# Patient Record
Sex: Female | Born: 1945 | Race: Black or African American | Hispanic: No | Smoking: Current every day smoker
Health system: Southern US, Community
[De-identification: ages and names within clinical notes are randomized; demographics above are authoritative.]

## PROBLEM LIST (undated history)

## (undated) DIAGNOSIS — I10 Essential (primary) hypertension: Secondary | ICD-10-CM

## (undated) DIAGNOSIS — E119 Type 2 diabetes mellitus without complications: Secondary | ICD-10-CM

## (undated) HISTORY — PX: SKIN GRAFT: SHX250

## (undated) NOTE — *Deleted (*Deleted)
[facility]  Name: Gabriella Acosta  MRN: [id number] DOB: 20-Dec-1945  Chief Complaint/ HPI  Barrie Dunker,  28 y.o. , female presents for their Follow-Up CCM visit with the clinical pharmacist via telephone due to COVID-19 Pandemic.  PCP : [name], FNP  Their chronic conditions include: Hypertension, Hyperlipidemia, Diabetes and Anxiety  Office Visits:  10/20/19 OV: Severely elevated A1c at 14.0.  No medication changes made as of this time.  She is not participating in exercise and not following any sort of diet plan.  07/30/19 AWV and OV: HTN/DM follow up. Does not check BG at home. Refused colonoscopy and Cologuard. Interested in pneumonia vaccine. Referred to Ophthalmology. Labs ordered (HgbA1c, CMP14+EGFR, CBC, TB, lipid panel). Encouraged exercise. Continue current meds. Given prescription for PCV13. Atorvastatin increased from 10mg  daily to 80mg  daily.   Consult Visits:  CCM Encounters: 03/27/19 PharmD: Samples of Januvia provided on 3/22. Attestation form mailed back without application so unable to process, incoming pharmacy team to take over. Rosuvastatin switched to atorvastatin due to cost.   02/18/19 CPhT: Confirmed [employer] received patient application for Januvia patient assistance. Waiting on attestation form that was mailed to patient on 02/03/19. Per PharmD, pt has mailed attestation form back to [employer]. F/U in 10-14 days  02/17/19 PharmD: Medicaid assistance denied. Assisted patient with PAP applications for Januvia and Basaglar.   02/11/19 CPhT: Basaglar patient assistance approved through [employer] until 01/09/20  Medications: Outpatient Encounter Medications as of 11/26/2019  Medication Sig Note  . amLODipine (NORVASC) 5 MG tablet TAKE 1 TABLET(5 MG) BY MOUTH DAILY   . atorvastatin (LIPITOR) 80 MG tablet Take 1 tablet (80 mg total) by mouth daily.   . Blood Glucose Monitoring Suppl DEVI Check blood sugars twice daily. E11.9   .  Continuous Blood Gluc Sensor (FREESTYLE LIBRE 2 SENSOR) MISC Use to check blood sugars daily   . diclofenac sodium (VOLTAREN) 1 % GEL Apply 2 g topically 4 (four) times daily.   . fluconazole (DIFLUCAN) 100 MG tablet Take 1 tablet by mouth today then repeat in 5 days.   [name] glucose blood (ACCU-CHEK GUIDE) test strip Use as instructed   . insulin aspart (FIASP FLEXTOUCH) 100 UNIT/ML FlexTouch Pen Inject into the skin. Use as directed per sliding scale three times daily   . Insulin Glargine (BASAGLAR KWIKPEN) 100 UNIT/ML Inject 30 Units into the skin at bedtime. 03/31/2019: [employer] patient assistance  . Insulin Pen Needle (B-D UF III MINI PEN NEEDLES) 31G X 5 MM MISC USE AS DIRECTED DAILY   . Lancets (ACCU-CHEK SOFT TOUCH) lancets Use as instructed   . magnesium 30 MG tablet Take 30 mg by mouth once a week.   . meloxicam (MOBIC) 15 MG tablet TK 1 T PO QD AS NEEDED WITH FOOD   . niacin (NIASPAN) 500 MG CR tablet Take 1 tablet (500 mg total) by mouth at bedtime.   . potassium chloride (KLOR-CON 10) 10 MEQ tablet Take 1 tablet (10 mEq total) by mouth daily.   . sitaGLIPtin (JANUVIA) 100 MG tablet Take 1 tablet (100 mg total) by mouth daily.   [name] telmisartan (MICARDIS) 20 MG tablet TAKE 1 TABLET(20 MG) BY MOUTH DAILY    No facility-administered encounter medications on file as of 11/26/2019.    Current Diagnosis/Assessment:    Goals Addressed   None     Diabetes   A1c goal <7%  Recent Relevant Labs: Lab Results  Component Value Date/Time   HGBA1C 14.0 (  H) 10/20/2019 10:22 AM   HGBA1C 10.4 (H) 07/30/2019 03:33 PM   MICROALBUR 30 12/10/2018 05:09 PM   MICROALBUR 150 10/31/2017 12:47 PM   Kidney Function Lab Results  Component Value Date/Time   CREATININE 0.67 10/20/2019 10:22 AM   CREATININE 0.59 07/30/2019 03:33 PM   GFRNONAA 88 10/20/2019 10:22 AM   GFRAA 102 10/20/2019 10:22 AM   K 3.3 (L) 10/20/2019 10:22 AM   K 3.5 07/30/2019 03:33 PM   Last diabetic Eye exam:  Lab Results   Component Value Date/Time   HMDIABEYEEXA No Retinopathy 09/02/2019 12:00 AM    Last diabetic Foot exam: 12/10/18  Checking BG: Sometimes Daily  Recent FBG Readings: unavailable  Patient has failed these meds in past: Tresiba, Tradjenta, Ozempic  Patient is currently uncontrolled on the following medications: . Basaglar Kwikpen 30 units at bedtime . Januvia 100mg  daily . Fiasp sliding scale - not currently taking  We discussed:  . Diet extensively o She says she does not eat much, and when she eats it is usually chicken or a hamburger o After further discussion she states she loves bread and eats a lot of it . She has not been checking her blood sugar or using her mealtime insulin because she has been almost out of it and she has not been able to do sliding scale since not checking her sugars o She did report that she has her Freestyle sensor now, just has not put it on and plans to do so today o We discussed her elevated A1c and the need to make changes to her diet including limiting her carbohydrates and sweets . Advised her to check her blood sugars with sensors starting today . Will consult with CCM team to get Fiasp samples and begin PAP so that she is not without insulin . Discussed risk of complications with continued uncontrolled blood sugar.  Plan Continue current medications, Fiasp PAP  Hyperlipidemia   LDL goal < 70  Lipid Panel     Component Value Date/Time   CHOL 257 (H) 07/30/2019 1533   TRIG 863 (HH) 07/30/2019 1533   HDL 34 (L) 07/30/2019 1533   LDLCALC Comment (A) 07/30/2019 1533    Hepatic Function Latest Ref Rng & Units 10/20/2019 07/30/2019 02/04/2019  Total Protein 6.0 - 8.5 g/dL 7.7 8.0 7.3  Albumin 3.7 - 4.7 g/dL 4.5 4.5 4.4  AST 0 - 40 IU/L 18 22 14   ALT 0 - 32 IU/L 21 18 11   Alk Phosphatase 44 - 121 IU/L 185(H) 179(H) 171(H)  Total Bilirubin 0.0 - 1.2 mg/dL 0.3 0.2 <1.1    The 91-YNWG ASCVD risk score Denman George DC Jr., et al., 2013) is: 47.7%    Values used to calculate the score:     Age: 36 years     Sex: Female     Is Non-Hispanic African American: No     Diabetic: Yes     Tobacco smoker: Yes     Systolic Blood Pressure: 136 mmHg     Is BP treated: Yes     HDL Cholesterol: 34 mg/dL     Total Cholesterol: 257 mg/dL   Patient has failed these meds in past: Rosuvastatin Patient is currently uncontrolled on the following medications:  . Atorvastatin 80mg  daily  . Niacin CR 500mg  (NEW) - has not picked up from pharmacy yet  We discussed:  Her TG's are severely elevated, she has not picked up the Niacin recommended yet.  Encouraged her to pick this up.  We discussed diet and risk of elevated LDL and TG's.  Plan Continue current medications, recheck lipid panel    Willa Frater, PharmD Clinical Pharmacist [facility] [phone]

---

## 1997-02-10 ENCOUNTER — Other Ambulatory Visit: Admission: RE | Admit: 1997-02-10 | Discharge: 1997-02-10 | Payer: Self-pay | Admitting: Obstetrics

## 1998-03-12 ENCOUNTER — Emergency Department (HOSPITAL_COMMUNITY): Admission: EM | Admit: 1998-03-12 | Discharge: 1998-03-12 | Payer: Self-pay | Admitting: Emergency Medicine

## 1998-03-12 ENCOUNTER — Encounter: Payer: Self-pay | Admitting: Emergency Medicine

## 1998-04-11 ENCOUNTER — Encounter: Payer: Self-pay | Admitting: Emergency Medicine

## 1998-04-11 ENCOUNTER — Emergency Department (HOSPITAL_COMMUNITY): Admission: EM | Admit: 1998-04-11 | Discharge: 1998-04-11 | Payer: Self-pay | Admitting: Emergency Medicine

## 2000-08-20 ENCOUNTER — Encounter: Payer: Self-pay | Admitting: Emergency Medicine

## 2000-08-20 ENCOUNTER — Emergency Department (HOSPITAL_COMMUNITY): Admission: EM | Admit: 2000-08-20 | Discharge: 2000-08-20 | Payer: Self-pay | Admitting: Emergency Medicine

## 2001-11-28 ENCOUNTER — Emergency Department (HOSPITAL_COMMUNITY): Admission: EM | Admit: 2001-11-28 | Discharge: 2001-11-28 | Payer: Self-pay | Admitting: Emergency Medicine

## 2003-05-09 ENCOUNTER — Emergency Department (HOSPITAL_COMMUNITY): Admission: EM | Admit: 2003-05-09 | Discharge: 2003-05-09 | Payer: Self-pay | Admitting: Emergency Medicine

## 2004-06-12 ENCOUNTER — Emergency Department (HOSPITAL_COMMUNITY): Admission: EM | Admit: 2004-06-12 | Discharge: 2004-06-12 | Payer: Self-pay | Admitting: Emergency Medicine

## 2005-04-12 ENCOUNTER — Ambulatory Visit (HOSPITAL_COMMUNITY): Admission: RE | Admit: 2005-04-12 | Discharge: 2005-04-12 | Payer: Self-pay | Admitting: Obstetrics

## 2007-03-14 ENCOUNTER — Emergency Department (HOSPITAL_COMMUNITY): Admission: EM | Admit: 2007-03-14 | Discharge: 2007-03-14 | Payer: Self-pay | Admitting: Emergency Medicine

## 2010-05-13 ENCOUNTER — Emergency Department (HOSPITAL_COMMUNITY)
Admission: EM | Admit: 2010-05-13 | Discharge: 2010-05-13 | Disposition: A | Discharge status: Home / Self Care | Payer: Self-pay | Attending: Emergency Medicine | Admitting: Emergency Medicine

## 2010-05-13 ENCOUNTER — Emergency Department (HOSPITAL_COMMUNITY): Payer: Self-pay

## 2010-05-13 DIAGNOSIS — S335XXA Sprain of ligaments of lumbar spine, initial encounter: Secondary | ICD-10-CM | POA: Insufficient documentation

## 2010-05-13 DIAGNOSIS — M545 Low back pain, unspecified: Secondary | ICD-10-CM | POA: Insufficient documentation

## 2010-05-13 DIAGNOSIS — X58XXXA Exposure to other specified factors, initial encounter: Secondary | ICD-10-CM | POA: Insufficient documentation

## 2010-06-02 ENCOUNTER — Emergency Department (HOSPITAL_COMMUNITY)
Admission: EM | Admit: 2010-06-02 | Discharge: 2010-06-02 | Disposition: A | Discharge status: Home / Self Care | Payer: No Typology Code available for payment source | Attending: Emergency Medicine | Admitting: Emergency Medicine

## 2010-06-02 DIAGNOSIS — S239XXA Sprain of unspecified parts of thorax, initial encounter: Secondary | ICD-10-CM | POA: Insufficient documentation

## 2010-06-02 DIAGNOSIS — M546 Pain in thoracic spine: Secondary | ICD-10-CM | POA: Insufficient documentation

## 2010-06-02 DIAGNOSIS — M545 Low back pain, unspecified: Secondary | ICD-10-CM | POA: Insufficient documentation

## 2010-06-02 DIAGNOSIS — S335XXA Sprain of ligaments of lumbar spine, initial encounter: Secondary | ICD-10-CM | POA: Insufficient documentation

## 2010-06-05 ENCOUNTER — Ambulatory Visit (INDEPENDENT_AMBULATORY_CARE_PROVIDER_SITE_OTHER): Payer: No Typology Code available for payment source

## 2010-06-05 ENCOUNTER — Inpatient Hospital Stay (INDEPENDENT_AMBULATORY_CARE_PROVIDER_SITE_OTHER)
Admission: RE | Admit: 2010-06-05 | Discharge: 2010-06-05 | Disposition: A | Discharge status: Home / Self Care | Payer: No Typology Code available for payment source | Source: Ambulatory Visit | Attending: Emergency Medicine | Admitting: Emergency Medicine

## 2010-06-05 DIAGNOSIS — S335XXA Sprain of ligaments of lumbar spine, initial encounter: Secondary | ICD-10-CM

## 2010-06-05 DIAGNOSIS — M62838 Other muscle spasm: Secondary | ICD-10-CM

## 2010-06-05 DIAGNOSIS — N390 Urinary tract infection, site not specified: Secondary | ICD-10-CM

## 2010-06-05 LAB — POCT I-STAT, CHEM 8
BUN: 6 mg/dL (ref 6–23)
Calcium, Ion: 1.19 mmol/L (ref 1.12–1.32)
Creatinine, Ser: 0.7 mg/dL (ref 0.4–1.2)
TCO2: 28 mmol/L (ref 0–100)

## 2010-06-05 LAB — POCT URINALYSIS DIP (DEVICE)
Ketones, ur: NEGATIVE mg/dL
Protein, ur: NEGATIVE mg/dL
pH: 5.5 (ref 5.0–8.0)

## 2010-10-03 LAB — I-STAT 8, (EC8 V) (CONVERTED LAB)
Acid-base deficit: 1
TCO2: 26
pCO2, Ven: 43.3 — ABNORMAL LOW
pH, Ven: 7.358 — ABNORMAL HIGH

## 2010-10-03 LAB — DIFFERENTIAL
Basophils Relative: 0
Eosinophils Absolute: 0.1
Neutrophils Relative %: 77

## 2010-10-03 LAB — CBC
MCHC: 34.8
MCV: 88.1
Platelets: 214

## 2010-10-03 LAB — POCT I-STAT CREATININE
Creatinine, Ser: 0.6
Operator id: 288331

## 2010-10-03 LAB — POCT CARDIAC MARKERS
Operator id: 288331
Troponin i, poc: <0.05

## 2011-01-31 ENCOUNTER — Emergency Department (HOSPITAL_COMMUNITY)
Admission: EM | Admit: 2011-01-31 | Discharge: 2011-01-31 | Disposition: A | Discharge status: Home / Self Care | Payer: Self-pay | Attending: Emergency Medicine | Admitting: Emergency Medicine

## 2011-01-31 ENCOUNTER — Encounter (HOSPITAL_COMMUNITY): Payer: Self-pay | Admitting: Emergency Medicine

## 2011-01-31 DIAGNOSIS — Z79899 Other long term (current) drug therapy: Secondary | ICD-10-CM | POA: Insufficient documentation

## 2011-01-31 DIAGNOSIS — R040 Epistaxis: Secondary | ICD-10-CM | POA: Insufficient documentation

## 2011-01-31 DIAGNOSIS — I10 Essential (primary) hypertension: Secondary | ICD-10-CM | POA: Insufficient documentation

## 2011-01-31 MED ORDER — HYDROCHLOROTHIAZIDE 25 MG PO TABS: 25.0000 mg | ORAL_TABLET | Freq: Every day | ORAL | Status: DC

## 2011-01-31 MED ORDER — OXYMETAZOLINE HCL 0.05 % NA SOLN: 2.0000 | Freq: Two times a day (BID) | NASAL | Status: AC | PRN

## 2011-01-31 NOTE — ED Notes (Signed)
Nosebleed controlled. No bleeding at this time

## 2011-01-31 NOTE — ED Notes (Signed)
EDP at bedside  

## 2011-01-31 NOTE — ED Notes (Signed)
Pt hypertensive. Alert and oriented. NAD. Will recheck vitals

## 2011-01-31 NOTE — ED Notes (Signed)
Pt here with nosebleed today; pt sts on and off starting today; pt denies anticoagulants; pt denies hx of hypertension but is hypertensive at present; pt sts passing clots today

## 2011-02-01 NOTE — ED Provider Notes (Signed)
History     CSN: [id number]  Arrival date & time 01/31/11  1553   First MD Initiated Contact with Patient 01/31/11 1603      Chief Complaint  Patient presents with  . Epistaxis  . Hypertension     The history is provided by the patient.   patient reports intermittent left nosebleed that occurred today.  She reports the bleeding seems to be controlled at this time.  She denies use of anticoagulants.  She reports no prior nosebleeds.  She does report that she was told to have blood pressure by year ago but it was thought to be at that time secondary to stress and it was to be rechecked.  She's never had her blood pressure rechecked.  Her blood pressure on arrival to the emergency department was 216/114.  She denies chest pain or shortness breath.  Nothing worsens her symptoms.  Her symptoms have been improved by direct pressure.    History reviewed. No pertinent past medical history.  History reviewed. No pertinent past surgical history.  History reviewed. No pertinent family history.  History  Substance Use Topics  . Smoking status: Current Everyday Smoker  . Smokeless tobacco: Not on file  . Alcohol Use: Yes    OB History    Grav Para Term Preterm Abortions TAB SAB Ect Mult Living                  Review of Systems  HENT: Positive for nosebleeds.   All other systems reviewed and are negative.    Allergies  Review of patient's allergies indicates no known allergies.  Home Medications   Current Outpatient Rx  Name Route Sig Dispense Refill  . HYDROCHLOROTHIAZIDE 25 MG PO TABS Oral Take 1 tablet (25 mg total) by mouth daily. 30 tablet 2  . OXYMETAZOLINE HCL 0.05 % NA SOLN Nasal Place 2 sprays into the nose 2 (two) times daily as needed (nose bleed). 30 mL 0    BP 159/89  Pulse 82  Temp(Src) 98.2 F (36.8 C) (Oral)  Resp 18  SpO2 98%  Physical Exam  Nursing note and vitals reviewed. Constitutional: She is oriented to person, place, and time. She appears  well-developed and well-nourished. No distress.  HENT:  Head: Normocephalic and atraumatic.       Dried blood in the left narrow without active bleeding.  No active bleeding from her right narrow.  Posterior pharynx without evidence of bleeding.  Airway is patent.  Tolerating secretions.  Eyes: EOM are normal.  Neck: Normal range of motion.  Pulmonary/Chest: Effort normal.  Abdominal: She exhibits no distension. There is no tenderness.  Neurological: She is alert and oriented to person, place, and time.  Skin: Skin is warm.  Psychiatric: She has a normal mood and affect. Judgment normal.    ED Course  Procedures (including critical care time)  Labs Reviewed - No data to display No results found.   1. Epistaxis   2. Hypertension       MDM  Her bleeding was controlled with direct pressure.  There is no obvious left nasal epistaxis at this time.  She has no evidence of a posterior nasal bleed.  Ice was applied to her face.  She was observed in the emergency department.  Her blood pressure was elevated on arrival however without treatment it improved.  I do suspect she has some degree of hypertension and thus the patient was started on hydrochlorothiazide she'll follow closely with her primary care  Dr.        Lyanne Co, MD 02/01/11 [phone]

## 2012-04-22 ENCOUNTER — Emergency Department (HOSPITAL_COMMUNITY)
Admission: EM | Admit: 2012-04-22 | Discharge: 2012-04-22 | Disposition: A | Discharge status: Home / Self Care | Payer: PRIVATE HEALTH INSURANCE | Attending: Emergency Medicine | Admitting: Emergency Medicine

## 2012-04-22 ENCOUNTER — Encounter (HOSPITAL_COMMUNITY): Payer: Self-pay

## 2012-04-22 DIAGNOSIS — Z79899 Other long term (current) drug therapy: Secondary | ICD-10-CM | POA: Insufficient documentation

## 2012-04-22 DIAGNOSIS — R040 Epistaxis: Secondary | ICD-10-CM | POA: Insufficient documentation

## 2012-04-22 DIAGNOSIS — J309 Allergic rhinitis, unspecified: Secondary | ICD-10-CM | POA: Insufficient documentation

## 2012-04-22 DIAGNOSIS — F17200 Nicotine dependence, unspecified, uncomplicated: Secondary | ICD-10-CM | POA: Insufficient documentation

## 2012-04-22 DIAGNOSIS — J3489 Other specified disorders of nose and nasal sinuses: Secondary | ICD-10-CM | POA: Insufficient documentation

## 2012-04-22 LAB — BASIC METABOLIC PANEL
CO2: 25 mEq/L (ref 19–32)
Chloride: 101 mEq/L (ref 96–112)
Creatinine, Ser: 0.59 mg/dL (ref 0.50–1.10)
Potassium: 4.4 mEq/L (ref 3.5–5.1)

## 2012-04-22 LAB — CBC WITH DIFFERENTIAL/PLATELET
Basophils Absolute: 0.1 10*3/uL (ref 0.0–0.1)
HCT: 39 % (ref 36.0–46.0)
Hemoglobin: 13.8 g/dL (ref 12.0–15.0)
Lymphocytes Relative: 21 % (ref 12–46)
Monocytes Absolute: 0.8 10*3/uL (ref 0.1–1.0)
Neutro Abs: 9.5 10*3/uL — ABNORMAL HIGH (ref 1.7–7.7)
RDW: 13.7 % (ref 11.5–15.5)
WBC: 13.4 10*3/uL — ABNORMAL HIGH (ref 4.0–10.5)

## 2012-04-22 NOTE — ED Notes (Signed)
Intermittent nose bleed since yesterday,  Began yesterday morning bled for 10 minutes and  Then pt. Got it to stope , again last night for about 10 minutes.  Twice today.   Not active bleeding noted at present time,

## 2012-04-22 NOTE — ED Provider Notes (Signed)
History    This chart was scribed for non-physician practitioner Junious Silk, PA-C working with Gwyneth Sprout, MD by Gerlean Ren, ED Scribe. This patient was seen in room TR09C/TR09C and the patient's care was started at 3:35 PM.   CSN: [id number]  Arrival date & time 04/22/12  1320   None     Chief Complaint  Patient presents with  . Epistaxis     The history is provided by the patient. No language interpreter was used.  Gabriella Acosta is a 67 y.o. female who presents to the Emergency Department complaining of epistaxis with sudden onset and constant for the past 15-20 minutes that has currently resolved.  Pt reports epistaxis first onset yesterday morning that has intermittently started and resolved lasting between 10-20 minutes each time, approximately 4 separate episodes since first onset.  Pt reports h/o epistaxis last year for which she received medical attention but had no long-term treatment.  Pt reports that she was given saline spray last time but reports that she had a negative reaction and was told not to use the spray again.  Pt denies nausea, emesis, cough, fever, fatigue, shortness of breath.  Pt reports seasonal allergies at this time causing some congestion for which pt has been blowing her nose very frequently.  Blood has drained into throat causing pt to spit out blood.  No blood thinners.   History reviewed. No pertinent past medical history.  History reviewed. No pertinent past surgical history.  No family history on file.  History  Substance Use Topics  . Smoking status: Current Every Day Smoker  . Smokeless tobacco: Not on file  . Alcohol Use: Yes     Comment: occassional    No OB history provided.   Review of Systems  Constitutional: Negative for fatigue.  HENT: Positive for nosebleeds and congestion.   Respiratory: Negative for shortness of breath.   Gastrointestinal: Negative for nausea and vomiting.  All other systems reviewed and are  negative.    Allergies  Review of patient's allergies indicates no known allergies.  Home Medications   Current Outpatient Rx  Name  Route  Sig  Dispense  Refill  . cetirizine (ZYRTEC) 10 MG tablet   Oral   Take 10 mg by mouth daily.         . Multiple Vitamins-Minerals (MULTIVITAMINS THER. W/MINERALS) TABS   Oral   Take 1 tablet by mouth daily.           BP 177/92  Pulse 94  Temp(Src) 98.4 F (36.9 C) (Oral)  Resp 20  SpO2 98%  Physical Exam  Nursing note and vitals reviewed. Constitutional: She is oriented to person, place, and time. She appears well-developed and well-nourished. No distress.  HENT:  Head: Normocephalic and atraumatic.  Right Ear: External ear normal.  Left Ear: External ear normal.  Mouth/Throat: Oropharynx is clear and moist.  Bilateral TM normal, blood in bilateral nares  Eyes: Conjunctivae are normal. Pupils are equal, round, and reactive to light.  Neck: Normal range of motion.  Cardiovascular: Normal rate, regular rhythm and normal heart sounds.   Pulmonary/Chest: Effort normal and breath sounds normal. No stridor. No respiratory distress. She has no wheezes. She has no rales.  Abdominal: Soft. She exhibits no distension.  Musculoskeletal: Normal range of motion.  Neurological: She is alert and oriented to person, place, and time. She has normal strength.  Skin: Skin is warm and dry. She is not diaphoretic. No erythema.  Psychiatric: She has a  normal mood and affect. Her behavior is normal.    ED Course  Procedures (including critical care time) DIAGNOSTIC STUDIES: Oxygen Saturation is 98% on room air, normal by my interpretation.    COORDINATION OF CARE: 3:41 PM- Discussed orthostatic blood labs. Discussed the use of saline spray for her nose.  Pt understands and agrees with plan.    Labs Reviewed  CBC WITH DIFFERENTIAL - Abnormal; Notable for the following:    WBC 13.4 (*)    Neutro Abs 9.5 (*)    All other components within  normal limits  BASIC METABOLIC PANEL      1. Epistaxis       MDM  Patient presents for recurrent nosebleeds. Hemodynamically stable. No anemia. Orthostatic vital signs normal. Discussed normal saline to moisten nose. She has been congested and blowing nose more. Discussed using an OTC decongestant to hasten rhinorrhea relief. Return precautions given. Vital signs stable for discharge. Patient / Family / Caregiver informed of clinical course, understand medical decision-making process, and agree with plan.      I personally performed the services described in this documentation, which was scribed in my presence. The recorded information has been reviewed and is accurate.    Mora Bellman, PA-C 04/23/12 [phone]

## 2012-04-22 NOTE — ED Notes (Signed)
Some bleeding at present. Pt not holding adequate pressure, just has cloth at the end of her nose. Instructed to hold pressure and not let go for at least 15 minutes.

## 2012-04-23 NOTE — ED Provider Notes (Signed)
Medical screening examination/treatment/procedure(s) were performed by non-physician practitioner and as supervising physician I was immediately available for consultation/collaboration.   Gwyneth Sprout, MD 04/23/12 2149

## 2013-01-25 ENCOUNTER — Encounter (HOSPITAL_COMMUNITY): Payer: Self-pay | Admitting: Emergency Medicine

## 2013-01-25 ENCOUNTER — Emergency Department (HOSPITAL_COMMUNITY): Payer: PRIVATE HEALTH INSURANCE

## 2013-01-25 ENCOUNTER — Emergency Department (HOSPITAL_COMMUNITY)
Admission: EM | Admit: 2013-01-25 | Discharge: 2013-01-25 | Disposition: A | Discharge status: Home / Self Care | Payer: PRIVATE HEALTH INSURANCE | Attending: Emergency Medicine | Admitting: Emergency Medicine

## 2013-01-25 DIAGNOSIS — W19XXXA Unspecified fall, initial encounter: Secondary | ICD-10-CM

## 2013-01-25 DIAGNOSIS — IMO0002 Reserved for concepts with insufficient information to code with codable children: Secondary | ICD-10-CM | POA: Insufficient documentation

## 2013-01-25 DIAGNOSIS — W268XXA Contact with other sharp object(s), not elsewhere classified, initial encounter: Secondary | ICD-10-CM | POA: Insufficient documentation

## 2013-01-25 DIAGNOSIS — S01409A Unspecified open wound of unspecified cheek and temporomandibular area, initial encounter: Secondary | ICD-10-CM | POA: Insufficient documentation

## 2013-01-25 DIAGNOSIS — S199XXA Unspecified injury of neck, initial encounter: Secondary | ICD-10-CM

## 2013-01-25 DIAGNOSIS — F17200 Nicotine dependence, unspecified, uncomplicated: Secondary | ICD-10-CM | POA: Insufficient documentation

## 2013-01-25 DIAGNOSIS — Y9301 Activity, walking, marching and hiking: Secondary | ICD-10-CM | POA: Insufficient documentation

## 2013-01-25 DIAGNOSIS — Z79899 Other long term (current) drug therapy: Secondary | ICD-10-CM | POA: Insufficient documentation

## 2013-01-25 DIAGNOSIS — Y92009 Unspecified place in unspecified non-institutional (private) residence as the place of occurrence of the external cause: Secondary | ICD-10-CM | POA: Insufficient documentation

## 2013-01-25 DIAGNOSIS — S6990XA Unspecified injury of unspecified wrist, hand and finger(s), initial encounter: Secondary | ICD-10-CM | POA: Insufficient documentation

## 2013-01-25 DIAGNOSIS — S0180XA Unspecified open wound of other part of head, initial encounter: Secondary | ICD-10-CM | POA: Insufficient documentation

## 2013-01-25 DIAGNOSIS — I10 Essential (primary) hypertension: Secondary | ICD-10-CM | POA: Insufficient documentation

## 2013-01-25 DIAGNOSIS — S01501A Unspecified open wound of lip, initial encounter: Secondary | ICD-10-CM | POA: Insufficient documentation

## 2013-01-25 DIAGNOSIS — S0181XA Laceration without foreign body of other part of head, initial encounter: Secondary | ICD-10-CM

## 2013-01-25 DIAGNOSIS — R296 Repeated falls: Secondary | ICD-10-CM | POA: Insufficient documentation

## 2013-01-25 DIAGNOSIS — Z23 Encounter for immunization: Secondary | ICD-10-CM | POA: Insufficient documentation

## 2013-01-25 DIAGNOSIS — S0993XA Unspecified injury of face, initial encounter: Secondary | ICD-10-CM | POA: Insufficient documentation

## 2013-01-25 DIAGNOSIS — M79643 Pain in unspecified hand: Secondary | ICD-10-CM

## 2013-01-25 HISTORY — DX: Essential (primary) hypertension: I10

## 2013-01-25 MED ORDER — TRAMADOL HCL 50 MG PO TABS
50.0000 mg | ORAL_TABLET | Freq: Four times a day (QID) | ORAL | Status: DC | PRN
Start: 2013-01-25 — End: 2017-10-31

## 2013-01-25 MED ORDER — TRAMADOL HCL 50 MG PO TABS
50.0000 mg | ORAL_TABLET | Freq: Once | ORAL | Status: AC
  Administered 2013-01-25: 50 mg via ORAL
  Filled 2013-01-25: qty 1

## 2013-01-25 MED ORDER — TETANUS-DIPHTH-ACELL PERTUSSIS 5-2.5-18.5 LF-MCG/0.5 IM SUSP
0.5000 mL | Freq: Once | INTRAMUSCULAR | Status: AC
  Administered 2013-01-25: 0.5 mL via INTRAMUSCULAR
  Filled 2013-01-25: qty 0.5

## 2013-01-25 NOTE — ED Notes (Signed)
Patient was outside grilling and when she was walking to go inside one of the nails in the deck made her fall

## 2013-01-25 NOTE — ED Notes (Signed)
Patient transported to X-ray 

## 2013-01-25 NOTE — ED Notes (Signed)
Patient has 3 lacerations to her face.  1 at the lip, 1 left cheek and 1 to the left temple.  Denies LOC

## 2013-01-25 NOTE — ED Notes (Signed)
Patient also has abrasions to the left hand and left knee  Ice applied to the face

## 2013-01-25 NOTE — ED Provider Notes (Signed)
CSN: [id number]     Arrival date & time 01/25/13  2016 History  This chart was scribed for Santiago Glad, PA, working with Gavin Pound. Oletta Lamas, MD, by Ardelia Mems ED Scribe. This patient was seen in room TR10C/TR10C and the patient's care was started at 8:51 PM.   Chief Complaint  Patient presents with  . Facial Laceration    The history is provided by the patient. No language interpreter was used.    HPI Comments: Gabriella Acosta is a 68 y.o. female who presents to the Emergency Department complaining of lacerations to her left cheek, left temple and upper lip sustained earlier today. She states that she sustained these lacerations when she tripped after her pants were caught on a  raised nail on her deck at home. She states that she was carrying a glass tray which broke into many pieces which she then landed on when she fell on her outstretched arms and her face. She denies LOC pertaining to the fall. She states that she has been able to ambulate normally since the fall. She states that she is not on any blood thinning medications. In addition to her lacerations, she is complaining of left-sided facial pain and left hand pain onset after the fall. She denies nausea, emesis, visual disturbances, neck pain, back pain, extremity pain or any other pain or symptoms. She states that she had 4 beers tonight, but that she does not feel intoxicated.    Past Medical History  Diagnosis Date  . Hypertension    History reviewed. No pertinent past surgical history. History reviewed. No pertinent family history. History  Substance Use Topics  . Smoking status: Current Every Day Smoker  . Smokeless tobacco: Not on file  . Alcohol Use: Yes     Comment: occassional   OB History   Grav Para Term Preterm Abortions TAB SAB Ect Mult Living                 Review of Systems  HENT:       Left-sided facial pain  Eyes: Negative for visual disturbance.  Gastrointestinal: Negative for nausea and  vomiting.  Musculoskeletal: Negative for back pain, gait problem and neck pain.       Left hand pain  Skin: Positive for wound.  Neurological: Negative for syncope.  All other systems reviewed and are negative.   Allergies  Review of patient's allergies indicates no known allergies.  Home Medications   Current Outpatient Rx  Name  Route  Sig  Dispense  Refill  . naproxen (NAPROSYN) 500 MG tablet   Oral   Take 500 mg by mouth daily as needed (pain).         [name] PRESCRIPTION MEDICATION   Oral   Take 1 tablet by mouth daily. For blood pressure         . PRESCRIPTION MEDICATION   Oral   Take 1 tablet by mouth daily. Heart medication          Triage Vitals: BP 143/81  Pulse 134  Temp(Src) 97 F (36.1 C) (Oral)  Resp 20  Ht 5\' 4"  (1.626 m)  Wt 143 lb 12.8 oz (65.227 kg)  BMI 24.67 kg/m2  SpO2 97%  Physical Exam  Nursing note and vitals reviewed. Constitutional: She is oriented to person, place, and time. She appears well-developed and well-nourished. No distress.  HENT:  Head: Normocephalic.  1 cm linear laceration of left side of upper lip, that appears to affect the vermillion  border. 5 mm superficial non gaping laceration of left lower cheek, just lateral to the mouth. 2 cm jagged flap laceration of the left temple area.  Eyes: EOM are normal. Pupils are equal, round, and reactive to light.  Neck: Neck supple. No tracheal deviation present.  Cardiovascular: Normal rate.   Pulmonary/Chest: Effort normal. No respiratory distress.  Musculoskeletal: Normal range of motion. She exhibits tenderness.  Tenderness to palpation along the left 4th metacarpal. Tenderness to palpation of the left wrist. Abrasion to the dorsal aspect of the left wrist and the 3rd and 4th MCPs. No obvious swelling or deformities. 2+ radial pulse. Distal sensation of all fingers of the left hand is intact. Able to ambulate. Full ROM of lower extremities. Small, superficial abrasion of left knee. No  tenderness to palpation of the left knee.   Neurological: She is alert and oriented to person, place, and time. She has normal strength. No cranial nerve deficit or sensory deficit. Gait normal.  Cranial nerves are intact.  Skin: Skin is warm and dry.  Psychiatric: She has a normal mood and affect. Her behavior is normal.    ED Course  Procedures (including critical care time)  DIAGNOSTIC STUDIES: Oxygen Saturation is 97% on RA, normal by my interpretation.    COORDINATION OF CARE: 8:59 PM- Discussed plan to obtain diagnostic radiology. Will update pt's Tetanus vaccination. Pt advised of plan for treatment and pt agrees.  Medications  Tdap (BOOSTRIX) injection 0.5 mL (0.5 mLs Intramuscular Given 01/25/13 2038)   Labs Review Labs Reviewed - No data to display Imaging Review Dg Wrist Complete Left  01/25/2013   CLINICAL DATA:  Fall.  Left wrist pain.  EXAM: LEFT WRIST - COMPLETE 3+ VIEW  COMPARISON:  None.  FINDINGS: The wrist is located. No acute bone or soft tissue abnormalities are evident. The radiocarpal joints are intact.  IMPRESSION: Negative wrist radiographs.   Electronically Signed   By: Gennette Pac M.D.   On: 01/25/2013 21:37   Dg Hand Complete Left  01/25/2013   CLINICAL DATA:  Fall.  Laceration over the dorsum of the hand.  EXAM: LEFT HAND - COMPLETE 3+ VIEW  COMPARISON:  Wrist radiographs of the same day.  FINDINGS: Minimal soft tissue swelling is present over the dorsal aspect of the MCP joints. There is no underlying fracture or radiopaque foreign body. No acute osseous abnormality is present.  IMPRESSION: 1. Mild soft tissue swelling over the dorsum of the MCP joints without an underlying fracture.   Electronically Signed   By: Gennette Pac M.D.   On: 01/25/2013 21:38    EKG Interpretation   None      LACERATION REPAIR Performed by: Santiago Glad Authorized by: Santiago Glad Consent: Verbal consent obtained. Risks and benefits: risks, benefits and  alternatives were discussed Consent given by: patient Patient identity confirmed: provided demographic data Prepped and Draped in normal sterile fashion Wound explored  Laceration Location: left temple  Laceration Length: 2 cm  No Foreign Bodies seen or palpated  Anesthesia: local infiltration  Local anesthetic: lidocaine 2% with epinephrine  Anesthetic total: 3 ml  Irrigation method: syringe Amount of cleaning: standard  Skin closure: 6-0 Nylon  Number of sutures: 6  Technique: simple interrupted  Patient tolerance: Patient tolerated the procedure well with no immediate complications.  LACERATION REPAIR Performed by: Santiago Glad Authorized by: Santiago Glad Consent: Verbal consent obtained. Risks and benefits: risks, benefits and alternatives were discussed Consent given by: patient Patient identity confirmed: provided demographic  data Prepped and Draped in normal sterile fashion Wound explored  Laceration Location: upper lip  Laceration Length: 1 cm  No Foreign Bodies seen or palpated  Anesthesia: local infiltration  Local anesthetic: lidocaine 2% with epinephrine  Anesthetic total: 2 ml  Irrigation method: syringe Amount of cleaning: standard  Skin closure: 6-O Nylon  Number of sutures: 4  Technique: Simple interrupted  Patient tolerance: Patient tolerated the procedure well with no immediate complications.   MDM  No diagnosis found. Patient presenting with facial lacerations and hand pain after a mechanical fall that occurred just prior to arrival.  No LOC.  Patient not on any blood thinning medications.  Normal neurological exam.  Therefore, do not feel that any imaging of head is indicated at this time.  Lacerations repaired with sutures.  Xray of hand and wrist negative.  Patient neurovascularly intact.  Patient ambulating without difficulty.  Patient stable for discharge.  Return precautions given.  I personally performed the services  described in this documentation, which was scribed in my presence. The recorded information has been reviewed and is accurate.      Santiago GladHeather Nanea Jared, PA-C 01/25/13 2351

## 2013-01-26 NOTE — ED Provider Notes (Signed)
Medical screening examination/treatment/procedure(s) were performed by non-physician practitioner and as supervising physician I was immediately available for consultation/collaboration.  EKG Interpretation   None         Kemoni Ortega Y. Verlan Grotz, MD 01/26/13 1917 

## 2013-01-30 ENCOUNTER — Emergency Department (INDEPENDENT_AMBULATORY_CARE_PROVIDER_SITE_OTHER)
Admission: EM | Admit: 2013-01-30 | Discharge: 2013-01-30 | Disposition: A | Discharge status: Home / Self Care | Payer: PRIVATE HEALTH INSURANCE | Source: Home / Self Care | Attending: Family Medicine | Admitting: Family Medicine

## 2013-01-30 DIAGNOSIS — Z4802 Encounter for removal of sutures: Secondary | ICD-10-CM

## 2013-01-30 NOTE — ED Provider Notes (Signed)
CSN: [id number]     Arrival date & time 01/30/13  1710 History   First MD Initiated Contact with Patient 01/30/13 1752     Chief Complaint  Patient presents with  . Suture / Staple Removal   (Consider location/radiation/quality/duration/timing/severity/associated sxs/prior Treatment) Patient is a 68 y.o. female presenting with suture removal. The history is provided by the patient. No language interpreter was used.  Suture / Staple Removal This is a new problem. The current episode started more than 1 week ago. The problem occurs constantly. The problem has not changed since onset.Nothing aggravates the symptoms. Nothing relieves the symptoms. She has tried nothing for the symptoms. The treatment provided no relief.   Pt here for suture removal Past Medical History  Diagnosis Date  . Hypertension    No past surgical history on file. No family history on file. History  Substance Use Topics  . Smoking status: Current Every Day Smoker  . Smokeless tobacco: Not on file  . Alcohol Use: Yes     Comment: occassional   OB History   Grav Para Term Preterm Abortions TAB SAB Ect Mult Living                 Review of Systems  Skin: Positive for wound.  All other systems reviewed and are negative.    Allergies  Review of patient's allergies indicates no known allergies.  Home Medications   Current Outpatient Rx  Name  Route  Sig  Dispense  Refill  . naproxen (NAPROSYN) 500 MG tablet   Oral   Take 500 mg by mouth daily as needed (pain).         [name]. PRESCRIPTION MEDICATION   Oral   Take 1 tablet by mouth daily. For blood pressure         . PRESCRIPTION MEDICATION   Oral   Take 1 tablet by mouth daily. Heart medication         . traMADol (ULTRAM) 50 MG tablet   Oral   Take 1 tablet (50 mg total) by mouth every 6 (six) hours as needed.   15 tablet   0    There were no vitals taken for this visit. Physical Exam  Nursing note and vitals reviewed. Constitutional: She  is oriented to person, place, and time. She appears well-developed and well-nourished.  HENT:  Head: Normocephalic and atraumatic.  Healing laceration left forehead and left upper lip  Neurological: She is alert and oriented to person, place, and time.  Skin: Skin is warm.  Psychiatric: She has a normal mood and affect.    ED Course  Procedures (including critical care time) Labs Review Labs Reviewed - No data to display Imaging Review No results found.  EKG Interpretation    Date/Time:    Ventricular Rate:    PR Interval:    QRS Duration:   QT Interval:    QTC Calculation:   R Axis:     Text Interpretation:            Pt had difficulty tolerating suture removal.  Sutures small and tight.  I attempted removal with loops.  Pt informed me she did not want me to remove sutures because I hurt her.  Another RN will try to remove.    MDM   1. Visit for suture removal        Elson AreasLeslie K Garlene Apperson, PA-C 01/30/13 1810  Lonia SkinnerLeslie K JacksonSofia, PA-C 01/30/13 1906  Lonia SkinnerLeslie K IonaSofia, [place]-C 01/30/13 1911

## 2013-01-30 NOTE — ED Provider Notes (Signed)
Medical screening examination/treatment/procedure(s) were performed by resident physician or non-physician practitioner and as supervising physician I was immediately available for consultation/collaboration.   Blaine Guiffre DOUGLAS MD.   Karan Inclan D Coltrane Tugwell, MD 01/30/13 2004 
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
## 2013-01-30 NOTE — ED Notes (Signed)
C/o suture removal from face and lip

## 2013-07-29 ENCOUNTER — Emergency Department (HOSPITAL_COMMUNITY)
Admission: EM | Admit: 2013-07-29 | Discharge: 2013-07-29 | Disposition: A | Discharge status: Other Acute Inpatient Hospital | Payer: 59 | Attending: Emergency Medicine | Admitting: Emergency Medicine

## 2013-07-29 ENCOUNTER — Encounter (HOSPITAL_COMMUNITY): Payer: Self-pay | Admitting: Emergency Medicine

## 2013-07-29 ENCOUNTER — Emergency Department (HOSPITAL_COMMUNITY): Payer: 59

## 2013-07-29 DIAGNOSIS — T22219A Burn of second degree of unspecified forearm, initial encounter: Secondary | ICD-10-CM | POA: Diagnosis not present

## 2013-07-29 DIAGNOSIS — Y92009 Unspecified place in unspecified non-institutional (private) residence as the place of occurrence of the external cause: Secondary | ICD-10-CM | POA: Diagnosis not present

## 2013-07-29 DIAGNOSIS — T2122XA Burn of second degree of abdominal wall, initial encounter: Secondary | ICD-10-CM | POA: Diagnosis not present

## 2013-07-29 DIAGNOSIS — Z23 Encounter for immunization: Secondary | ICD-10-CM | POA: Insufficient documentation

## 2013-07-29 DIAGNOSIS — Z79899 Other long term (current) drug therapy: Secondary | ICD-10-CM | POA: Insufficient documentation

## 2013-07-29 DIAGNOSIS — T2102XA Burn of unspecified degree of abdominal wall, initial encounter: Secondary | ICD-10-CM

## 2013-07-29 DIAGNOSIS — F1010 Alcohol abuse, uncomplicated: Secondary | ICD-10-CM | POA: Insufficient documentation

## 2013-07-29 DIAGNOSIS — R000 Tachycardia, unspecified: Secondary | ICD-10-CM | POA: Insufficient documentation

## 2013-07-29 DIAGNOSIS — X131XXA Other contact with steam and other hot vapors, initial encounter: Secondary | ICD-10-CM

## 2013-07-29 DIAGNOSIS — T24219A Burn of second degree of unspecified thigh, initial encounter: Secondary | ICD-10-CM | POA: Insufficient documentation

## 2013-07-29 DIAGNOSIS — I10 Essential (primary) hypertension: Secondary | ICD-10-CM | POA: Diagnosis not present

## 2013-07-29 DIAGNOSIS — T23009A Burn of unspecified degree of unspecified hand, unspecified site, initial encounter: Secondary | ICD-10-CM

## 2013-07-29 DIAGNOSIS — X12XXXA Contact with other hot fluids, initial encounter: Secondary | ICD-10-CM | POA: Insufficient documentation

## 2013-07-29 DIAGNOSIS — Y9389 Activity, other specified: Secondary | ICD-10-CM | POA: Insufficient documentation

## 2013-07-29 LAB — ETHANOL: Alcohol, Ethyl (B): 230 mg/dL — ABNORMAL HIGH (ref 0–11)

## 2013-07-29 LAB — PREPARE FRESH FROZEN PLASMA
UNIT DIVISION: 0
Unit division: 0

## 2013-07-29 LAB — CBC
HCT: 36.8 % (ref 36.0–46.0)
Hemoglobin: 12.4 g/dL (ref 12.0–15.0)
MCH: 30.9 pg (ref 26.0–34.0)
MCHC: 33.7 g/dL (ref 30.0–36.0)
MCV: 91.8 fL (ref 78.0–100.0)
PLATELETS: 201 10*3/uL (ref 150–400)
RBC: 4.01 MIL/uL (ref 3.87–5.11)
RDW: 13.4 % (ref 11.5–15.5)
WBC: 12.9 10*3/uL — AB (ref 4.0–10.5)

## 2013-07-29 LAB — TYPE AND SCREEN
ABO/RH(D): A POS
Antibody Screen: NEGATIVE
UNIT DIVISION: 0
Unit division: 0

## 2013-07-29 LAB — COMPREHENSIVE METABOLIC PANEL
ALBUMIN: 3.5 g/dL (ref 3.5–5.2)
ALT: 76 U/L — ABNORMAL HIGH (ref 0–35)
ANION GAP: 20 — AB (ref 5–15)
AST: 70 U/L — ABNORMAL HIGH (ref 0–37)
Alkaline Phosphatase: 112 U/L (ref 39–117)
BUN: 8 mg/dL (ref 6–23)
CO2: 17 mEq/L — ABNORMAL LOW (ref 19–32)
CREATININE: 0.57 mg/dL (ref 0.50–1.10)
Calcium: 8.8 mg/dL (ref 8.4–10.5)
Chloride: 99 mEq/L (ref 96–112)
GFR calc Af Amer: >90 mL/min (ref >90)
Glucose, Bld: 243 mg/dL — ABNORMAL HIGH (ref 70–99)
Potassium: 3.4 mEq/L — ABNORMAL LOW (ref 3.7–5.3)
Sodium: 136 mEq/L — ABNORMAL LOW (ref 137–147)
Total Bilirubin: <0.2 mg/dL — ABNORMAL LOW (ref 0.3–1.2)
Total Protein: 7.4 g/dL (ref 6.0–8.3)

## 2013-07-29 LAB — PROTIME-INR
INR: 1.09 (ref 0.00–1.49)
PROTHROMBIN TIME: 14.1 s (ref 11.6–15.2)

## 2013-07-29 LAB — CDS SEROLOGY

## 2013-07-29 LAB — ABO/RH: ABO/RH(D): A POS

## 2013-07-29 MED ORDER — ONDANSETRON HCL 4 MG/2ML IJ SOLN
4.0000 mg | Freq: Once | INTRAMUSCULAR | Status: AC
  Administered 2013-07-29: 4 mg via INTRAVENOUS

## 2013-07-29 MED ORDER — HYDROMORPHONE HCL PF 1 MG/ML IJ SOLN
1.0000 mg | INTRAMUSCULAR | Status: DC
  Administered 2013-07-29: 1 mg via INTRAVENOUS

## 2013-07-29 MED ORDER — HYDROMORPHONE HCL PF 1 MG/ML IJ SOLN
INTRAMUSCULAR | Status: AC
  Filled 2013-07-29: qty 1

## 2013-07-29 MED ORDER — LACTATED RINGERS IV SOLN
INTRAVENOUS | Status: DC
  Administered 2013-07-29: 22:00:00 via INTRAVENOUS

## 2013-07-29 MED ORDER — HYDROMORPHONE HCL PF 1 MG/ML IJ SOLN
1.0000 mg | Freq: Once | INTRAMUSCULAR | Status: AC
  Administered 2013-07-29: 1 mg via INTRAVENOUS

## 2013-07-29 MED ORDER — TETANUS-DIPHTH-ACELL PERTUSSIS 5-2.5-18.5 LF-MCG/0.5 IM SUSP
0.5000 mL | Freq: Once | INTRAMUSCULAR | Status: AC
  Administered 2013-07-29: 0.5 mL via INTRAMUSCULAR

## 2013-07-29 MED ORDER — LACTATED RINGERS IV BOLUS (SEPSIS)
1000.0000 mL | Freq: Once | INTRAVENOUS | Status: AC
  Administered 2013-07-29: 1000 mL via INTRAVENOUS

## 2013-07-29 MED ORDER — TETANUS-DIPHTH-ACELL PERTUSSIS 5-2.5-18.5 LF-MCG/0.5 IM SUSP
INTRAMUSCULAR | Status: AC
  Filled 2013-07-29: qty 0.5

## 2013-07-29 MED ORDER — FENTANYL CITRATE 0.05 MG/ML IJ SOLN
50.0000 ug | Freq: Once | INTRAMUSCULAR | Status: AC
  Administered 2013-07-29: 50 ug via INTRAVENOUS

## 2013-07-29 MED ORDER — ONDANSETRON HCL 4 MG/2ML IJ SOLN
INTRAMUSCULAR | Status: AC
  Filled 2013-07-29: qty 2

## 2013-07-29 MED ORDER — LACTATED RINGERS IV BOLUS (SEPSIS): 1000.0000 mL | Freq: Once | INTRAVENOUS | Status: DC

## 2013-07-29 NOTE — Progress Notes (Signed)
Chaplain responded to level 1 page.  Pt unavailable but chaplain did escort family to consultation A.  Thereafter, chaplain took family to be with pt at bedside.  Family seemed appreciative of chaplain presence.
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
## 2013-07-30 ENCOUNTER — Encounter (HOSPITAL_COMMUNITY): Payer: Self-pay | Admitting: Emergency Medicine

## 2013-07-30 DIAGNOSIS — F1010 Alcohol abuse, uncomplicated: Secondary | ICD-10-CM | POA: Insufficient documentation

## 2013-07-30 DIAGNOSIS — D72829 Elevated white blood cell count, unspecified: Secondary | ICD-10-CM | POA: Insufficient documentation

## 2013-07-30 DIAGNOSIS — R739 Hyperglycemia, unspecified: Secondary | ICD-10-CM | POA: Insufficient documentation

## 2013-07-30 DIAGNOSIS — R7401 Elevation of levels of liver transaminase levels: Secondary | ICD-10-CM | POA: Insufficient documentation

## 2013-07-30 DIAGNOSIS — F419 Anxiety disorder, unspecified: Secondary | ICD-10-CM | POA: Insufficient documentation

## 2013-07-30 DIAGNOSIS — E440 Moderate protein-calorie malnutrition: Secondary | ICD-10-CM | POA: Insufficient documentation

## 2013-07-30 DIAGNOSIS — R748 Abnormal levels of other serum enzymes: Secondary | ICD-10-CM | POA: Insufficient documentation
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
## 2013-09-09 DIAGNOSIS — T2132XA Burn of third degree of abdominal wall, initial encounter: Secondary | ICD-10-CM | POA: Insufficient documentation

## 2013-09-09 DIAGNOSIS — T2122XD Burn of second degree of abdominal wall, subsequent encounter: Secondary | ICD-10-CM | POA: Insufficient documentation

## 2013-09-09 DIAGNOSIS — T24299A Burn of second degree of multiple sites of unspecified lower limb, except ankle and foot, initial encounter: Secondary | ICD-10-CM | POA: Insufficient documentation

## 2016-12-21 ENCOUNTER — Encounter (HOSPITAL_COMMUNITY): Payer: Self-pay | Admitting: Family Medicine

## 2016-12-21 ENCOUNTER — Ambulatory Visit (HOSPITAL_COMMUNITY)
Admission: EM | Admit: 2016-12-21 | Discharge: 2016-12-21 | Disposition: A | Discharge status: Home / Self Care | Payer: Medicare Other | Attending: Urgent Care | Admitting: Urgent Care

## 2016-12-21 DIAGNOSIS — I10 Essential (primary) hypertension: Secondary | ICD-10-CM | POA: Diagnosis not present

## 2016-12-21 DIAGNOSIS — F17200 Nicotine dependence, unspecified, uncomplicated: Secondary | ICD-10-CM

## 2016-12-21 DIAGNOSIS — R030 Elevated blood-pressure reading, without diagnosis of hypertension: Secondary | ICD-10-CM

## 2016-12-21 MED ORDER — AMLODIPINE BESYLATE 5 MG PO TABS: 5.0000 mg | ORAL_TABLET | Freq: Every day | ORAL | 1 refills | Status: DC

## 2016-12-21 MED ORDER — CLONIDINE HCL 0.1 MG PO TABS
0.2000 mg | ORAL_TABLET | Freq: Once | ORAL | Status: AC
  Administered 2016-12-21: 0.2 mg via ORAL

## 2016-12-21 MED ORDER — CLONIDINE HCL 0.1 MG PO TABS
ORAL_TABLET | ORAL | Status: AC
  Filled 2016-12-21: qty 2

## 2016-12-21 NOTE — ED Provider Notes (Signed)
  MRN: [id number] DOB: 11/20/1945  Subjective:   Gabriella Acosta is a 71 y.o. female presenting for intermittent dizziness for the past week, elevated blood pressure. Has a history of high blood pressure, she is not currently taking any medications. Diet is not compliant. Denies falls, confusion, headaches, chest pain, shortness of breath, dyspnea, heart racing, palpitations, nausea, vomiting, abdominal pain, hematuria, lower leg swelling. Smokes 1/2 ppd. Has 2 beers per night.  Gabriella Acosta is not currently taking any medications and has No Known Allergies.  Gabriella Acosta  has a past medical history of Hypertension. Denies past surgical history.    Objective:   Vitals: BP (!) 208/99   Pulse 78   Temp 98 F (36.7 C)   Resp 18   SpO2 100%   Physical Exam  Constitutional: She is oriented to person, place, and time. She appears well-developed and well-nourished.  HENT:  Mouth/Throat: Oropharynx is clear and moist.  Eyes: EOM are normal. Pupils are equal, round, and reactive to light. Right eye exhibits no discharge. Left eye exhibits no discharge.  Neck: Normal range of motion. Neck supple. No thyromegaly present.  Cardiovascular: Normal rate, regular rhythm and intact distal pulses. Exam reveals no gallop and no friction rub.  No murmur heard. Pulmonary/Chest: No respiratory distress. She has no wheezes. She has no rales.  Abdominal: Soft. Bowel sounds are normal. She exhibits no distension and no mass. There is no tenderness.  Musculoskeletal: She exhibits no edema.  Neurological: She is alert and oriented to person, place, and time. She displays normal reflexes. No cranial nerve deficit. Coordination normal.  Skin: Skin is warm and dry. No rash noted. No erythema. No pallor.  Psychiatric: She has a normal mood and affect.   Assessment and Plan :   Essential hypertension  Elevated blood pressure reading  Tobacco use disorder  Physical exam findings reassuring. Counseled patient  extensively on management of HTN. She will look to establish care for management of her HTN. Otherwise, will start amlodipine 5mg . Recommended lifestyle modifications. Strict ER and rtc precautions discussed, patient verbalized understanding. Follow up in 1 week.  Wallis BambergMario Steele Stracener, PA-C [facility] [phone] 12/21/2016  10:55 AM    Wallis BambergMani, Madailein Londo, PA-C 12/21/16 1157

## 2016-12-21 NOTE — Discharge Instructions (Signed)
Wallis BambergMario Glenford Garis, PA-C [address] [place], [place] [address]  [facility] [phone]  Use Mrs. Dash for seasoning of your foods.   Salads - kale, spinach, cabbage, spring mix; use seeds like pumpkin seeds or sunflower seeds; you can also use 1-2 hard boiled eggs. Fruits - avocadoes, berries (blueberries, raspberries, blackberries), apples, oranges, pomegranate, grapefruit Seeds - quinoa, chia seeds; you can also incorporate oatmeal Vegetables - aspargus, cauliflower, broccoli, green beans, brussel spouts, bell peppers; stay away from starchy vegetables like potatoes, carrots, peas   Brussel sprouts - Cut off stems. Place in a mixing bowl that has a lid. Pour in a 1/4-1/2 cup olive oil, spices, use a light amount of parmesan. Place on a baking sheet. Bake for 10 minutes at 400F. Take it out, eat the brussel chips. Place for another 5-10 minutes.   Mashed cauliflower - Boil a bunch of cauliflower in a pot of water. Blend in a food processor with 1-2 tablespoons of butter.  Spaghetti squash -  Cut the squash in half very carefully, clean out seeds from the middle. Place 1/2 face down in a microwave safe dish with at least 2 inches of water. Make 4-6 slits on outside of spaghetti squash and microwave for 10-12 minutes. Take out the spaghetti using a metal spoon. Repeat for the other half.    Vega protein is good protein powder, make sure you use ~6 ice cubes to give it smoothie consistency together with ~4-6 ounces of vanilla soy milk. Throw cinnamon into your shake, use peanut butter. You can also use the fruits that I listed above. Throw spinach or kale into the shake.

## 2016-12-21 NOTE — ED Triage Notes (Signed)
Pt here for 3 weeks of intermittent BP elevation. Reports that she checks it because of feeling dizzy, and nose bleeding. Hx of HTN but hasn't been on medication in years. Reports levels running around 200 sys each time. Currently a little dizzy. Denies chest pain, SOB,.

## 2016-12-23 ENCOUNTER — Emergency Department (HOSPITAL_COMMUNITY)
Admission: EM | Admit: 2016-12-23 | Discharge: 2016-12-23 | Disposition: A | Discharge status: Home / Self Care | Payer: Medicare Other | Attending: Emergency Medicine | Admitting: Emergency Medicine

## 2016-12-23 ENCOUNTER — Other Ambulatory Visit: Payer: Self-pay

## 2016-12-23 ENCOUNTER — Encounter (HOSPITAL_COMMUNITY): Payer: Self-pay

## 2016-12-23 DIAGNOSIS — Z79899 Other long term (current) drug therapy: Secondary | ICD-10-CM | POA: Diagnosis not present

## 2016-12-23 DIAGNOSIS — I10 Essential (primary) hypertension: Secondary | ICD-10-CM | POA: Insufficient documentation

## 2016-12-23 DIAGNOSIS — R42 Dizziness and giddiness: Secondary | ICD-10-CM | POA: Diagnosis not present

## 2016-12-23 LAB — BASIC METABOLIC PANEL
ANION GAP: 11 (ref 5–15)
BUN: 7 mg/dL (ref 6–20)
CALCIUM: 9.7 mg/dL (ref 8.9–10.3)
CO2: 25 mmol/L (ref 22–32)
Chloride: 101 mmol/L (ref 101–111)
Creatinine, Ser: 0.66 mg/dL (ref 0.44–1.00)
Glucose, Bld: 141 mg/dL — ABNORMAL HIGH (ref 65–99)
POTASSIUM: 4.1 mmol/L (ref 3.5–5.1)
SODIUM: 137 mmol/L (ref 135–145)

## 2016-12-23 LAB — CBC
HCT: 42.4 % (ref 36.0–46.0)
HEMOGLOBIN: 14.4 g/dL (ref 12.0–15.0)
MCH: 30.2 pg (ref 26.0–34.0)
MCHC: 34 g/dL (ref 30.0–36.0)
MCV: 88.9 fL (ref 78.0–100.0)
Platelets: 152 10*3/uL (ref 150–400)
RBC: 4.77 MIL/uL (ref 3.87–5.11)
RDW: 13.5 % (ref 11.5–15.5)
WBC: 8 10*3/uL (ref 4.0–10.5)

## 2016-12-23 MED ORDER — AMLODIPINE BESYLATE 5 MG PO TABS: 10.0000 mg | ORAL_TABLET | Freq: Every day | ORAL | 1 refills | Status: DC

## 2016-12-23 MED ORDER — AMLODIPINE BESYLATE 5 MG PO TABS
5.0000 mg | ORAL_TABLET | Freq: Once | ORAL | Status: AC
  Administered 2016-12-23: 5 mg via ORAL
  Filled 2016-12-23: qty 1

## 2016-12-23 MED ORDER — AMLODIPINE BESYLATE 5 MG PO TABS: 5.0000 mg | ORAL_TABLET | Freq: Every day | ORAL | 1 refills | Status: DC

## 2016-12-23 NOTE — Discharge Instructions (Signed)
Take 2 tablets of the Norvasc in the morning.  Do not take any Norvasc tonight.. Schedule a follow-up visit with Dr. Allyne GeeSanders

## 2016-12-23 NOTE — ED Notes (Signed)
ED Provider at bedside. 

## 2016-12-23 NOTE — ED Triage Notes (Signed)
Patient reports that she has had nosebleed and dizziness since 1st of last week. [facility] and started on amlodipine for her HTN. States that her BP is still very high with ongoing dizziness

## 2016-12-23 NOTE — ED Provider Notes (Signed)
[facility]   CSN: [id number] Arrival date & time: 12/23/16  1235     History   Chief Complaint Chief Complaint  Patient presents with  . Hypertension    HPI Gabriella Acosta is a 71 y.o. female.  71 year old female with increased blood pressure times several days.  Was seen in urgent care and started on Norvasc 2 days ago.  Has intermittent dizziness and she takes her blood pressure and is elevated.  Chest systolic at home up to 200.  No confusion or emesis.  No chest pain or shortness of breath.  No ataxia or focal weakness.  She does drink 6 beers a day as well as wine 2.  Denies any illicit drug use.  Nothing makes her symptoms better.      Past Medical History:  Diagnosis Date  . Hypertension     There are no active problems to display for this patient.   History reviewed. No pertinent surgical history.  OB History    No data available       Home Medications    Prior to Admission medications   Medication Sig Start Date End Date Taking? Authorizing Provider  amLODipine (NORVASC) 5 MG tablet Take 1 tablet (5 mg total) by mouth daily. 12/21/16   Wallis BambergMani, Mario, PA-C  naproxen (NAPROSYN) 500 MG tablet Take 500 mg by mouth daily as needed (pain).    [provider]  PRESCRIPTION MEDICATION Take 1 tablet by mouth daily. For blood pressure    [provider]  PRESCRIPTION MEDICATION Take 1 tablet by mouth daily. Heart medication    [provider]  PRESCRIPTION MEDICATION Take 1 tablet by mouth daily. Blood pressure medication    [provider]  PRESCRIPTION MEDICATION Take 1 tablet by mouth daily. Heart medication    [provider]  traMADol (ULTRAM) 50 MG tablet Take 1 tablet (50 mg total) by mouth every 6 (six) hours as needed. 01/25/13   Santiago GladLaisure, Heather, PA-C    Family History No family history on file.  Social History Social History   Tobacco Use  .  Smoking status: Never Smoker  . Smokeless tobacco: Never Used  Substance Use Topics  . Alcohol use: Yes    Comment: occassional  . Drug use: No     Allergies   Patient has no known allergies.   Review of Systems Review of Systems  All other systems reviewed and are negative.    Physical Exam Updated Vital Signs BP (!) 176/89   Pulse 70   Temp 98.6 F (37 C) (Oral)   Resp 16   Ht 1.626 m (5\' 4" )   Wt 61.7 kg (136 lb)   SpO2 100%   BMI 23.34 kg/m   Physical Exam  Constitutional: She is oriented to person, place, and time. She appears well-developed and well-nourished.  Non-toxic appearance. No distress.  HENT:  Head: Normocephalic and atraumatic.  Eyes: Conjunctivae, EOM and lids are normal. Pupils are equal, round, and reactive to light.  Neck: Normal range of motion. Neck supple. No tracheal deviation present. No thyroid mass present.  Cardiovascular: Normal rate, regular rhythm and normal heart sounds. Exam reveals no gallop.  No murmur heard. Pulmonary/Chest: Effort normal and breath sounds normal. No stridor. No respiratory distress. She has no decreased breath sounds. She has no wheezes. She has no rhonchi. She has no rales.  Abdominal: Soft. Normal appearance and bowel sounds are normal. She exhibits no distension.  There is no tenderness. There is no rebound and no CVA tenderness.  Musculoskeletal: Normal range of motion. She exhibits no edema or tenderness.  Neurological: She is alert and oriented to person, place, and time. She has normal strength. No cranial nerve deficit or sensory deficit. GCS eye subscore is 4. GCS verbal subscore is 5. GCS motor subscore is 6.  Skin: Skin is warm and dry. No abrasion and no rash noted.  Psychiatric: She has a normal mood and affect. Her speech is normal and behavior is normal.  Nursing note and vitals reviewed.    ED Treatments / Results  Labs (all labs ordered are listed, but only abnormal results are displayed) Labs  Reviewed  CBC  BASIC METABOLIC PANEL    EKG  EKG Interpretation  Date/Time:  Saturday December 23 2016 12:46:53 EST Ventricular Rate:  74 PR Interval:  114 QRS Duration: 90 QT Interval:  436 QTC Calculation: 483 R Axis:   70 Text Interpretation:  Normal sinus rhythm Minimal voltage criteria for LVH, may be normal variant Borderline ECG Confirmed by Lorre NickAllen, Surah Pelley ([id number]) on 12/23/2016 1:17:01 PM       Radiology No results found.  Procedures Procedures (including critical care time)  Medications Ordered in ED Medications  amLODipine (NORVASC) tablet 5 mg (not administered)     Initial Impression / Assessment and Plan / ED Course  I have reviewed the triage vital signs and the nursing notes.  Pertinent labs & imaging results that were available during my care of the patient were reviewed by me and considered in my medical decision making (see chart for details).     Patient medicated here with Norvasc repeat blood pressure has improved.  Given instructions on how to take her Norvasc.  Will refer to primary care and return precautions given  Final Clinical Impressions(s) / ED Diagnoses   Final diagnoses:  None    ED Discharge Orders    None       Lorre NickAllen, Zaidan Keeble, MD 12/23/16 1427

## 2017-02-21 ENCOUNTER — Telehealth: Payer: Self-pay | Admitting: *Deleted

## 2017-02-21 NOTE — Telephone Encounter (Signed)
Patient left a msg on the refill vm requesting to cancel her upcoming appointment. She can be reached at [phone]. Thanks, MI

## 2017-02-26 ENCOUNTER — Ambulatory Visit: Payer: Medicare Other | Admitting: Cardiovascular Disease

## 2017-10-31 ENCOUNTER — Ambulatory Visit (INDEPENDENT_AMBULATORY_CARE_PROVIDER_SITE_OTHER): Payer: Medicare Other | Admitting: Internal Medicine

## 2017-10-31 ENCOUNTER — Other Ambulatory Visit: Payer: Self-pay | Admitting: Internal Medicine

## 2017-10-31 ENCOUNTER — Ambulatory Visit: Payer: Medicare Other

## 2017-10-31 VITALS — BP 170/102 | Temp 98.7°F | Ht 64.0 in | Wt 131.0 lb

## 2017-10-31 VITALS — BP 172/102 | HR 75 | Temp 98.7°F | Ht 64.0 in | Wt 131.0 lb

## 2017-10-31 DIAGNOSIS — E2839 Other primary ovarian failure: Secondary | ICD-10-CM

## 2017-10-31 DIAGNOSIS — R809 Proteinuria, unspecified: Secondary | ICD-10-CM

## 2017-10-31 DIAGNOSIS — Z716 Tobacco abuse counseling: Secondary | ICD-10-CM

## 2017-10-31 DIAGNOSIS — Z13820 Encounter for screening for osteoporosis: Secondary | ICD-10-CM

## 2017-10-31 DIAGNOSIS — I10 Essential (primary) hypertension: Secondary | ICD-10-CM | POA: Diagnosis not present

## 2017-10-31 DIAGNOSIS — Z0000 Encounter for general adult medical examination without abnormal findings: Secondary | ICD-10-CM

## 2017-10-31 DIAGNOSIS — R9431 Abnormal electrocardiogram [ECG] [EKG]: Secondary | ICD-10-CM

## 2017-10-31 DIAGNOSIS — Z9114 Patient's other noncompliance with medication regimen: Secondary | ICD-10-CM

## 2017-10-31 LAB — POCT URINALYSIS DIPSTICK
Glucose, UA: NEGATIVE
Ketones, UA: NEGATIVE
Leukocytes, UA: NEGATIVE
NITRITE UA: NEGATIVE
PH UA: 5.5 (ref 5.0–8.0)
PROTEIN UA: POSITIVE — AB
Spec Grav, UA: >=1.03 — AB (ref 1.010–1.025)
Urobilinogen, UA: 0.2 E.U./dL

## 2017-10-31 LAB — POCT UA - MICROALBUMIN
CREATININE, POC: 300 mg/dL
MICROALBUMIN (UR) POC: 150 mg/L

## 2017-10-31 MED ORDER — AMLODIPINE BESYLATE 5 MG PO TABS: 5.0000 mg | ORAL_TABLET | Freq: Every day | ORAL | 0 refills | Status: DC

## 2017-10-31 NOTE — Patient Instructions (Signed)
1- Place your blood pressure medication by your toothbrush to help you remember taking it 2- Do blood pressure diaries 3 or more hours after taking your blood pressure medication. 3- come back in 2 weeks to see Gabriella Acosta for blood pressure follow up. Bring the readings you are going to be taking.  4- if you continue to not taking your medication and controlling your blood pressure, you are at hight risk to have a stroke, heart attack and end up with kidney failure.

## 2017-10-31 NOTE — Progress Notes (Signed)
Subjective:     Patient ID: Gabriella Acosta , female    DOB: Mar 01, 1945 , 72 y.o.   MRN: [id number]   HPI  Pt comes in today for Medicare wellness visit and routine office visit.  She has not been taking her HTN medication for several months because she has been forgetting. Has been a couple of months since she has taken it.  She continues to smoke 1/2 ppd  Past Medical History:  Diagnosis Date  . Hypertension       Current Outpatient Medications:  .  amLODipine (NORVASC) 5 MG tablet, Take 1 tablet (5 mg total) by mouth daily., Disp: 30 tablet, Rfl: 0   No Known Allergies   Review of Systems  Constitutional: Positive for diaphoresis. Negative for chills, fever and unexpected weight change.       Sweats from hot flushes  HENT: Negative for postnasal drip, rhinorrhea and tinnitus.        Takes Zyrtec D for allergies  Respiratory: Negative for chest tightness and shortness of breath.   Cardiovascular: Negative for chest pain, palpitations and leg swelling.  Gastrointestinal: Negative for abdominal pain, constipation, diarrhea and nausea.  Endocrine: Negative for polydipsia and polyphagia.  Genitourinary: Negative for dysuria and hematuria.  Skin: Negative for rash and wound.  Allergic/Immunologic: Negative for environmental allergies.  Neurological: Negative for facial asymmetry, weakness, light-headedness, numbness and headaches.  Psychiatric/Behavioral: Positive for sleep disturbance.       Only sleeps 6h, has trouble falling asleep and hot flushes keeps her up     Today's Vitals   10/31/17 1125 10/31/17 1251  BP: (!) 170/102 (!) 172/102  Pulse: 75   Temp: 98.7 F (37.1 C)   TempSrc: Oral   Weight: 131 lb (59.4 kg)   Height: 5\' 4"  (1.626 m)    Body mass index is 22.49 kg/m.   Objective:  Physical Exam  Constitutional: She is oriented to person, place, and time. She appears well-developed and well-nourished. No distress.  HENT:  Right Ear: External ear  normal.  Left Ear: External ear normal.  Nose: Nose normal.  Eyes: Conjunctivae are normal. No scleral icterus.  Neck: Neck supple. No thyromegaly present.  No carotid bruit  Cardiovascular: Normal rate and regular rhythm.  No murmur heard. Pulmonary/Chest: Effort normal and breath sounds normal. No respiratory distress.  Musculoskeletal: Normal range of motion. She exhibits no edema.  Lymphadenopathy:    She has no cervical adenopathy.  Neurological: She is alert and oriented to person, place, and time.  Skin: Skin is warm and dry. No rash noted. She is not diaphoretic. No erythema.  Psychiatric: She has a normal mood and affect. Her behavior is normal. Judgment and thought content normal.  Nursing note and vitals reviewed. I repeated her  And was still BP 172/ 102.  EKG- LVH, and negative T-waves. See scanned copy.      Assessment And Plan:     1. Hypertension, essential poorly controlled. CBC, CMP ordered. Needs to get back on her Norvasc. I sent # 30. FU 2 weeks, and needs to bring BP diaries.   She was seen for Medicare Wellness today UA- shows sm bili, SG- 1.030, Moderate blood, +2 protein. Urine microalbumin abnormal.  2-  Abnormal EKG- pt asymptomatic Sent to cardiology. 3- Noncompliance with medication treatment. - advised strongly to get back on medication and the serious consequences of untreated HTN.  4- Tabacco abuse- encouraged to d/c smoking.  5- Microalbuminuria- new. I will see  what her CMP shows when is back, advised to avoid NSAD's.   Lenka Zhao RODRIGUEZ-SOUTHWORTH, PA-C

## 2017-10-31 NOTE — Patient Instructions (Signed)
Gabriella Acosta , Thank you for taking time to come for your Medicare Wellness Visit. I appreciate your ongoing commitment to your health goals. Please review the following plan we discussed and let me know if I can assist you in the future.   Screening recommendations/referrals: Colonoscopy: declines Mammogram: needs referral Bone Density: needs referral Recommended yearly ophthalmology/optometry visit for glaucoma screening and checkup Recommended yearly dental visit for hygiene and checkup  Vaccinations: Influenza vaccine: declines Pneumococcal vaccine: declines Tdap vaccine: declines Shingles vaccine: declines    Advanced directives: Advance directive discussed with you today. I have provided a copy for you to complete at home and have notarized. Once this is complete please bring a copy in to our office so we can scan it into your chart.   Conditions/risks identified: Hypertension: Patient's blood pressure was abnormally high. States she has not been taking her medications as directed  Next appointment: 11/14/2017   Preventive Care 65 Years and Older, Female Preventive care refers to lifestyle choices and visits with your health care provider that can promote health and wellness. What does preventive care include?  A yearly physical exam. This is also called an annual well check.  Dental exams once or twice a year.  Routine eye exams. Ask your health care provider how often you should have your eyes checked.  Personal lifestyle choices, including:  Daily care of your teeth and gums.  Regular physical activity.  Eating a healthy diet.  Avoiding tobacco and drug use.  Limiting alcohol use.  Practicing safe sex.  Taking low-dose aspirin every day.  Taking vitamin and mineral supplements as recommended by your health care provider. What happens during an annual well check? The services and screenings done by your health care provider during your annual well check will  depend on your age, overall health, lifestyle risk factors, and family history of disease. Counseling  Your health care provider may ask you questions about your:  Alcohol use.  Tobacco use.  Drug use.  Emotional well-being.  Home and relationship well-being.  Sexual activity.  Eating habits.  History of falls.  Memory and ability to understand (cognition).  Work and work Astronomer.  Reproductive health. Screening  You may have the following tests or measurements:  Height, weight, and BMI.  Blood pressure.  Lipid and cholesterol levels. These may be checked every 5 years, or more frequently if you are over 60 years old.  Skin check.  Lung cancer screening. You may have this screening every year starting at age 38 if you have a 30-pack-year history of smoking and currently smoke or have quit within the past 15 years.  Fecal occult blood test (FOBT) of the stool. You may have this test every year starting at age 40.  Flexible sigmoidoscopy or colonoscopy. You may have a sigmoidoscopy every 5 years or a colonoscopy every 10 years starting at age 19.  Hepatitis C blood test.  Hepatitis B blood test.  Sexually transmitted disease (STD) testing.  Diabetes screening. This is done by checking your blood sugar (glucose) after you have not eaten for a while (fasting). You may have this done every 1-3 years.  Bone density scan. This is done to screen for osteoporosis. You may have this done starting at age 5.  Mammogram. This may be done every 1-2 years. Talk to your health care provider about how often you should have regular mammograms. Talk with your health care provider about your test results, treatment options, and if necessary, the need  for more tests. Vaccines  Your health care provider may recommend certain vaccines, such as:  Influenza vaccine. This is recommended every year.  Tetanus, diphtheria, and acellular pertussis (Tdap, Td) vaccine. You may need a  Td booster every 10 years.  Zoster vaccine. You may need this after age 42.  Pneumococcal 13-valent conjugate (PCV13) vaccine. One dose is recommended after age 40.  Pneumococcal polysaccharide (PPSV23) vaccine. One dose is recommended after age 22. Talk to your health care provider about which screenings and vaccines you need and how often you need them. This information is not intended to replace advice given to you by your health care provider. Make sure you discuss any questions you have with your health care provider. Document Released: 01/22/2015 Document Revised: 09/15/2015 Document Reviewed: 10/27/2014 Elsevier Interactive Patient Education  2017 [place] Prevention in the Home Falls can cause injuries. They can happen to people of all ages. There are many things you can do to make your home safe and to help prevent falls. What can I do on the outside of my home?  Regularly fix the edges of walkways and driveways and fix any cracks.  Remove anything that might make you trip as you walk through a door, such as a raised step or threshold.  Trim any bushes or trees on the path to your home.  Use bright outdoor lighting.  Clear any walking paths of anything that might make someone trip, such as rocks or tools.  Regularly check to see if handrails are loose or broken. Make sure that both sides of any steps have handrails.  Any raised decks and porches should have guardrails on the edges.  Have any leaves, snow, or ice cleared regularly.  Use sand or salt on walking paths during winter.  Clean up any spills in your garage right away. This includes oil or grease spills. What can I do in the bathroom?  Use night lights.  Install grab bars by the toilet and in the tub and shower. Do not use towel bars as grab bars.  Use non-skid mats or decals in the tub or shower.  If you need to sit down in the shower, use a plastic, non-slip stool.  Keep the floor dry. Clean  up any water that spills on the floor as soon as it happens.  Remove soap buildup in the tub or shower regularly.  Attach bath mats securely with double-sided non-slip rug tape.  Do not have throw rugs and other things on the floor that can make you trip. What can I do in the bedroom?  Use night lights.  Make sure that you have a light by your bed that is easy to reach.  Do not use any sheets or blankets that are too big for your bed. They should not hang down onto the floor.  Have a firm chair that has side arms. You can use this for support while you get dressed.  Do not have throw rugs and other things on the floor that can make you trip. What can I do in the kitchen?  Clean up any spills right away.  Avoid walking on wet floors.  Keep items that you use a lot in easy-to-reach places.  If you need to reach something above you, use a strong step stool that has a grab bar.  Keep electrical cords out of the way.  Do not use floor polish or wax that makes floors slippery. If you must use wax, use  non-skid floor wax.  Do not have throw rugs and other things on the floor that can make you trip. What can I do with my stairs?  Do not leave any items on the stairs.  Make sure that there are handrails on both sides of the stairs and use them. Fix handrails that are broken or loose. Make sure that handrails are as long as the stairways.  Check any carpeting to make sure that it is firmly attached to the stairs. Fix any carpet that is loose or worn.  Avoid having throw rugs at the top or bottom of the stairs. If you do have throw rugs, attach them to the floor with carpet tape.  Make sure that you have a light switch at the top of the stairs and the bottom of the stairs. If you do not have them, ask someone to add them for you. What else can I do to help prevent falls?  Wear shoes that:  Do not have high heels.  Have rubber bottoms.  Are comfortable and fit you well.  Are  closed at the toe. Do not wear sandals.  If you use a stepladder:  Make sure that it is fully opened. Do not climb a closed stepladder.  Make sure that both sides of the stepladder are locked into place.  Ask someone to hold it for you, if possible.  Clearly mark and make sure that you can see:  Any grab bars or handrails.  First and last steps.  Where the edge of each step is.  Use tools that help you move around (mobility aids) if they are needed. These include:  Canes.  Walkers.  Scooters.  Crutches.  Turn on the lights when you go into a dark area. Replace any light bulbs as soon as they burn out.  Set up your furniture so you have a clear path. Avoid moving your furniture around.  If any of your floors are uneven, fix them.  If there are any pets around you, be aware of where they are.  Review your medicines with your doctor. Some medicines can make you feel dizzy. This can increase your chance of falling. Ask your doctor what other things that you can do to help prevent falls. This information is not intended to replace advice given to you by your health care provider. Make sure you discuss any questions you have with your health care provider. Document Released: 10/22/2008 Document Revised: 06/03/2015 Document Reviewed: 01/30/2014 Elsevier Interactive Patient Education  2017 [employer].
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
## 2017-11-01 ENCOUNTER — Encounter: Payer: Self-pay | Admitting: Internal Medicine

## 2017-11-01 ENCOUNTER — Other Ambulatory Visit: Payer: Self-pay | Admitting: Internal Medicine

## 2017-11-01 DIAGNOSIS — D696 Thrombocytopenia, unspecified: Secondary | ICD-10-CM

## 2017-11-01 LAB — CMP14 + ANION GAP
ALBUMIN: 4.7 g/dL (ref 3.5–4.8)
ALK PHOS: 129 IU/L — AB (ref 39–117)
ALT: 25 IU/L (ref 0–32)
ANION GAP: 18 mmol/L (ref 10.0–18.0)
AST: 35 IU/L (ref 0–40)
Albumin/Globulin Ratio: 1.6 (ref 1.2–2.2)
BUN / CREAT RATIO: 12 (ref 12–28)
BUN: 8 mg/dL (ref 8–27)
Bilirubin Total: 0.3 mg/dL (ref 0.0–1.2)
CALCIUM: 10 mg/dL (ref 8.7–10.3)
CO2: 25 mmol/L (ref 20–29)
CREATININE: 0.66 mg/dL (ref 0.57–1.00)
Chloride: 100 mmol/L (ref 96–106)
GFR calc Af Amer: 104 mL/min/{1.73_m2} (ref >59)
GFR, EST NON AFRICAN AMERICAN: 90 mL/min/{1.73_m2} (ref >59)
Globulin, Total: 2.9 g/dL (ref 1.5–4.5)
Glucose: 104 mg/dL — ABNORMAL HIGH (ref 65–99)
Potassium: 4.1 mmol/L (ref 3.5–5.2)
Sodium: 143 mmol/L (ref 134–144)
TOTAL PROTEIN: 7.6 g/dL (ref 6.0–8.5)

## 2017-11-01 LAB — CBC
HEMATOCRIT: 43.2 % (ref 34.0–46.6)
HEMOGLOBIN: 14.7 g/dL (ref 11.1–15.9)
MCH: 30.1 pg (ref 26.6–33.0)
MCHC: 34 g/dL (ref 31.5–35.7)
MCV: 88 fL (ref 79–97)
Platelets: 121 10*3/uL — ABNORMAL LOW (ref 150–450)
RBC: 4.89 x10E6/uL (ref 3.77–5.28)
RDW: 14.5 % (ref 12.3–15.4)
WBC: 10.5 10*3/uL (ref 3.4–10.8)

## 2017-11-01 LAB — LIPID PANEL
CHOLESTEROL TOTAL: 320 mg/dL — AB (ref 100–199)
Chol/HDL Ratio: 7 ratio — ABNORMAL HIGH (ref 0.0–4.4)
HDL: 46 mg/dL (ref >39)
Triglycerides: 412 mg/dL — ABNORMAL HIGH (ref 0–149)

## 2017-11-14 ENCOUNTER — Ambulatory Visit: Payer: Medicare Other | Admitting: Internal Medicine

## 2017-11-20 ENCOUNTER — Inpatient Hospital Stay: Payer: Medicare Other | Attending: Hematology and Oncology | Admitting: Hematology and Oncology

## 2017-11-21 ENCOUNTER — Telehealth: Payer: Self-pay | Admitting: Hematology and Oncology

## 2017-11-21 NOTE — Telephone Encounter (Signed)
Lft the pt a vm to reschedule appt °

## 2017-12-10 ENCOUNTER — Other Ambulatory Visit: Payer: Self-pay

## 2017-12-10 ENCOUNTER — Emergency Department (HOSPITAL_COMMUNITY): Payer: Medicare Other

## 2017-12-10 ENCOUNTER — Emergency Department (HOSPITAL_COMMUNITY)
Admission: EM | Admit: 2017-12-10 | Discharge: 2017-12-10 | Disposition: A | Discharge status: Home / Self Care | Payer: Medicare Other | Attending: Emergency Medicine | Admitting: Emergency Medicine

## 2017-12-10 ENCOUNTER — Encounter (HOSPITAL_COMMUNITY): Payer: Self-pay

## 2017-12-10 DIAGNOSIS — Y939 Activity, unspecified: Secondary | ICD-10-CM | POA: Insufficient documentation

## 2017-12-10 DIAGNOSIS — Y929 Unspecified place or not applicable: Secondary | ICD-10-CM | POA: Insufficient documentation

## 2017-12-10 DIAGNOSIS — W19XXXA Unspecified fall, initial encounter: Secondary | ICD-10-CM | POA: Insufficient documentation

## 2017-12-10 DIAGNOSIS — Y999 Unspecified external cause status: Secondary | ICD-10-CM | POA: Insufficient documentation

## 2017-12-10 DIAGNOSIS — F17210 Nicotine dependence, cigarettes, uncomplicated: Secondary | ICD-10-CM | POA: Insufficient documentation

## 2017-12-10 DIAGNOSIS — E876 Hypokalemia: Secondary | ICD-10-CM | POA: Insufficient documentation

## 2017-12-10 DIAGNOSIS — R040 Epistaxis: Secondary | ICD-10-CM | POA: Insufficient documentation

## 2017-12-10 DIAGNOSIS — I10 Essential (primary) hypertension: Secondary | ICD-10-CM | POA: Insufficient documentation

## 2017-12-10 DIAGNOSIS — Z79899 Other long term (current) drug therapy: Secondary | ICD-10-CM | POA: Insufficient documentation

## 2017-12-10 DIAGNOSIS — S63602A Unspecified sprain of left thumb, initial encounter: Secondary | ICD-10-CM | POA: Insufficient documentation

## 2017-12-10 DIAGNOSIS — R42 Dizziness and giddiness: Secondary | ICD-10-CM | POA: Insufficient documentation

## 2017-12-10 LAB — CBC WITH DIFFERENTIAL/PLATELET
Abs Immature Granulocytes: 0.03 10*3/uL (ref 0.00–0.07)
BASOS ABS: 0 10*3/uL (ref 0.0–0.1)
BASOS PCT: 0 %
EOS ABS: 0.1 10*3/uL (ref 0.0–0.5)
Eosinophils Relative: 1 %
HCT: 39.8 % (ref 36.0–46.0)
Hemoglobin: 13 g/dL (ref 12.0–15.0)
IMMATURE GRANULOCYTES: 0 %
LYMPHS PCT: 25 %
Lymphs Abs: 2.3 10*3/uL (ref 0.7–4.0)
MCH: 28.6 pg (ref 26.0–34.0)
MCHC: 32.7 g/dL (ref 30.0–36.0)
MCV: 87.5 fL (ref 80.0–100.0)
MONO ABS: 0.5 10*3/uL (ref 0.1–1.0)
Monocytes Relative: 6 %
NEUTROS ABS: 6.3 10*3/uL (ref 1.7–7.7)
NEUTROS PCT: 68 %
NRBC: 0 % (ref 0.0–0.2)
Platelets: 160 10*3/uL (ref 150–400)
RBC: 4.55 MIL/uL (ref 3.87–5.11)
RDW: 12.6 % (ref 11.5–15.5)
WBC: 9.3 10*3/uL (ref 4.0–10.5)

## 2017-12-10 LAB — COMPREHENSIVE METABOLIC PANEL
ALBUMIN: 3.8 g/dL (ref 3.5–5.0)
ALT: 25 U/L (ref 0–44)
AST: 32 U/L (ref 15–41)
Alkaline Phosphatase: 100 U/L (ref 38–126)
Anion gap: 13 (ref 5–15)
BILIRUBIN TOTAL: 0.3 mg/dL (ref 0.3–1.2)
BUN: 7 mg/dL — AB (ref 8–23)
CHLORIDE: 99 mmol/L (ref 98–111)
CO2: 24 mmol/L (ref 22–32)
Calcium: 9.7 mg/dL (ref 8.9–10.3)
Creatinine, Ser: 0.7 mg/dL (ref 0.44–1.00)
GFR calc Af Amer: >60 mL/min (ref >60)
Glucose, Bld: 140 mg/dL — ABNORMAL HIGH (ref 70–99)
POTASSIUM: 2.7 mmol/L — AB (ref 3.5–5.1)
Sodium: 136 mmol/L (ref 135–145)
TOTAL PROTEIN: 7.3 g/dL (ref 6.5–8.1)

## 2017-12-10 MED ORDER — ACETAMINOPHEN 325 MG PO TABS
650.0000 mg | ORAL_TABLET | Freq: Once | ORAL | Status: AC
  Administered 2017-12-10: 650 mg via ORAL
  Filled 2017-12-10: qty 2

## 2017-12-10 MED ORDER — POTASSIUM CHLORIDE CRYS ER 20 MEQ PO TBCR
60.0000 meq | EXTENDED_RELEASE_TABLET | Freq: Once | ORAL | Status: AC
  Administered 2017-12-10: 60 meq via ORAL
  Filled 2017-12-10: qty 3

## 2017-12-10 NOTE — ED Triage Notes (Addendum)
Pt c/o nosebleeds that started yesterday. Per pt, last night around 8:30 she became dizzy and had syncopal episode. Pt denies hitting head. Pt c/o left arm pain after syncopal episode. Last nosebleed occurred this morning. Pt a&ox4.

## 2017-12-10 NOTE — Discharge Instructions (Signed)
You should limit alcohol intake to 1 drink per day.  If you need help ask Dr. Allyne GeeSanders for help with drinking.  Also asked Dr. Allyne GeeSanders to help you to stop smoking.  Get saline nasal spray from your local pharmacy and spray into each nostril 1 time every 2 hours while awake.  If your nose rebleeds pinch her nostrils shut for 20 minutes.  Please schedule an appoint with Dr. Allyne GeeSanders to get your blood potassium rechecked in 7 to 10 days.  Your blood potassium was low today at 2.7 if you cannot get bleeding to stop return to the emergency department.    Wear the splint for comfort.  Place an ice pack over the painful area on your hand 4 times daily for 30 minutes at a time.  Take Tylenol as directed for pain.  If having significant pain in for 5 days call Dr. Janee Mornhompson to schedule an office visit.   Please call Dr.Teoh's office to schedule a visit regarding recurrent nosebleeds

## 2017-12-10 NOTE — ED Notes (Signed)
Dr. Ethelda ChickJacubowitz notified that potassium resulted at 2.7
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
## 2017-12-25 ENCOUNTER — Encounter: Payer: Self-pay | Admitting: Internal Medicine

## 2017-12-25 ENCOUNTER — Ambulatory Visit: Payer: Medicare Other | Admitting: Internal Medicine

## 2017-12-25 ENCOUNTER — Other Ambulatory Visit: Payer: Self-pay | Admitting: Internal Medicine

## 2017-12-25 VITALS — BP 140/80 | HR 83 | Temp 98.4°F | Ht 64.0 in | Wt 132.2 lb

## 2017-12-25 DIAGNOSIS — I10 Essential (primary) hypertension: Secondary | ICD-10-CM

## 2017-12-25 DIAGNOSIS — E876 Hypokalemia: Secondary | ICD-10-CM

## 2017-12-25 MED ORDER — TELMISARTAN 20 MG PO TABS: 20.0000 mg | ORAL_TABLET | Freq: Every day | ORAL | 0 refills | Status: DC

## 2017-12-25 MED ORDER — TELMISARTAN-HCTZ 40-12.5 MG PO TABS: 1.0000 | ORAL_TABLET | Freq: Every day | ORAL | 0 refills | Status: DC

## 2017-12-25 MED ORDER — POTASSIUM CHLORIDE ER 10 MEQ PO TBCR: 10.0000 meq | EXTENDED_RELEASE_TABLET | Freq: Every day | ORAL | 2 refills | Status: DC

## 2017-12-25 NOTE — Patient Instructions (Addendum)
Dr Allyne GeeSanders recommends to add a little water pill to your current medication, but we are also adding oral potasium     Hypokalemia Hypokalemia means that the amount of potassium in the blood is lower than normal.Potassium is a chemical that helps regulate the amount of fluid in the body (electrolyte). It also stimulates muscle tightening (contraction) and helps nerves work properly.Normally, most of the body's potassium is inside of cells, and only a very small amount is in the blood. Because the amount in the blood is so small, minor changes to potassium levels in the blood can be life-threatening. What are the causes? This condition may be caused by:  Antibiotic medicine.  Diarrhea or vomiting. Taking too much of a medicine that helps you have a bowel movement (laxative) can cause diarrhea and lead to hypokalemia.  Chronic kidney disease (CKD).  Medicines that help the body get rid of excess fluid (diuretics).  Eating disorders, such as bulimia.  Low magnesium levels in the body.  Sweating a lot.  What are the signs or symptoms? Symptoms of this condition include:  Weakness.  Constipation.  Fatigue.  Muscle cramps.  Mental confusion.  Skipped heartbeats or irregular heartbeat (palpitations).  Tingling or numbness.  How is this diagnosed? This condition is diagnosed with a blood test. How is this treated? Hypokalemia can be treated by taking potassium supplements by mouth or adjusting the medicines that you take. Treatment may also include eating more foods that contain a lot of potassium. If your potassium level is very low, you may need to get potassium through an IV tube in one of your veins and be monitored in the hospital. Follow these instructions at home:  Take over-the-counter and prescription medicines only as told by your health care provider. This includes vitamins and supplements.  Eat a healthy diet. A healthy diet includes fresh fruits and vegetables,  whole grains, healthy fats, and lean proteins.  If instructed, eat more foods that contain a lot of potassium, such as: ? Nuts, such as peanuts and pistachios. ? Seeds, such as sunflower seeds and pumpkin seeds. ? Peas, lentils, and lima beans. ? Whole grain and bran cereals and breads. ? Fresh fruits and vegetables, such as apricots, avocado, bananas, cantaloupe, kiwi, oranges, tomatoes, asparagus, and potatoes. ? Orange juice. ? Tomato juice. ? Red meats. ? Yogurt.  Keep all follow-up visits as told by your health care provider. This is important. Contact a health care provider if:  You have weakness that gets worse.  You feel your heart pounding or racing.  You vomit.  You have diarrhea.  You have diabetes (diabetes mellitus) and you have trouble keeping your blood sugar (glucose) in your target range. Get help right away if:  You have chest pain.  You have shortness of breath.  You have vomiting or diarrhea that lasts for more than 2 days.  You faint. This information is not intended to replace advice given to you by your health care provider. Make sure you discuss any questions you have with your health care provider. Document Released: 12/26/2004 Document Revised: 08/14/2015 Document Reviewed: 08/14/2015 Elsevier Interactive Patient Education  2018 [employer]

## 2017-12-25 NOTE — Progress Notes (Signed)
Subjective:     Patient ID: Gabriella Acosta , female    DOB: [birth year] , 72 y.o.   MRN: [id number]   Chief Complaint  Patient presents with  . Follow-up    nose bleed which has stopped and low pottasium     HPI  Pt is here to have potasium checked. She was in the ER 12/2 with epistaxis which has resolved now.   She was given a dose of oral potasium while in ER. Denies any calf cramps. Pt states her Amlodipine was changed to Telmisartan 20 mg qd. She has not been monitoring her BP. Has been eating a banana a day.   Past Medical History:  Diagnosis Date  . Hypertension      Family History  Problem Relation Age of Onset  . Diabetes Mother   . Diabetes Father   . Heart disease Father      Current Outpatient Medications:  .  amLODipine (NORVASC) 5 MG tablet, TAKE 1 TABLET BY MOUTH DAILY (Patient taking differently: Take 5 mg by mouth daily. ), Disp: 90 tablet, Rfl: 0 .  rosuvastatin (CRESTOR) 10 MG tablet, Take 10 mg by mouth daily., Disp: , Rfl:    No Known Allergies   Review of Systems  Constitutional: Negative for diaphoresis.  HENT: Negative for nosebleeds and tinnitus.   Eyes: Negative for visual disturbance.  Respiratory: Negative for chest tightness and shortness of breath.   Cardiovascular: Negative for chest pain, palpitations and leg swelling.  Gastrointestinal: Negative for nausea.  Genitourinary: Negative for difficulty urinating.  Musculoskeletal: Negative for gait problem.  Neurological: Negative for dizziness, tremors, speech difficulty and headaches.     Today's Vitals   12/25/17 1511  BP: 140/80  Pulse: 83  Temp: 98.4 F (36.9 C)  TempSrc: Oral  SpO2: 98%  Weight: 132 lb 3.2 oz (60 kg)  Height: 5\' 4"  (1.626 m)   Body mass index is 22.69 kg/m.   Objective:  Physical Exam   Constitutional: She is oriented to person, place, and time. She appears well-developed and well-nourished. No distress.  HENT:  Head: Normocephalic and atraumatic.   Right Ear: External ear normal.  Left Ear: External ear normal.  Nose: Nose normal.  Eyes: Conjunctivae are normal. Right eye exhibits no discharge. Left eye exhibits no discharge. No scleral icterus.  Neck: Neck supple. No thyromegaly present.  No carotid bruits bilaterally  Cardiovascular: Normal rate and regular rhythm.  2/6  murmur heard. Pulmonary/Chest: Effort normal and breath sounds normal. No respiratory distress.  Musculoskeletal: Normal range of motion. She exhibits no edema.  Lymphadenopathy:    She has no cervical adenopathy.  Neurological: She is alert and oriented to person, place, and time.  Skin: Skin is warm and dry. Capillary refill takes less than 2 seconds. No rash noted. She is not diaphoretic.  Psychiatric: She has a normal mood and affect. Her behavior is normal. Judgment and thought content normal.  Nursing note reviewed.     Assessment And Plan:    1. Hypokalemia- acute - BMP8+Anion Gap - potassium chloride (KLOR-CON 10) 10 MEQ tablet; Take 1 tablet (10 mEq total) by mouth daily.  Dispense: 30 tablet; Refill: 2  2. Essential hypertension- not well controlled - telmisartan-hydrochlorothiazide (MICARDIS HCT) 40-12.5 MG tablet; Take 1 tablet by mouth daily.  Dispense: 30 tablet; Refill: 0 Per Dr Allyne Gee I increased her Telmisartan to 40 mg and added HCTZ 12.5. I also placed her on Potassium to take qd with this med.  FU 1 month for HTN and trial of new meds.     Marcene Laskowski RODRIGUEZ-SOUTHWORTH, PA-C

## 2017-12-26 LAB — BMP8+ANION GAP
ANION GAP: 17 mmol/L (ref 10.0–18.0)
BUN/Creatinine Ratio: 17 (ref 12–28)
BUN: 12 mg/dL (ref 8–27)
CALCIUM: 10 mg/dL (ref 8.7–10.3)
CO2: 22 mmol/L (ref 20–29)
Chloride: 100 mmol/L (ref 96–106)
Creatinine, Ser: 0.7 mg/dL (ref 0.57–1.00)
GFR, EST AFRICAN AMERICAN: 102 mL/min/{1.73_m2} (ref >59)
GFR, EST NON AFRICAN AMERICAN: 88 mL/min/{1.73_m2} (ref >59)
Glucose: 99 mg/dL (ref 65–99)
POTASSIUM: 4.3 mmol/L (ref 3.5–5.2)
Sodium: 139 mmol/L (ref 134–144)

## 2018-01-10 ENCOUNTER — Other Ambulatory Visit: Payer: Self-pay | Admitting: Internal Medicine

## 2018-01-11 ENCOUNTER — Other Ambulatory Visit: Payer: Self-pay

## 2018-01-11 ENCOUNTER — Ambulatory Visit: Payer: Self-pay

## 2018-01-30 ENCOUNTER — Ambulatory Visit: Payer: Medicare Other | Admitting: Internal Medicine

## 2018-04-15 ENCOUNTER — Telehealth: Payer: Self-pay

## 2018-04-15 NOTE — Telephone Encounter (Signed)
Left the pt a message to call the office back.  I returned the pt's call to let her know that Dr. Allyne Gee said the pt would need to be evaluated to get a letter for work because the pt said that she was out of work for a few days and needed a note to return to work and that she had picked up some allergy med from the store.  I left on there that she can do an e-visit or phone visit.

## 2018-04-15 NOTE — Telephone Encounter (Signed)
Error

## 2018-05-21 ENCOUNTER — Ambulatory Visit (INDEPENDENT_AMBULATORY_CARE_PROVIDER_SITE_OTHER): Payer: Medicare Other | Admitting: Nurse Practitioner

## 2018-05-21 ENCOUNTER — Other Ambulatory Visit: Payer: Self-pay

## 2018-05-21 ENCOUNTER — Encounter: Payer: Self-pay | Admitting: Nurse Practitioner

## 2018-05-21 DIAGNOSIS — E782 Mixed hyperlipidemia: Secondary | ICD-10-CM | POA: Diagnosis not present

## 2018-05-21 DIAGNOSIS — I10 Essential (primary) hypertension: Secondary | ICD-10-CM
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
## 2018-06-19 ENCOUNTER — Telehealth: Payer: Self-pay

## 2018-06-19 NOTE — Telephone Encounter (Signed)
The pt said she agreed to a virtual appointment for tomorrow for swelling of her right ankle.

## 2018-06-20 ENCOUNTER — Other Ambulatory Visit: Payer: Self-pay

## 2018-06-20 ENCOUNTER — Encounter: Payer: Self-pay | Admitting: Nurse Practitioner

## 2018-06-20 ENCOUNTER — Ambulatory Visit (INDEPENDENT_AMBULATORY_CARE_PROVIDER_SITE_OTHER): Payer: Medicare Other | Admitting: Nurse Practitioner

## 2018-06-20 VITALS — BP 117/65 | Temp 97.5°F

## 2018-06-20 DIAGNOSIS — E119 Type 2 diabetes mellitus without complications: Secondary | ICD-10-CM | POA: Diagnosis not present

## 2018-06-20 DIAGNOSIS — E876 Hypokalemia: Secondary | ICD-10-CM | POA: Diagnosis not present

## 2018-06-20 DIAGNOSIS — M25571 Pain in right ankle and joints of right foot: Secondary | ICD-10-CM | POA: Diagnosis not present

## 2018-06-20 MED ORDER — LINAGLIPTIN 5 MG PO TABS: 5.0000 mg | ORAL_TABLET | Freq: Every day | ORAL | 1 refills | Status: DC

## 2018-06-20 MED ORDER — POTASSIUM CHLORIDE ER 10 MEQ PO TBCR: 10.0000 meq | EXTENDED_RELEASE_TABLET | Freq: Every day | ORAL | 1 refills | Status: DC

## 2018-06-20 MED ORDER — DICLOFENAC SODIUM 1 % TD GEL: 2.0000 g | Freq: Four times a day (QID) | TRANSDERMAL | 1 refills | Status: DC
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
## 2018-06-21 ENCOUNTER — Ambulatory Visit
Admission: RE | Admit: 2018-06-21 | Discharge: 2018-06-21 | Disposition: A | Discharge status: Home / Self Care | Payer: Medicare Other | Source: Ambulatory Visit | Attending: Nurse Practitioner | Admitting: Nurse Practitioner

## 2018-06-21 ENCOUNTER — Other Ambulatory Visit: Payer: Self-pay

## 2018-06-21 DIAGNOSIS — M25571 Pain in right ankle and joints of right foot: Secondary | ICD-10-CM

## 2018-06-26 ENCOUNTER — Other Ambulatory Visit: Payer: Self-pay | Admitting: Nurse Practitioner

## 2018-06-26 DIAGNOSIS — M25571 Pain in right ankle and joints of right foot: Secondary | ICD-10-CM

## 2018-07-03 ENCOUNTER — Encounter: Payer: Self-pay | Admitting: Nurse Practitioner

## 2018-07-10 ENCOUNTER — Telehealth: Payer: Self-pay | Admitting: Internal Medicine

## 2018-07-10 NOTE — Telephone Encounter (Signed)
I called the patient to schedule earlier AWV with Gabriella Acosta [facility] calendar year).  She preferred to do the AWV virtually instead of coming into the office. VDM (DD)

## 2018-07-23 ENCOUNTER — Telehealth: Payer: Self-pay

## 2018-07-23 ENCOUNTER — Ambulatory Visit (INDEPENDENT_AMBULATORY_CARE_PROVIDER_SITE_OTHER): Payer: Medicare Other

## 2018-07-23 ENCOUNTER — Other Ambulatory Visit: Payer: Self-pay

## 2018-07-23 ENCOUNTER — Ambulatory Visit: Payer: Medicare Other

## 2018-07-23 VITALS — Temp 97.5°F | Ht 64.0 in | Wt 130.0 lb

## 2018-07-23 DIAGNOSIS — Z0000 Encounter for general adult medical examination without abnormal findings: Secondary | ICD-10-CM

## 2018-07-23 NOTE — Telephone Encounter (Signed)
While doing AWV I found out patient had not gotten mammogram or bone density that was scheduled in October. This nurse called the [facility] and they stated that she had appointments scheduled for January 3rd that she no showed. [facility] states that they will reach back out to patient in order to reschedule.
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
## 2018-07-23 NOTE — Patient Instructions (Signed)
Gabriella Acosta , Thank you for taking time to come for your Medicare Wellness Visit. I appreciate your ongoing commitment to your health goals. Please review the following plan we discussed and let me know if I can assist you in the future.   Screening recommendations/referrals: Colonoscopy: declined Mammogram: ordered Bone Density: ordered Recommended yearly ophthalmology/optometry visit for glaucoma screening and checkup Recommended yearly dental visit for hygiene and checkup  Vaccinations: Influenza vaccine: declines Pneumococcal vaccine: declines Tdap vaccine: 07/2013 Shingles vaccine: discussed    Advanced directives: Advance directive discussed with you today.  Conditions/risks identified: smoking  Next appointment: 11/06/2018 at 10:00   Preventive Care 71 Years and Older, Female Preventive care refers to lifestyle choices and visits with your health care provider that can promote health and wellness. What does preventive care include?  A yearly physical exam. This is also called an annual well check.  Dental exams once or twice a year.  Routine eye exams. Ask your health care provider how often you should have your eyes checked.  Personal lifestyle choices, including:  Daily care of your teeth and gums.  Regular physical activity.  Eating a healthy diet.  Avoiding tobacco and drug use.  Limiting alcohol use.  Practicing safe sex.  Taking low-dose aspirin every day.  Taking vitamin and mineral supplements as recommended by your health care provider. What happens during an annual well check? The services and screenings done by your health care provider during your annual well check will depend on your age, overall health, lifestyle risk factors, and family history of disease. Counseling  Your health care provider may ask you questions about your:  Alcohol use.  Tobacco use.  Drug use.  Emotional well-being.  Home and relationship well-being.  Sexual  activity.  Eating habits.  History of falls.  Memory and ability to understand (cognition).  Work and work Statistician.  Reproductive health. Screening  You may have the following tests or measurements:  Height, weight, and BMI.  Blood pressure.  Lipid and cholesterol levels. These may be checked every 5 years, or more frequently if you are over 41 years old.  Skin check.  Lung cancer screening. You may have this screening every year starting at age 77 if you have a 30-pack-year history of smoking and currently smoke or have quit within the past 15 years.  Fecal occult blood test (FOBT) of the stool. You may have this test every year starting at age 40.  Flexible sigmoidoscopy or colonoscopy. You may have a sigmoidoscopy every 5 years or a colonoscopy every 10 years starting at age 48.  Hepatitis C blood test.  Hepatitis B blood test.  Sexually transmitted disease (STD) testing.  Diabetes screening. This is done by checking your blood sugar (glucose) after you have not eaten for a while (fasting). You may have this done every 1-3 years.  Bone density scan. This is done to screen for osteoporosis. You may have this done starting at age 27.  Mammogram. This may be done every 1-2 years. Talk to your health care provider about how often you should have regular mammograms. Talk with your health care provider about your test results, treatment options, and if necessary, the need for more tests. Vaccines  Your health care provider may recommend certain vaccines, such as:  Influenza vaccine. This is recommended every year.  Tetanus, diphtheria, and acellular pertussis (Tdap, Td) vaccine. You may need a Td booster every 10 years.  Zoster vaccine. You may need this after age 70.  Pneumococcal 13-valent conjugate (PCV13) vaccine. One dose is recommended after age 71.  Pneumococcal polysaccharide (PPSV23) vaccine. One dose is recommended after age 48. Talk to your health care  provider about which screenings and vaccines you need and how often you need them. This information is not intended to replace advice given to you by your health care provider. Make sure you discuss any questions you have with your health care provider. Document Released: 01/22/2015 Document Revised: 09/15/2015 Document Reviewed: 10/27/2014 Elsevier Interactive Patient Education  2017 [place] Prevention in the Home Falls can cause injuries. They can happen to people of all ages. There are many things you can do to make your home safe and to help prevent falls. What can I do on the outside of my home?  Regularly fix the edges of walkways and driveways and fix any cracks.  Remove anything that might make you trip as you walk through a door, such as a raised step or threshold.  Trim any bushes or trees on the path to your home.  Use bright outdoor lighting.  Clear any walking paths of anything that might make someone trip, such as rocks or tools.  Regularly check to see if handrails are loose or broken. Make sure that both sides of any steps have handrails.  Any raised decks and porches should have guardrails on the edges.  Have any leaves, snow, or ice cleared regularly.  Use sand or salt on walking paths during winter.  Clean up any spills in your garage right away. This includes oil or grease spills. What can I do in the bathroom?  Use night lights.  Install grab bars by the toilet and in the tub and shower. Do not use towel bars as grab bars.  Use non-skid mats or decals in the tub or shower.  If you need to sit down in the shower, use a plastic, non-slip stool.  Keep the floor dry. Clean up any water that spills on the floor as soon as it happens.  Remove soap buildup in the tub or shower regularly.  Attach bath mats securely with double-sided non-slip rug tape.  Do not have throw rugs and other things on the floor that can make you trip. What can I do in  the bedroom?  Use night lights.  Make sure that you have a light by your bed that is easy to reach.  Do not use any sheets or blankets that are too big for your bed. They should not hang down onto the floor.  Have a firm chair that has side arms. You can use this for support while you get dressed.  Do not have throw rugs and other things on the floor that can make you trip. What can I do in the kitchen?  Clean up any spills right away.  Avoid walking on wet floors.  Keep items that you use a lot in easy-to-reach places.  If you need to reach something above you, use a strong step stool that has a grab bar.  Keep electrical cords out of the way.  Do not use floor polish or wax that makes floors slippery. If you must use wax, use non-skid floor wax.  Do not have throw rugs and other things on the floor that can make you trip. What can I do with my stairs?  Do not leave any items on the stairs.  Make sure that there are handrails on both sides of the stairs and use them.  Fix handrails that are broken or loose. Make sure that handrails are as long as the stairways.  Check any carpeting to make sure that it is firmly attached to the stairs. Fix any carpet that is loose or worn.  Avoid having throw rugs at the top or bottom of the stairs. If you do have throw rugs, attach them to the floor with carpet tape.  Make sure that you have a light switch at the top of the stairs and the bottom of the stairs. If you do not have them, ask someone to add them for you. What else can I do to help prevent falls?  Wear shoes that:  Do not have high heels.  Have rubber bottoms.  Are comfortable and fit you well.  Are closed at the toe. Do not wear sandals.  If you use a stepladder:  Make sure that it is fully opened. Do not climb a closed stepladder.  Make sure that both sides of the stepladder are locked into place.  Ask someone to hold it for you, if possible.  Clearly mark and  make sure that you can see:  Any grab bars or handrails.  First and last steps.  Where the edge of each step is.  Use tools that help you move around (mobility aids) if they are needed. These include:  Canes.  Walkers.  Scooters.  Crutches.  Turn on the lights when you go into a dark area. Replace any light bulbs as soon as they burn out.  Set up your furniture so you have a clear path. Avoid moving your furniture around.  If any of your floors are uneven, fix them.  If there are any pets around you, be aware of where they are.  Review your medicines with your doctor. Some medicines can make you feel dizzy. This can increase your chance of falling. Ask your doctor what other things that you can do to help prevent falls. This information is not intended to replace advice given to you by your health care provider. Make sure you discuss any questions you have with your health care provider. Document Released: 10/22/2008 Document Revised: 06/03/2015 Document Reviewed: 01/30/2014 Elsevier Interactive Patient Education  2017 [employer].

## 2018-10-01 ENCOUNTER — Encounter: Payer: Self-pay | Admitting: Nurse Practitioner

## 2018-10-01 ENCOUNTER — Telehealth (INDEPENDENT_AMBULATORY_CARE_PROVIDER_SITE_OTHER): Payer: Medicare Other | Admitting: Nurse Practitioner

## 2018-10-01 ENCOUNTER — Other Ambulatory Visit: Payer: Self-pay

## 2018-10-01 VITALS — BP 130/85 | Temp 97.1°F

## 2018-10-01 DIAGNOSIS — E876 Hypokalemia: Secondary | ICD-10-CM | POA: Diagnosis not present

## 2018-10-01 DIAGNOSIS — Z9114 Patient's other noncompliance with medication regimen: Secondary | ICD-10-CM

## 2018-10-01 DIAGNOSIS — E119 Type 2 diabetes mellitus without complications: Secondary | ICD-10-CM

## 2018-10-01 DIAGNOSIS — R634 Abnormal weight loss: Secondary | ICD-10-CM | POA: Diagnosis not present

## 2018-10-01 DIAGNOSIS — E782 Mixed hyperlipidemia: Secondary | ICD-10-CM | POA: Diagnosis not present

## 2018-10-01 DIAGNOSIS — I10 Essential (primary) hypertension: Secondary | ICD-10-CM

## 2018-10-01 MED ORDER — AMLODIPINE BESYLATE 5 MG PO TABS: 5.0000 mg | ORAL_TABLET | Freq: Every day | ORAL | 0 refills | Status: DC
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
## 2018-10-02 ENCOUNTER — Ambulatory Visit
Admission: RE | Admit: 2018-10-02 | Discharge: 2018-10-02 | Disposition: A | Discharge status: Home / Self Care | Payer: Medicare Other | Source: Ambulatory Visit | Attending: Nurse Practitioner | Admitting: Nurse Practitioner

## 2018-10-02 ENCOUNTER — Other Ambulatory Visit: Payer: Self-pay

## 2018-10-02 ENCOUNTER — Encounter: Payer: Self-pay | Admitting: Nurse Practitioner

## 2018-10-02 ENCOUNTER — Other Ambulatory Visit: Payer: Medicare Other

## 2018-10-02 DIAGNOSIS — E119 Type 2 diabetes mellitus without complications: Secondary | ICD-10-CM

## 2018-10-02 DIAGNOSIS — E782 Mixed hyperlipidemia: Secondary | ICD-10-CM

## 2018-10-02 DIAGNOSIS — R634 Abnormal weight loss: Secondary | ICD-10-CM

## 2018-10-02 DIAGNOSIS — Z20822 Contact with and (suspected) exposure to covid-19: Secondary | ICD-10-CM

## 2018-10-03 ENCOUNTER — Other Ambulatory Visit: Payer: Self-pay

## 2018-10-03 LAB — CBC WITH DIFFERENTIAL/PLATELET
Basophils Absolute: 0.1 10*3/uL (ref 0.0–0.2)
Basos: 1 %
EOS (ABSOLUTE): 0.1 10*3/uL (ref 0.0–0.4)
Eos: 1 %
Hematocrit: 41 % (ref 34.0–46.6)
Hemoglobin: 13.8 g/dL (ref 11.1–15.9)
Immature Grans (Abs): 0 10*3/uL (ref 0.0–0.1)
Immature Granulocytes: 0 %
Lymphocytes Absolute: 1.5 10*3/uL (ref 0.7–3.1)
Lymphs: 16 %
MCH: 30 pg (ref 26.6–33.0)
MCHC: 33.7 g/dL (ref 31.5–35.7)
MCV: 89 fL (ref 79–97)
Monocytes Absolute: 0.4 10*3/uL (ref 0.1–0.9)
Monocytes: 4 %
Neutrophils Absolute: 7.7 10*3/uL — ABNORMAL HIGH (ref 1.4–7.0)
Neutrophils: 78 %
Platelets: 103 10*3/uL — ABNORMAL LOW (ref 150–450)
RBC: 4.6 x10E6/uL (ref 3.77–5.28)
RDW: 11.9 % (ref 11.7–15.4)
WBC: 9.8 10*3/uL (ref 3.4–10.8)

## 2018-10-03 LAB — CMP14 + ANION GAP
ALT: 17 IU/L (ref 0–32)
AST: 19 IU/L (ref 0–40)
Albumin/Globulin Ratio: 1.4 (ref 1.2–2.2)
Albumin: 4.6 g/dL (ref 3.7–4.7)
Alkaline Phosphatase: 168 IU/L — ABNORMAL HIGH (ref 39–117)
Anion Gap: 18 mmol/L (ref 10.0–18.0)
BUN/Creatinine Ratio: 18 (ref 12–28)
BUN: 15 mg/dL (ref 8–27)
Bilirubin Total: 0.5 mg/dL (ref 0.0–1.2)
CO2: 22 mmol/L (ref 20–29)
Calcium: 10.7 mg/dL — ABNORMAL HIGH (ref 8.7–10.3)
Chloride: 93 mmol/L — ABNORMAL LOW (ref 96–106)
Creatinine, Ser: 0.82 mg/dL (ref 0.57–1.00)
GFR calc Af Amer: 83 mL/min/{1.73_m2} (ref >59)
GFR calc non Af Amer: 72 mL/min/{1.73_m2} (ref >59)
Globulin, Total: 3.2 g/dL (ref 1.5–4.5)
Glucose: 467 mg/dL — ABNORMAL HIGH (ref 65–99)
Potassium: 4.9 mmol/L (ref 3.5–5.2)
Sodium: 133 mmol/L — ABNORMAL LOW (ref 134–144)
Total Protein: 7.8 g/dL (ref 6.0–8.5)

## 2018-10-03 LAB — LIPID PANEL
Chol/HDL Ratio: 6.7 ratio — ABNORMAL HIGH (ref 0.0–4.4)
Cholesterol, Total: 288 mg/dL — ABNORMAL HIGH (ref 100–199)
HDL: 43 mg/dL (ref >39)
LDL Chol Calc (NIH): 158 mg/dL — ABNORMAL HIGH (ref 0–99)
Triglycerides: 452 mg/dL — ABNORMAL HIGH (ref 0–149)
VLDL Cholesterol Cal: 87 mg/dL — ABNORMAL HIGH (ref 5–40)

## 2018-10-03 LAB — HEMOGLOBIN A1C
Est. average glucose Bld gHb Est-mCnc: 258 mg/dL
Hgb A1c MFr Bld: 10.6 % — ABNORMAL HIGH (ref 4.8–5.6)

## 2018-10-03 MED ORDER — BLOOD GLUCOSE MONITORING SUPPL DEVI: 0 refills | Status: AC

## 2018-10-04 LAB — NOVEL CORONAVIRUS, NAA: SARS-CoV-2, NAA: NOT DETECTED

## 2018-10-07 ENCOUNTER — Other Ambulatory Visit: Payer: Self-pay

## 2018-10-07 ENCOUNTER — Ambulatory Visit: Payer: Medicare Other

## 2018-10-07 ENCOUNTER — Other Ambulatory Visit: Payer: Self-pay | Admitting: Nurse Practitioner

## 2018-10-07 VITALS — BP 130/82 | HR 80 | Temp 98.0°F | Ht 64.0 in | Wt 132.0 lb

## 2018-10-07 DIAGNOSIS — E119 Type 2 diabetes mellitus without complications: Secondary | ICD-10-CM

## 2018-10-07 MED ORDER — PEN NEEDLES 32G X 4 MM MISC: 1.0000 | Freq: Every day | 2 refills | Status: DC

## 2018-10-07 MED ORDER — ROSUVASTATIN CALCIUM 10 MG PO TABS: 10.0000 mg | ORAL_TABLET | Freq: Every day | ORAL | 2 refills | Status: DC

## 2018-10-07 NOTE — Progress Notes (Signed)
Patient presented today for a nurse visit.

## 2018-10-07 NOTE — Progress Notes (Signed)
I have renewed rosuvastatin one tab daily

## 2018-10-09 ENCOUNTER — Other Ambulatory Visit: Payer: Self-pay

## 2018-10-09 MED ORDER — ACCU-CHEK SOFT TOUCH LANCETS MISC: 12 refills | Status: DC

## 2018-10-09 MED ORDER — ACCU-CHEK GUIDE VI STRP: ORAL_STRIP | 12 refills | Status: DC

## 2018-10-09 NOTE — Progress Notes (Signed)
accu

## 2018-11-06 ENCOUNTER — Encounter: Payer: Medicare Other | Admitting: Internal Medicine

## 2018-11-06 ENCOUNTER — Ambulatory Visit: Payer: Medicare Other | Admitting: Internal Medicine

## 2018-11-06 ENCOUNTER — Ambulatory Visit: Payer: Medicare Other

## 2018-11-07 ENCOUNTER — Other Ambulatory Visit: Payer: Self-pay

## 2018-11-07 ENCOUNTER — Ambulatory Visit (INDEPENDENT_AMBULATORY_CARE_PROVIDER_SITE_OTHER): Payer: Medicare Other | Admitting: Internal Medicine

## 2018-11-07 ENCOUNTER — Other Ambulatory Visit: Payer: Self-pay | Admitting: Nurse Practitioner

## 2018-11-07 ENCOUNTER — Telehealth: Payer: Self-pay

## 2018-11-07 ENCOUNTER — Encounter: Payer: Self-pay | Admitting: Internal Medicine

## 2018-11-07 ENCOUNTER — Other Ambulatory Visit: Payer: Self-pay | Admitting: Internal Medicine

## 2018-11-07 VITALS — BP 122/68 | HR 67 | Temp 98.2°F | Wt 112.0 lb

## 2018-11-07 DIAGNOSIS — I10 Essential (primary) hypertension: Secondary | ICD-10-CM | POA: Diagnosis not present

## 2018-11-07 DIAGNOSIS — R4189 Other symptoms and signs involving cognitive functions and awareness: Secondary | ICD-10-CM

## 2018-11-07 DIAGNOSIS — E1165 Type 2 diabetes mellitus with hyperglycemia: Secondary | ICD-10-CM | POA: Diagnosis not present

## 2018-11-07 MED ORDER — LINAGLIPTIN 5 MG PO TABS: 5.0000 mg | ORAL_TABLET | Freq: Every day | ORAL | 0 refills | Status: DC

## 2018-11-07 MED ORDER — TRESIBA FLEXTOUCH 100 UNIT/ML ~~LOC~~ SOPN: 15.0000 [IU] | PEN_INJECTOR | Freq: Every day | SUBCUTANEOUS | 1 refills | Status: DC

## 2018-11-07 NOTE — Telephone Encounter (Signed)
Spoke with Gabriella Acosta gave her provider message, per [name] Gabriella Acosta needs to come pick up Tresiba samples Gabriella Acosta stated she would come pick some up now   Please call Gabriella Acosta and inform her that I spoke with [name] and she recommends for her to d/c the Ozempic, and increase her Tresiba to 15 units. Needs to continue checking her glucose, avoiding sweets, breads, pasta, rice and eat vegetables and some kind of meat with it. If she continues drinking ensure, she needs to buy Glucerna which is for diabetics. She was supposed to be here for a physical today, so she needs to reschedule this and [name] said she will see her back in one month. Please tell her she needs to bring her glucometer for [place] to see her readings.
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
## 2018-11-07 NOTE — Progress Notes (Unsigned)
Please call pt and inform her that I spoke with [facility] and she recommends for her to d/c the Ozempic, and increase her Tresiba to 15 units. Needs to continue checking her glucose, avoiding sweets, breads, pasta, rice and eat vegetables and some kind of meat with it. If she continues drinking ensure, she needs to buy Glucerna which is for diabetics. She was supposed to be here for a physical today, so she needs to reschedule this and [name] said she will see her back in one month. Please tell her she needs to bring her glucometer for [place] to see her readings.

## 2018-11-07 NOTE — Telephone Encounter (Signed)
Pt picked up 2 boxws of Tresiba samples stated she is aware on how to take it

## 2018-11-08 ENCOUNTER — Ambulatory Visit: Payer: Self-pay

## 2018-11-08 DIAGNOSIS — E1165 Type 2 diabetes mellitus with hyperglycemia: Secondary | ICD-10-CM

## 2018-11-08 DIAGNOSIS — I10 Essential (primary) hypertension: Secondary | ICD-10-CM

## 2018-11-08 NOTE — Chronic Care Management (AMB) (Signed)
  Chronic Care Management   Outreach Note  11/08/2018 Name: Gabriella Acosta MRN: [id number] DOB: 16-Jun-1945  Referred by: Laurita Quint Reason for referral : Care Coordination   An unsuccessful telephone outreach was attempted today. The patient was referred to the case management team by for assistance with care management and care coordination.   Follow Up Plan: Patients voice mailbox full, unable to leave a voice message. SW will outreach the patient again over the next 10 days.  Daneen Schick, BSW, CDP Social Worker, Certified Dementia Practitioner [place] [facility] [place] [facility] [phone]

## 2018-11-14 ENCOUNTER — Ambulatory Visit: Payer: Self-pay

## 2018-11-14 DIAGNOSIS — E1165 Type 2 diabetes mellitus with hyperglycemia: Secondary | ICD-10-CM

## 2018-11-14 DIAGNOSIS — I10 Essential (primary) hypertension: Secondary | ICD-10-CM

## 2018-11-14 NOTE — Chronic Care Management (AMB) (Signed)
  Chronic Care Management   Social Work General Note  11/14/2018 Name: Gabriella Acosta MRN: [id number] DOB: 1945/10/06  Gabriella Acosta is a 73 y.o. year old female who is a primary care patient of Gabriella Acosta. The CCM was consulted to assist the patient with disease management.   Gabriella Acosta was given information about Chronic Care Management services today including:  1. CCM service includes personalized support from designated clinical staff supervised by her physician, including individualized plan of care and coordination with other care providers 2. 24/7 contact phone numbers for assistance for urgent and routine care needs. 3. Service will only be billed when office clinical staff spend 20 minutes or more in a month to coordinate care. 4. Only one practitioner may furnish and bill the service in a calendar month. 5. The patient may stop CCM services at any time (effective at the end of the month) by phone call to the office staff. 6. The patient will be responsible for cost sharing (co-pay) of up to 20% of the service fee (after annual deductible is met).  Patient did not agree to enrollment in care management services and does not wish to consider at this time.  Outpatient Encounter Medications as of 11/14/2018  Medication Sig  . amLODipine (NORVASC) 5 MG tablet Take 1 tablet (5 mg total) by mouth daily.  . Blood Glucose Monitoring Suppl DEVI Check blood sugars twice daily. E11.9  . diclofenac sodium (VOLTAREN) 1 % GEL Apply 2 g topically 4 (four) times daily.  [name] glucose blood (ACCU-CHEK GUIDE) test strip Use as instructed  . insulin degludec (TRESIBA FLEXTOUCH) 100 UNIT/ML SOPN FlexTouch Pen Inject 0.15 mLs (15 Units total) into the skin daily.  . Insulin Pen Needle (PEN NEEDLES) 32G X 4 MM MISC 1 Stick by Does not apply route daily.  . Lancets (ACCU-CHEK SOFT TOUCH) lancets Use as instructed  . linagliptin (TRADJENTA) 5 MG TABS tablet Take 1 tablet (5 mg total) by  mouth daily.  . magnesium 30 MG tablet Take 30 mg by mouth once a week.  . meloxicam (MOBIC) 15 MG tablet TK 1 T PO QD AS NEEDED WITH FOOD  . potassium chloride (KLOR-CON 10) 10 MEQ tablet Take 1 tablet (10 mEq total) by mouth daily.  . rosuvastatin (CRESTOR) 10 MG tablet Take 1 tablet (10 mg total) by mouth daily.  [name] telmisartan (MICARDIS) 20 MG tablet TAKE 1 TABLET BY MOUTH EVERY DAY   No facility-administered encounter medications on file as of 11/14/2018.     Follow Up Plan: No further follow up planned as the patient declined service.       Daneen Schick, BSW, CDP Social Worker, Certified Dementia Practitioner [place] [facility] [place] [facility] [phone]

## 2018-11-19 ENCOUNTER — Telehealth: Payer: Self-pay

## 2018-11-19 NOTE — Telephone Encounter (Signed)
Pt LVM she is out of [place] per [place] pt can come pick up another sample box. I also made pt aware that CCM has been trying to reach her for assistants with medication. Pt stated she was not aware that was what they was calling for

## 2018-11-20 ENCOUNTER — Ambulatory Visit: Payer: Self-pay

## 2018-11-20 ENCOUNTER — Telehealth: Payer: Self-pay

## 2018-11-20 ENCOUNTER — Ambulatory Visit: Payer: Self-pay | Admitting: Pharmacist

## 2018-11-20 DIAGNOSIS — I10 Essential (primary) hypertension: Secondary | ICD-10-CM

## 2018-11-20 DIAGNOSIS — E1165 Type 2 diabetes mellitus with hyperglycemia: Secondary | ICD-10-CM

## 2018-11-20 NOTE — Patient Instructions (Signed)
Social Worker Visit Information  Materials provided: Verbal education about CCM program provided by phone.  Ms. Baccari was given information about Chronic Care Management services today including:  1. CCM service includes personalized support from designated clinical staff supervised by her physician, including individualized plan of care and coordination with other care providers 2. 24/7 contact phone numbers for assistance for urgent and routine care needs. 3. Service will only be billed when office clinical staff spend 20 minutes or more in a month to coordinate care. 4. Only one practitioner may furnish and bill the service in a calendar month. 5. The patient may stop CCM services at any time (effective at the end of the month) by phone call to the office staff. 6. The patient will be responsible for cost sharing (co-pay) of up to 20% of the service fee (after annual deductible is met).  Patient agreed to services and verbal consent obtained.   The patient verbalized understanding of instructions provided today and declined a print copy of patient instruction materials.   Follow up plan: No SW follow up planned at this time. SW has collaborated with embedded PharmD and RN Case Freight forwarder regarding patient enrollment and identified needs.   Daneen Schick, BSW, CDP Social Worker, Certified Dementia Practitioner [place] [facility] [place] [facility] [phone]

## 2018-11-20 NOTE — Telephone Encounter (Signed)
Gabriella Acosta, just wanted to send you the messages between me and [name]. [name] did pick up samples yesterday. I did let her know that [name] will reach back out to her, and I let her know what it was for.   [place] I spoke with [name] she wasn't aware of the reason you all were calling, she asked if you can call her again. She is out of [place] but will come to pick up a sample box today  I am happy to call her again. Unfortunately, her insurance does have a $20 co-pay which is why she declined if I remember correctly. Will reach out to her in the next week.  Ok thank you, I'll let [name] know

## 2018-11-20 NOTE — Telephone Encounter (Signed)
Is it $20 co pay for each meeting with each discipline or just one copay

## 2018-11-20 NOTE — Telephone Encounter (Signed)
Okay thank you

## 2018-11-20 NOTE — Telephone Encounter (Signed)
Per [name] Its a monthly copay amount if we work with her 20 minutes that month. I have contacted patient and she has enrolled. I do not plan to work with her to limit time on the phone and allow Almyra Free to work on med assistance. The patient understands she can decline service once med assistance approved to limit monthly copay amount that will be billed to her after insurance. Almyra Free will reach out to her next month to help with cost saving

## 2018-11-20 NOTE — Chronic Care Management (AMB) (Signed)
  Care Management Note   Gabriella Acosta is a 73 y.o. year old female who is a primary care patient of [name], [place] . The CM team was consulted for assistance with medication assistance.   Review of patient status, including review of consultants reports, rand collaboration with appropriate care team members and the patient's provider was performed as part of comprehensive patient evaluation and provision of care management services. Telephone outreach to patient today to introduce CM services.   SW placed an outbound call to the patient in response to request received from [name] FNP regarding enrollment for patient assistance due to [place] costs.  SDOH (Social Determinants of Health) screening performed today: Financial Strain . SW provided patient with contact number to [employer] to assess eligibility to qualify for [employer].  Outpatient Encounter Medications as of 11/20/2018  Medication Sig  . amLODipine (NORVASC) 5 MG tablet Take 1 tablet (5 mg total) by mouth daily.  . Blood Glucose Monitoring Suppl DEVI Check blood sugars twice daily. E11.9  . diclofenac sodium (VOLTAREN) 1 % GEL Apply 2 g topically 4 (four) times daily.  [name] glucose blood (ACCU-CHEK GUIDE) test strip Use as instructed  . insulin degludec (TRESIBA FLEXTOUCH) 100 UNIT/ML SOPN FlexTouch Pen Inject 0.15 mLs (15 Units total) into the skin daily.  . Insulin Pen Needle (PEN NEEDLES) 32G X 4 MM MISC 1 Stick by Does not apply route daily.  . Lancets (ACCU-CHEK SOFT TOUCH) lancets Use as instructed  . linagliptin (TRADJENTA) 5 MG TABS tablet Take 1 tablet (5 mg total) by mouth daily.  . magnesium 30 MG tablet Take 30 mg by mouth once a week.  . meloxicam (MOBIC) 15 MG tablet TK 1 T PO QD AS NEEDED WITH FOOD  . potassium chloride (KLOR-CON 10) 10 MEQ tablet Take 1 tablet (10 mEq total) by mouth daily.  . rosuvastatin (CRESTOR) 10 MG tablet Take 1 tablet (10 mg total) by mouth daily.  [name]  telmisartan (MICARDIS) 20 MG tablet TAKE 1 TABLET BY MOUTH EVERY DAY   No facility-administered encounter medications on file as of 11/20/2018.     I reached out to [employer] by phone today.   Ms. Rosado was given information about Chronic Care Management services today including:  1. CCM service includes personalized support from designated clinical staff supervised by her physician, including individualized plan of care and coordination with other care providers 2. 24/7 contact phone numbers for assistance for urgent and routine care needs. 3. Service will only be billed when office clinical staff spend 20 minutes or more in a month to coordinate care. 4. Only one practitioner may furnish and bill the service in a calendar month. 5. The patient may stop CCM services at any time (effective at the end of the month) by phone call to the office staff. 6. The patient will be responsible for cost sharing (co-pay) of up to 20% of the service fee (after annual deductible is met).   Patient agreed to services and verbal consent obtained.    Follow Up Plan: No further SW follow up planned at this time. SW has communicated patient enrollment and referral reason to RN Case Manager and embedded PharmD who will follow up with the patient over the next 28 days.   Daneen Schick, BSW, CDP Social Worker, Certified Dementia Practitioner [place] [facility] [place] [facility] [phone]

## 2018-11-22 ENCOUNTER — Ambulatory Visit: Payer: Self-pay | Admitting: Pharmacist

## 2018-11-22 DIAGNOSIS — I10 Essential (primary) hypertension: Secondary | ICD-10-CM

## 2018-11-22 DIAGNOSIS — E1165 Type 2 diabetes mellitus with hyperglycemia: Secondary | ICD-10-CM

## 2018-11-25 ENCOUNTER — Ambulatory Visit: Payer: Self-pay | Admitting: Pharmacist

## 2018-11-25 DIAGNOSIS — I10 Essential (primary) hypertension: Secondary | ICD-10-CM

## 2018-11-25 DIAGNOSIS — E1165 Type 2 diabetes mellitus with hyperglycemia: Secondary | ICD-10-CM

## 2018-11-26 NOTE — Progress Notes (Signed)
Chronic Care Management   Initial Visit Note  11/20/2018 Name: Gabriella Acosta MRN: [id number] DOB: Nov 11, 1945  Referred by: Gabriella Brine, FNP Reson for referral : Chronic Care Management   Gabriella Acosta is a 73 y.o. year old female who is a primary care patient of Gabriella Acosta, [place]. The CCM team was consulted for assistance with chronic disease management and care coordination needs related to HLD and DMII  Review of patient status, including review of consultants reports, relevant laboratory and other test results, and collaboration with appropriate care team members and the patient's provider was performed as part of comprehensive patient evaluation and provision of chronic care management services.    I spoke with Ms. Gabriella Acosta by telephone today.  Medications: Outpatient Encounter Medications as of 11/20/2018  Medication Sig  . amLODipine (NORVASC) 5 MG tablet Take 1 tablet (5 mg total) by mouth daily.  . Blood Glucose Monitoring Suppl DEVI Check blood sugars twice daily. E11.9  . diclofenac sodium (VOLTAREN) 1 % GEL Apply 2 g topically 4 (four) times daily.  Gabriella Acosta glucose blood (ACCU-CHEK GUIDE) test strip Use as instructed  . insulin degludec (TRESIBA FLEXTOUCH) 100 UNIT/ML SOPN FlexTouch Pen Inject 0.15 mLs (15 Units total) into the skin daily.  . Insulin Pen Needle (PEN NEEDLES) 32G X 4 MM MISC 1 Stick by Does not apply route daily.  . Lancets (ACCU-CHEK SOFT TOUCH) lancets Use as instructed  . linagliptin (TRADJENTA) 5 MG TABS tablet Take 1 tablet (5 mg total) by mouth daily.  . magnesium 30 MG tablet Take 30 mg by mouth once a week.  . meloxicam (MOBIC) 15 MG tablet TK 1 T PO QD AS NEEDED WITH FOOD  . potassium chloride (KLOR-CON 10) 10 MEQ tablet Take 1 tablet (10 mEq total) by mouth daily.  . rosuvastatin (CRESTOR) 10 MG tablet Take 1 tablet (10 mg total) by mouth daily. (Patient not taking: Reported on 11/26/2018)  . telmisartan (MICARDIS) 20 MG tablet TAKE 1 TABLET  BY MOUTH EVERY DAY   No facility-administered encounter medications on file as of 11/20/2018.      Objective:   Goals Addressed            This Visit's Progress     Patient Stated   . I would like to apply for medicaid (pt-stated)       Current Barriers:  . Financial Barriers  Pharmacist Clinical Goal(s):  Gabriella Acosta Over the next 60 days, patient will work with CCM team and PCP to apply for Medicaid to address needs related to d  Interventions: . Comprehensive medication review performed. . Collaboration with provider re: medication management . Referred to Clinic Social Worker for [employer] application assistance . Number given to patient for [facility] DSS--patient states she called and application status is pending.  Patient states she is brining in less money per month since she is out of work . Case discussed with CCM BSW, Daneen Schick.  Appreciate assistance.  Patient Self Care Activities:  . Self administers medications as prescribed . Attends all scheduled provider appointments . Calls pharmacy for medication refills . Calls provider office for new concerns or questions  Initial goal documentation     . I would like to control my diabetes and chronic conditions (pt-stated)       Current Barriers:  . Diabetes: T2DM; most recent A1c 10.6% on 10/02/18  . Current antihyperglycemic regimen: Tresiba 15 units qHS, Tradjenta . Denies hypoglycemic symptoms; denies hyperglycemic symptoms . Current meal patterns:  will discuss . Current exercise: walking . Current blood glucose readings: FBG 112, 119, 124 . Cardiovascular risk reduction: o Current hypertensive regimen: telmisartan, amlodipine o Current hyperlipidemia regimen: rosuvastatin (not taking due to cost-applying for medicaid)  Pharmacist Clinical Goal(s):  Gabriella Acosta Over the next 90 days, patient with work with PharmD and primary care provider to address needs related to optimization of medication management of chronic  conditions (diabetes)  Interventions: . Comprehensive medication review performed, medication list updated in electronic medical record . Discussed the importance of diet & exercise . Application for medicaid completed-->will adjust diabetic medications once access issues are resolved  Patient Self Care Activities:  . Patient will check blood glucose daily, document, and provide at future appointments . Patient will focus on medication adherence by continuing to take medications as prescribed . Patient will take medications as prescribed . Patient will contact provider with any episodes of hypoglycemia . Patient will report any questions or concerns to provider   Initial goal documentation        Plan:   The care management team will reach out to the patient again over the next 5 days.   Provider Signature Kieth Brightly, PharmD, BCPS Clinical Pharmacist, [facility]  [facility]  Direct Dial: [phone]

## 2018-11-26 NOTE — Progress Notes (Signed)
  Chronic Care Management   Outreach Note  11/22/2018 Name: Agueda Houpt MRN: [id number] DOB: [birth year]  Referred by: [name], FNP Reason for referral : Chronic Care Management   Successful outreach call to Ms. Dixon-Snead with HIPAA identifiers verified.   Follow Up Plan: Face to Face appointment with care management team member scheduled for:  11/25/2018 at [place] Brantlee Hinde, PharmD, BCPS Clinical Pharmacist, [place]: [phone]

## 2018-11-26 NOTE — Patient Instructions (Signed)
Visit Information  Goals Addressed            This Visit's Progress     Patient Stated   . I would like to apply for medicaid (pt-stated)       Current Barriers:  . Financial Barriers  Pharmacist Clinical Goal(s):  [name] Over the next 60 days, patient will work with CCM team and PCP to apply for Medicaid to address needs related to d  Interventions: . Comprehensive medication review performed. . Collaboration with provider re: medication management . Referred to Clinic Social Worker for [employer] application assistance . Number given to patient for [facility] DSS--patient states she called and application status is pending.  Patient states she is brining in less money per month since she is out of work . Case discussed with CCM BSW, Daneen Schick.  Appreciate assistance.  Patient Self Care Activities:  . Self administers medications as prescribed . Attends all scheduled provider appointments . Calls pharmacy for medication refills . Calls provider office for new concerns or questions  Initial goal documentation     . I would like to control my diabetes and chronic conditions (pt-stated)       Current Barriers:  . Diabetes: T2DM; most recent A1c 10.6% on 10/02/18  . Current antihyperglycemic regimen: Tresiba 15 units qHS, Tradjenta . Denies hypoglycemic symptoms; denies hyperglycemic symptoms . Current meal patterns: will discuss . Current exercise: walking . Current blood glucose readings: FBG 112, 119, 124 . Cardiovascular risk reduction: o Current hypertensive regimen: telmisartan, amlodipine o Current hyperlipidemia regimen: rosuvastatin (not taking due to cost-applying for medicaid)  Pharmacist Clinical Goal(s):  [name] Over the next 90 days, patient with work with PharmD and primary care provider to address needs related to optimization of medication management of chronic conditions (diabetes)  Interventions: . Comprehensive medication review performed, medication list  updated in electronic medical record . Discussed the importance of diet & exercise . Application for medicaid completed-->will adjust diabetic medications once access issues are resolved  Patient Self Care Activities:  . Patient will check blood glucose daily, document, and provide at future appointments . Patient will focus on medication adherence by continuing to take medications as prescribed . Patient will take medications as prescribed . Patient will contact provider with any episodes of hypoglycemia . Patient will report any questions or concerns to provider   Initial goal documentation        The patient verbalized understanding of instructions provided today and declined a print copy of patient instruction materials.   The care management team will reach out to the patient again over the next 5 days.   SIGNATURE Regina Eck, PharmD, BCPS Clinical Pharmacist, [facility] [place]: [phone]

## 2018-11-29 NOTE — Progress Notes (Signed)
Chronic Care Management   Initial Visit Note  11/25/2018 Name: Gabriella Acosta MRN: [id number] DOB: 1945-07-09  Referred by: [name], FNP Reason for referral : Chronic Care Management   Gabriella Acosta is a 73 y.o. year old female who is a primary care patient of [name], [place]. The CCM team was consulted for assistance with chronic disease management and care coordination needs related to DMII  Review of patient status, including review of consultants reports, relevant laboratory and other test results, and collaboration with appropriate care team members and the patient's provider was performed as part of comprehensive patient evaluation and provision of chronic care management services.    I met with Gabriella Acosta in clinic today  Medications: Outpatient Encounter Medications as of 11/25/2018  Medication Sig  . amLODipine (NORVASC) 5 MG tablet Take 1 tablet (5 mg total) by mouth daily.  . Blood Glucose Monitoring Suppl DEVI Check blood sugars twice daily. E11.9  . diclofenac sodium (VOLTAREN) 1 % GEL Apply 2 g topically 4 (four) times daily.  [name] glucose blood (ACCU-CHEK GUIDE) test strip Use as instructed  . insulin degludec (TRESIBA FLEXTOUCH) 100 UNIT/ML SOPN FlexTouch Pen Inject 0.15 mLs (15 Units total) into the skin daily.  . Insulin Pen Needle (PEN NEEDLES) 32G X 4 MM MISC 1 Stick by Does not apply route daily.  . Lancets (ACCU-CHEK SOFT TOUCH) lancets Use as instructed  . linagliptin (TRADJENTA) 5 MG TABS tablet Take 1 tablet (5 mg total) by mouth daily.  . magnesium 30 MG tablet Take 30 mg by mouth once a week.  . meloxicam (MOBIC) 15 MG tablet TK 1 T PO QD AS NEEDED WITH FOOD  . potassium chloride (KLOR-CON 10) 10 MEQ tablet Take 1 tablet (10 mEq total) by mouth daily.  . rosuvastatin (CRESTOR) 10 MG tablet Take 1 tablet (10 mg total) by mouth daily. (Patient not taking: Reported on 11/26/2018)  . telmisartan (MICARDIS) 20 MG tablet TAKE 1 TABLET BY MOUTH EVERY  DAY   No facility-administered encounter medications on file as of 11/25/2018.      Objective:   Goals Addressed            This Visit's Progress     Patient Stated   . I would like to apply for medicaid (pt-stated)       Current Barriers:  . Financial Barriers  Pharmacist Clinical Goal(s):  [name] Over the next 60 days, patient will work with CCM team and PCP to apply for Medicaid to address needs related to d  Interventions: . Comprehensive medication review performed. . Collaboration with provider re: medication management . Referred to Clinic Social Worker for [employer] application assistance . Number given to patient for [facility] DSS--patient states she called and application status is pending.  Patient states she is brining in less money per month since she is out of work.  Patient provided with samples until application status determined.  Discussed patient assistance with patient and filled out the necessary paperwork if PAP needed. . Case discussed with CCM BSW, Kendra Humble.  Appreciate assistance.  Patient Self Care Activities:  . Self administers medications as prescribed . Attends all scheduled provider appointments . Calls pharmacy for medication refills . Calls provider office for new concerns or questions  Please see past updates related to this goal by clicking on the "Past Updates" button in the selected goal      . I would like to control my diabetes and chronic conditions (pt-stated)  Current Barriers:  . Diabetes: T2DM; most recent A1c 10.6% on 10/02/18  . Current antihyperglycemic regimen: Tresiba 15 units qHS, Tradjenta o Samples provided for patient Tyler Aas and Tradjenta) o Will f/u Medicaid outcome o May apply for [employer] patient assistance if Medicaid not obtained . Denies hypoglycemic symptoms; denies hyperglycemic symptoms . Current meal patterns: will discuss . Current exercise: walking . Current blood glucose readings: FBG 112, 119,  124 . Cardiovascular risk reduction: o Current hypertensive regimen: telmisartan, amlodipine o Current hyperlipidemia regimen: rosuvastatin (not taking due to cost-applying for medicaid)  Pharmacist Clinical Goal(s):  [name] Over the next 90 days, patient with work with PharmD and primary care provider to address needs related to optimization of medication management of chronic conditions (diabetes)  Interventions: . Comprehensive medication review performed, medication list updated in electronic medical record . Discussed the importance of diet & exercise . Application for medicaid completed-->will adjust diabetic medications once access issues are resolved  Patient Self Care Activities:  . Patient will check blood glucose daily, document, and provide at future appointments . Patient will focus on medication adherence by continuing to take medications as prescribed . Patient will take medications as prescribed . Patient will contact provider with any episodes of hypoglycemia . Patient will report any questions or concerns to provider   Please see past updates related to this goal by clicking on the "Past Updates" button in the selected goal          Plan:   The care management team will reach out to the patient again over the next 30 days.   Provider Signature Regina Eck, PharmD, BCPS Clinical Pharmacist, [facility] [place]: [phone]

## 2018-11-29 NOTE — Patient Instructions (Signed)
Visit Information  Goals Addressed            This Visit's Progress     Patient Stated   . I would like to apply for medicaid (pt-stated)       Current Barriers:  . Financial Barriers  Pharmacist Clinical Goal(s):  [name] Over the next 60 days, patient will work with CCM team and PCP to apply for Medicaid to address needs related to d  Interventions: . Comprehensive medication review performed. . Collaboration with provider re: medication management . Referred to Clinic Social Worker for [employer] application assistance . Number given to patient for [facility] DSS--patient states she called and application status is pending.  Patient states she is brining in less money per month since she is out of work.  Patient provided with samples until application status determined.  Discussed patient assistance with patient and filled out the necessary paperwork if PAP needed. . Case discussed with CCM BSW, Kendra Humble.  Appreciate assistance.  Patient Self Care Activities:  . Self administers medications as prescribed . Attends all scheduled provider appointments . Calls pharmacy for medication refills . Calls provider office for new concerns or questions  Please see past updates related to this goal by clicking on the "Past Updates" button in the selected goal      . I would like to control my diabetes and chronic conditions (pt-stated)       Current Barriers:  . Diabetes: T2DM; most recent A1c 10.6% on 10/02/18  . Current antihyperglycemic regimen: Tresiba 15 units qHS, Tradjenta o Samples provided for patient Tyler Aas and Tradjenta) o Will f/u Medicaid outcome o May apply for [employer] patient assistance if Medicaid not obtained . Denies hypoglycemic symptoms; denies hyperglycemic symptoms . Current meal patterns: will discuss . Current exercise: walking . Current blood glucose readings: FBG 112, 119, 124 . Cardiovascular risk reduction: o Current hypertensive regimen: telmisartan,  amlodipine o Current hyperlipidemia regimen: rosuvastatin (not taking due to cost-applying for medicaid)  Pharmacist Clinical Goal(s):  [name] Over the next 90 days, patient with work with PharmD and primary care provider to address needs related to optimization of medication management of chronic conditions (diabetes)  Interventions: . Comprehensive medication review performed, medication list updated in electronic medical record . Discussed the importance of diet & exercise . Application for medicaid completed-->will adjust diabetic medications once access issues are resolved  Patient Self Care Activities:  . Patient will check blood glucose daily, document, and provide at future appointments . Patient will focus on medication adherence by continuing to take medications as prescribed . Patient will take medications as prescribed . Patient will contact provider with any episodes of hypoglycemia . Patient will report any questions or concerns to provider   Please see past updates related to this goal by clicking on the "Past Updates" button in the selected goal         The patient verbalized understanding of instructions provided today and declined a print copy of patient instruction materials.   The care management team will reach out to the patient again over the next 30 days.   SIGNATURE Regina Eck, PharmD, BCPS Clinical Pharmacist, [facility] [place]: [phone]

## 2018-12-09 ENCOUNTER — Telehealth: Payer: Self-pay

## 2018-12-10 ENCOUNTER — Encounter: Payer: Self-pay | Admitting: Nurse Practitioner

## 2018-12-10 ENCOUNTER — Ambulatory Visit (INDEPENDENT_AMBULATORY_CARE_PROVIDER_SITE_OTHER): Payer: Medicare Other | Admitting: Nurse Practitioner

## 2018-12-10 ENCOUNTER — Other Ambulatory Visit: Payer: Self-pay

## 2018-12-10 VITALS — BP 112/80 | Temp 97.5°F | Ht 63.8 in | Wt 114.0 lb

## 2018-12-10 DIAGNOSIS — Z0000 Encounter for general adult medical examination without abnormal findings: Secondary | ICD-10-CM

## 2018-12-10 DIAGNOSIS — I10 Essential (primary) hypertension: Secondary | ICD-10-CM | POA: Diagnosis not present

## 2018-12-10 DIAGNOSIS — E1165 Type 2 diabetes mellitus with hyperglycemia: Secondary | ICD-10-CM | POA: Diagnosis not present

## 2018-12-10 DIAGNOSIS — Z1159 Encounter for screening for other viral diseases: Secondary | ICD-10-CM

## 2018-12-10 DIAGNOSIS — Z1231 Encounter for screening mammogram for malignant neoplasm of breast: Secondary | ICD-10-CM

## 2018-12-10 DIAGNOSIS — E2839 Other primary ovarian failure: Secondary | ICD-10-CM

## 2018-12-10 LAB — POCT URINALYSIS DIPSTICK
Bilirubin, UA: NEGATIVE
Glucose, UA: POSITIVE — AB
Ketones, UA: NEGATIVE
Leukocytes, UA: NEGATIVE
Nitrite, UA: NEGATIVE
Protein, UA: NEGATIVE
Spec Grav, UA: 1.02 (ref 1.010–1.025)
Urobilinogen, UA: 0.2 E.U./dL
pH, UA: 5.5 (ref 5.0–8.0)

## 2018-12-10 LAB — POCT UA - MICROALBUMIN
Albumin/Creatinine Ratio, Urine, POC: 30
Creatinine, POC: 200 mg/dL
Microalbumin Ur, POC: 30 mg/L

## 2018-12-10 NOTE — Progress Notes (Signed)
This visit occurred during the SARS-CoV-2 public health emergency.  Safety protocols were in place, including screening questions prior to the visit, additional usage of staff PPE, and extensive cleaning of exam room while observing appropriate contact time as indicated for disinfecting solutions.  Subjective:     Patient ID: Gabriella Acosta , female    DOB: April 24, 1945 , 73 y.o.   MRN: [id number]   Chief Complaint  Patient presents with  . Annual Exam    HPI  Here for HM  Wt Readings from Last 3 Encounters: 12/10/18 : 114 lb (51.7 kg) 11/07/18 : 112 lb (50.8 kg) 10/07/18 : 132 lb (59.9 kg)   Diabetes She presents for her follow-up diabetic visit. She has type 2 diabetes mellitus. Her disease course has been worsening. There are no diabetic associated symptoms. She is following a generally unhealthy diet. When asked about meal planning, she reported none. She has not had a previous visit with a dietitian. She never participates in exercise. (230's - 400's) An ACE inhibitor/angiotensin II receptor blocker is being taken. She does not see a podiatrist.Eye exam is not current (she has not had a diabetic eye exam).    The patient states she uses post menopausal statusfor birth control.  Mammogram has not been done.  Negative for: breast discharge, breast lump(s), breast pain and breast self exam.  Pertinent negatives include abnormal bleeding (hematology), anxiety, decreased libido, depression, difficulty falling sleep, dyspareunia, history of infertility, nocturia, sexual dysfunction, sleep disturbances, urinary incontinence, urinary urgency, vaginal discharge and vaginal itching. Diet regular, not really eating increased amounts of breads, pasta or potatoes. The patient states her exercise level is none.     The patient's tobacco use is:  Social History   Tobacco Use  Smoking Status Current Every Day Smoker  . Packs/day: 0.25  . Types: Cigarettes  Smokeless Tobacco Current User    She has been exposed to passive smoke. The patient's alcohol use is:  Social History   Substance and Sexual Activity  Alcohol Use Yes   Comment: very little      Past Medical History:  Diagnosis Date  . Hypertension      Family History  Problem Relation Age of Onset  . Diabetes Mother   . Diabetes Father   . Heart disease Father      Current Outpatient Medications:  .  amLODipine (NORVASC) 5 MG tablet, Take 1 tablet (5 mg total) by mouth daily., Disp: 90 tablet, Rfl: 0 .  Blood Glucose Monitoring Suppl DEVI, Check blood sugars twice daily. E11.9, Disp: 1 kit, Rfl: 0 .  glucose blood (ACCU-CHEK GUIDE) test strip, Use as instructed, Disp: 100 each, Rfl: 12 .  insulin degludec (TRESIBA FLEXTOUCH) 100 UNIT/ML SOPN FlexTouch Pen, Inject 0.15 mLs (15 Units total) into the skin daily., Disp: 15 pen, Rfl: 1 .  Insulin Pen Needle (PEN NEEDLES) 32G X 4 MM MISC, 1 Stick by Does not apply route daily., Disp: 30 each, Rfl: 2 .  Lancets (ACCU-CHEK SOFT TOUCH) lancets, Use as instructed, Disp: 100 each, Rfl: 12 .  linagliptin (TRADJENTA) 5 MG TABS tablet, Take 1 tablet (5 mg total) by mouth daily., Disp: 30 tablet, Rfl: 0 .  magnesium 30 MG tablet, Take 30 mg by mouth once a week., Disp: , Rfl:  .  potassium chloride (KLOR-CON 10) 10 MEQ tablet, Take 1 tablet (10 mEq total) by mouth daily., Disp: 90 tablet, Rfl: 1 .  telmisartan (MICARDIS) 20 MG tablet, TAKE 1 TABLET BY  MOUTH EVERY DAY, Disp: 90 tablet, Rfl: 1 .  diclofenac sodium (VOLTAREN) 1 % GEL, Apply 2 g topically 4 (four) times daily. (Patient not taking: Reported on 12/10/2018), Disp: 100 g, Rfl: 1 .  meloxicam (MOBIC) 15 MG tablet, TK 1 T PO QD AS NEEDED WITH FOOD, Disp: , Rfl:  .  rosuvastatin (CRESTOR) 10 MG tablet, Take 1 tablet (10 mg total) by mouth daily. (Patient not taking: Reported on 12/10/2018), Disp: 30 tablet, Rfl: 2   No Known Allergies   Review of Systems  Constitutional: Negative.   HENT: Negative.   Eyes:  Negative.   Respiratory: Negative.   Cardiovascular: Negative.   Gastrointestinal: Negative.   Endocrine: Negative.   Genitourinary: Negative.   Musculoskeletal: Negative.   Skin: Negative.   Allergic/Immunologic: Negative.   Neurological: Negative.   Hematological: Negative.   Psychiatric/Behavioral: Negative.      Today's Vitals   12/10/18 1414  BP: 112/80  Temp: (!) 97.5 F (36.4 C)  TempSrc: Oral  Weight: 114 lb (51.7 kg)  Height: 5' 3.8" (1.621 m)  PainSc: 0-No pain   Body mass index is 19.69 kg/m.   Objective:  Physical Exam Vitals signs reviewed.  Constitutional:      Appearance: Normal appearance. She is well-developed.  HENT:     Head: Normocephalic and atraumatic.     Right Ear: Hearing, tympanic membrane, ear canal and external ear normal.     Left Ear: Hearing, tympanic membrane, ear canal and external ear normal.  Eyes:     General: Lids are normal.     Extraocular Movements: Extraocular movements intact.     Conjunctiva/sclera: Conjunctivae normal.     Pupils: Pupils are equal, round, and reactive to light.     Funduscopic exam:    Right eye: No papilledema.        Left eye: No papilledema.  Neck:     Musculoskeletal: Full passive range of motion without pain, normal range of motion and neck supple.     Thyroid: No thyroid mass.     Vascular: No carotid bruit.  Cardiovascular:     Rate and Rhythm: Normal rate and regular rhythm.     Pulses: Normal pulses.     Heart sounds: Normal heart sounds. No murmur.  Pulmonary:     Effort: Pulmonary effort is normal. No respiratory distress.     Breath sounds: Normal breath sounds.  Chest:     Chest wall: No mass.     Breasts:        Right: Normal. No mass or tenderness.        Left: Normal. No mass or tenderness.  Abdominal:     General: Abdomen is flat. Bowel sounds are normal.     Palpations: Abdomen is soft.  Musculoskeletal: Normal range of motion.        General: No swelling.     Right lower  leg: No edema.     Left lower leg: No edema.  Lymphadenopathy:     Upper Body:     Right upper body: No supraclavicular adenopathy.     Left upper body: No supraclavicular adenopathy.  Skin:    General: Skin is warm and dry.     Capillary Refill: Capillary refill takes less than 2 seconds.  Neurological:     General: No focal deficit present.     Mental Status: She is alert and oriented to person, place, and time.     Cranial Nerves: No cranial nerve deficit.  Sensory: No sensory deficit.  Psychiatric:        Mood and Affect: Mood normal.        Behavior: Behavior normal.        Thought Content: Thought content normal.        Judgment: Judgment normal.         Assessment And Plan:     1. Health maintenance examination . Behavior modifications discussed and diet history reviewed.   . Pt will continue to exercise regularly and modify diet with low GI, plant based foods and decrease intake of processed foods.  . Recommend intake of daily multivitamin, Vitamin D, and calcium.  . Recommend mammogram for preventive screenings, as well as recommend immunizations that include influenza, TDAP  2. Screening mammogram, encounter for  Pt instructed on Self Breast Exam.According to ACOG guidelines Women aged 70 and older are recommended to get an annual mammogram. Form completed and given to patient contact the [facility] for appointment scheduing.   Pt encouraged to get annual mammogram - MM Digital Screening; Future  3. Essential hypertension . B/P is controlled.  . CMP ordered to check renal function.  . The importance of regular exercise and dietary modification was stressed to the patient.  . EKG done revealed NSR HR 72 - POCT Urinalysis Dipstick (81002) - POCT UA - Microalbumin - EKG 12-Lead  4. Encounter for hepatitis C screening test for low risk patient  Will check for Hepatitis C screening due to being born between the years 1945-1965 - Hepatitis C antibody -  DG Bone Density; Future  5. Decreased estrogen level  DEXA scan, DEXA scan ordered to evaluate the patient for osteoporosis. - DG Bone Density; Future  6. Uncontrolled type 2 diabetes mellitus with hyperglycemia ([place])  She has worsening diabetes and does admit to being in denial of her diabetes  She is tolerating [place] well, I will start her on Januvia as well she does not have kidney disease so this should be okay.   I have also had her to speak with Almyra Free Pharmacist from [facility] - Ambulatory referral to Ophthalmology   [name], FNP    THE PATIENT IS ENCOURAGED TO PRACTICE SOCIAL DISTANCING DUE TO THE COVID-19 PANDEMIC.

## 2018-12-10 NOTE — Patient Instructions (Signed)
Health Maintenance  Topic Date Due  . MAMMOGRAM  04/13/2007  . DEXA SCAN  01/13/2012  . INFLUENZA VACCINE  04/09/2019 (Originally 08/10/2018)  . COLONOSCOPY  07/23/2019 (Originally 01/12/1997)  . PNA vac Low Risk Adult (1 of 2 - PCV13) 07/23/2019 (Originally 01/13/2012)  . TETANUS/TDAP  07/30/2023  . Hepatitis C Screening  Completed   Health Maintenance After Age 71 After age 15, you are at a higher risk for certain long-term diseases and infections as well as injuries from falls. Falls are a major cause of broken bones and head injuries in people who are older than age 80. Getting regular preventive care can help to keep you healthy and well. Preventive care includes getting regular testing and making lifestyle changes as recommended by your health care provider. Talk with your health care provider about:  Which screenings and tests you should have. A screening is a test that checks for a disease when you have no symptoms.  A diet and exercise plan that is right for you. What should I know about screenings and tests to prevent falls? Screening and testing are the best ways to find a health problem early. Early diagnosis and treatment give you the best chance of managing medical conditions that are common after age 1. Certain conditions and lifestyle choices may make you more likely to have a fall. Your health care provider may recommend:  Regular vision checks. Poor vision and conditions such as cataracts can make you more likely to have a fall. If you wear glasses, make sure to get your prescription updated if your vision changes.  Medicine review. Work with your health care provider to regularly review all of the medicines you are taking, including over-the-counter medicines. Ask your health care provider about any side effects that may make you more likely to have a fall. Tell your health care provider if any medicines that you take make you feel dizzy or sleepy.  Osteoporosis screening.  Osteoporosis is a condition that causes the bones to get weaker. This can make the bones weak and cause them to break more easily.  Blood pressure screening. Blood pressure changes and medicines to control blood pressure can make you feel dizzy.  Strength and balance checks. Your health care provider may recommend certain tests to check your strength and balance while standing, walking, or changing positions.  Foot health exam. Foot pain and numbness, as well as not wearing proper footwear, can make you more likely to have a fall.  Depression screening. You may be more likely to have a fall if you have a fear of falling, feel emotionally low, or feel unable to do activities that you used to do.  Alcohol use screening. Using too much alcohol can affect your balance and may make you more likely to have a fall. What actions can I take to lower my risk of falls? General instructions  Talk with your health care provider about your risks for falling. Tell your health care provider if: ? You fall. Be sure to tell your health care provider about all falls, even ones that seem minor. ? You feel dizzy, sleepy, or off-balance.  Take over-the-counter and prescription medicines only as told by your health care provider. These include any supplements.  Eat a healthy diet and maintain a healthy weight. A healthy diet includes low-fat dairy products, low-fat (lean) meats, and fiber from whole grains, beans, and lots of fruits and vegetables. Home safety  Remove any tripping hazards, such as rugs, cords,  and clutter.  Install safety equipment such as grab bars in bathrooms and safety rails on stairs.  Keep rooms and walkways well-lit. Activity   Follow a regular exercise program to stay fit. This will help you maintain your balance. Ask your health care provider what types of exercise are appropriate for you.  If you need a cane or walker, use it as recommended by your health care provider.  Wear  supportive shoes that have nonskid soles. Lifestyle  Do not drink alcohol if your health care provider tells you not to drink.  If you drink alcohol, limit how much you have: ? 0-1 drink a day for women. ? 0-2 drinks a day for men.  Be aware of how much alcohol is in your drink. In the [place] one drink equals one typical bottle of beer (12 oz), one-half glass of wine (5 oz), or one shot of hard liquor (1 oz).  Do not use any products that contain nicotine or tobacco, such as cigarettes and e-cigarettes. If you need help quitting, ask your health care provider. Summary  Having a healthy lifestyle and getting preventive care can help to protect your health and wellness after age 8.  Screening and testing are the best way to find a health problem early and help you avoid having a fall. Early diagnosis and treatment give you the best chance for managing medical conditions that are more common for people who are older than age 41.  Falls are a major cause of broken bones and head injuries in people who are older than age 43. Take precautions to prevent a fall at home.  Work with your health care provider to learn what changes you can make to improve your health and wellness and to prevent falls. This information is not intended to replace advice given to you by your health care provider. Make sure you discuss any questions you have with your health care provider. Document Released: 11/08/2016 Document Revised: 04/18/2018 Document Reviewed: 11/08/2016 Elsevier Patient Education  2020 [employer].

## 2018-12-11 LAB — HEPATITIS C ANTIBODY: Hep C Virus Ab: <0.1 s/co ratio (ref 0.0–0.9)

## 2018-12-12 ENCOUNTER — Ambulatory Visit: Payer: Self-pay | Admitting: Pharmacist

## 2018-12-12 DIAGNOSIS — E1165 Type 2 diabetes mellitus with hyperglycemia: Secondary | ICD-10-CM

## 2018-12-12 NOTE — Patient Instructions (Addendum)
  Visit Information  Goals Addressed            This Visit's Progress     Patient Stated   . I would like to apply for medication assistance (pt-stated)       Current Barriers:  . Financial Barriers in complicated patient with multiple medical conditions including T2DM, HTN, HLD; patient has [employer] and reports copay for Tyler Aas & Celesta Gentile is cost prohibitive at this time   Pharmacist Clinical Goal(s):  [name] Over the next 30 days, patient will work with PharmD and providers to relieve medication access concerns  Interventions: . Comprehensive medication review completed; medication list updated in electronic medical record.  Celesta Gentile by [employer]: Patient meets income/NO out of pocket spend criteria for this medication's patient assistance program. Reviewed application process. Patient will provide proof of income, out of pocket spend report, and will sign application. Will collaborate with primary care provider, Dr [name], DNP, FNP, for their portion of application. Once completed, will submit to [employer] patient assistance program. . Will await Medicaid decision to apply for insulin program.   Patient Self Care Activities:  . Patient will provide necessary portions of application   Initial goal documentation     . I would like to control my diabetes and chronic conditions (pt-stated)       Current Barriers:  . Diabetes: T2DM; most recent A1c 10.6% on 10/02/18  . Current antihyperglycemic regimen: Tresiba 20 units qHS, Tradjenta-->Januvia o Samples provided for patient Tyler Aas and [place]) o Will f/u Medicaid outcome o May apply for [employer] patient assistance Environmental health practitioner) if Medicaid not obtained o Will apply for [employer]) assistance since able to obtain . Denies hypoglycemic symptoms; denies hyperglycemic symptoms . Current meal patterns: will discuss . Current exercise: walking . Current blood glucose readings: FBG 112, 119, 124 . Cardiovascular risk  reduction: o Current hypertensive regimen: telmisartan, amlodipine o Current hyperlipidemia regimen: rosuvastatin (not taking due to cost-applying for medicaid)  Pharmacist Clinical Goal(s):  [name] Over the next 90 days, patient with work with PharmD and primary care provider to address needs related to optimization of medication management of chronic conditions (diabetes)  Interventions: . Comprehensive medication review performed, medication list updated in electronic medical record . Discussed the importance of diet & exercise . Application for medicaid completed-->will adjust diabetic medications once access issues are resolved  Patient Self Care Activities:  . Patient will check blood glucose daily, document, and provide at future appointments . Patient will focus on medication adherence by continuing to take medications as prescribed . Patient will take medications as prescribed . Patient will contact provider with any episodes of hypoglycemia . Patient will report any questions or concerns to provider   Please see past updates related to this goal by clicking on the "Past Updates" button in the selected goal         The patient verbalized understanding of instructions provided today and declined a print copy of patient instruction materials.   The care management team will reach out to the patient again over the next 30 days.   SIGNATURE Regina Eck, PharmD, BCPS Clinical Pharmacist, [facility] [place]: [phone]

## 2018-12-16 NOTE — Progress Notes (Signed)
Chronic Care Management   Visit Note  12/13/2018 Name: Yamile Roedl MRN: [id number] DOB: 05-Mar-1945  Referred by: [name], FNP Reason for referral : Chronic Care Management   Eriyah Fernando is a 73 y.o. year old female who is a primary care patient of [name], [place]. The CCM team was consulted for assistance with chronic disease management and care coordination needs related to DMII  Review of patient status, including review of consultants reports, relevant laboratory and other test results, and collaboration with appropriate care team members and the patient's provider was performed as part of comprehensive patient evaluation and provision of chronic care management services.    I spoke with [name] by telephone today.  Medications: Outpatient Encounter Medications as of 12/12/2018  Medication Sig  . amLODipine (NORVASC) 5 MG tablet Take 1 tablet (5 mg total) by mouth daily.  . Blood Glucose Monitoring Suppl DEVI Check blood sugars twice daily. E11.9  . diclofenac sodium (VOLTAREN) 1 % GEL Apply 2 g topically 4 (four) times daily. (Patient not taking: Reported on 12/10/2018)  . glucose blood (ACCU-CHEK GUIDE) test strip Use as instructed  . insulin degludec (TRESIBA FLEXTOUCH) 100 UNIT/ML SOPN FlexTouch Pen Inject 0.15 mLs (15 Units total) into the skin daily. (Patient taking differently: Inject 20 Units into the skin daily. )  . Insulin Pen Needle (PEN NEEDLES) 32G X 4 MM MISC 1 Stick by Does not apply route daily.  . Lancets (ACCU-CHEK SOFT TOUCH) lancets Use as instructed  . linagliptin (TRADJENTA) 5 MG TABS tablet Take 1 tablet (5 mg total) by mouth daily.  . magnesium 30 MG tablet Take 30 mg by mouth once a week.  . meloxicam (MOBIC) 15 MG tablet TK 1 T PO QD AS NEEDED WITH FOOD  . potassium chloride (KLOR-CON 10) 10 MEQ tablet Take 1 tablet (10 mEq total) by mouth daily.  . rosuvastatin (CRESTOR) 10 MG tablet Take 1 tablet (10 mg total) by mouth daily.  (Patient not taking: Reported on 12/10/2018)  . telmisartan (MICARDIS) 20 MG tablet TAKE 1 TABLET BY MOUTH EVERY DAY   No facility-administered encounter medications on file as of 12/12/2018.      Objective:   Goals Addressed            This Visit's Progress     Patient Stated   . I would like to apply for medication assistance (pt-stated)       Current Barriers:  . Financial Barriers in complicated patient with multiple medical conditions including T2DM, HTN, HLD; patient has [employer] and reports copay for Tyler Aas & Celesta Gentile is cost prohibitive at this time   Pharmacist Clinical Goal(s):  [name] Over the next 30 days, patient will work with PharmD and providers to relieve medication access concerns  Interventions: . Comprehensive medication review completed; medication list updated in electronic medical record.  Celesta Gentile by Merck: Patient meets income/NO out of pocket spend criteria for this medication's patient assistance program. Reviewed application process. Patient will provide proof of income, out of pocket spend report, and will sign application. Will collaborate with primary care provider, Dr [name], DNP, FNP, for their portion of application. Once completed, will submit to Merck patient assistance program. . Will await Medicaid decision to apply for insulin program.   Patient Self Care Activities:  . Patient will provide necessary portions of application   Initial goal documentation     . I would like to control my diabetes and chronic conditions (pt-stated)  Current Barriers:  . Diabetes: T2DM; most recent A1c 10.6% on 10/02/18  . Current antihyperglycemic regimen: Tresiba 20 units qHS, Tradjenta-->Januvia o Samples provided for patient Evaristo Bury and [place]) o Will f/u Medicaid outcome o May apply for [employer] patient assistance Psychologist, sport and exercise) if Medicaid not obtained o Will apply for Merck [employer]) assistance since able to obtain . Denies hypoglycemic  symptoms; denies hyperglycemic symptoms . Current meal patterns: will discuss . Current exercise: walking . Current blood glucose readings: FBG 112, 119, 124 . Cardiovascular risk reduction: o Current hypertensive regimen: telmisartan, amlodipine o Current hyperlipidemia regimen: rosuvastatin (not taking due to cost-applying for medicaid)  Pharmacist Clinical Goal(s):  [name] Over the next 90 days, patient with work with PharmD and primary care provider to address needs related to optimization of medication management of chronic conditions (diabetes)  Interventions: . Comprehensive medication review performed, medication list updated in electronic medical record . Discussed the importance of diet & exercise . Application for medicaid completed-->will adjust diabetic medications once access issues are resolved  Patient Self Care Activities:  . Patient will check blood glucose daily, document, and provide at future appointments . Patient will focus on medication adherence by continuing to take medications as prescribed . Patient will take medications as prescribed . Patient will contact provider with any episodes of hypoglycemia . Patient will report any questions or concerns to provider   Please see past updates related to this goal by clicking on the "Past Updates" button in the selected goal          Plan:   The care management team will reach out to the patient again over the next 30 days.   Provider Signature Kieth Brightly, PharmD, BCPS Clinical Pharmacist, [facility]  [facility]  Direct Dial: [phone]

## 2018-12-18 ENCOUNTER — Telehealth: Payer: Self-pay

## 2018-12-18 MED ORDER — SITAGLIPTIN PHOSPHATE 25 MG PO TABS: 25.0000 mg | ORAL_TABLET | Freq: Every day | ORAL | 1 refills | Status: DC

## 2018-12-19 ENCOUNTER — Other Ambulatory Visit: Payer: Self-pay | Admitting: Pharmacy Technician

## 2018-12-19 NOTE — Patient Outreach (Signed)
[place] [facility]) Care Management  12/19/2018  Johnetta Sloniker 1945-05-20 [id number]                                       Medication Assistance Referral  Referral From: [facility] Embedded RPh Jenne Pane   Medication/Company: Celesta Gentile / Merck Patient application portion:  Education officer, museum portion: [employer] to [employer]. Laurance Flatten, [place] Provider address/fax verified via: Office website   Follow up:  Will follow up with patient in 10-14 business days to confirm application(s) have been received.  Maud Deed Chana Bode [place] Certified Pharmacy Technician [place] Management Direct Dial:[phone]

## 2018-12-29 ENCOUNTER — Other Ambulatory Visit: Payer: Self-pay | Admitting: Nurse Practitioner

## 2018-12-29 MED ORDER — AMLODIPINE BESYLATE 5 MG PO TABS: 5.0000 mg | ORAL_TABLET | Freq: Every day | ORAL | 0 refills | Status: DC

## 2019-01-01 ENCOUNTER — Other Ambulatory Visit: Payer: Self-pay | Admitting: Pharmacy Technician

## 2019-01-01 NOTE — Patient Outreach (Signed)
[place] [facility]) Care Management  01/01/2019  Gabriella Acosta 07/17/1945 [id number]   Received patient portion(s) of patient assistance application(s) for Januvia. Prepared to mail completed application and required documents into Merck.  Will follow up with company(ies) in 10-14 business days to check status of application(s).  Maud Deed Chana Bode [place] Certified Pharmacy Technician [place] Management Direct [phone]

## 2019-01-02 LAB — COMPREHENSIVE METABOLIC PANEL
ALT: 12 IU/L (ref 0–32)
AST: 14 IU/L (ref 0–40)
Albumin/Globulin Ratio: 1.5 (ref 1.2–2.2)
Albumin: 4.4 g/dL (ref 3.7–4.7)
Alkaline Phosphatase: 213 IU/L — ABNORMAL HIGH (ref 39–117)
BUN/Creatinine Ratio: 17 (ref 12–28)
BUN: 11 mg/dL (ref 8–27)
Bilirubin Total: <0.2 mg/dL (ref 0.0–1.2)
CO2: 23 mmol/L (ref 20–29)
Calcium: 10.1 mg/dL (ref 8.7–10.3)
Chloride: 92 mmol/L — ABNORMAL LOW (ref 96–106)
Creatinine, Ser: 0.66 mg/dL (ref 0.57–1.00)
GFR calc Af Amer: 103 mL/min/{1.73_m2} (ref >59)
GFR calc non Af Amer: 89 mL/min/{1.73_m2} (ref >59)
Globulin, Total: 3 g/dL (ref 1.5–4.5)
Glucose: 294 mg/dL — ABNORMAL HIGH (ref 65–99)
Potassium: 4.2 mmol/L (ref 3.5–5.2)
Sodium: 134 mmol/L (ref 134–144)
Total Protein: 7.4 g/dL (ref 6.0–8.5)

## 2019-01-02 LAB — SPECIMEN STATUS REPORT

## 2019-01-06 ENCOUNTER — Ambulatory Visit (INDEPENDENT_AMBULATORY_CARE_PROVIDER_SITE_OTHER): Payer: Medicare Other | Admitting: Pharmacist

## 2019-01-06 DIAGNOSIS — I10 Essential (primary) hypertension: Secondary | ICD-10-CM | POA: Diagnosis not present

## 2019-01-06 DIAGNOSIS — E782 Mixed hyperlipidemia: Secondary | ICD-10-CM | POA: Diagnosis not present

## 2019-01-06 DIAGNOSIS — E1165 Type 2 diabetes mellitus with hyperglycemia: Secondary | ICD-10-CM | POA: Diagnosis not present

## 2019-01-07 NOTE — Progress Notes (Signed)
Chronic Care Management   Visit Note  01/06/2019 Name: Gabriella Acosta MRN: [id number] DOB: 05-03-1945  Referred by: [name], FNP Reason for referral : Chronic Care Management   Gabriella Acosta is a 73 y.o. year old female who is a primary care patient of [name], [place]. The CCM team was consulted for assistance with chronic disease management and care coordination needs related to DMII  Review of patient status, including review of consultants reports, relevant laboratory and other test results, and collaboration with appropriate care team members and the patient's provider was performed as part of comprehensive patient evaluation and provision of chronic care management services.    I spoke with Gabriella Acosta by telephone today.  Medications: Outpatient Encounter Medications as of 01/06/2019  Medication Sig  . amLODipine (NORVASC) 5 MG tablet Take 1 tablet (5 mg total) by mouth daily.  . Blood Glucose Monitoring Suppl DEVI Check blood sugars twice daily. E11.9  . diclofenac sodium (VOLTAREN) 1 % GEL Apply 2 g topically 4 (four) times daily. (Patient not taking: Reported on 12/10/2018)  . glucose blood (ACCU-CHEK GUIDE) test strip Use as instructed  . insulin degludec (TRESIBA FLEXTOUCH) 100 UNIT/ML SOPN FlexTouch Pen Inject 0.15 mLs (15 Units total) into the skin daily. (Patient taking differently: Inject 20 Units into the skin daily. )  . Insulin Pen Needle (PEN NEEDLES) 32G X 4 MM MISC 1 Stick by Does not apply route daily.  . Lancets (ACCU-CHEK SOFT TOUCH) lancets Use as instructed  . magnesium 30 MG tablet Take 30 mg by mouth once a week.  . meloxicam (MOBIC) 15 MG tablet TK 1 T PO QD AS NEEDED WITH FOOD  . potassium chloride (KLOR-CON 10) 10 MEQ tablet Take 1 tablet (10 mEq total) by mouth daily.  . rosuvastatin (CRESTOR) 10 MG tablet Take 1 tablet (10 mg total) by mouth daily. (Patient not taking: Reported on 12/10/2018)  . sitaGLIPtin (JANUVIA) 25 MG tablet Take 1  tablet (25 mg total) by mouth daily.  [name] telmisartan (MICARDIS) 20 MG tablet TAKE 1 TABLET BY MOUTH EVERY DAY  . [DISCONTINUED] linagliptin (TRADJENTA) 5 MG TABS tablet Take 1 tablet (5 mg total) by mouth daily.   No facility-administered encounter medications on file as of 01/06/2019.     Objective:   Goals Addressed            This Visit's Progress     Patient Stated   . I would like to control my diabetes and chronic conditions (pt-stated)       Current Barriers:  . Diabetes: T2DM; most recent A1c 10.6% on 10/02/18  . Current antihyperglycemic regimen: Tresiba 20 units qHS, Tradjenta-->Januvia 50mg  o Samples provided for patient Gabriella Acosta and Januvia 50mg ) on 01/06/19 o Will f/u Medicaid outcome-pt denied-awaiting letter to apply for financial assistance through [employer] May apply for [employer] patient assistance Environmental health practitioner) if [employer] letter not obtained o Will apply for Merck [employer]) assistance since able to obtain (mailed) . Denies hypoglycemic symptoms; denies hyperglycemic symptoms . Current meal patterns: will discuss . Current exercise: walking . Current blood glucose readings: FBG 116, 125, 130 . Cardiovascular risk reduction: o Current hypertensive regimen: telmisartan, amlodipine o Current hyperlipidemia regimen: rosuvastatin (not taking due to cost-applying for medicaid)  Pharmacist Clinical Goal(s):  [name] Over the next 90 days, patient with work with PharmD and primary care provider to address needs related to optimization of medication management of chronic conditions (diabetes)  Interventions: . Comprehensive medication review performed, medication list  updated in electronic medical record . Discussed the importance of diet & exercise . Application for medicaid completed-->patient denied, will move forward with PAP once letter received from patient   Patient Self Care Activities:  . Patient will check blood glucose daily, document, and provide at future  appointments . Patient will focus on medication adherence by continuing to take medications as prescribed . Patient will take medications as prescribed . Patient will contact provider with any episodes of hypoglycemia . Patient will report any questions or concerns to provider   Please see past updates related to this goal by clicking on the "Past Updates" button in the selected goal         Plan:   The care management team will reach out to the patient again over the next 30 days.   Provider Signature Kieth Brightly, PharmD, BCPS Clinical Pharmacist, [facility]  [facility]  Direct Dial: [phone]

## 2019-01-13 ENCOUNTER — Ambulatory Visit: Payer: Self-pay | Admitting: Pharmacist

## 2019-01-13 DIAGNOSIS — E1165 Type 2 diabetes mellitus with hyperglycemia: Secondary | ICD-10-CM

## 2019-01-15 ENCOUNTER — Ambulatory Visit: Payer: Self-pay | Admitting: Pharmacist

## 2019-01-15 ENCOUNTER — Other Ambulatory Visit: Payer: Self-pay

## 2019-01-15 ENCOUNTER — Other Ambulatory Visit: Payer: Medicare PPO

## 2019-01-15 DIAGNOSIS — E1165 Type 2 diabetes mellitus with hyperglycemia: Secondary | ICD-10-CM | POA: Diagnosis not present

## 2019-01-16 ENCOUNTER — Other Ambulatory Visit: Payer: Self-pay | Admitting: Pharmacy Technician

## 2019-01-16 LAB — HEMOGLOBIN A1C
Est. average glucose Bld gHb Est-mCnc: 346 mg/dL
Hgb A1c MFr Bld: 13.7 % — ABNORMAL HIGH (ref 4.8–5.6)

## 2019-01-16 NOTE — Progress Notes (Signed)
  Chronic Care Management   Outreach Note  01/13/2019 Name: Gabriella Acosta MRN: [id number] DOB: 12/27/1945  Referred by: [name], FNP Reason for referral : Chronic Care Management (diabetes)   Follow Up Plan: Face to Face appointment with care management team member scheduled for: 01/15/2019  SIGNATURE Kieth Brightly, PharmD, BCPS Clinical Pharmacist, [facility]  [facility]  Direct Dial: [phone]

## 2019-01-16 NOTE — Patient Outreach (Signed)
[facility]) Care Management  01/16/2019  Cassidey Barrales 08-Nov-1945 [id number]   Received patient and provider portion(s) of patient assistance application(s) for Basaglar. Faxed completed application and required documents into [employer].  Will follow up with company(ies) in 7-10 business days to check status of application(s).  Suzan Slick Effie Shy CPhT Certified Pharmacy Technician [facility] Care Management Direct Dial:[phone]

## 2019-01-16 NOTE — Progress Notes (Signed)
Chronic Care Management   Visit Note  01/15/2019 Name: Gabriella Acosta MRN: [id number] DOB: 01-24-1945  Referred by: Gabriella Brine, FNP Reason for referral : Chronic Care Management (Diabetes)   Gabriella Acosta is a 74 y.o. year old female who is a primary care patient of Gabriella Acosta, [place]. The CCM team was consulted for assistance with chronic disease management and care coordination needs related to DMII  Review of patient status, including review of consultants reports, relevant laboratory and other test results, and collaboration with appropriate care team members and the patient's provider was performed as part of comprehensive patient evaluation and provision of chronic care management services.    I met with Gabriella Acosta in clinic today.  Medications: Outpatient Encounter Medications as of 01/15/2019  Medication Sig  . amLODipine (NORVASC) 5 MG tablet Take 1 tablet (5 mg total) by mouth daily.  . Blood Glucose Monitoring Suppl DEVI Check blood sugars twice daily. E11.9  . diclofenac sodium (VOLTAREN) 1 % GEL Apply 2 g topically 4 (four) times daily. (Patient not taking: Reported on 12/10/2018)  . glucose blood (ACCU-CHEK GUIDE) test strip Use as instructed  . insulin degludec (TRESIBA FLEXTOUCH) 100 UNIT/ML SOPN FlexTouch Pen Inject 0.15 mLs (15 Units total) into the skin daily. (Patient taking differently: Inject 20 Units into the skin daily. )  . Insulin Pen Needle (PEN NEEDLES) 32G X 4 MM MISC 1 Stick by Does not apply route daily.  . Lancets (ACCU-CHEK SOFT TOUCH) lancets Use as instructed  . magnesium 30 MG tablet Take 30 mg by mouth once a week.  . meloxicam (MOBIC) 15 MG tablet TK 1 T PO QD AS NEEDED WITH FOOD  . potassium chloride (KLOR-CON 10) 10 MEQ tablet Take 1 tablet (10 mEq total) by mouth daily.  . rosuvastatin (CRESTOR) 10 MG tablet Take 1 tablet (10 mg total) by mouth daily. (Patient not taking: Reported on 12/10/2018)  . sitaGLIPtin (JANUVIA) 25 MG tablet Take 1  tablet (25 mg total) by mouth daily.  [name] telmisartan (MICARDIS) 20 MG tablet TAKE 1 TABLET BY MOUTH EVERY DAY   No facility-administered encounter medications on file as of 01/15/2019.     Objective:   Goals Addressed            This Visit's Progress     Patient Stated   . I would like to apply for medication assistance (pt-stated)       Current Barriers:  . Financial Barriers in complicated patient with multiple medical conditions including T2DM, HTN, HLD; patient has [employer] and reports copay for Gabriella Acosta & Gabriella Acosta is cost prohibitive at this time   Pharmacist Clinical Goal(s):  [name] Over the next 30 days, patient will work with PharmD and providers to relieve medication access concerns  Interventions: . Comprehensive medication review completed; medication list updated in electronic medical record.  Gabriella Acosta by Merck: Patient meets income/NO out of pocket spend criteria for this medication's patient assistance program. Reviewed application process. Patient will provide proof of income, out of pocket spend report, and will sign application. Will collaborate with primary care provider, Dr Gabriella Brine, DNP, FNP, for their portion of application. Once completed, will submit to Merck patient assistance program. . Application submitted for [employer] Environmental health practitioner) and Merck Gabriella Acosta)  Patient Self Care Activities:  . Patient will provide necessary portions of application   Initial goal documentation     . I would like to control my diabetes and chronic conditions (pt-stated)  Current Barriers:  . Diabetes: T2DM; most recent A1c 13.7% on 01/15/2019 was 10.6% on 10/02/18  . Current antihyperglycemic regimen: Tresiba 22 units qHS, Tradjenta-->Januvia 136m o Samples provided for patient (Gabriella Aasand Januvia 1031m on 01/15/19 o Lilly patient assistance (BEnvironmental health practitionersubmitted o Will apply for Merck (JToys 'R' Usassistance since able to obtain (mailed) . Denies hypoglycemic  symptoms; denies hyperglycemic symptoms . Current meal patterns: will discuss . Current exercise: walking . Current blood glucose readings: FBG 150, 165, 180 (still fluctuating)--Increased insulin to 22 units . Cardiovascular risk reduction: o Current hypertensive regimen: telmisartan, amlodipine o Current hyperlipidemia regimen: rosuvastatin (not taking due to cost-applying for medicaid)  Pharmacist Clinical Goal(s):  . Marland Kitchenver the next 90 days, patient with work with PharmD and primary care provider to address needs related to optimization of medication management of chronic conditions (diabetes)  Interventions: . Comprehensive medication review performed, medication list updated in electronic medical record . Discussed the importance of diet & exercise . Application for medicaid completed-->patient denied, will move forward with PAP once letter received from patient   Patient Self Care Activities:  . Patient will check blood glucose daily, document, and provide at future appointments . Patient will focus on medication adherence by continuing to take medications as prescribed . Patient will take medications as prescribed . Patient will contact provider with any episodes of hypoglycemia . Patient will report any questions or concerns to provider   Please see past updates related to this goal by clicking on the "Past Updates" button in the selected goal          Plan:   The care management team will reach out to the patient again over the next 30 days.   Provider Signature JuRegina EckPharmD, BCPS Clinical Pharmacist, [facility] [phone]

## 2019-01-16 NOTE — Patient Instructions (Addendum)
Visit Information  Goals Addressed            This Visit's Progress     Patient Stated   . I would like to apply for medication assistance (pt-stated)       Current Barriers:  . Financial Barriers in complicated patient with multiple medical conditions including T2DM, HTN, HLD; patient has [employer] and reports copay for Evaristo Bury & Alma Friendly is cost prohibitive at this time   Pharmacist Clinical Goal(s):  [name] Over the next 30 days, patient will work with PharmD and providers to relieve medication access concerns  Interventions: . Comprehensive medication review completed; medication list updated in electronic medical record.  Alma Friendly by Merck: Patient meets income/NO out of pocket spend criteria for this medication's patient assistance program. Reviewed application process. Patient will provide proof of income, out of pocket spend report, and will sign application. Will collaborate with primary care provider, Dr [name], DNP, FNP, for their portion of application. Once completed, will submit to Merck patient assistance program. . Application submitted for [employer] Psychologist, sport and exercise) and Merck Alma Friendly)  Patient Self Care Activities:  . Patient will provide necessary portions of application   Initial goal documentation     . I would like to control my diabetes and chronic conditions (pt-stated)       Current Barriers:  . Diabetes: T2DM; most recent A1c 13.7% on 01/15/2019 was 10.6% on 10/02/18  . Current antihyperglycemic regimen: Tresiba 22 units qHS, Tradjenta-->Januvia 100mg  o Samples provided for patient and Januvia 100mg ) on 01/15/19 o Lilly patient assistance ) submitted o Will apply for Merck 03/15/19) assistance since able to obtain (mailed) . Denies hypoglycemic symptoms; denies hyperglycemic symptoms . Current meal patterns: will discuss . Current exercise: walking . Current blood glucose readings: FBG 150, 165, 180 (still fluctuating)--Increased  insulin to 22 units . Cardiovascular risk reduction: o Current hypertensive regimen: telmisartan, amlodipine o Current hyperlipidemia regimen: rosuvastatin (not taking due to cost-applying for medicaid)  Pharmacist Clinical Goal(s):  Psychologist, sport and exercise Over the next 90 days, patient with work with PharmD and primary care provider to address needs related to optimization of medication management of chronic conditions (diabetes)  Interventions: . Comprehensive medication review performed, medication list updated in electronic medical record . Discussed the importance of diet & exercise . Application for medicaid completed-->patient denied, will move forward with PAP once letter received from patient   Patient Self Care Activities:  . Patient will check blood glucose daily, document, and provide at future appointments . Patient will focus on medication adherence by continuing to take medications as prescribed . Patient will take medications as prescribed . Patient will contact provider with any episodes of hypoglycemia . Patient will report any questions or concerns to provider   Please see past updates related to this goal by clicking on the "Past Updates" button in the selected goal         The patient verbalized understanding of instructions provided today and declined a print copy of patient instruction materials.   The care management team will reach out to the patient again over the next 30 days.   SIGNATURE [employer], PharmD, BCPS Clinical Pharmacist, [facility]  [facility]  Direct Dial: [phone]

## 2019-01-17 ENCOUNTER — Telehealth: Payer: Self-pay

## 2019-02-04 ENCOUNTER — Other Ambulatory Visit: Payer: Self-pay

## 2019-02-04 ENCOUNTER — Ambulatory Visit: Payer: Medicare PPO | Admitting: Nurse Practitioner

## 2019-02-04 ENCOUNTER — Encounter: Payer: Self-pay | Admitting: Nurse Practitioner

## 2019-02-04 VITALS — BP 122/76 | HR 82 | Temp 98.3°F | Wt 125.4 lb

## 2019-02-04 DIAGNOSIS — N898 Other specified noninflammatory disorders of vagina: Secondary | ICD-10-CM | POA: Diagnosis not present

## 2019-02-04 DIAGNOSIS — E1165 Type 2 diabetes mellitus with hyperglycemia: Secondary | ICD-10-CM

## 2019-02-04 DIAGNOSIS — E782 Mixed hyperlipidemia: Secondary | ICD-10-CM

## 2019-02-04 DIAGNOSIS — I10 Essential (primary) hypertension: Secondary | ICD-10-CM

## 2019-02-04 MED ORDER — FLUCONAZOLE 100 MG PO TABS: ORAL_TABLET | ORAL | 0 refills | Status: DC

## 2019-02-04 MED ORDER — TRESIBA FLEXTOUCH 100 UNIT/ML ~~LOC~~ SOPN: 20.0000 [IU] | PEN_INJECTOR | Freq: Every day | SUBCUTANEOUS | 1 refills | Status: DC

## 2019-02-04 MED ORDER — ATORVASTATIN CALCIUM 10 MG PO TABS: 10.0000 mg | ORAL_TABLET | Freq: Every day | ORAL | 2 refills | Status: DC

## 2019-02-04 NOTE — Progress Notes (Signed)
This visit occurred during the SARS-CoV-2 public health emergency.  Safety protocols were in place, including screening questions prior to the visit, additional usage of staff PPE, and extensive cleaning of exam room while observing appropriate contact time as indicated for disinfecting solutions.  Subjective:     Patient ID: Gabriella Acosta , female    DOB: 12/09/1945 , 74 y.o.   MRN: [id number]   Chief Complaint  Patient presents with  . Hypertension  . Vaginal Itching    patient stated she thinks she has a yeast infection for the past couple of months.  . Diabetes    HPI  Here for HM  Wt Readings from Last 3 Encounters: 12/10/18 : 114 lb (51.7 kg) 11/07/18 : 112 lb (50.8 kg) 10/07/18 : 132 lb (59.9 kg)   She is exercising once in the morning and once in the evening.   Hypertension This is a chronic problem. The current episode started more than 1 year ago. The problem is unchanged. The problem is controlled. Associated symptoms include sweats (at night). Pertinent negatives include no anxiety. There are no associated agents to hypertension. Risk factors for coronary artery disease include diabetes mellitus, obesity and sedentary lifestyle. Past treatments include ACE inhibitors. There are no compliance problems.  There is no history of angina. There is no history of chronic renal disease.  Vaginal Itching The patient's primary symptoms include genital itching. This is a new problem. The current episode started 1 to 4 weeks ago. The problem has been unchanged. The patient is experiencing no pain. She is not pregnant. Pertinent negatives include no abdominal pain, diarrhea, dysuria, frequency or nausea. Nothing aggravates the symptoms. She has tried antifungals for the symptoms. The treatment provided no relief. No, her partner does not have an STD. She uses nothing for contraception. She is postmenopausal. (Diabetes and taking Januvia)  Diabetes She presents for her follow-up  diabetic visit. She has type 2 diabetes mellitus. Her disease course has been worsening. Hypoglycemia symptoms include sweats (at night). There are no diabetic associated symptoms. Pertinent negatives for diabetes include no polydipsia, no polyphagia and no polyuria. There are no hypoglycemic complications. Symptoms are stable. There are no diabetic complications. Risk factors for coronary artery disease include sedentary lifestyle, hypertension, diabetes mellitus and dyslipidemia. Current diabetic treatment includes oral agent (dual therapy) (tresiba 22 units daily, januvia as well). She is compliant with treatment most of the time. She is following a generally unhealthy diet. When asked about meal planning, she reported none. She has not had a previous visit with a dietitian. She never participates in exercise. (Low 200's) An ACE inhibitor/angiotensin II receptor blocker is being taken. She does not see a podiatrist.Eye exam is not current (she has not had a diabetic eye exam).     Past Medical History:  Diagnosis Date  . Hypertension      Family History  Problem Relation Age of Onset  . Diabetes Mother   . Diabetes Father   . Heart disease Father      Current Outpatient Medications:  .  amLODipine (NORVASC) 5 MG tablet, Take 1 tablet (5 mg total) by mouth daily., Disp: 90 tablet, Rfl: 0 .  Blood Glucose Monitoring Suppl DEVI, Check blood sugars twice daily. E11.9, Disp: 1 kit, Rfl: 0 .  glucose blood (ACCU-CHEK GUIDE) test strip, Use as instructed, Disp: 100 each, Rfl: 12 .  insulin degludec (TRESIBA FLEXTOUCH) 100 UNIT/ML SOPN FlexTouch Pen, Inject 0.15 mLs (15 Units total) into the skin daily. (  Patient taking differently: Inject 20 Units into the skin daily. ), Disp: 15 pen, Rfl: 1 .  Insulin Pen Needle (PEN NEEDLES) 32G X 4 MM MISC, 1 Stick by Does not apply route daily., Disp: 30 each, Rfl: 2 .  Lancets (ACCU-CHEK SOFT TOUCH) lancets, Use as instructed, Disp: 100 each, Rfl: 12 .   magnesium 30 MG tablet, Take 30 mg by mouth once a week., Disp: , Rfl:  .  meloxicam (MOBIC) 15 MG tablet, TK 1 T PO QD AS NEEDED WITH FOOD, Disp: , Rfl:  .  potassium chloride (KLOR-CON 10) 10 MEQ tablet, Take 1 tablet (10 mEq total) by mouth daily., Disp: 90 tablet, Rfl: 1 .  sitaGLIPtin (JANUVIA) 25 MG tablet, Take 1 tablet (25 mg total) by mouth daily., Disp: 90 tablet, Rfl: 1 .  telmisartan (MICARDIS) 20 MG tablet, TAKE 1 TABLET BY MOUTH EVERY DAY, Disp: 90 tablet, Rfl: 1 .  diclofenac sodium (VOLTAREN) 1 % GEL, Apply 2 g topically 4 (four) times daily. (Patient not taking: Reported on 12/10/2018), Disp: 100 g, Rfl: 1   No Known Allergies   Review of Systems  Constitutional: Negative.   Respiratory: Negative.   Cardiovascular: Negative.   Gastrointestinal: Negative for abdominal pain, diarrhea and nausea.  Endocrine: Negative for polydipsia, polyphagia and polyuria.  Genitourinary: Negative for dysuria, frequency and vaginal pain.       Vaginal itching  Psychiatric/Behavioral: Negative.  Negative for agitation.     Today's Vitals   02/04/19 1414  BP: 122/76  Pulse: 82  Temp: 98.3 F (36.8 C)  TempSrc: Oral  Weight: 125 lb 6.4 oz (56.9 kg)  PainSc: 0-No pain   Body mass index is 21.66 kg/m.   Objective:  Physical Exam Constitutional:      Appearance: Normal appearance.  Cardiovascular:     Rate and Rhythm: Normal rate and regular rhythm.     Pulses: Normal pulses.     Heart sounds: Normal heart sounds. No murmur.  Pulmonary:     Effort: Pulmonary effort is normal. No respiratory distress.     Breath sounds: Normal breath sounds.  Neurological:     General: No focal deficit present.     Mental Status: She is alert and oriented to person, place, and time.  Psychiatric:        Mood and Affect: Mood normal.        Behavior: Behavior normal.        Thought Content: Thought content normal.        Judgment: Judgment normal.         Assessment And Plan:      1.  Uncontrolled type 2 diabetes mellitus with hyperglycemia (HCC)  Chronic, she is tolerating tresiba well  She continues with Januvia and is now having vaginal itching likely related to her blood sugar or the medication as she excretes sugar  Will check HgbA1c - CMP14+EGFR - Hemoglobin A1c - fluconazole (DIFLUCAN) 100 MG tablet; Take 1 tablet by mouth today then repeat in 5 days.  Dispense: 2 tablet; Refill: 0 - insulin degludec (TRESIBA FLEXTOUCH) 100 UNIT/ML SOPN FlexTouch Pen; Inject 0.2 mLs (20 Units total) into the skin daily.  Dispense: 15 pen; Refill: 1  2. Essential hypertension  Chronic, well controlled  Continue with current medications - CMP14+EGFR  3. Mixed hyperlipidemia  Chronic, controlled  Continue with current medications - CMP14+EGFR - Lipid panel - atorvastatin (LIPITOR) 10 MG tablet; Take 1 tablet (10 mg total) by mouth daily.  Dispense:  30 tablet; Refill: 2  4. Vaginal itching  Likely related to Januvia will treat with diflucan if not better will consider changing Januvia  [name], FNP    THE PATIENT IS ENCOURAGED TO PRACTICE SOCIAL DISTANCING DUE TO THE COVID-19 PANDEMIC.

## 2019-02-05 LAB — CMP14+EGFR
ALT: 11 IU/L (ref 0–32)
AST: 14 IU/L (ref 0–40)
Albumin/Globulin Ratio: 1.5 (ref 1.2–2.2)
Albumin: 4.4 g/dL (ref 3.7–4.7)
Alkaline Phosphatase: 171 IU/L — ABNORMAL HIGH (ref 39–117)
BUN/Creatinine Ratio: 14 (ref 12–28)
BUN: 10 mg/dL (ref 8–27)
Bilirubin Total: <0.2 mg/dL (ref 0.0–1.2)
CO2: 25 mmol/L (ref 20–29)
Calcium: 10.4 mg/dL — ABNORMAL HIGH (ref 8.7–10.3)
Chloride: 96 mmol/L (ref 96–106)
Creatinine, Ser: 0.7 mg/dL (ref 0.57–1.00)
GFR calc Af Amer: 100 mL/min/{1.73_m2} (ref >59)
GFR calc non Af Amer: 87 mL/min/{1.73_m2} (ref >59)
Globulin, Total: 2.9 g/dL (ref 1.5–4.5)
Glucose: 241 mg/dL — ABNORMAL HIGH (ref 65–99)
Potassium: 4.6 mmol/L (ref 3.5–5.2)
Sodium: 136 mmol/L (ref 134–144)
Total Protein: 7.3 g/dL (ref 6.0–8.5)

## 2019-02-05 LAB — LIPID PANEL
Chol/HDL Ratio: 6 ratio — ABNORMAL HIGH (ref 0.0–4.4)
Cholesterol, Total: 281 mg/dL — ABNORMAL HIGH (ref 100–199)
HDL: 47 mg/dL (ref >39)
LDL Chol Calc (NIH): 137 mg/dL — ABNORMAL HIGH (ref 0–99)
Triglycerides: 525 mg/dL — ABNORMAL HIGH (ref 0–149)
VLDL Cholesterol Cal: 97 mg/dL — ABNORMAL HIGH (ref 5–40)

## 2019-02-05 LAB — HEMOGLOBIN A1C
Est. average glucose Bld gHb Est-mCnc: 338 mg/dL
Hgb A1c MFr Bld: 13.4 % — ABNORMAL HIGH (ref 4.8–5.6)

## 2019-02-11 ENCOUNTER — Other Ambulatory Visit: Payer: Self-pay | Admitting: Pharmacy Technician

## 2019-02-11 NOTE — Patient Outreach (Signed)
[facility]) Care Management  02/11/2019  Gabriella Acosta February 23, 1945 [id number]    Follow up call placed to [facility] regarding patient assistance application(s) for Gabriella Acosta confirms that patient has been approved as of 1/29 until 01/09/20. [facility] will outreach patient when order is ready for shipping.  Follow up:  Will route note to [facility] Embedded RPh Raynelle Fanning P to inform.  Suzan Slick Effie Shy CPhT Certified Pharmacy Technician [facility] Care Management Direct Dial:[phone]

## 2019-02-12 ENCOUNTER — Ambulatory Visit: Payer: Self-pay | Admitting: Pharmacist

## 2019-02-12 DIAGNOSIS — E1165 Type 2 diabetes mellitus with hyperglycemia: Secondary | ICD-10-CM

## 2019-02-13 NOTE — Progress Notes (Signed)
She can increase to 25 units daily

## 2019-02-17 ENCOUNTER — Ambulatory Visit (INDEPENDENT_AMBULATORY_CARE_PROVIDER_SITE_OTHER): Payer: Medicare PPO | Admitting: Pharmacist

## 2019-02-17 ENCOUNTER — Other Ambulatory Visit: Payer: Self-pay

## 2019-02-17 DIAGNOSIS — E785 Hyperlipidemia, unspecified: Secondary | ICD-10-CM

## 2019-02-17 DIAGNOSIS — E1165 Type 2 diabetes mellitus with hyperglycemia: Secondary | ICD-10-CM

## 2019-02-17 DIAGNOSIS — E782 Mixed hyperlipidemia: Secondary | ICD-10-CM

## 2019-02-17 MED ORDER — TELMISARTAN 20 MG PO TABS: 20.0000 mg | ORAL_TABLET | Freq: Every day | ORAL | 1 refills | Status: DC

## 2019-02-17 MED ORDER — ATORVASTATIN CALCIUM 10 MG PO TABS: 10.0000 mg | ORAL_TABLET | Freq: Every day | ORAL | 2 refills | Status: DC

## 2019-02-17 MED ORDER — AMLODIPINE BESYLATE 5 MG PO TABS: 5.0000 mg | ORAL_TABLET | Freq: Every day | ORAL | 0 refills | Status: DC

## 2019-02-17 NOTE — Progress Notes (Signed)
  Chronic Care Management   Outreach Note  02/13/2019 Name: Gabriella Acosta MRN: [id number] DOB: 08/01/1945  Referred by: [name], FNP Reason for referral : Chronic Care Management (Diabetes)   An unsuccessful telephone outreach was attempted today. The patient was referred to the case management team for assistance with care management and care coordination.   Follow Up Plan: A HIPPA compliant phone message was left for the patient providing contact information and requesting a return call.  The care management team will reach out to the patient again over the next 7-10 days.   SIGNATURE Kieth Brightly, PharmD, BCPS Clinical Pharmacist, [facility]  [facility]  Direct Dial: [phone]

## 2019-02-18 ENCOUNTER — Other Ambulatory Visit: Payer: Self-pay | Admitting: Pharmacy Technician

## 2019-02-18 NOTE — Patient Outreach (Addendum)
[facility]) Care Management  02/18/2019  Gabriella Acosta [birth year] [id number]    Follow up call placed to [employer] regarding patient assistance application(s) for [name] confirms application has been received. Attestation form mailed out to patient on 02/03/19 for completion.   Incoming message from [facility] Embedded RPh Kandra Nicolas stating she assisted patient with completing attestation form. Patient to mail form back into [employer].  Will follow up with [employer] in 10-14 business days to check application status.  Suzan Slick Effie Shy CPhT Certified Pharmacy Technician [facility] Care Management Direct [phone]

## 2019-02-20 NOTE — Progress Notes (Signed)
Chronic Care Management    Visit Note  02/17/2019 Name: Gabriella Acosta MRN: [id number] DOB: Apr 09, 1945  Referred by: [name], FNP Reason for referral : Chronic Care Management (DIABETES)   Gabriella Acosta is a 74 y.o. year old female who is a primary care patient of [name], FNP. The CCM team was consulted for assistance with chronic disease management and care coordination needs related to DMII  Review of patient status, including review of consultants reports, relevant laboratory and other test results, and collaboration with appropriate care team members and the patient's provider was performed as part of comprehensive patient evaluation and provision of chronic care management services.     Medications: Outpatient Encounter Medications as of 02/17/2019  Medication Sig  . amLODipine (NORVASC) 5 MG tablet Take 1 tablet (5 mg total) by mouth daily.  [name] atorvastatin (LIPITOR) 10 MG tablet Take 1 tablet (10 mg total) by mouth daily.  . Blood Glucose Monitoring Suppl DEVI Check blood sugars twice daily. E11.9  . diclofenac sodium (VOLTAREN) 1 % GEL Apply 2 g topically 4 (four) times daily. (Patient not taking: Reported on 12/10/2018)  . fluconazole (DIFLUCAN) 100 MG tablet Take 1 tablet by mouth today then repeat in 5 days.  [name] glucose blood (ACCU-CHEK GUIDE) test strip Use as instructed  . insulin degludec (TRESIBA FLEXTOUCH) 100 UNIT/ML SOPN FlexTouch Pen Inject 0.2 mLs (20 Units total) into the skin daily.  . Insulin Pen Needle (PEN NEEDLES) 32G X 4 MM MISC 1 Stick by Does not apply route daily.  . Lancets (ACCU-CHEK SOFT TOUCH) lancets Use as instructed  . magnesium 30 MG tablet Take 30 mg by mouth once a week.  . meloxicam (MOBIC) 15 MG tablet TK 1 T PO QD AS NEEDED WITH FOOD  . potassium chloride (KLOR-CON 10) 10 MEQ tablet Take 1 tablet (10 mEq total) by mouth daily.  . sitaGLIPtin (JANUVIA) 25 MG tablet Take 1 tablet (25 mg total) by mouth daily.  [name] telmisartan  (MICARDIS) 20 MG tablet Take 1 tablet (20 mg total) by mouth daily.  . [DISCONTINUED] amLODipine (NORVASC) 5 MG tablet Take 1 tablet (5 mg total) by mouth daily.  . [DISCONTINUED] atorvastatin (LIPITOR) 10 MG tablet Take 1 tablet (10 mg total) by mouth daily.  . [DISCONTINUED] telmisartan (MICARDIS) 20 MG tablet TAKE 1 TABLET BY MOUTH EVERY DAY   No facility-administered encounter medications on file as of 02/17/2019.     Objective:   Goals Addressed            This Visit's Progress     Patient Stated   . I would like to control my diabetes and chronic conditions (pt-stated)       Current Barriers:  . Diabetes: T2DM; most recent A1c 13.7% on 01/15/2019 was 10.6% on 10/02/18  . Current antihyperglycemic regimen: Tresiba 30 units qHS, Tradjenta-->Januvia 100mg  o Samples provided for patient and Januvia 100mg ) on 01/15/19 o [employer] patient assistance ) submitted/approved  o Will apply for [employer] 03/15/19) assistance since able to obtain (pt mailed back letter of attestation) . Denies hypoglycemic symptoms; denies hyperglycemic symptoms . Current meal patterns: will discuss . Current exercise: walking . Current blood glucose readings: FBG 180-200--Increased insulin to 30 . Cardiovascular risk reduction: o Current hypertensive regimen: telmisartan, amlodipine o Current hyperlipidemia regimen: rosuvastatin (not taking due to cost-applying for medicaid)  Pharmacist Clinical Goal(s):  Psychologist, sport and exercise Over the next 90 days, patient with work with PharmD and primary care provider to address needs related  to optimization of medication management of chronic conditions (diabetes)  Interventions: . Comprehensive medication review performed, medication list updated in electronic medical record . Discussed the importance of diet & exercise . Application for medicaid completed-->patient denied, will move forward with PAP once letter received from patient   Patient Self Care Activities:  . Patient  will check blood glucose daily, document, and provide at future appointments . Patient will focus on medication adherence by continuing to take medications as prescribed . Patient will take medications as prescribed . Patient will contact provider with any episodes of hypoglycemia . Patient will report any questions or concerns to provider   Please see past updates related to this goal by clicking on the "Past Updates" button in the selected goal         Plan:   The care management team will reach out to the patient again over the next 30 days.   Provider Signature  Regina Eck, PharmD, BCPS Clinical Pharmacist, [facility] [place]: [phone]

## 2019-02-25 ENCOUNTER — Other Ambulatory Visit: Payer: Self-pay

## 2019-02-25 DIAGNOSIS — E1165 Type 2 diabetes mellitus with hyperglycemia: Secondary | ICD-10-CM

## 2019-02-25 MED ORDER — FLUCONAZOLE 100 MG PO TABS: ORAL_TABLET | ORAL | 0 refills | Status: DC

## 2019-03-16 ENCOUNTER — Other Ambulatory Visit: Payer: Self-pay | Admitting: Nurse Practitioner

## 2019-03-27 ENCOUNTER — Ambulatory Visit (INDEPENDENT_AMBULATORY_CARE_PROVIDER_SITE_OTHER): Payer: Medicare PPO | Admitting: Pharmacist

## 2019-03-27 DIAGNOSIS — E1165 Type 2 diabetes mellitus with hyperglycemia: Secondary | ICD-10-CM

## 2019-03-27 DIAGNOSIS — E785 Hyperlipidemia, unspecified: Secondary | ICD-10-CM

## 2019-03-27 NOTE — Progress Notes (Signed)
Chronic Care Management   Visit Note  03/31/2019 Name: Gabriella Acosta MRN: [id number] DOB: July 29, 1945  Referred by: [name], FNP Reason for referral : Chronic Care Management and Diabetes   Gabriella Acosta is a 74 y.o. year old female who is a primary care patient of [name], [place]. The CCM team was consulted for assistance with chronic disease management and care coordination needs related to DMII  Review of patient status, including review of consultants reports, relevant laboratory and other test results, and collaboration with appropriate care team members and the patient's provider was performed as part of comprehensive patient evaluation and provision of chronic care management services.    SDOH (Social Determinants of Health) assessments performed: Yes See Care Plan activities for detailed interventions related to SDOH     Medications: Outpatient Encounter Medications as of 03/27/2019  Medication Sig Note  . Insulin Glargine (BASAGLAR KWIKPEN) 100 UNIT/ML Inject 30 Units into the skin at bedtime. 03/31/2019: [employer] patient assistance  . amLODipine (NORVASC) 5 MG tablet TAKE 1 TABLET(5 MG) BY MOUTH DAILY   . atorvastatin (LIPITOR) 10 MG tablet Take 1 tablet (10 mg total) by mouth daily.   . Blood Glucose Monitoring Suppl DEVI Check blood sugars twice daily. E11.9   . diclofenac sodium (VOLTAREN) 1 % GEL Apply 2 g topically 4 (four) times daily. (Patient not taking: Reported on 12/10/2018)   . fluconazole (DIFLUCAN) 100 MG tablet Take 1 tablet by mouth today then repeat in 5 days.   [name] glucose blood (ACCU-CHEK GUIDE) test strip Use as instructed   . Insulin Pen Needle (PEN NEEDLES) 32G X 4 MM MISC 1 Stick by Does not apply route daily.   . Lancets (ACCU-CHEK SOFT TOUCH) lancets Use as instructed   . magnesium 30 MG tablet Take 30 mg by mouth once a week.   . meloxicam (MOBIC) 15 MG tablet TK 1 T PO QD AS NEEDED WITH FOOD   . potassium chloride (KLOR-CON 10) 10 MEQ  tablet Take 1 tablet (10 mEq total) by mouth daily.   . sitaGLIPtin (JANUVIA) 25 MG tablet Take 1 tablet (25 mg total) by mouth daily. 03/31/2019: Getting samples  . telmisartan (MICARDIS) 20 MG tablet Take 1 tablet (20 mg total) by mouth daily.   . [DISCONTINUED] insulin degludec (TRESIBA FLEXTOUCH) 100 UNIT/ML SOPN FlexTouch Pen Inject 0.2 mLs (20 Units total) into the skin daily.    No facility-administered encounter medications on file as of 03/27/2019.     Objective:   Goals Addressed            This Visit's Progress     Patient Stated   . I would like to control my diabetes and chronic conditions (pt-stated)       Current Barriers:  . Diabetes: T2DM; most recent A1c 13.7% on 01/15/2019 was 10.6% on 10/02/18  . Current antihyperglycemic regimen: Tresiba 30 units qHS (patient was taking 25u, FBGs still 200), Tradjenta-->Januvia 100mg  o Samples provided for patient (Januvia 100mg ) on 03/31/19 o [employer] patient assistance Environmental health practitioner) submitted/approved  o Will apply for [employer]) assistance since able to obtain (pt mailed back letter of attestation without application so unable to process; incoming pharmacy team to take over) . Denies hypoglycemic symptoms; denies hyperglycemic symptoms . Current meal patterns: will discuss . Current exercise: walking . Current blood glucose readings: FBG 180-200--Increased insulin to 30 (patient has not increased--encouraged her to increase insulin to 30 units qHS) . Cardiovascular risk reduction: o Current hypertensive regimen: telmisartan,  amlodipine o Current hyperlipidemia regimen: rosuvastatin (not taking due to cost)-->switched patient to atorvastatin  Pharmacist Clinical Goal(s):  [name] Over the next 90 days, patient with work with PharmD and primary care provider to address needs related to optimization of medication management of chronic conditions (diabetes)  Interventions: . Comprehensive medication review performed, medication list  updated in electronic medical record . Discussed the importance of diet & exercise . Application for medicaid completed-->patient denied, will move forward with PAP once letter received from patient   Patient Self Care Activities:  . Patient will check blood glucose daily, document, and provide at future appointments . Patient will focus on medication adherence by continuing to take medications as prescribed . Patient will take medications as prescribed . Patient will contact provider with any episodes of hypoglycemia . Patient will report any questions or concerns to provider   Please see past updates related to this goal by clicking on the "Past Updates" button in the selected goal            Provider Signature  Kieth Brightly, PharmD, BCPS Clinical Pharmacist, [facility]  [facility]  Direct Dial: [phone]

## 2019-03-31 NOTE — Patient Instructions (Signed)
Visit Information  Goals Addressed            This Visit's Progress     Patient Stated   . I would like to control my diabetes and chronic conditions (pt-stated)       Current Barriers:  . Diabetes: T2DM; most recent A1c 13.7% on 01/15/2019 was 10.6% on 10/02/18  . Current antihyperglycemic regimen: Tresiba 30 units qHS (patient was taking 25u, FBGs still 200), Tradjenta-->Januvia 100mg  o Samples provided for patient (Januvia 100mg ) on 03/31/19 o [employer] patient assistance ) submitted/approved  o Will apply for [employer] 04/02/19) assistance since able to obtain (pt mailed back letter of attestation without application so unable to process; incoming pharmacy team to take over) . Denies hypoglycemic symptoms; denies hyperglycemic symptoms . Current meal patterns: will discuss . Current exercise: walking . Current blood glucose readings: FBG 180-200--Increased insulin to 30 (patient has not increased--encouraged her to increase insulin to 30 units qHS) . Cardiovascular risk reduction: o Current hypertensive regimen: telmisartan, amlodipine o Current hyperlipidemia regimen: rosuvastatin (not taking due to cost)-->switched patient to atorvastatin  Pharmacist Clinical Goal(s):  Psychologist, sport and exercise Over the next 90 days, patient with work with PharmD and primary care provider to address needs related to optimization of medication management of chronic conditions (diabetes)  Interventions: . Comprehensive medication review performed, medication list updated in electronic medical record . Discussed the importance of diet & exercise . Application for medicaid completed-->patient denied, will move forward with PAP once letter received from patient   Patient Self Care Activities:  . Patient will check blood glucose daily, document, and provide at future appointments . Patient will focus on medication adherence by continuing to take medications as prescribed . Patient will take medications as  prescribed . Patient will contact provider with any episodes of hypoglycemia . Patient will report any questions or concerns to provider   Please see past updates related to this goal by clicking on the "Past Updates" button in the selected goal         The patient verbalized understanding of instructions provided today and declined a print copy of patient instruction materials.   SIGNATURE [employer], PharmD, BCPS Clinical Pharmacist, [facility]  [facility]  Direct Dial: [phone]

## 2019-06-05 ENCOUNTER — Other Ambulatory Visit: Payer: Self-pay | Admitting: Nurse Practitioner

## 2019-06-05 DIAGNOSIS — E782 Mixed hyperlipidemia: Secondary | ICD-10-CM

## 2019-06-18 ENCOUNTER — Other Ambulatory Visit: Payer: Self-pay | Admitting: Nurse Practitioner

## 2019-07-04 ENCOUNTER — Telehealth: Payer: Self-pay

## 2019-07-04 ENCOUNTER — Ambulatory Visit: Payer: Self-pay

## 2019-07-04 DIAGNOSIS — E1165 Type 2 diabetes mellitus with hyperglycemia: Secondary | ICD-10-CM

## 2019-07-04 DIAGNOSIS — I10 Essential (primary) hypertension: Secondary | ICD-10-CM

## 2019-07-04 NOTE — Chronic Care Management (AMB) (Signed)
  Chronic Care Management   Outreach Note  07/04/2019 Name: Gabriella Acosta MRN: [id number] DOB: 1945-10-11  Referred by: [name], FNP Reason for referral : Care Coordination   SW placed an unsuccessful outbound call to the patient to assess for ongoing care management and care coordination needs. Unfortunately, the patients voice mailbox is full which prohibited SW from leaving a HIPAA compliant voice message.   SW reviewed chart to note patient not currently active with RN Care Manager or PharmD. Collaboration with RN Care Manager regarding patient follow up. Referral placed to PharmD to assist with disease management.   Follow Up Plan: The care management team will reach out to the patient again over the next 14 days.   Bevelyn Ngo, BSW, CDP Social Worker, Certified Dementia Practitioner [facility] Care Management [phone]

## 2019-07-04 NOTE — Addendum Note (Signed)
Addended byBevelyn Ngo on: 07/04/2019 02:39 PM   Modules accepted: Orders

## 2019-07-07 ENCOUNTER — Telehealth: Payer: Self-pay | Admitting: Nurse Practitioner

## 2019-07-07 NOTE — Chronic Care Management (AMB) (Signed)
  Chronic Care Management   Note  07/07/2019 Name: Gabriella Acosta MRN: [id number] DOB: Feb 28, 1945  Gabriella Acosta is a 74 y.o. year old female who is a primary care patient of [name], [place] and is actively engaged with the care management team. I reached out to [name] by phone today to assist with scheduling an initial visit with the Pharmacist.  [name] was given information about Chronic Care Management services today including:  1. CCM service includes personalized support from designated clinical staff supervised by her physician, including individualized plan of care and coordination with other care providers 2. 24/7 contact phone numbers for assistance for urgent and routine care needs. 3. Service will only be billed when office clinical staff spend 20 minutes or more in a month to coordinate care. 4. Only one practitioner may furnish and bill the service in a calendar month. 5. The patient may stop CCM services at any time (effective at the end of the month) by phone call to the office staff. 6. The patient will be responsible for cost sharing (co-pay) of up to 20% of the service fee (after annual deductible is met).  Patient agreed to services and verbal consent obtained.     Follow up plan: Patient prefers telephone appointment with care management team member and is scheduled for: 08/04/2019.  [place], [place] [id number] Direct Dial: [phone] [email or web address]: [email or web address]

## 2019-07-17 ENCOUNTER — Ambulatory Visit: Payer: Medicare PPO

## 2019-07-17 DIAGNOSIS — I10 Essential (primary) hypertension: Secondary | ICD-10-CM

## 2019-07-17 DIAGNOSIS — E1165 Type 2 diabetes mellitus with hyperglycemia: Secondary | ICD-10-CM

## 2019-07-17 NOTE — Chronic Care Management (AMB) (Signed)
  Chronic Care Management    Social Work Follow Up Note  07/17/2019 Name: Gabriella Acosta MRN: [id number] DOB: [birth year]  Gabriella Acosta is a 74 y.o. year old female who is a primary care patient of [name], FNP. The CCM team was consulted for assistance with care coordination.   Review of patient status, including review of consultants reports, other relevant assessments, and collaboration with appropriate care team members and the patient's provider was performed as part of comprehensive patient evaluation and provision of chronic care management services.    SDOH (Social Determinants of Health) assessments performed: Yes; no acute challenges noted at this time. SDOH Interventions     Most Recent Value  SDOH Interventions  Food Insecurity Interventions Intervention Not Indicated  Housing Interventions Intervention Not Indicated  Transportation Interventions Intervention Not Indicated       Outpatient Encounter Medications as of 07/17/2019  Medication Sig Note  . amLODipine (NORVASC) 5 MG tablet TAKE 1 TABLET(5 MG) BY MOUTH DAILY   . atorvastatin (LIPITOR) 10 MG tablet TAKE 1 TABLET(10 MG) BY MOUTH DAILY   . Blood Glucose Monitoring Suppl DEVI Check blood sugars twice daily. E11.9   . diclofenac sodium (VOLTAREN) 1 % GEL Apply 2 g topically 4 (four) times daily. (Patient not taking: Reported on 12/10/2018)   . fluconazole (DIFLUCAN) 100 MG tablet Take 1 tablet by mouth today then repeat in 5 days.   [name] glucose blood (ACCU-CHEK GUIDE) test strip Use as instructed   . Insulin Glargine (BASAGLAR KWIKPEN) 100 UNIT/ML Inject 30 Units into the skin at bedtime. 03/31/2019: [employer] patient assistance  . Insulin Pen Needle (PEN NEEDLES) 32G X 4 MM MISC 1 Stick by Does not apply route daily.   . Lancets (ACCU-CHEK SOFT TOUCH) lancets Use as instructed   . magnesium 30 MG tablet Take 30 mg by mouth once a week.   . meloxicam (MOBIC) 15 MG tablet TK 1 T PO QD AS NEEDED WITH FOOD   .  potassium chloride (KLOR-CON 10) 10 MEQ tablet Take 1 tablet (10 mEq total) by mouth daily.   . sitaGLIPtin (JANUVIA) 25 MG tablet Take 1 tablet (25 mg total) by mouth daily. 03/31/2019: Getting samples  . telmisartan (MICARDIS) 20 MG tablet Take 1 tablet (20 mg total) by mouth daily.    No facility-administered encounter medications on file as of 07/17/2019.    Follow Up Plan: No SW follow up planned at this time; encouraged patient to contact SW directly with future resource needs. The patient will remain active with embedded PharmD.   Bevelyn Ngo, BSW, CDP Social Worker, Certified Dementia Practitioner [facility] Care Management [phone]

## 2019-07-29 ENCOUNTER — Ambulatory Visit: Payer: Medicare Other | Admitting: Nurse Practitioner

## 2019-07-29 ENCOUNTER — Ambulatory Visit: Payer: Medicare Other | Admitting: Internal Medicine

## 2019-07-29 ENCOUNTER — Ambulatory Visit: Payer: Medicare Other

## 2019-07-30 ENCOUNTER — Ambulatory Visit: Payer: Medicare PPO | Admitting: Nurse Practitioner

## 2019-07-30 ENCOUNTER — Other Ambulatory Visit: Payer: Self-pay | Admitting: Nurse Practitioner

## 2019-07-30 ENCOUNTER — Other Ambulatory Visit: Payer: Self-pay

## 2019-07-30 ENCOUNTER — Encounter: Payer: Self-pay | Admitting: Nurse Practitioner

## 2019-07-30 ENCOUNTER — Ambulatory Visit (INDEPENDENT_AMBULATORY_CARE_PROVIDER_SITE_OTHER): Payer: Medicare PPO

## 2019-07-30 VITALS — BP 124/70 | HR 86 | Temp 98.2°F | Ht 63.6 in | Wt 131.2 lb

## 2019-07-30 DIAGNOSIS — E1165 Type 2 diabetes mellitus with hyperglycemia: Secondary | ICD-10-CM

## 2019-07-30 DIAGNOSIS — E782 Mixed hyperlipidemia: Secondary | ICD-10-CM | POA: Diagnosis not present

## 2019-07-30 DIAGNOSIS — Z0000 Encounter for general adult medical examination without abnormal findings: Secondary | ICD-10-CM | POA: Diagnosis not present

## 2019-07-30 DIAGNOSIS — Z111 Encounter for screening for respiratory tuberculosis: Secondary | ICD-10-CM

## 2019-07-30 DIAGNOSIS — Z5320 Procedure and treatment not carried out because of patient's decision for unspecified reasons: Secondary | ICD-10-CM | POA: Diagnosis not present

## 2019-07-30 DIAGNOSIS — I10 Essential (primary) hypertension: Secondary | ICD-10-CM

## 2019-07-30 DIAGNOSIS — Z23 Encounter for immunization: Secondary | ICD-10-CM | POA: Diagnosis not present

## 2019-07-30 MED ORDER — FLUCONAZOLE 100 MG PO TABS: ORAL_TABLET | ORAL | 0 refills | Status: DC

## 2019-07-30 MED ORDER — PNEUMOCOCCAL 13-VAL CONJ VACC IM SUSP: 0.5000 mL | INTRAMUSCULAR | 0 refills | Status: AC

## 2019-07-30 NOTE — Patient Instructions (Signed)
Gabriella Acosta , Thank you for taking time to come for your Medicare Wellness Visit. I appreciate your ongoing commitment to your health goals. Please review the following plan we discussed and let me know if I can assist you in the future.   Screening recommendations/referrals: Colonoscopy: decline Mammogram: decline Bone Density: decline Recommended yearly ophthalmology/optometry visit for glaucoma screening and checkup Recommended yearly dental visit for hygiene and checkup  Vaccinations: Influenza vaccine: decline Pneumococcal vaccine: decline Tdap vaccine: completed 07/29/2013, due 07/30/2023 Shingles vaccine: discussed   Covid-19:03/17/2019  Advanced directives: Advance directive discussed with you today. Even though you declined this today please call our office should you change your mind and we can give you the proper paperwork for you to fill out.   Conditions/risks identified: smoking  Next appointment:12/16/2019 at 2:15  Follow up in one year for your annual wellness visit    Preventive Care 65 Years and Older, Female Preventive care refers to lifestyle choices and visits with your health care provider that can promote health and wellness. What does preventive care include?  A yearly physical exam. This is also called an annual well check.  Dental exams once or twice a year.  Routine eye exams. Ask your health care provider how often you should have your eyes checked.  Personal lifestyle choices, including:  Daily care of your teeth and gums.  Regular physical activity.  Eating a healthy diet.  Avoiding tobacco and drug use.  Limiting alcohol use.  Practicing safe sex.  Taking low-dose aspirin every day.  Taking vitamin and mineral supplements as recommended by your health care provider. What happens during an annual well check? The services and screenings done by your health care provider during your annual well check will depend on your age, overall health,  lifestyle risk factors, and family history of disease. Counseling  Your health care provider may ask you questions about your:  Alcohol use.  Tobacco use.  Drug use.  Emotional well-being.  Home and relationship well-being.  Sexual activity.  Eating habits.  History of falls.  Memory and ability to understand (cognition).  Work and work Astronomer.  Reproductive health. Screening  You may have the following tests or measurements:  Height, weight, and BMI.  Blood pressure.  Lipid and cholesterol levels. These may be checked every 5 years, or more frequently if you are over 5 years old.  Skin check.  Lung cancer screening. You may have this screening every year starting at age 41 if you have a 30-pack-year history of smoking and currently smoke or have quit within the past 15 years.  Fecal occult blood test (FOBT) of the stool. You may have this test every year starting at age 72.  Flexible sigmoidoscopy or colonoscopy. You may have a sigmoidoscopy every 5 years or a colonoscopy every 10 years starting at age 72.  Hepatitis C blood test.  Hepatitis B blood test.  Sexually transmitted disease (STD) testing.  Diabetes screening. This is done by checking your blood sugar (glucose) after you have not eaten for a while (fasting). You may have this done every 1-3 years.  Bone density scan. This is done to screen for osteoporosis. You may have this done starting at age 72.  Mammogram. This may be done every 1-2 years. Talk to your health care provider about how often you should have regular mammograms. Talk with your health care provider about your test results, treatment options, and if necessary, the need for more tests. Vaccines  Your health  care provider may recommend certain vaccines, such as:  Influenza vaccine. This is recommended every year.  Tetanus, diphtheria, and acellular pertussis (Tdap, Td) vaccine. You may need a Td booster every 10 years.  Zoster  vaccine. You may need this after age 72.  Pneumococcal 13-valent conjugate (PCV13) vaccine. One dose is recommended after age 72.  Pneumococcal polysaccharide (PPSV23) vaccine. One dose is recommended after age 90. Talk to your health care provider about which screenings and vaccines you need and how often you need them. This information is not intended to replace advice given to you by your health care provider. Make sure you discuss any questions you have with your health care provider. Document Released: 01/22/2015 Document Revised: 09/15/2015 Document Reviewed: 10/27/2014 Elsevier Interactive Patient Education  2017 [place] Prevention in the Home Falls can cause injuries. They can happen to people of all ages. There are many things you can do to make your home safe and to help prevent falls. What can I do on the outside of my home?  Regularly fix the edges of walkways and driveways and fix any cracks.  Remove anything that might make you trip as you walk through a door, such as a raised step or threshold.  Trim any bushes or trees on the path to your home.  Use bright outdoor lighting.  Clear any walking paths of anything that might make someone trip, such as rocks or tools.  Regularly check to see if handrails are loose or broken. Make sure that both sides of any steps have handrails.  Any raised decks and porches should have guardrails on the edges.  Have any leaves, snow, or ice cleared regularly.  Use sand or salt on walking paths during winter.  Clean up any spills in your garage right away. This includes oil or grease spills. What can I do in the bathroom?  Use night lights.  Install grab bars by the toilet and in the tub and shower. Do not use towel bars as grab bars.  Use non-skid mats or decals in the tub or shower.  If you need to sit down in the shower, use a plastic, non-slip stool.  Keep the floor dry. Clean up any water that spills on the  floor as soon as it happens.  Remove soap buildup in the tub or shower regularly.  Attach bath mats securely with double-sided non-slip rug tape.  Do not have throw rugs and other things on the floor that can make you trip. What can I do in the bedroom?  Use night lights.  Make sure that you have a light by your bed that is easy to reach.  Do not use any sheets or blankets that are too big for your bed. They should not hang down onto the floor.  Have a firm chair that has side arms. You can use this for support while you get dressed.  Do not have throw rugs and other things on the floor that can make you trip. What can I do in the kitchen?  Clean up any spills right away.  Avoid walking on wet floors.  Keep items that you use a lot in easy-to-reach places.  If you need to reach something above you, use a strong step stool that has a grab bar.  Keep electrical cords out of the way.  Do not use floor polish or wax that makes floors slippery. If you must use wax, use non-skid floor wax.  Do not have  throw rugs and other things on the floor that can make you trip. What can I do with my stairs?  Do not leave any items on the stairs.  Make sure that there are handrails on both sides of the stairs and use them. Fix handrails that are broken or loose. Make sure that handrails are as long as the stairways.  Check any carpeting to make sure that it is firmly attached to the stairs. Fix any carpet that is loose or worn.  Avoid having throw rugs at the top or bottom of the stairs. If you do have throw rugs, attach them to the floor with carpet tape.  Make sure that you have a light switch at the top of the stairs and the bottom of the stairs. If you do not have them, ask someone to add them for you. What else can I do to help prevent falls?  Wear shoes that:  Do not have high heels.  Have rubber bottoms.  Are comfortable and fit you well.  Are closed at the toe. Do not wear  sandals.  If you use a stepladder:  Make sure that it is fully opened. Do not climb a closed stepladder.  Make sure that both sides of the stepladder are locked into place.  Ask someone to hold it for you, if possible.  Clearly mark and make sure that you can see:  Any grab bars or handrails.  First and last steps.  Where the edge of each step is.  Use tools that help you move around (mobility aids) if they are needed. These include:  Canes.  Walkers.  Scooters.  Crutches.  Turn on the lights when you go into a dark area. Replace any light bulbs as soon as they burn out.  Set up your furniture so you have a clear path. Avoid moving your furniture around.  If any of your floors are uneven, fix them.  If there are any pets around you, be aware of where they are.  Review your medicines with your doctor. Some medicines can make you feel dizzy. This can increase your chance of falling. Ask your doctor what other things that you can do to help prevent falls. This information is not intended to replace advice given to you by your health care provider. Make sure you discuss any questions you have with your health care provider. Document Released: 10/22/2008 Document Revised: 06/03/2015 Document Reviewed: 01/30/2014 Elsevier Interactive Patient Education  2017 [employer].

## 2019-07-30 NOTE — Progress Notes (Signed)
This visit occurred during the SARS-CoV-2 public health emergency.  Safety protocols were in place, including screening questions prior to the visit, additional usage of staff PPE, and extensive cleaning of exam room while observing appropriate contact time as indicated for disinfecting solutions.  Subjective:     Patient ID: Gabriella Acosta , female    DOB: March 23, 1945 , 74 y.o.   MRN: [id number]   Chief Complaint  Patient presents with  . Diabetes  . Hypertension  . Hyperlipidemia    HPI  Present today for follow up with HTN and DM. She reports no issues with her blood sugars but doesn't check them at home.She is compliant with her medications and reports that she is watching her salt but does eat bread. She doesn't use salt and drinks water daily.   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 She reports that she wakes nightly in a sweat that soaks the bed. She has been told that she snores and that she has never had a sleep study.   She refuses to have a colonoscopy or cologaurd. She was educated about the importance and reason for recommendation for screening. She is interested in the PNA vaccine.   Wt Readings from Last 3 Encounters: 07/30/19 : 131 lb 2.8 oz (59.5 kg) 07/30/19 : 131 lb 3.2 oz (59.5 kg) 02/04/19 : 125 lb 6.4 oz (56.9 kg)    Diabetes She presents for her follow-up diabetic visit. She has type 2 diabetes mellitus. No MedicAlert identification noted. Her disease course has been worsening. Hypoglycemia symptoms include sweats. Pertinent negatives for hypoglycemia include no dizziness or headaches. (Nightly sweating) Pertinent negatives for diabetes include no visual change, no weakness and no weight loss. Symptoms are stable. Pertinent negatives for diabetic complications include no peripheral neuropathy, PVD or retinopathy. Risk factors for coronary artery disease include diabetes mellitus, sedentary lifestyle, dyslipidemia  and post-menopausal. Current diabetic treatment includes oral agent (dual therapy) and insulin injections. She is compliant with treatment all of the time. She is currently taking insulin at bedtime. Insulin injections are given by patient. Rotation of sites for injection: hip. (Doesn't monitor CBG's)  Hypertension This is a chronic problem. The current episode started more than 1 year ago. The problem is unchanged. The problem is controlled. Associated symptoms include sweats. Pertinent negatives include no headaches or palpitations. There are no associated agents to hypertension. Risk factors for coronary artery disease include sedentary lifestyle. Past treatments include angiotensin blockers and calcium channel blockers. The current treatment provides mild improvement. There are no compliance problems.  There is no history of PVD or retinopathy.  Hyperlipidemia This is a chronic problem. The problem is uncontrolled. Recent lipid tests were reviewed and are high. Exacerbating diseases include diabetes. Factors aggravating her hyperlipidemia include fatty foods. Pertinent negatives include no leg pain or myalgias. Current antihyperlipidemic treatment includes statins. There are no compliance problems.  Risk factors for coronary artery disease include post-menopausal, a sedentary lifestyle and diabetes mellitus.     Past Medical History:  Diagnosis Date  . Hypertension      Family History  Problem Relation Age of Onset  . Diabetes Mother   . Diabetes Father   . Heart disease Father      Current Outpatient Medications:  .  amLODipine (NORVASC) 5 MG tablet, TAKE 1 TABLET(5 MG) BY MOUTH DAILY, Disp: 90 tablet, Rfl: 0 .  atorvastatin (LIPITOR) 10 MG tablet, TAKE 1 TABLET(10 MG) BY MOUTH DAILY, Disp:  90 tablet, Rfl: 1 .  Blood Glucose Monitoring Suppl DEVI, Check blood sugars twice daily. E11.9, Disp: 1 kit, Rfl: 0 .  diclofenac sodium (VOLTAREN) 1 % GEL, Apply 2 g topically 4 (four) times daily.  (Patient not taking: Reported on 12/10/2018), Disp: 100 g, Rfl: 1 .  fluconazole (DIFLUCAN) 100 MG tablet, Take 1 tablet by mouth today then repeat in 5 days., Disp: 2 tablet, Rfl: 0 .  glucose blood (ACCU-CHEK GUIDE) test strip, Use as instructed, Disp: 100 each, Rfl: 12 .  Insulin Glargine (BASAGLAR KWIKPEN) 100 UNIT/ML, Inject 30 Units into the skin at bedtime., Disp: , Rfl:  .  Insulin Pen Needle (PEN NEEDLES) 32G X 4 MM MISC, 1 Stick by Does not apply route daily., Disp: 30 each, Rfl: 2 .  Lancets (ACCU-CHEK SOFT TOUCH) lancets, Use as instructed, Disp: 100 each, Rfl: 12 .  magnesium 30 MG tablet, Take 30 mg by mouth once a week., Disp: , Rfl:  .  meloxicam (MOBIC) 15 MG tablet, TK 1 T PO QD AS NEEDED WITH FOOD, Disp: , Rfl:  .  potassium chloride (KLOR-CON 10) 10 MEQ tablet, Take 1 tablet (10 mEq total) by mouth daily., Disp: 90 tablet, Rfl: 1 .  sitaGLIPtin (JANUVIA) 25 MG tablet, Take 1 tablet (25 mg total) by mouth daily., Disp: 90 tablet, Rfl: 1 .  telmisartan (MICARDIS) 20 MG tablet, Take 1 tablet (20 mg total) by mouth daily., Disp: 90 tablet, Rfl: 1   No Known Allergies   Review of Systems  Constitutional: Negative.  Negative for weight loss.  Eyes: Negative.  Negative for visual disturbance.  Respiratory: Negative.  Negative for wheezing.   Cardiovascular: Negative.  Negative for palpitations and leg swelling.  Musculoskeletal: Negative for myalgias.  Skin: Negative.   Neurological: Negative for dizziness, weakness, numbness and headaches.     Today's Vitals   07/30/19 1428  BP: 124/70  Pulse: 86  Temp: 98.2 F (36.8 C)  Weight: 131 lb 2.8 oz (59.5 kg)  Height: 5' 3.6" (1.615 m)   Body mass index is 22.8 kg/m.   Objective:  Physical Exam Constitutional:      Appearance: Normal appearance. She is normal weight.  Neck:     Vascular: No carotid bruit.  Cardiovascular:     Rate and Rhythm: Normal rate and regular rhythm.     Pulses: Normal pulses.     Heart  sounds: Normal heart sounds. No murmur heard.   Pulmonary:     Effort: Pulmonary effort is normal.     Breath sounds: Normal breath sounds.  Musculoskeletal:        General: No tenderness.     Right lower leg: No edema.     Left lower leg: No edema.  Skin:    General: Skin is warm and dry.     Capillary Refill: Capillary refill takes less than 2 seconds.  Neurological:     General: No focal deficit present.     Mental Status: She is alert and oriented to person, place, and time.  Psychiatric:        Mood and Affect: Mood normal.        Behavior: Behavior normal.        Thought Content: Thought content normal.        Judgment: Judgment normal.         Assessment And Plan:      1. Uncontrolled type 2 diabetes mellitus with hyperglycemia (HCC) Chronic, poorly controlled Continue with current  medications Encouraged to limit intake of sugary foods and drinks Encouraged to increase physical activity to 150 minutes per week, she feels she does not have time to exercise.  - Ambulatory referral to Ophthalmology - Hemoglobin A1c - CMP14+EGFR - CBC - fluconazole (DIFLUCAN) 100 MG tablet; Take 1 tablet by mouth today then repeat in 5 days.  Dispense: 2 tablet; Refill: 0 - QuantiFERON-TB Gold Plus  2. Essential hypertension B/P is controlled.  CMP ordered to check renal function.  The importance of regular exercise and dietary modification was stressed to the patient.   3. Mixed hyperlipidemia  Chronic, controlled  Continue with current medications, tolerating well - Lipid panel  4. Encounter for immunization  - pneumococcal 13-valent conjugate vaccine (PREVNAR 13) SUSP injection; Inject 0.5 mLs into the muscle tomorrow at 10 am for 1 dose.  Dispense: 0.5 mL; Refill: 0  5. Encounter for screening for respiratory tuberculosis  Needed for employment  6. Colonoscopy refused  Educated on the risk of developing colon cancer with or without family history.   She also  refused Cologuard    Patient was given opportunity to ask questions. Patient verbalized understanding of the plan and was able to repeat key elements of the plan. All questions were answered to their satisfaction.  [name], FNP   I, [name], FNP, have reviewed all documentation for this visit. The documentation on 08/01/19 for the exam, diagnosis, procedures, and orders are all accurate and complete.  THE PATIENT IS ENCOURAGED TO PRACTICE SOCIAL DISTANCING DUE TO THE COVID-19 PANDEMIC.
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
## 2019-08-01 LAB — CMP14+EGFR
ALT: 18 IU/L (ref 0–32)
AST: 22 IU/L (ref 0–40)
Albumin/Globulin Ratio: 1.3 (ref 1.2–2.2)
Albumin: 4.5 g/dL (ref 3.7–4.7)
Alkaline Phosphatase: 179 IU/L — ABNORMAL HIGH (ref 48–121)
BUN/Creatinine Ratio: 17 (ref 12–28)
BUN: 10 mg/dL (ref 8–27)
Bilirubin Total: 0.2 mg/dL (ref 0.0–1.2)
CO2: 20 mmol/L (ref 20–29)
Calcium: 9.9 mg/dL (ref 8.7–10.3)
Chloride: 95 mmol/L — ABNORMAL LOW (ref 96–106)
Creatinine, Ser: 0.59 mg/dL (ref 0.57–1.00)
GFR calc Af Amer: 106 mL/min/{1.73_m2} (ref >59)
GFR calc non Af Amer: 92 mL/min/{1.73_m2} (ref >59)
Globulin, Total: 3.5 g/dL (ref 1.5–4.5)
Glucose: 246 mg/dL — ABNORMAL HIGH (ref 65–99)
Potassium: 3.5 mmol/L (ref 3.5–5.2)
Sodium: 139 mmol/L (ref 134–144)
Total Protein: 8 g/dL (ref 6.0–8.5)

## 2019-08-01 LAB — QUANTIFERON-TB GOLD PLUS
QuantiFERON Mitogen Value: >10 IU/mL
QuantiFERON Nil Value: 0 IU/mL
QuantiFERON TB1 Ag Value: 0 IU/mL
QuantiFERON TB2 Ag Value: 0 IU/mL
QuantiFERON-TB Gold Plus: NEGATIVE

## 2019-08-01 LAB — CBC
Hematocrit: 37.7 % (ref 34.0–46.6)
Hemoglobin: 12.6 g/dL (ref 11.1–15.9)
MCH: 28.6 pg (ref 26.6–33.0)
MCHC: 33.4 g/dL (ref 31.5–35.7)
MCV: 86 fL (ref 79–97)
Platelets: 92 10*3/uL — CL (ref 150–450)
RBC: 4.41 x10E6/uL (ref 3.77–5.28)
RDW: 14.2 % (ref 11.7–15.4)
WBC: 9.9 10*3/uL (ref 3.4–10.8)

## 2019-08-01 LAB — LIPID PANEL
Chol/HDL Ratio: 7.6 ratio — ABNORMAL HIGH (ref 0.0–4.4)
Cholesterol, Total: 257 mg/dL — ABNORMAL HIGH (ref 100–199)
HDL: 34 mg/dL — ABNORMAL LOW (ref >39)
Triglycerides: 863 mg/dL (ref 0–149)

## 2019-08-01 LAB — HEMOGLOBIN A1C
Est. average glucose Bld gHb Est-mCnc: 252 mg/dL
Hgb A1c MFr Bld: 10.4 % — ABNORMAL HIGH (ref 4.8–5.6)

## 2019-08-03 ENCOUNTER — Other Ambulatory Visit: Payer: Self-pay | Admitting: Nurse Practitioner

## 2019-08-03 DIAGNOSIS — E782 Mixed hyperlipidemia: Secondary | ICD-10-CM

## 2019-08-03 MED ORDER — ATORVASTATIN CALCIUM 80 MG PO TABS: 80.0000 mg | ORAL_TABLET | Freq: Every day | ORAL | 1 refills | Status: DC

## 2019-08-04 ENCOUNTER — Other Ambulatory Visit: Payer: Self-pay

## 2019-08-04 ENCOUNTER — Ambulatory Visit (INDEPENDENT_AMBULATORY_CARE_PROVIDER_SITE_OTHER): Payer: Medicare PPO

## 2019-08-04 DIAGNOSIS — I10 Essential (primary) hypertension: Secondary | ICD-10-CM | POA: Diagnosis not present

## 2019-08-04 DIAGNOSIS — E1165 Type 2 diabetes mellitus with hyperglycemia: Secondary | ICD-10-CM

## 2019-08-04 DIAGNOSIS — E782 Mixed hyperlipidemia: Secondary | ICD-10-CM | POA: Diagnosis not present

## 2019-08-04 DIAGNOSIS — E876 Hypokalemia: Secondary | ICD-10-CM

## 2019-08-04 MED ORDER — POTASSIUM CHLORIDE ER 10 MEQ PO TBCR: 10.0000 meq | EXTENDED_RELEASE_TABLET | Freq: Every day | ORAL | 1 refills | Status: DC

## 2019-08-04 NOTE — Chronic Care Management (AMB) (Addendum)
Chronic Care Management Pharmacy  Name: Gabriella Acosta  MRN: [id number] DOB: 19-Apr-1945  Chief Complaint/ HPI  Gabriella Acosta,  74 y.o. , female presents for their Initial CCM visit with the clinical pharmacist via telephone due to COVID-19 Pandemic.  PCP : [name], FNP  Their chronic conditions include: Hypertension, Hyperlipidemia, Diabetes and Anxiety  Office Visits: 07/30/19 AWV and OV: HTN/DM follow up. Does not check BG at home. Refused colonoscopy and Cologuard. Interested in pneumonia vaccine. Referred to Ophthalmology. Labs ordered (HgbA1c, CMP14+EGFR, CBC, TB, lipid panel). Encouraged exercise. Continue current meds. Given prescription for PCV13. Atorvastatin increased from 89m daily to 879mdaily.   Consult Visits:  CCM Encounters: 03/27/19 PharmD: Samples of Januvia provided on 3/22. Attestation form mailed back without application so unable to process, incoming pharmacy team to take over. Rosuvastatin switched to atorvastatin due to cost.   02/18/19 CPhT: Confirmed [employer] received patient application for Januvia patient assistance. Waiting on attestation form that was mailed to patient on 02/03/19. Per PharmD, pt has mailed attestation form back to [employer] in 10-14 days  02/17/19 PharmD: Medicaid assistance denied. Assisted patient with PAP applications for Januvia and Basaglar.   02/11/19 CPhT: Basaglar patient assistance approved through [employer] 01/09/20  Medications: Outpatient Encounter Medications as of 08/04/2019  Medication Sig Note   amLODipine (NORVASC) 5 MG tablet TAKE 1 TABLET(5 MG) BY MOUTH DAILY    atorvastatin (LIPITOR) 80 MG tablet Take 1 tablet (80 mg total) by mouth daily.    Blood Glucose Monitoring Suppl DEVI Check blood sugars twice daily. E11.9    glucose blood (ACCU-CHEK GUIDE) test strip Use as instructed    Insulin Glargine (BASAGLAR KWIKPEN) 100 UNIT/ML Inject 30 Units into the skin at bedtime. 03/31/2019: [employer] patient  assistance   Insulin Pen Needle (PEN NEEDLES) 32G X 4 MM MISC 1 Stick by Does not apply route daily.    Lancets (ACCU-CHEK SOFT TOUCH) lancets Use as instructed    magnesium 30 MG tablet Take 30 mg by mouth once a week.    sitaGLIPtin (JANUVIA) 25 MG tablet Take 1 tablet (25 mg total) by mouth daily. (Patient taking differently: Take 50 mg by mouth daily. Taking 1/2 tablet daily) 03/31/2019: Getting samples   telmisartan (MICARDIS) 20 MG tablet Take 1 tablet (20 mg total) by mouth daily.    [DISCONTINUED] potassium chloride (KLOR-CON 10) 10 MEQ tablet Take 1 tablet (10 mEq total) by mouth daily.    diclofenac sodium (VOLTAREN) 1 % GEL Apply 2 g topically 4 (four) times daily. (Patient not taking: Reported on 12/10/2018)    fluconazole (DIFLUCAN) 100 MG tablet Take 1 tablet by mouth today then repeat in 5 days.    meloxicam (MOBIC) 15 MG tablet TK 1 T PO QD AS NEEDED WITH FOOD (Patient not taking: Reported on 08/04/2019)    No facility-administered encounter medications on file as of 08/04/2019.    Current Diagnosis/Assessment:  SDOH Interventions     Most Recent Value  SDOH Interventions  Financial Strain Interventions Other (Comment)  [Assisted with coordination of refills for Januvia from [employer] assistance program]      Goals Addressed            This [facility] longitudinal plan of care for additional care plan information)  Current Barriers:   Chronic Disease Management support, education, and care coordination needs related to Hypertension, Hyperlipidemia,  and Diabetes   Hypertension BP Readings from Last 3 Encounters:  07/30/19 124/70  07/30/19 124/70  02/04/19 122/76    Pharmacist Clinical Goal(s): o Over the next 180 days, patient will work with PharmD and providers to maintain BP goal <130/80  Current regimen:   Amlodipine 68m daily  Telmisartan 275mdaily  Interventions: o Provided  dietary and exercise recommendations o Check blood pressure when light headed or dizzy  Patient self care activities - Over the next 180 days, patient will: o Check BP 3-5 times weekly, document, and provide at future appointments o Ensure daily salt intake < 2300 mg/day o Exercise 10-15 minutes daily 3 times weekly and work towards goal of 30 minutes 5 times weekly  Hyperlipidemia Lab Results  Component Value Date/Time   LDLCALC Comment (A) 07/30/2019 03:33 PM    Pharmacist Clinical Goal(s): o Over the next 90 days, patient will work with PharmD and providers to achieve LDL goal < 70  Current regimen:  o Atorvastatin 8066maily Interventions: o Provided dietary and exercise recommendations o Patient instructed to pick up new prescription for atorvastatin 61m87mily from pharmacy and begin taking immediately (patient has been taking atorvastatin 10mg52mly) o Discussed cholesterol levels and provided patient education  Patient self care activities - Over the next 90 days, patient will: o Exercise 10-15 minutes daily 3 times weekly and work towards goal of 30 minutes 5 times weekly o Limit fried foods, fast food, and junk food o Focus on a healthy, balanced diet using the PLATE method discussed  Diabetes Lab Results  Component Value Date/Time   HGBA1C 10.4 (H) 07/30/2019 03:33 PM   HGBA1C 13.4 (H) 02/04/2019 04:01 PM    Pharmacist Clinical Goal(s): o Over the next 90 days, patient will work with PharmD and providers to achieve A1c goal <7%  Current regimen:   Basaglar Kwikpen 30 units at bedtime  Januvia 50mg 61my  Interventions: o Provided dietary and exercise recommendations o Provided patient education about importance of blood sugar control o Collaborate with PCP to recommend increasing Januvia to 100mg d45m and adding Fiasp per sliding scale for meal time coverage. Contacted patient 08/06/19 with recommendations o Sample of Fiasp provided in office on 08/06/19.  Directions for sliding scale given o Collaborate with PCP staff to send in prescription for [employer] sensors to DME company (not covered at retail pharmacy)  Patient self care activities - Over the next 30 days, patient will: o Check blood sugar twice daily, document, and provide at future appointments o Contact provider with any episodes of hypoglycemia o Exercise 10-15 minutes daily 3 times weekly and work towards goal of 30 minutes 5 times weekly o Focus on a healthy, balanced diet using the PLATE method discussed o Increase Januvia to 100mg da23mo Start Fiasp insulin per sliding scale directions (below)  Diabetic Sliding Scale (I)  IF BLOOD SUGAR IS:  LESS THAN 100 ( NO INSULIN)  100-140 (2 UNITS)  141-180 (4 UNITS)  181-220 (6 UNITS)  221-260 (8 UNITS)  261-300 (10 UNITS)  301-340 (12 UNITS)  MORE THAN 341 UNITS (14 UNITS)  o Check blood sugar before breakfast, lunch, and dinner and then administer appropriate amount of insulin based on directions at the start of meal or within the first 20 minutes of starting meal  Medication management  Pharmacist Clinical Goal(s): o Over the next 90 days, patient will work with PharmD and providers to achieve optimal medication adherence  Current pharmacy: [facility]  Interventions  o Comprehensive medication review performed. o Continue current medication management strategy  Patient self care activities - Over the next 90 days, patient will: o Focus on medication adherence by using a pill box to organize medications o Take medications as prescribed o Report any questions or concerns to PharmD and/or provider(s)  Initial goal documentation        Diabetes   A1c goal <7%  Recent Relevant Labs: Lab Results  Component Value Date/Time   HGBA1C 10.4 (H) 07/30/2019 03:33 PM   HGBA1C 13.4 (H) 02/04/2019 04:01 PM   MICROALBUR 30 12/10/2018 05:09 PM   MICROALBUR 150 10/31/2017 12:47 PM   Kidney Function Lab  Results  Component Value Date/Time   CREATININE 0.59 07/30/2019 03:33 PM   CREATININE 0.70 02/04/2019 04:01 PM   GFRNONAA 92 07/30/2019 03:33 PM   GFRAA 106 07/30/2019 03:33 PM   K 3.5 07/30/2019 03:33 PM   K 4.6 02/04/2019 04:01 PM   Last diabetic Eye exam: No results found for: HMDIABEYEEXA  Last diabetic Foot exam: No results found for: HMDIABFOOTEX   Checking BG: Sometimes Daily  Recent FBG Readings: 362, 248, 230 Recent pre-meal BG readings:  Recent 2hr PP BG readings:   Recent HS BG readings:   Patient has failed these meds in past: Tresiba, Tradjenta, [place]  Patient is currently controlled on the following medications:  Basaglar Kwikpen 30 units at bedtime  Januvia 57m daily  We discussed:   Pt mentioned blood sugar spikes and drops and that she is not feeling well recently (tired)  Per M[employer]ana [employer]patient assistance, last shipment of Januvia was on 03/25/19. TDellia Cloud CMA, placed a refill request for today and patient should receive shipment at her home in 7-10 business days. 2 refills remain. Application expires 28/11/91 Pt stopped Ozempic due to significant weight loss (lost down to 100lbs, normally weighs 135lbs)  Pt is taking 1/2 tablet of Januvia 1032mdaily Diet extensively Cookout, pizza, eats out a lot (something quick) Only cooks only the weekends, which is when she has time Sometimes pt only eats one meal a day Recommend pt limit eating out and when she does eat out to make healthy choices (salad) Recommend PLATE method for meal planning a balanced diet Recommend pt limit carbohydrates Exercise extensively Not exercising right  Recommend pt get 30 minutes of moderate intensity exercise daily 5 times a week (150 minutes total per week). Okay to start slow (10-15 minutes 3 times weekly) and work up to exercise goal  Goal is to prevent complications from diabetes  Discussed the possibility of adding meal time insulin and/or increasing Januvia  dose with patient  Recommend pt start checking blood sugar before breakfast and dinner  Contacted patient 08/06/19 to notify her to start Fiasp per sliding scale after collaboration with PCP via in basket message (sample and instructions provided in office today). Also notified patient to increase to Januvia 10042maily. o Pt was instructed to check blood glucose three times daily (before breakfast, lunch, and dinner) and then administer the appropriate amount of Fiasp (based on sliding scale directions) at the start a meal or within 20 minutes of starting meal o Also collaborated with PCP staff to [place] in [place] [employer] [place] [facility] [employer] [facility] provide patient a sample today in office if needed.  Plan Continue current medications  Collaborate with PCP about increasing Januvia to 100m37mily and adding Fiasp for meal time coverage per sliding scale  Hyperlipidemia   LDL goal < 70  Lipid Panel     Component Value Date/Time   CHOL 257 (H) 07/30/2019 1533   TRIG 863 (HH) 07/30/2019 1533   HDL 34 (L) 07/30/2019 1533   LDLCALC Comment (A) 07/30/2019 1533    Hepatic Function Latest Ref Rng & Units 07/30/2019 02/04/2019 12/10/2018  Total Protein 6.0 - 8.5 g/dL 8.0 7.3 7.4  Albumin 3.7 - 4.7 g/dL 4.5 4.4 4.4  AST 0 - 40 IU/L _0 ALT 0 - 32 IU/L _1 Alk Phosphatase 48 - 121 IU/L 179(H) 171(H) 213(H)  Total Bilirubin 0.0 - 1.2 mg/dL 0.2 <0.2 <0.2    The 10-year ASCVD risk score Mikey Bussing DC Jr., et al., 2013) is: 41.6%   Values used to calculate the score:     Age: 37 years     Sex: Female     Is Non-Hispanic African American: No     Diabetic: Yes     Tobacco smoker: Yes     Systolic Blood Pressure: 742 mmHg     Is BP treated: Yes     HDL Cholesterol: 34 mg/dL     Total Cholesterol: 257 mg/dL   Patient has failed these meds in past: Rosuvastatin Patient is currently uncontrolled on the following medications:   Atorvastatin  31m daily  We discussed:    Pt said that she thought her cholesterol was pretty "balanced out"  Discussed current cholesterol values  Pt has been taking the atorvastatin 178mdaily, notified her that a prescription was sent for 8059maily and she needs to begin taking that one immediately Diet and exercise extensively Pt needs to limit fried foods, fast food, and junk food Discussed how important a healthy diet and exercise is  Discussed that uncontrolled diabetes could be contributing to cholesterol issues  If cholesterol does not improve, will need to consider additional medication to help with triglycerides   Plan Continue current medications  Recheck lipid panel at next appointment to determine if another medication is needed for additional triglyceride reduction  Hypertension   BP goal is:  <130/80  Office blood pressures are  BP Readings from Last 3 Encounters:  07/30/19 124/70  07/30/19 124/70  02/04/19 122/76   Patient checks BP at home 3-5x per week Patient home BP readings are ranging: 119/90  Patient has failed these meds in the past: HCTZ, telmisartan/HCTZ Patient is currently controlled on the following medications:   Amlodipine 5mg20mily  Telmisartan 20mg52mly  We discussed:  Pt had an issue with BP when she was working, but it has improved since she retired  Has dizziness occasionally, no headaches o When lightheaded and dizzy recommend check BP and BG  Plan Continue current medications   Osteopenia / Osteoporosis   Last DEXA Scan: Ordered   Plan Discuss at follow up  Health Maintenance   Patient is currently on the following medications:   Magnesium 30mg 19my  Potassium Chloride 10mEq 3my  We discussed:    Pt stated that she needs a refill for potassium  Plan Continue current medications  Collaborate with PCP staff to send in refill for potassium  Vaccines   Reviewed and discussed patient's vaccination history.     Immunization History  Administered Date(s) Administered   Janssen (J&J) SARS-COV-2 Vaccination 03/17/2019   Tdap 01/25/2013, 07/29/2013   Plan Discuss at follow up  Medication Management   [facility] Uses pill box? Discuss at  follow up  We discussed:   Importance of taking each medications daily as directed  Pt is unsure of what each medication she is taking is for  Plan Continue current medication management strategy  Discuss medication synchronization and adherence packaging at follow up Discuss compliance at follow up  Follow up:   1 week blood sugar check with Dellia Cloud, CMA  4 week phone visit with PharmD  Jannette Fogo, PharmD Clinical Pharmacist [facility] [phone]

## 2019-08-04 NOTE — Patient Instructions (Addendum)
Visit Information  Goals Addressed            This Visit's Progress   . Pharmacy Care Plan       CARE PLAN ENTRY (see longitudinal plan of care for additional care plan information)  Current Barriers:  . Chronic Disease Management support, education, and care coordination needs related to Hypertension, Hyperlipidemia, and Diabetes   Hypertension BP Readings from Last 3 Encounters:  07/30/19 124/70  07/30/19 124/70  02/04/19 122/76   . Pharmacist Clinical Goal(s): o Over the next 180 days, patient will work with PharmD and providers to maintain BP goal <130/80 . Current regimen:  . Amlodipine 5mg  daily . Telmisartan 20mg  daily . Interventions: o Provided dietary and exercise recommendations o Check blood pressure when light headed or dizzy . Patient self care activities - Over the next 180 days, patient will: o Check BP 3-5 times weekly, document, and provide at future appointments o Ensure daily salt intake < 2300 mg/day o Exercise 10-15 minutes daily 3 times weekly and work towards goal of 30 minutes 5 times weekly  Hyperlipidemia Lab Results  Component Value Date/Time   LDLCALC Comment (A) 07/30/2019 03:33 PM   . Pharmacist Clinical Goal(s): o Over the next 90 days, patient will work with PharmD and providers to achieve LDL goal < 70 . Current regimen:  o Atorvastatin 80mg  daily Interventions: o Provided dietary and exercise recommendations o Patient instructed to pick up new prescription for atorvastatin 80mg  daily from pharmacy and begin taking immediately (patient has been taking atorvastatin 10mg  daily) o Discussed cholesterol levels and provided patient education . Patient self care activities - Over the next 90 days, patient will: o Exercise 10-15 minutes daily 3 times weekly and work towards goal of 30 minutes 5 times weekly o Limit fried foods, fast food, and junk food o Focus on a healthy, balanced diet using the PLATE method discussed  Diabetes Lab  Results  Component Value Date/Time   HGBA1C 10.4 (H) 07/30/2019 03:33 PM   HGBA1C 13.4 (H) 02/04/2019 04:01 PM   . Pharmacist Clinical Goal(s): o Over the next 90 days, patient will work with PharmD and providers to achieve A1c goal <7% . Current regimen:  . Basaglar Kwikpen 30 units at bedtime . Januvia 50mg  daily . Interventions: o Provided dietary and exercise recommendations o Provided patient education about importance of blood sugar control o Collaborate with PCP to recommend increasing Januvia to 100mg  daily and adding Fiasp per sliding scale for meal time coverage. Contact patient with recommendations . Patient self care activities - Over the next 30 days, patient will: o Check blood sugar twice daily, document, and provide at future appointments o Contact provider with any episodes of hypoglycemia o Exercise 10-15 minutes daily 3 times weekly and work towards goal of 30 minutes 5 times weekly o Focus on a healthy, balanced diet using the PLATE method discussed o Adjust diabetes medication regimen if approved by PCP  Medication management . Pharmacist Clinical Goal(s): o Over the next 90 days, patient will work with PharmD and providers to achieve optimal medication adherence . Current pharmacy: [facility] . Interventions o Comprehensive medication review performed. o Continue current medication management strategy . Patient self care activities - Over the next 90 days, patient will: o Focus on medication adherence by using a pill box to organize medications o Take medications as prescribed o Report any questions or concerns to PharmD and/or provider(s)  Initial goal documentation  Gabriella Acosta was given information about Chronic Care Management services today including:  1. CCM service includes personalized support from designated clinical staff supervised by her physician, including individualized plan of care and coordination with other care providers 2. 24/7  contact phone numbers for assistance for urgent and routine care needs. 3. Standard insurance, coinsurance, copays and deductibles apply for chronic care management only during months in which we provide at least 20 minutes of these services. Most insurances cover these services at 100%, however patients may be responsible for any copay, coinsurance and/or deductible if applicable. This service may help you avoid the need for more expensive face-to-face services. 4. Only one practitioner may furnish and bill the service in a calendar month. 5. The patient may stop CCM services at any time (effective at the end of the month) by phone call to the office staff.  Patient agreed to services and verbal consent obtained.   The patient verbalized understanding of instructions provided today and agreed to receive a mailed copy of patient instruction and/or educational materials. Telephone follow up appointment with pharmacy team member scheduled for: 09/05/19 @ 1:30 PM  Beryle Flockourtney Audi Wettstein, PharmD Clinical Pharmacist [facility] [phone]   Diabetes Mellitus and Nutrition, Adult When you have diabetes (diabetes mellitus), it is very important to have healthy eating habits because your blood sugar (glucose) levels are greatly affected by what you eat and drink. Eating healthy foods in the appropriate amounts, at about the same times every day, can help you:  Control your blood glucose.  Lower your risk of heart disease.  Improve your blood pressure.  Reach or maintain a healthy weight. Every person with diabetes is different, and each person has different needs for a meal plan. Your health care provider may recommend that you work with a diet and nutrition specialist (dietitian) to make a meal plan that is best for you. Your meal plan may vary depending on factors such as:  The calories you need.  The medicines you take.  Your weight.  Your blood glucose, blood pressure,  and cholesterol levels.  Your activity level.  Other health conditions you have, such as heart or kidney disease. How do carbohydrates affect me? Carbohydrates, also called carbs, affect your blood glucose level more than any other type of food. Eating carbs naturally raises the amount of glucose in your blood. Carb counting is a method for keeping track of how many carbs you eat. Counting carbs is important to keep your blood glucose at a healthy level, especially if you use insulin or take certain oral diabetes medicines. It is important to know how many carbs you can safely have in each meal. This is different for every person. Your dietitian can help you calculate how many carbs you should have at each meal and for each snack. Foods that contain carbs include:  Bread, cereal, rice, pasta, and crackers.  Potatoes and corn.  Peas, beans, and lentils.  Milk and yogurt.  Fruit and juice.  Desserts, such as cakes, cookies, ice cream, and candy. How does alcohol affect me? Alcohol can cause a sudden decrease in blood glucose (hypoglycemia), especially if you use insulin or take certain oral diabetes medicines. Hypoglycemia can be a life-threatening condition. Symptoms of hypoglycemia (sleepiness, dizziness, and confusion) are similar to symptoms of having too much alcohol. If your health care provider says that alcohol is safe for you, follow these guidelines:  Limit alcohol intake to no more than 1 drink per day for  nonpregnant women and 2 drinks per day for men. One drink equals 12 oz of beer, 5 oz of wine, or 1 oz of hard liquor.  Do not drink on an empty stomach.  Keep yourself hydrated with water, diet soda, or unsweetened iced tea.  Keep in mind that regular soda, juice, and other mixers may contain a lot of sugar and must be counted as carbs. What are tips for following this plan?  Reading food labels  Start by checking the serving size on the "Nutrition Facts" label of  packaged foods and drinks. The amount of calories, carbs, fats, and other nutrients listed on the label is based on one serving of the item. Many items contain more than one serving per package.  Check the total grams (g) of carbs in one serving. You can calculate the number of servings of carbs in one serving by dividing the total carbs by 15. For example, if a food has 30 g of total carbs, it would be equal to 2 servings of carbs.  Check the number of grams (g) of saturated and trans fats in one serving. Choose foods that have low or no amount of these fats.  Check the number of milligrams (mg) of salt (sodium) in one serving. Most people should limit total sodium intake to less than 2,300 mg per day.  Always check the nutrition information of foods labeled as "low-fat" or "nonfat". These foods may be higher in added sugar or refined carbs and should be avoided.  Talk to your dietitian to identify your daily goals for nutrients listed on the label. Shopping  Avoid buying canned, premade, or processed foods. These foods tend to be high in fat, sodium, and added sugar.  Shop around the outside edge of the grocery store. This includes fresh fruits and vegetables, bulk grains, fresh meats, and fresh dairy. Cooking  Use low-heat cooking methods, such as baking, instead of high-heat cooking methods like deep frying.  Cook using healthy oils, such as olive, canola, or sunflower oil.  Avoid cooking with butter, cream, or high-fat meats. Meal planning  Eat meals and snacks regularly, preferably at the same times every day. Avoid going long periods of time without eating.  Eat foods high in fiber, such as fresh fruits, vegetables, beans, and whole grains. Talk to your dietitian about how many servings of carbs you can eat at each meal.  Eat 4-6 ounces (oz) of lean protein each day, such as lean meat, chicken, fish, eggs, or tofu. One oz of lean protein is equal to: ? 1 oz of meat, chicken, or  fish. ? 1 egg. ?  cup of tofu.  Eat some foods each day that contain healthy fats, such as avocado, nuts, seeds, and fish. Lifestyle  Check your blood glucose regularly.  Exercise regularly as told by your health care provider. This may include: ? 150 minutes of moderate-intensity or vigorous-intensity exercise each week. This could be brisk walking, biking, or water aerobics. ? Stretching and doing strength exercises, such as yoga or weightlifting, at least 2 times a week.  Take medicines as told by your health care provider.  Do not use any products that contain nicotine or tobacco, such as cigarettes and e-cigarettes. If you need help quitting, ask your health care provider.  Work with a Veterinary surgeon or diabetes educator to identify strategies to manage stress and any emotional and social challenges. Questions to ask a health care provider  Do I need to meet with a diabetes  educator?  Do I need to meet with a dietitian?  What number can I call if I have questions?  When are the best times to check my blood glucose? Where to find more information:  American Diabetes Association: diabetes.org  Academy of Nutrition and Dietetics: www.eatright.[employer] of Diabetes and Digestive and Kidney Diseases (NIH): CarFlippers.tn Summary  A healthy meal plan will help you control your blood glucose and maintain a healthy lifestyle.  Working with a diet and nutrition specialist (dietitian) can help you make a meal plan that is best for you.  Keep in mind that carbohydrates (carbs) and alcohol have immediate effects on your blood glucose levels. It is important to count carbs and to use alcohol carefully. This information is not intended to replace advice given to you by your health care provider. Make sure you discuss any questions you have with your health care provider. Document Revised: 12/08/2016 Document Reviewed: 01/31/2016 Elsevier Patient Education  2020  Elsevier Inc.  Dyslipidemia Dyslipidemia is an imbalance of waxy, fat-like substances (lipids) in the blood. The body needs lipids in small amounts. Dyslipidemia often involves a high level of cholesterol or triglycerides, which are types of lipids. Common forms of dyslipidemia include:  High levels of LDL cholesterol. LDL is the type of cholesterol that causes fatty deposits (plaques) to build up in the blood vessels that carry blood away from your heart (arteries).  Low levels of HDL cholesterol. HDL cholesterol is the type of cholesterol that protects against heart disease. High levels of HDL remove the LDL buildup from arteries.  High levels of triglycerides. Triglycerides are a fatty substance in the blood that is linked to a buildup of plaques in the arteries. What are the causes? Primary dyslipidemia is caused by changes (mutations) in genes that are passed down through families (inherited). These mutations cause several types of dyslipidemia. Secondary dyslipidemia is caused by lifestyle choices and diseases that lead to dyslipidemia, such as:  Eating a diet that is high in animal fat.  Not getting enough exercise.  Having diabetes, kidney disease, liver disease, or thyroid disease.  Drinking large amounts of alcohol.  Using certain medicines. What increases the risk? You are more likely to develop this condition if you are an older man or if you are a woman who has gone through menopause. Other risk factors include:  Having a family history of dyslipidemia.  Taking certain medicines, including birth control pills, steroids, some diuretics, and beta-blockers.  Smoking cigarettes.  Eating a high-fat diet.  Having certain medical conditions such as diabetes, polycystic ovary syndrome (PCOS), kidney disease, liver disease, or hypothyroidism.  Not exercising regularly.  Being overweight or obese with too much belly fat. What are the signs or symptoms? In most cases,  dyslipidemia does not usually cause any symptoms. In severe cases, very high lipid levels can cause:  Fatty bumps under the skin (xanthomas).  White or gray ring around the black center (pupil) of the eye. Very high triglyceride levels can cause inflammation of the pancreas (pancreatitis). How is this diagnosed? Your health care provider may diagnose dyslipidemia based on a routine blood test (fasting blood test). Because most people do not have symptoms of the condition, this blood testing (lipid profile) is done on adults age 22 and older and is repeated every 5 years. This test checks:  Total cholesterol. This measures the total amount of cholesterol in your blood, including LDL cholesterol, HDL cholesterol, and triglycerides. A healthy number is below 200.  LDL cholesterol. The target number for LDL cholesterol is different for each person, depending on individual risk factors. Ask your health care provider what your LDL cholesterol should be.  HDL cholesterol. An HDL level of 60 or higher is best because it helps to protect against heart disease. A number below 40 for men or below 50 for women increases the risk for heart disease.  Triglycerides. A healthy triglyceride number is below 150. If your lipid profile is abnormal, your health care provider may do other blood tests. How is this treated? Treatment depends on the type of dyslipidemia that you have and your other risk factors for heart disease and stroke. Your health care provider will have a target range for your lipid levels based on this information. For many people, this condition may be treated by lifestyle changes, such as diet and exercise. Your health care provider may recommend that you:  Get regular exercise.  Make changes to your diet.  Quit smoking if you smoke. If diet changes and exercise do not help you reach your goals, your health care provider may also prescribe medicine to lower lipids. The most commonly  prescribed type of medicine lowers your LDL cholesterol (statin drug). If you have a high triglyceride level, your provider may prescribe another type of drug (fibrate) or an omega-3 fish oil supplement, or both. Follow these instructions at home:  Eating and drinking  Follow instructions from your health care provider or dietitian about eating or drinking restrictions.  Eat a healthy diet as told by your health care provider. This can help you reach and maintain a healthy weight, lower your LDL cholesterol, and raise your HDL cholesterol. This may include: ? Limiting your calories, if you are overweight. ? Eating more fruits, vegetables, whole grains, fish, and lean meats. ? Limiting saturated fat, trans fat, and cholesterol.  If you drink alcohol: ? Limit how much you use. ? Be aware of how much alcohol is in your drink. In the [place] one drink equals one 12 oz bottle of beer (355 mL), one 5 oz glass of wine (148 mL), or one 1 oz glass of hard liquor (44 mL).  Do not drink alcohol if: ? Your health care provider tells you not to drink. ? You are pregnant, may be pregnant, or are planning to become pregnant. Activity  Get regular exercise. Start an exercise and strength training program as told by your health care provider. Ask your health care provider what activities are safe for you. Your health care provider may recommend: ? 30 minutes of aerobic activity 4-6 days a week. Brisk walking is an example of aerobic activity. ? Strength training 2 days a week. General instructions  Do not use any products that contain nicotine or tobacco, such as cigarettes, e-cigarettes, and chewing tobacco. If you need help quitting, ask your health care provider.  Take over-the-counter and prescription medicines only as told by your health care provider. This includes supplements.  Keep all follow-up visits as told by your health care provider. Contact a health care provider if:  You are: ? Having  trouble sticking to your exercise or diet plan. ? Struggling to quit smoking or control your use of alcohol. Summary  Dyslipidemia often involves a high level of cholesterol or triglycerides, which are types of lipids.  Treatment depends on the type of dyslipidemia that you have and your other risk factors for heart disease and stroke.  For many people, treatment starts with lifestyle changes, such as  diet and exercise.  Your health care provider may prescribe medicine to lower lipids. This information is not intended to replace advice given to you by your health care provider. Make sure you discuss any questions you have with your health care provider. Document Revised: 08/20/2017 Document Reviewed: 07/27/2017 Elsevier Patient Education  2020 [employer]. Visit Information  Goals Addressed            This Visit's Progress   . Pharmacy Care Plan       CARE PLAN ENTRY (see longitudinal plan of care for additional care plan information)  Current Barriers:  . Chronic Disease Management support, education, and care coordination needs related to Hypertension, Hyperlipidemia, and Diabetes   Hypertension BP Readings from Last 3 Encounters:  07/30/19 124/70  07/30/19 124/70  02/04/19 122/76   . Pharmacist Clinical Goal(s): o Over the next 180 days, patient will work with PharmD and providers to maintain BP goal <130/80 . Current regimen:  . Amlodipine  daily . Telmisartan  daily . Interventions: o Provided dietary and exercise recommendations o Check blood pressure when light headed or dizzy . Patient self care activities - Over the next 180 days, patient will: o Check BP 3-5 times weekly, document, and provide at future appointments o Ensure daily salt intake < 2300 mg/day o Exercise 10-15 minutes daily 3 times weekly and work towards goal of 30 minutes 5 times weekly  Hyperlipidemia Lab Results  Component Value Date/Time   LDLCALC Comment (A) 07/30/2019 03:33 PM    . Pharmacist Clinical Goal(s): o Over the next 90 days, patient will work with PharmD and providers to achieve LDL goal < 70 . Current regimen:  o Atorvastatin  daily Interventions: o Provided dietary and exercise recommendations o Patient instructed to pick up new prescription for atorvastatin  daily from pharmacy and begin taking immediately (patient has been taking atorvastatin  daily) o Discussed cholesterol levels and provided patient education . Patient self care activities - Over the next 90 days, patient will: o Exercise 10-15 minutes daily 3 times weekly and work towards goal of 30 minutes 5 times weekly o Limit fried foods, fast food, and junk food o Focus on a healthy, balanced diet using the PLATE method discussed  Diabetes Lab Results  Component Value Date/Time   HGBA1C 10.4 (H) 07/30/2019 03:33 PM   HGBA1C 13.4 (H) 02/04/2019 04:01 PM   . Pharmacist Clinical Goal(s): o Over the next 90 days, patient will work with PharmD and providers to achieve A1c goal <7% . Current regimen:  . Basaglar Kwikpen 30 units at bedtime . Januvia  daily . Interventions: o Provided dietary and exercise recommendations o Provided patient education about importance of blood sugar control o Collaborate with PCP to recommend increasing Januvia to  daily and adding Fiasp per sliding scale for meal time coverage. Contacted patient 08/06/19 with recommendations o Sample of Fiasp provided in office on 08/06/19. Directions for sliding scale given o Collaborate with PCP staff to send in prescription for FreeStyle Libre sensors to DME company (not covered at [employer]) . Patient self care activities - Over the next 30 days, patient will: o Check blood sugar twice daily, document, and provide at future appointments o Contact provider with any episodes of hypoglycemia o Exercise 10-15 minutes daily 3 times weekly and work towards goal of 30 minutes 5 times weekly o Focus on  a healthy, balanced diet using the PLATE method discussed o Increase Januvia to  daily o Start Fiasp insulin  per sliding scale directions (below)  Diabetic Sliding Scale (I)  IF BLOOD SUGAR IS:  LESS THAN 100 ( NO INSULIN)  100-140 (2 UNITS)  141-180 (4 UNITS)  181-220 (6 UNITS)  221-260 (8 UNITS)  261-300 (10 UNITS)  301-340 (12 UNITS)  MORE THAN 341 UNITS (14 UNITS)  o Check blood sugar before breakfast, lunch, and dinner and then administer appropriate amount of insulin based on directions at the start of meal or within the first 20 minutes of starting meal  Medication management . Pharmacist Clinical Goal(s): o Over the next 90 days, patient will work with PharmD and providers to achieve optimal medication adherence . Current pharmacy: [facility] . Interventions o Comprehensive medication review performed. o Continue current medication management strategy . Patient self care activities - Over the next 90 days, patient will: o Focus on medication adherence by using a pill box to organize medications o Take medications as prescribed o Report any questions or concerns to PharmD and/or provider(s)  Initial goal documentation        Gabriella Acosta was given information about Chronic Care Management services today including:  6. CCM service includes personalized support from designated clinical staff supervised by her physician, including individualized plan of care and coordination with other care providers 7. 24/7 contact phone numbers for assistance for urgent and routine care needs. 8. Standard insurance, coinsurance, copays and deductibles apply for chronic care management only during months in which we provide at least 20 minutes of these services. Most insurances cover these services at 100%, however patients may be responsible for any copay, coinsurance and/or deductible if applicable. This service may help you avoid the need for more expensive face-to-face  services. 9. Only one practitioner may furnish and bill the service in a calendar month. 10. The patient may stop CCM services at any time (effective at the end of the month) by phone call to the office staff.  Patient agreed to services and verbal consent obtained.   The patient verbalized understanding of instructions provided today and declined a print copy of patient instruction materials.  Telephone follow up appointment with pharmacy team member scheduled for: 09/05/19 @ 1:30 PM  Beryle Flock, PharmD Clinical Pharmacist [facility] [phone]   Diabetes Mellitus and Nutrition, Adult When you have diabetes (diabetes mellitus), it is very important to have healthy eating habits because your blood sugar (glucose) levels are greatly affected by what you eat and drink. Eating healthy foods in the appropriate amounts, at about the same times every day, can help you:  Control your blood glucose.  Lower your risk of heart disease.  Improve your blood pressure.  Reach or maintain a healthy weight. Every person with diabetes is different, and each person has different needs for a meal plan. Your health care provider may recommend that you work with a diet and nutrition specialist (dietitian) to make a meal plan that is best for you. Your meal plan may vary depending on factors such as:  The calories you need.  The medicines you take.  Your weight.  Your blood glucose, blood pressure, and cholesterol levels.  Your activity level.  Other health conditions you have, such as heart or kidney disease. How do carbohydrates affect me? Carbohydrates, also called carbs, affect your blood glucose level more than any other type of food. Eating carbs naturally raises the amount of glucose in your blood. Carb counting is a method for keeping track of how many carbs you eat. Counting carbs  is important to keep your blood glucose at a healthy level, especially if you  use insulin or take certain oral diabetes medicines. It is important to know how many carbs you can safely have in each meal. This is different for every person. Your dietitian can help you calculate how many carbs you should have at each meal and for each snack. Foods that contain carbs include:  Bread, cereal, rice, pasta, and crackers.  Potatoes and corn.  Peas, beans, and lentils.  Milk and yogurt.  Fruit and juice.  Desserts, such as cakes, cookies, ice cream, and candy. How does alcohol affect me? Alcohol can cause a sudden decrease in blood glucose (hypoglycemia), especially if you use insulin or take certain oral diabetes medicines. Hypoglycemia can be a life-threatening condition. Symptoms of hypoglycemia (sleepiness, dizziness, and confusion) are similar to symptoms of having too much alcohol. If your health care provider says that alcohol is safe for you, follow these guidelines:  Limit alcohol intake to no more than 1 drink per day for nonpregnant women and 2 drinks per day for men. One drink equals 12 oz of beer, 5 oz of wine, or 1 oz of hard liquor.  Do not drink on an empty stomach.  Keep yourself hydrated with water, diet soda, or unsweetened iced tea.  Keep in mind that regular soda, juice, and other mixers may contain a lot of sugar and must be counted as carbs. What are tips for following this plan?  Reading food labels  Start by checking the serving size on the "Nutrition Facts" label of packaged foods and drinks. The amount of calories, carbs, fats, and other nutrients listed on the label is based on one serving of the item. Many items contain more than one serving per package.  Check the total grams (g) of carbs in one serving. You can calculate the number of servings of carbs in one serving by dividing the total carbs by 15. For example, if a food has 30 g of total carbs, it would be equal to 2 servings of carbs.  Check the number of grams (g) of saturated  and trans fats in one serving. Choose foods that have low or no amount of these fats.  Check the number of milligrams (mg) of salt (sodium) in one serving. Most people should limit total sodium intake to less than 2,300 mg per day.  Always check the nutrition information of foods labeled as "low-fat" or "nonfat". These foods may be higher in added sugar or refined carbs and should be avoided.  Talk to your dietitian to identify your daily goals for nutrients listed on the label. Shopping  Avoid buying canned, premade, or processed foods. These foods tend to be high in fat, sodium, and added sugar.  Shop around the outside edge of the grocery store. This includes fresh fruits and vegetables, bulk grains, fresh meats, and fresh dairy. Cooking  Use low-heat cooking methods, such as baking, instead of high-heat cooking methods like deep frying.  Cook using healthy oils, such as olive, canola, or sunflower oil.  Avoid cooking with butter, cream, or high-fat meats. Meal planning  Eat meals and snacks regularly, preferably at the same times every day. Avoid going long periods of time without eating.  Eat foods high in fiber, such as fresh fruits, vegetables, beans, and whole grains. Talk to your dietitian about how many servings of carbs you can eat at each meal.  Eat 4-6 ounces (oz) of lean protein each day, such  as lean meat, chicken, fish, eggs, or tofu. One oz of lean protein is equal to: ? 1 oz of meat, chicken, or fish. ? 1 egg. ?  cup of tofu.  Eat some foods each day that contain healthy fats, such as avocado, nuts, seeds, and fish. Lifestyle  Check your blood glucose regularly.  Exercise regularly as told by your health care provider. This may include: ? 150 minutes of moderate-intensity or vigorous-intensity exercise each week. This could be brisk walking, biking, or water aerobics. ? Stretching and doing strength exercises, such as yoga or weightlifting, at least 2 times a  week.  Take medicines as told by your health care provider.  Do not use any products that contain nicotine or tobacco, such as cigarettes and e-cigarettes. If you need help quitting, ask your health care provider.  Work with a Veterinary surgeon or diabetes educator to identify strategies to manage stress and any emotional and social challenges. Questions to ask a health care provider  Do I need to meet with a diabetes educator?  Do I need to meet with a dietitian?  What number can I call if I have questions?  When are the best times to check my blood glucose? Where to find more information:  American Diabetes Association: diabetes.org  Academy of Nutrition and Dietetics: www.eatright.[employer] of Diabetes and Digestive and Kidney Diseases (NIH): CarFlippers.tn Summary  A healthy meal plan will help you control your blood glucose and maintain a healthy lifestyle.  Working with a diet and nutrition specialist (dietitian) can help you make a meal plan that is best for you.  Keep in mind that carbohydrates (carbs) and alcohol have immediate effects on your blood glucose levels. It is important to count carbs and to use alcohol carefully. This information is not intended to replace advice given to you by your health care provider. Make sure you discuss any questions you have with your health care provider. Document Revised: 12/08/2016 Document Reviewed: 01/31/2016 Elsevier Patient Education  2020 [employer].

## 2019-08-06 ENCOUNTER — Other Ambulatory Visit: Payer: Self-pay

## 2019-08-06 DIAGNOSIS — E1165 Type 2 diabetes mellitus with hyperglycemia: Secondary | ICD-10-CM

## 2019-08-06 LAB — SPECIMEN STATUS REPORT

## 2019-08-06 LAB — GAMMA GT: GGT: 54 IU/L (ref 0–60)

## 2019-08-06 MED ORDER — SITAGLIPTIN PHOSPHATE 100 MG PO TABS
100.0000 mg | ORAL_TABLET | Freq: Every day | ORAL | 0 refills | Status: DC
Start: 2019-08-06 — End: 2019-09-22

## 2019-08-18 ENCOUNTER — Telehealth: Payer: Self-pay

## 2019-08-19 ENCOUNTER — Other Ambulatory Visit: Payer: Self-pay

## 2019-08-19 MED ORDER — FREESTYLE LIBRE 2 SENSOR MISC: 5 refills | Status: DC

## 2019-08-21 ENCOUNTER — Telehealth: Payer: Self-pay

## 2019-08-21 NOTE — Telephone Encounter (Signed)
Attempted to call pt to notify her that her forms have been completed and she can pick them up. YL,RMA

## 2019-08-22 ENCOUNTER — Other Ambulatory Visit: Payer: Self-pay | Admitting: Nurse Practitioner

## 2019-08-26 ENCOUNTER — Other Ambulatory Visit: Payer: Self-pay

## 2019-08-26 ENCOUNTER — Ambulatory Visit (INDEPENDENT_AMBULATORY_CARE_PROVIDER_SITE_OTHER): Payer: Medicare PPO

## 2019-08-26 ENCOUNTER — Telehealth: Payer: Medicare PPO

## 2019-08-26 DIAGNOSIS — E782 Mixed hyperlipidemia: Secondary | ICD-10-CM

## 2019-08-26 DIAGNOSIS — E1165 Type 2 diabetes mellitus with hyperglycemia: Secondary | ICD-10-CM

## 2019-08-26 DIAGNOSIS — I10 Essential (primary) hypertension: Secondary | ICD-10-CM

## 2019-08-29 NOTE — Chronic Care Management (AMB) (Signed)
Chronic Care Management   Follow Up Note   08/26/2019 Name: Gabriella Acosta MRN: [id number] DOB: 1945-12-25  Referred by: Gabriella Felts, FNP Reason for referral : Chronic Care Management (RQ Initial RN CM Call )   Gabriella Acosta is a 74 y.o. year old female who is a primary care patient of Gabriella Felts, FNP. The CCM team was consulted for assistance with chronic disease management and care coordination needs.    Review of patient status, including review of consultants reports, relevant laboratory and other test results, and collaboration with appropriate care team members and the patient's provider was performed as part of comprehensive patient evaluation and provision of chronic care management services.    SDOH (Social Determinants of Health) assessments performed: Yes - No acute challenges identified  See Care Plan activities for detailed interventions related to SDOH)    Placed initial outbound CCM RN CM call to patient to assess for CCM needs, a care plan was established.    Outpatient Encounter Medications as of 08/26/2019  Medication Sig Note  . amLODipine (NORVASC) 5 MG tablet TAKE 1 TABLET(5 MG) BY MOUTH DAILY   . atorvastatin (LIPITOR) 80 MG tablet Take 1 tablet (80 mg total) by mouth daily.   . Blood Glucose Monitoring Suppl DEVI Check blood sugars twice daily. E11.9   . Continuous Blood Gluc Sensor (FREESTYLE LIBRE 2 SENSOR) MISC Use to check blood sugars daily   . diclofenac sodium (VOLTAREN) 1 % GEL Apply 2 g topically 4 (four) times daily. (Patient not taking: Reported on 12/10/2018)   . fluconazole (DIFLUCAN) 100 MG tablet Take 1 tablet by mouth today then repeat in 5 days.   [name] glucose blood (ACCU-CHEK GUIDE) test strip Use as instructed   . Insulin Glargine (BASAGLAR KWIKPEN) 100 UNIT/ML Inject 30 Units into the skin at bedtime. 03/31/2019: [employer] patient assistance  . Insulin Pen Needle (PEN NEEDLES) 32G X 4 MM MISC 1 Stick by Does not apply route daily.   .  Lancets (ACCU-CHEK SOFT TOUCH) lancets Use as instructed   . magnesium 30 MG tablet Take 30 mg by mouth once a week.   . meloxicam (MOBIC) 15 MG tablet TK 1 T PO QD AS NEEDED WITH FOOD (Patient not taking: Reported on 08/04/2019)   . potassium chloride (KLOR-CON 10) 10 MEQ tablet Take 1 tablet (10 mEq total) by mouth daily.   . sitaGLIPtin (JANUVIA) 100 MG tablet Take 1 tablet (100 mg total) by mouth daily.   [name] telmisartan (MICARDIS) 20 MG tablet TAKE 1 TABLET(20 MG) BY MOUTH DAILY    No facility-administered encounter medications on file as of 08/26/2019.     Objective:  Lab Results  Component Value Date   HGBA1C 10.4 (H) 07/30/2019   HGBA1C 13.4 (H) 02/04/2019   HGBA1C 13.7 (H) 01/15/2019   Lab Results  Component Value Date   MICROALBUR 30 12/10/2018   LDLCALC Comment (A) 07/30/2019   CREATININE 0.59 07/30/2019   BP Readings from Last 3 Encounters:  07/30/19 124/70  07/30/19 124/70  02/04/19 122/76    Goals Addressed      Patient Stated   .  "to get my diabetes under better control" (pt-stated)   Not on track     CARE PLAN ENTRY (see longitudinal plan of care for additional care plan information)  Current Barriers:  [name] Knowledge Deficits related to disease process and Self Health Management of Diabetes . Chronic Disease Management support and education needs related to type 2 Diabetes, HTN,  Hyperlipidemia  Nurse Case Manager Clinical Goal(s):  [name] Over the next 90 days, patient will work with CCM team and PCP to address needs related to disease education and support for improved Self Health management of DM  CCM RN CM Interventions:  . Inter-disciplinary care team collaboration (see longitudinal plan of care) . Evaluation of current treatment plan related to Diabetes and patient's adherence to plan as established by provider. . Provided education to patient re: current A1c 10.4%; Educated on target A1c <7.0; Educated on dietary and exercise recommendations; Educated on  daily glycemic control FBS 80-130; <180 after meals; Educated on 15'15' rule  . Reviewed medications with patient and discussed patient is adhering to taking her prescribed medications w/o noted SE or missed doses:  Basaglar Kwikpen 30 units at bedtime  Januvia 50mg  daily . Provided patient with printed educational materials related to Diabetes Management using Meal Planning; Diabetes Care Schedule; Preventing Complications with Diabetes; Grocery Shopping with Diabetes  . Advised patient, providing education and rationale, to check cbg 1-2 daily before meals and record, calling the CCM team and PCP for findings outside established parameters . Discussed plans with patient for ongoing care management follow up and provided patient with direct contact information for care management team  Patient Self Care Activities:  . Self administers medications as prescribed . Attends all scheduled provider appointments . Calls pharmacy for medication refills . Calls provider office for new concerns or questions  Initial goal documentation     .  "to get my triglycerides down to normal range" (pt-stated)   Not on track     CARE PLAN ENTRY (see longitudinal plan of care for additional care plan information)  Current Barriers:  Knowledge Deficits related to disease process and Self Health Management of Hyperlipidemia  . Chronic Disease Management support and education needs related to type 2 DM, HTN, Hyperlipidemia   Nurse Case Manager Clinical Goal(s):  [name] Over the next 90 days, patient will work with the CCM team and PCP to address needs related to disease education and support to help improve Self Health management of Hyperlipidemia   CCM RN CM Interventions:  08/26/19 call completed with patient . Inter-disciplinary care team collaboration (see longitudinal plan of care) . Evaluation of current treatment plan related to Hyperlipidemia and patient's adherence to plan as established by  provider. . Provided education to patient re: basic disease process; Educated on dietary and exercise recommendations; stressed importance of avoiding fried, fatty foods  . Reviewed medications with patient and discussed patient is adhering to the increased dosage of Atorvastatin 80 mg qd . Discussed plans with patient for ongoing care management follow up and provided patient with direct contact information for care management team . Provided patient with printed educational materials related to Triglycerides: Why They Matter   Patient Self Care Activities:  . Self administers medications as prescribed . Attends all scheduled provider appointments . Calls pharmacy for medication refills . Calls provider office for new concerns or questions  Initial goal documentation     .  COMPLETED: I would like to control my diabetes and chronic conditions (pt-stated)        Current Barriers:  . Diabetes: T2DM; most recent A1c 13.7% on 01/15/2019 was 10.6% on 10/02/18  . Current antihyperglycemic regimen: Tresiba 30 units qHS (patient was taking 25u, FBGs still 200), Tradjenta-->Januvia 100mg  o Samples provided for patient (Januvia 100mg ) on 03/31/19 o Lilly patient assistance ) submitted/approved  o Will apply for 04/02/19) assistance  since able to obtain (pt mailed back letter of attestation without application so unable to process; incoming pharmacy team to take over) . Denies hypoglycemic symptoms; denies hyperglycemic symptoms . Current meal patterns: will discuss . Current exercise: walking . Current blood glucose readings: FBG 180-200--Increased insulin to 30 (patient has not increased--encouraged her to increase insulin to 30 units qHS) . Cardiovascular risk reduction: o Current hypertensive regimen: telmisartan, amlodipine o Current hyperlipidemia regimen: rosuvastatin (not taking due to cost)-->switched patient to atorvastatin  Pharmacist Clinical Goal(s):  [name] Over the next 90  days, patient with work with PharmD and primary care provider to address needs related to optimization of medication management of chronic conditions (diabetes)  Interventions: . Comprehensive medication review performed, medication list updated in electronic medical record . Discussed the importance of diet & exercise . Application for medicaid completed-->patient denied, will move forward with PAP once letter received from patient   Patient Self Care Activities:  . Patient will check blood glucose daily, document, and provide at future appointments . Patient will focus on medication adherence by continuing to take medications as prescribed . Patient will take medications as prescribed . Patient will contact provider with any episodes of hypoglycemia . Patient will report any questions or concerns to provider   Please see past updates related to this goal by clicking on the "Past Updates" button in the selected goal        Plan:   Telephone follow up appointment with care management team member scheduled for:  10/08/19  Delsa Sale, RN, BSN, CCM Care Management Coordinator [facility]  Direct Phone: [phone]

## 2019-08-29 NOTE — Patient Instructions (Addendum)
Visit Information  Goals Addressed              This Visit's Progress     Patient Stated   .  "to get my diabetes under better control" (pt-stated)   Not on track     CARE PLAN ENTRY (see longitudinal plan of care for additional care plan information)  Current Barriers:  [name] Knowledge Deficits related to disease process and Self Health Management of Diabetes . Chronic Disease Management support and education needs related to type 2 Diabetes, HTN, Hyperlipidemia  Nurse Case Manager Clinical Goal(s):  [name] Over the next 90 days, patient will work with CCM team and PCP to address needs related to disease education and support for improved Self Health management of DM  CCM RN CM Interventions:  . Inter-disciplinary care team collaboration (see longitudinal plan of care) . Evaluation of current treatment plan related to Diabetes and patient's adherence to plan as established by provider. . Provided education to patient re: current A1c 10.4%; Educated on target A1c <7.0; Educated on dietary and exercise recommendations; Educated on daily glycemic control FBS 80-130; <180 after meals; Educated on 15'15' rule  . Reviewed medications with patient and discussed patient is adhering to taking her prescribed medications w/o noted SE or missed doses:  Basaglar Kwikpen 30 units at bedtime  Januvia 50mg  daily . Provided patient with printed educational materials related to Diabetes Management using Meal Planning; Diabetes Care Schedule; Preventing Complications with Diabetes; Grocery Shopping with Diabetes  . Advised patient, providing education and rationale, to check cbg 1-2 daily before meals and record, calling the CCM team and PCP for findings outside established parameters . Discussed plans with patient for ongoing care management follow up and provided patient with direct contact information for care management team  Patient Self Care Activities:  . Self administers medications as  prescribed . Attends all scheduled provider appointments . Calls pharmacy for medication refills . Calls provider office for new concerns or questions  Initial goal documentation     .  "to get my triglycerides down to normal range" (pt-stated)   Not on track     CARE PLAN ENTRY (see longitudinal plan of care for additional care plan information)  Current Barriers:  Knowledge Deficits related to disease process and Self Health Management of Hyperlipidemia  . Chronic Disease Management support and education needs related to type 2 DM, HTN, Hyperlipidemia   Nurse Case Manager Clinical Goal(s):  [name] Over the next 90 days, patient will work with the CCM team and PCP to address needs related to disease education and support to help improve Self Health management of Hyperlipidemia   CCM RN CM Interventions:  08/26/19 call completed with patient . Inter-disciplinary care team collaboration (see longitudinal plan of care) . Evaluation of current treatment plan related to Hyperlipidemia and patient's adherence to plan as established by provider. . Provided education to patient re: basic disease process; Educated on dietary and exercise recommendations; stressed importance of avoiding fried, fatty foods  . Reviewed medications with patient and discussed patient is adhering to the increased dosage of Atorvastatin 80 mg qd . Discussed plans with patient for ongoing care management follow up and provided patient with direct contact information for care management team . Provided patient with printed educational materials related to Triglycerides: Why They Matter   Patient Self Care Activities:  . Self administers medications as prescribed . Attends all scheduled provider appointments . Calls pharmacy for medication refills . Calls provider office  for new concerns or questions  Initial goal documentation     .  COMPLETED: I would like to control my diabetes and chronic conditions (pt-stated)         Current Barriers:  . Diabetes: T2DM; most recent A1c 13.7% on 01/15/2019 was 10.6% on 10/02/18  . Current antihyperglycemic regimen: Tresiba 30 units qHS (patient was taking 25u, FBGs still 200), Tradjenta-->Januvia 100mg  o Samples provided for patient (Januvia 100mg ) on 03/31/19 o [employer] patient assistance ) submitted/approved  o Will apply for [employer] 04/02/19) assistance since able to obtain (pt mailed back letter of attestation without application so unable to process; incoming pharmacy team to take over) . Denies hypoglycemic symptoms; denies hyperglycemic symptoms . Current meal patterns: will discuss . Current exercise: walking . Current blood glucose readings: FBG 180-200--Increased insulin to 30 (patient has not increased--encouraged her to increase insulin to 30 units qHS) . Cardiovascular risk reduction: o Current hypertensive regimen: telmisartan, amlodipine o Current hyperlipidemia regimen: rosuvastatin (not taking due to cost)-->switched patient to atorvastatin  Pharmacist Clinical Goal(s):  Psychologist, sport and exercise Over the next 90 days, patient with work with PharmD and primary care provider to address needs related to optimization of medication management of chronic conditions (diabetes)  Interventions: . Comprehensive medication review performed, medication list updated in electronic medical record . Discussed the importance of diet & exercise . Application for medicaid completed-->patient denied, will move forward with PAP once letter received from patient   Patient Self Care Activities:  . Patient will check blood glucose daily, document, and provide at future appointments . Patient will focus on medication adherence by continuing to take medications as prescribed . Patient will take medications as prescribed . Patient will contact provider with any episodes of hypoglycemia . Patient will report any questions or concerns to provider   Please see past updates related to this goal by  clicking on the "Past Updates" button in the selected goal        Patient verbalizes understanding of instructions provided today.   Telephone follow up appointment with care management team member scheduled for: 10/08/19  [name], RN, BSN, CCM Care Management Coordinator [facility]  Direct Phone: [phone]

## 2019-09-02 ENCOUNTER — Encounter: Payer: Self-pay | Admitting: Nurse Practitioner

## 2019-09-02 DIAGNOSIS — H5231 Anisometropia: Secondary | ICD-10-CM | POA: Diagnosis not present

## 2019-09-02 DIAGNOSIS — H16423 Pannus (corneal), bilateral: Secondary | ICD-10-CM | POA: Diagnosis not present

## 2019-09-02 DIAGNOSIS — H2513 Age-related nuclear cataract, bilateral: Secondary | ICD-10-CM | POA: Diagnosis not present

## 2019-09-02 DIAGNOSIS — H0288B Meibomian gland dysfunction left eye, upper and lower eyelids: Secondary | ICD-10-CM | POA: Diagnosis not present

## 2019-09-02 DIAGNOSIS — E119 Type 2 diabetes mellitus without complications: Secondary | ICD-10-CM | POA: Diagnosis not present

## 2019-09-02 DIAGNOSIS — H16143 Punctate keratitis, bilateral: Secondary | ICD-10-CM | POA: Diagnosis not present

## 2019-09-02 DIAGNOSIS — H0288A Meibomian gland dysfunction right eye, upper and lower eyelids: Secondary | ICD-10-CM | POA: Diagnosis not present

## 2019-09-02 DIAGNOSIS — H527 Unspecified disorder of refraction: Secondary | ICD-10-CM | POA: Diagnosis not present

## 2019-09-02 DIAGNOSIS — H35033 Hypertensive retinopathy, bilateral: Secondary | ICD-10-CM | POA: Diagnosis not present

## 2019-09-02 LAB — HM DIABETES EYE EXAM

## 2019-09-05 ENCOUNTER — Other Ambulatory Visit: Payer: Self-pay

## 2019-09-05 ENCOUNTER — Ambulatory Visit: Payer: Self-pay

## 2019-09-05 DIAGNOSIS — E782 Mixed hyperlipidemia: Secondary | ICD-10-CM

## 2019-09-05 DIAGNOSIS — E1165 Type 2 diabetes mellitus with hyperglycemia: Secondary | ICD-10-CM

## 2019-09-05 DIAGNOSIS — I10 Essential (primary) hypertension: Secondary | ICD-10-CM | POA: Diagnosis not present

## 2019-09-05 NOTE — Chronic Care Management (AMB) (Signed)
Chronic Care Management Pharmacy  Name: Gabriella Acosta  MRN: [id number] DOB: 09/20/1945  Chief Complaint/ HPI  Gabriella Acosta,  74 y.o. , female presents for their Follow-Up CCM visit with the clinical pharmacist via telephone due to COVID-19 Pandemic.  PCP : [name], FNP  Their chronic conditions include: Hypertension, Hyperlipidemia, Diabetes and Anxiety  Office Visits: 07/30/19 AWV and OV: HTN/DM follow up. Does not check BG at home. Refused colonoscopy and Cologuard. Interested in pneumonia vaccine. Referred to Ophthalmology. Labs ordered (HgbA1c, CMP14+EGFR, CBC, TB, lipid panel). Encouraged exercise. Continue current meds. Given prescription for PCV13. Atorvastatin increased from 19m daily to 832mdaily.   Consult Visits:  CCM Encounters: 03/27/19 PharmD: Samples of Januvia provided on 3/22. Attestation form mailed back without application so unable to process, incoming pharmacy team to take over. Rosuvastatin switched to atorvastatin due to cost.   02/18/19 CPhT: Confirmed [employer] received patient application for Januvia patient assistance. Waiting on attestation form that was mailed to patient on 02/03/19. Per PharmD, pt has mailed attestation form back to [facility] in 10-14 days  02/17/19 PharmD: Medicaid assistance denied. Assisted patient with PAP applications for Januvia and Basaglar.   02/11/19 CPhT: Basaglar patient assistance approved through [employer] 01/09/20  Medications: Outpatient Encounter Medications as of 09/05/2019  Medication Sig Note  . amLODipine (NORVASC) 5 MG tablet TAKE 1 TABLET(5 MG) BY MOUTH DAILY   . atorvastatin (LIPITOR) 80 MG tablet Take 1 tablet (80 mg total) by mouth daily.   . Blood Glucose Monitoring Suppl DEVI Check blood sugars twice daily. E11.9   . Continuous Blood Gluc Sensor (FREESTYLE LIBRE 2 SENSOR) MISC Use to check blood sugars daily   . glucose blood (ACCU-CHEK GUIDE) test strip Use as instructed   . insulin aspart  (FIASP FLEXTOUCH) 100 UNIT/ML FlexTouch Pen Inject into the skin. Use as directed per sliding scale three times daily   . Insulin Glargine (BASAGLAR KWIKPEN) 100 UNIT/ML Inject 30 Units into the skin at bedtime. 03/31/2019: [employer] patient assistance  . Insulin Pen Needle (PEN NEEDLES) 32G X 4 MM MISC 1 Stick by Does not apply route daily.   . Lancets (ACCU-CHEK SOFT TOUCH) lancets Use as instructed   . magnesium 30 MG tablet Take 30 mg by mouth once a week.   . potassium chloride (KLOR-CON 10) 10 MEQ tablet Take 1 tablet (10 mEq total) by mouth daily.   . sitaGLIPtin (JANUVIA) 100 MG tablet Take 1 tablet (100 mg total) by mouth daily.   . Marland Kitchenelmisartan (MICARDIS) 20 MG tablet TAKE 1 TABLET(20 MG) BY MOUTH DAILY   . diclofenac sodium (VOLTAREN) 1 % GEL Apply 2 g topically 4 (four) times daily. (Patient not taking: Reported on 12/10/2018)   . fluconazole (DIFLUCAN) 100 MG tablet Take 1 tablet by mouth today then repeat in 5 days.   . meloxicam (MOBIC) 15 MG tablet TK 1 T PO QD AS NEEDED WITH FOOD (Patient not taking: Reported on 08/04/2019)    No facility-administered encounter medications on file as of 09/05/2019.    Current Diagnosis/Assessment:  SDOH Interventions     Most Recent Value  SDOH Interventions  Financial Strain Interventions Other (Comment)  [Will assist patient with patient assistance for Fiasp if needed]      Goals Addressed            This Visit's Progress   . Pharmacy Care Plan       CARE PLAN ENTRY (see longitudinal plan of care for  additional care plan information)  Current Barriers:  . Chronic Disease Management support, education, and care coordination needs related to Hypertension, Hyperlipidemia, and Diabetes   Hypertension BP Readings from Last 3 Encounters:  07/30/19 124/70  07/30/19 124/70  02/04/19 122/76   . Pharmacist Clinical Goal(s): o Over the next 180 days, patient will work with PharmD and providers to maintain BP goal <130/80 . Current regimen:   . Amlodipine $RemoveBefo'5mg'PdnjofHWRTg$  daily . Telmisartan $RemoveBefor'20mg'HbgAQfrdHbWL$  daily . Interventions: o Provided dietary and exercise recommendations o Check blood pressure when light headed or dizzy . Patient self care activities - Over the next 180 days, patient will: o Check BP 3-5 times weekly, document, and provide at future appointments o Ensure daily salt intake < 2300 mg/day o Exercise for 30 minutes daily, 5 times weekly  Hyperlipidemia Lab Results  Component Value Date/Time   LDLCALC Comment (A) 07/30/2019 03:33 PM   . Pharmacist Clinical Goal(s): o Over the next 90 days, patient will work with PharmD and providers to achieve LDL goal < 70 . Current regimen:  o Atorvastatin $RemoveBefore'80mg'bQFpqokaBVTSI$  daily Interventions: o Provided dietary and exercise recommendations o Discussed cholesterol levels and provided patient education . Patient self care activities - Over the next 90 days, patient will: o Exercise for 30 minutes daily, 5 times weekly o Limit fried foods, fast food, and junk food o Focus on a healthy, balanced diet using the PLATE method discussed  Diabetes Lab Results  Component Value Date/Time   HGBA1C 10.4 (H) 07/30/2019 03:33 PM   HGBA1C 13.4 (H) 02/04/2019 04:01 PM   . Pharmacist Clinical Goal(s): o Over the next 90 days, patient will work with PharmD and providers to achieve A1c goal <7% . Current regimen:  . Basaglar Kwikpen 30 units at bedtime . Januvia $RemoveB'100mg'TFhScLrt$  daily . Fiasp three times daily per sliding scale . Interventions: o Provided dietary and exercise recommendations o Provided patient education about importance of blood sugar control o Januvia increased to $RemoveBefo'100mg'yitdHjgKtdB$  and Fiasp sliding scale added after last CCM visit with PharmD. Samples of Fiasp provided in office and directions provided o [employer] sensors denied at [employer]. Send prescription to [facility] after addition of Fiasp to medication list . Patient self care activities - Over the next 30 days, patient will: o Check blood sugar  twice daily, document, and provide at future appointments o Contact provider with any episodes of hypoglycemia o Exercise 10-15 minutes daily 3 times weekly and work towards goal of 30 minutes 5 times weekly o Focus on a healthy, balanced diet using the PLATE method discussed o Continue Januvia $RemoveBeforeDE'100mg'tlFBTznVrSubYtH$  daily o Resume Fiasp insulin per sliding scale directions (below)   Diabetic Sliding Scale (I)  IF BLOOD SUGAR IS:  LESS THAN 100 ( NO INSULIN)  100-140 (2 UNITS)  141-180 (4 UNITS)  181-220 (6 UNITS)  221-260 (8 UNITS)  261-300 (10 UNITS)  301-340 (12 UNITS)  MORE THAN 341 UNITS (14 UNITS)  o Check blood sugar before breakfast, lunch, and dinner and then administer appropriate amount of insulin based on directions at the start of meal or within the first 20 minutes of starting meal o Recommend patient use regular blood sugar meter until new FreeStyle Libre sensors can be obtained  Medication management . Pharmacist Clinical Goal(s): o Over the next 90 days, patient will work with PharmD and providers to achieve optimal medication adherence . Current pharmacy: [facility] . Interventions o Comprehensive medication review performed. o Utilize [facility] for medication synchronization, packaging and  delivery . Patient self care activities - Over the next 90 days, patient will: o Focus on medication adherence by using a pill box to organize medications o Take medications as prescribed o Report any questions or concerns to PharmD and/or provider(s)  Please see past updates related to this goal by clicking on the "Past Updates" button in the selected goal         Diabetes   A1c goal <7%  Recent Relevant Labs: Lab Results  Component Value Date/Time   HGBA1C 10.4 (H) 07/30/2019 03:33 PM   HGBA1C 13.4 (H) 02/04/2019 04:01 PM   MICROALBUR 30 12/10/2018 05:09 PM   MICROALBUR 150 10/31/2017 12:47 PM   Kidney Function Lab Results  Component Value Date/Time    CREATININE 0.59 07/30/2019 03:33 PM   CREATININE 0.70 02/04/2019 04:01 PM   GFRNONAA 92 07/30/2019 03:33 PM   GFRAA 106 07/30/2019 03:33 PM   K 3.5 07/30/2019 03:33 PM   K 4.6 02/04/2019 04:01 PM   Last diabetic Eye exam:  Lab Results  Component Value Date/Time   HMDIABEYEEXA No Retinopathy 09/02/2019 12:00 AM    Last diabetic Foot exam: 12/10/18  Checking BG: Sometimes Daily  Recent FBG Readings:  Recent pre-meal BG readings:  Recent 2hr PP BG readings:   Recent HS BG readings:   Patient has failed these meds in past: Tresiba, Tradjenta, [place]  Patient is currently controlled on the following medications: . Basaglar Kwikpen 30 units at bedtime . Januvia 166m daily . Fiasp sliding scale  We discussed:  . Diet extensively o Pt does not have much of an appetite, but says she has been forcing herself to eat some o Kale salad, salmon, chicken . Exercise extensively o Joined a gym and has been working out 3 days a week for 30-45 minutes each time o Congratulated pt on exercising! . Pt had not been checking BG for the past 2 weeks, needs new FreeStyle Libre sensors o Advised pt that Rx was sent in for sensors to [facility] She is going to call [facility] that we could send prescription for LFultonsensors if she is unable to obtain them [facility] stated that [facility] said pt's insurance would not cover [place] likely denied because pt's FClaiborne Billingshas not been added to medication list. Will add to medication list to show that pt meets criteria (insulin administered 3 times daily) o Collaboration with CMA, TDellia Cloud [facility] . Pt increased to Januvia 1048msince last office visit . Pt has not been using Fiasp because she has not been checking BG recently . Advised pt to go back to using regular blood glucose meter until new sensors can be  obtained  Plan Continue current medications   Hyperlipidemia   LDL goal < 70  Lipid Panel     Component Value Date/Time   CHOL 257 (H) 07/30/2019 1533   TRIG 863 (HH) 07/30/2019 1533   HDL 34 (L) 07/30/2019 1533   LDLCALC Comment (A) 07/30/2019 1533    Hepatic Function Latest Ref Rng & Units 07/30/2019 02/04/2019 12/10/2018  Total Protein 6.0 - 8.5 g/dL 8.0 7.3 7.4  Albumin 3.7 - 4.7 g/dL 4.5 4.4 4.4  AST 0 - 40 IU/L _0 ALT 0 - 32 IU/L _1 Alk Phosphatase 48 - 121 IU/L 179(H) 171(H) 213(H)  Total Bilirubin 0.0 - 1.2  mg/dL 0.2 <0.2 <0.2    The 10-year ASCVD risk score Mikey Bussing DC Jr., et al., 2013) is: 41.6%   Values used to calculate the score:     Age: 46 years     Sex: Female     Is Non-Hispanic African American: No     Diabetic: Yes     Tobacco smoker: Yes     Systolic Blood Pressure: 250 mmHg     Is BP treated: Yes     HDL Cholesterol: 34 mg/dL     Total Cholesterol: 257 mg/dL   Patient has failed these meds in past: Rosuvastatin Patient is currently uncontrolled on the following medications:  . Atorvastatin 61m daily   Plan Continue current medications  Recheck lipid panel at next appointment to determine if another medication is needed for additional triglyceride reduction  Hypertension   BP goal is:  <130/80  Office blood pressures are  BP Readings from Last 3 Encounters:  07/30/19 124/70  07/30/19 124/70  02/04/19 122/76   Patient checks BP at home 3-5x per week Patient home BP readings are ranging: 129/?  Patient has failed these meds in the past: HCTZ, telmisartan/HCTZ Patient is currently controlled on the following medications:  . Amlodipine 540mdaily . Telmisartan 2055maily  We discussed: o Pt says that BP has stabilized and has been doing well o Denies dizziness and headaches  Plan Continue current medications   Osteopenia / Osteoporosis   Last DEXA Scan: Ordered   Plan Collaborate with PCP about scheduling DEXA  scan  Health Maintenance   Patient is currently on the following medications:  . MMarland Kitchengnesium 18m6mily . Potassium Chloride 10mE87mily  Plan Continue current medications   Vaccines   Reviewed and discussed patient's vaccination history.    Immunization History  Administered Date(s) Administered  . Janssen (J&J) SARS-COV-2 Vaccination 03/17/2019  . Tdap 01/25/2013, 07/29/2013   We discussed:  Pt has not had Shingrix vaccine  Pneumonia  Plan Recommend Pneumovax vaccine in office at physical in December  Medication Management   [facility] Uses pill box? No - Does not have trouble remembering to take her medicines Pt endorses 100% compliance  We discussed:  . Importance of taking each medications daily as directed  Medication synchronization, adherence packaging, and delivery available with [facility]  Instructed [facility] to call [facility] and asked to be removed [facility] for medication synchronization, packaging and delivery   Verbal consent obtained for [facility] enhanced pharmacy services (medication synchronization, adherence packaging, delivery coordination). A medication sync plan was created to allow patient to get all medications delivered once every 30 to 90 days per patient preference. Patient understands they have freedom to choose pharmacy and clinical pharmacist will coordinate care between all prescribers and [facility].  Follow up: 4 week phone visit  CourtJannette FogormD Clinical Pharmacist [facility] [phone]

## 2019-09-08 ENCOUNTER — Telehealth: Payer: Self-pay

## 2019-09-08 NOTE — Chronic Care Management (AMB) (Signed)
    Chronic Care Management Pharmacy Assistant   Name: Gabriella Acosta  MRN: [id number] DOB: 1946/01/01  Reason for Encounter: Patient Assistance Coordination for DM supplies   PCP : [name], FNP  Allergies:  No Known Allergies  Medications: Outpatient Encounter Medications as of 09/08/2019  Medication Sig Note  . amLODipine (NORVASC) 5 MG tablet TAKE 1 TABLET(5 MG) BY MOUTH DAILY   . atorvastatin (LIPITOR) 80 MG tablet Take 1 tablet (80 mg total) by mouth daily.   . Blood Glucose Monitoring Suppl DEVI Check blood sugars twice daily. E11.9   . Continuous Blood Gluc Sensor (FREESTYLE LIBRE 2 SENSOR) MISC Use to check blood sugars daily   . diclofenac sodium (VOLTAREN) 1 % GEL Apply 2 g topically 4 (four) times daily. (Patient not taking: Reported on 12/10/2018)   . fluconazole (DIFLUCAN) 100 MG tablet Take 1 tablet by mouth today then repeat in 5 days.   [name] glucose blood (ACCU-CHEK GUIDE) test strip Use as instructed   . Insulin Glargine (BASAGLAR KWIKPEN) 100 UNIT/ML Inject 30 Units into the skin at bedtime. 03/31/2019: [employer] patient assistance  . Insulin Pen Needle (PEN NEEDLES) 32G X 4 MM MISC 1 Stick by Does not apply route daily.   . Lancets (ACCU-CHEK SOFT TOUCH) lancets Use as instructed   . magnesium 30 MG tablet Take 30 mg by mouth once a week.   . meloxicam (MOBIC) 15 MG tablet TK 1 T PO QD AS NEEDED WITH FOOD (Patient not taking: Reported on 08/04/2019)   . potassium chloride (KLOR-CON 10) 10 MEQ tablet Take 1 tablet (10 mEq total) by mouth daily.   . sitaGLIPtin (JANUVIA) 100 MG tablet Take 1 tablet (100 mg total) by mouth daily.   [name] telmisartan (MICARDIS) 20 MG tablet TAKE 1 TABLET(20 MG) BY MOUTH DAILY    No facility-administered encounter medications on file as of 09/08/2019.    Current Diagnosis: Patient Active Problem List   Diagnosis Date Noted  . Cognitive deficits 11/07/2018  . ETOH abuse 07/30/2013  . Anxiety 07/30/2013  . Essential hypertension  01/31/2011     Follow-Up:  Coordination of Enhanced Pharmacy Services and Patient Assistance Coordination- DM supplies requested for [employer], submitted form to [employer] along with demographic, insurance card, prescription and notes. Patient notified by Jon Gills, CPA that she will be contacted by [facility] regarding DM supplies. Beryle Flock, CPP notified.   Billee Cashing, CMA Clinical Pharmacist Assistant [phone]

## 2019-09-09 NOTE — Patient Instructions (Addendum)
Visit Information  Goals Addressed            This Visit's Progress   . Pharmacy Care Plan       CARE PLAN ENTRY (see longitudinal plan of care for additional care plan information)  Current Barriers:  . Chronic Disease Management support, education, and care coordination needs related to Hypertension, Hyperlipidemia, and Diabetes   Hypertension BP Readings from Last 3 Encounters:  07/30/19 124/70  07/30/19 124/70  02/04/19 122/76   . Pharmacist Clinical Goal(s): o Over the next 180 days, patient will work with PharmD and providers to maintain BP goal <130/80 . Current regimen:  . Amlodipine 5mg  daily . Telmisartan 20mg  daily . Interventions: o Provided dietary and exercise recommendations o Check blood pressure when light headed or dizzy . Patient self care activities - Over the next 180 days, patient will: o Check BP 3-5 times weekly, document, and provide at future appointments o Ensure daily salt intake < 2300 mg/day o Exercise for 30 minutes daily, 5 times weekly  Hyperlipidemia Lab Results  Component Value Date/Time   LDLCALC Comment (A) 07/30/2019 03:33 PM   . Pharmacist Clinical Goal(s): o Over the next 90 days, patient will work with PharmD and providers to achieve LDL goal < 70 . Current regimen:  o Atorvastatin 80mg  daily Interventions: o Provided dietary and exercise recommendations o Discussed cholesterol levels and provided patient education . Patient self care activities - Over the next 90 days, patient will: o Exercise for 30 minutes daily, 5 times weekly o Limit fried foods, fast food, and junk food o Focus on a healthy, balanced diet using the PLATE method discussed  Diabetes Lab Results  Component Value Date/Time   HGBA1C 10.4 (H) 07/30/2019 03:33 PM   HGBA1C 13.4 (H) 02/04/2019 04:01 PM   . Pharmacist Clinical Goal(s): o Over the next 90 days, patient will work with PharmD and providers to achieve A1c goal <7% . Current regimen:   . Basaglar Kwikpen 30 units at bedtime . Januvia 100mg  daily . Fiasp three times daily per sliding scale . Interventions: o Provided dietary and exercise recommendations o Provided patient education about importance of blood sugar control o Januvia increased to 100mg  and Fiasp sliding scale added after last CCM visit with PharmD. Samples of Fiasp provided in office and directions provided o sensors denied at 08/01/2019. [facility] after addition of Fiasp to medication list . Patient self care activities - Over the next 30 days, patient will: o Check blood sugar twice daily, document, and provide at future appointments o Contact provider with any episodes of hypoglycemia o Exercise 10-15 minutes daily 3 times weekly and work towards goal of 30 minutes 5 times weekly o Focus on a healthy, balanced diet using the PLATE method discussed o Continue Januvia 100mg  daily o Resume Fiasp insulin per sliding scale directions (below)   Diabetic Sliding Scale (I)  IF BLOOD SUGAR IS:  LESS THAN 100 ( NO INSULIN)  100-140 (2 UNITS)  141-180 (4 UNITS)  181-220 (6 UNITS)  221-260 (8 UNITS)  261-300 (10 UNITS)  301-340 (12 UNITS)  MORE THAN 341 UNITS (14 UNITS)  o Check blood sugar before breakfast, lunch, and dinner and then administer appropriate amount of insulin based on directions at the start of meal or within the first 20 minutes of starting meal o Recommend patient use regular blood sugar meter until new FreeStyle Libre sensors can be obtained  Medication management . Pharmacist Clinical  Goal(s): o Over the next 90 days, patient will work with PharmD and providers to achieve optimal medication adherence . Current pharmacy: [facility] . Interventions o Comprehensive medication review performed. o Utilize [facility] for medication synchronization, packaging and delivery . Patient self care activities - Over the next 90 days, patient  will: o Focus on medication adherence by using a pill box to organize medications o Take medications as prescribed o Report any questions or concerns to PharmD and/or provider(s)  Please see past updates related to this goal by clicking on the "Past Updates" button in the selected goal         The patient verbalized understanding of instructions provided today and agreed to receive a mailed copy of patient instruction and/or educational materials.  Telephone follow up appointment with pharmacy team member scheduled for:10/03/19 @ 2:00 PM  Beryle Flock, PharmD Clinical Pharmacist [facility] [phone]   Diabetes Mellitus and Nutrition, Adult When you have diabetes (diabetes mellitus), it is very important to have healthy eating habits because your blood sugar (glucose) levels are greatly affected by what you eat and drink. Eating healthy foods in the appropriate amounts, at about the same times every day, can help you:  Control your blood glucose.  Lower your risk of heart disease.  Improve your blood pressure.  Reach or maintain a healthy weight. Every person with diabetes is different, and each person has different needs for a meal plan. Your health care provider may recommend that you work with a diet and nutrition specialist (dietitian) to make a meal plan that is best for you. Your meal plan may vary depending on factors such as:  The calories you need.  The medicines you take.  Your weight.  Your blood glucose, blood pressure, and cholesterol levels.  Your activity level.  Other health conditions you have, such as heart or kidney disease. How do carbohydrates affect me? Carbohydrates, also called carbs, affect your blood glucose level more than any other type of food. Eating carbs naturally raises the amount of glucose in your blood. Carb counting is a method for keeping track of how many carbs you eat. Counting carbs is important to keep  your blood glucose at a healthy level, especially if you use insulin or take certain oral diabetes medicines. It is important to know how many carbs you can safely have in each meal. This is different for every person. Your dietitian can help you calculate how many carbs you should have at each meal and for each snack. Foods that contain carbs include:  Bread, cereal, rice, pasta, and crackers.  Potatoes and corn.  Peas, beans, and lentils.  Milk and yogurt.  Fruit and juice.  Desserts, such as cakes, cookies, ice cream, and candy. How does alcohol affect me? Alcohol can cause a sudden decrease in blood glucose (hypoglycemia), especially if you use insulin or take certain oral diabetes medicines. Hypoglycemia can be a life-threatening condition. Symptoms of hypoglycemia (sleepiness, dizziness, and confusion) are similar to symptoms of having too much alcohol. If your health care provider says that alcohol is safe for you, follow these guidelines:  Limit alcohol intake to no more than 1 drink per day for nonpregnant women and 2 drinks per day for men. One drink equals 12 oz of beer, 5 oz of wine, or 1 oz of hard liquor.  Do not drink on an empty stomach.  Keep yourself hydrated with water, diet soda, or unsweetened iced tea.  Keep in mind  that regular soda, juice, and other mixers may contain a lot of sugar and must be counted as carbs. What are tips for following this plan?  Reading food labels  Start by checking the serving size on the "Nutrition Facts" label of packaged foods and drinks. The amount of calories, carbs, fats, and other nutrients listed on the label is based on one serving of the item. Many items contain more than one serving per package.  Check the total grams (g) of carbs in one serving. You can calculate the number of servings of carbs in one serving by dividing the total carbs by 15. For example, if a food has 30 g of total carbs, it would be equal to 2 servings  of carbs.  Check the number of grams (g) of saturated and trans fats in one serving. Choose foods that have low or no amount of these fats.  Check the number of milligrams (mg) of salt (sodium) in one serving. Most people should limit total sodium intake to less than 2,300 mg per day.  Always check the nutrition information of foods labeled as "low-fat" or "nonfat". These foods may be higher in added sugar or refined carbs and should be avoided.  Talk to your dietitian to identify your daily goals for nutrients listed on the label. Shopping  Avoid buying canned, premade, or processed foods. These foods tend to be high in fat, sodium, and added sugar.  Shop around the outside edge of the grocery store. This includes fresh fruits and vegetables, bulk grains, fresh meats, and fresh dairy. Cooking  Use low-heat cooking methods, such as baking, instead of high-heat cooking methods like deep frying.  Cook using healthy oils, such as olive, canola, or sunflower oil.  Avoid cooking with butter, cream, or high-fat meats. Meal planning  Eat meals and snacks regularly, preferably at the same times every day. Avoid going long periods of time without eating.  Eat foods high in fiber, such as fresh fruits, vegetables, beans, and whole grains. Talk to your dietitian about how many servings of carbs you can eat at each meal.  Eat 4-6 ounces (oz) of lean protein each day, such as lean meat, chicken, fish, eggs, or tofu. One oz of lean protein is equal to: ? 1 oz of meat, chicken, or fish. ? 1 egg. ?  cup of tofu.  Eat some foods each day that contain healthy fats, such as avocado, nuts, seeds, and fish. Lifestyle  Check your blood glucose regularly.  Exercise regularly as told by your health care provider. This may include: ? 150 minutes of moderate-intensity or vigorous-intensity exercise each week. This could be brisk walking, biking, or water aerobics. ? Stretching and doing strength  exercises, such as yoga or weightlifting, at least 2 times a week.  Take medicines as told by your health care provider.  Do not use any products that contain nicotine or tobacco, such as cigarettes and e-cigarettes. If you need help quitting, ask your health care provider.  Work with a Veterinary surgeon or diabetes educator to identify strategies to manage stress and any emotional and social challenges. Questions to ask a health care provider  Do I need to meet with a diabetes educator?  Do I need to meet with a dietitian?  What number can I call if I have questions?  When are the best times to check my blood glucose? Where to find more information:  American Diabetes Association: diabetes.org  Academy of Nutrition and Dietetics: www.eatright.org  National  Institute of Diabetes and Digestive and Kidney Diseases (NIH): [employer] Summary  A healthy meal plan will help you control your blood glucose and maintain a healthy lifestyle.  Working with a diet and nutrition specialist (dietitian) can help you make a meal plan that is best for you.  Keep in mind that carbohydrates (carbs) and alcohol have immediate effects on your blood glucose levels. It is important to count carbs and to use alcohol carefully. This information is not intended to replace advice given to you by your health care provider. Make sure you discuss any questions you have with your health care provider. Document Revised: 12/08/2016 Document Reviewed: 01/31/2016 Elsevier Patient Education  2020 [employer].

## 2019-09-17 ENCOUNTER — Other Ambulatory Visit: Payer: Self-pay | Admitting: Nurse Practitioner

## 2019-09-18 ENCOUNTER — Other Ambulatory Visit: Payer: Self-pay | Admitting: Nurse Practitioner

## 2019-09-19 ENCOUNTER — Other Ambulatory Visit: Payer: Self-pay | Admitting: Nurse Practitioner

## 2019-09-19 DIAGNOSIS — E1165 Type 2 diabetes mellitus with hyperglycemia: Secondary | ICD-10-CM

## 2019-09-22 ENCOUNTER — Other Ambulatory Visit: Payer: Self-pay

## 2019-09-22 DIAGNOSIS — E876 Hypokalemia: Secondary | ICD-10-CM

## 2019-09-22 DIAGNOSIS — E1165 Type 2 diabetes mellitus with hyperglycemia: Secondary | ICD-10-CM

## 2019-09-22 DIAGNOSIS — E782 Mixed hyperlipidemia: Secondary | ICD-10-CM

## 2019-09-22 MED ORDER — AMLODIPINE BESYLATE 5 MG PO TABS: ORAL_TABLET | ORAL | 0 refills | Status: DC

## 2019-09-22 MED ORDER — POTASSIUM CHLORIDE ER 10 MEQ PO TBCR: 10.0000 meq | EXTENDED_RELEASE_TABLET | Freq: Every day | ORAL | 1 refills | Status: DC

## 2019-09-22 MED ORDER — TELMISARTAN 20 MG PO TABS: ORAL_TABLET | ORAL | 1 refills | Status: DC

## 2019-09-22 MED ORDER — ATORVASTATIN CALCIUM 80 MG PO TABS: 80.0000 mg | ORAL_TABLET | Freq: Every day | ORAL | 1 refills | Status: DC

## 2019-09-22 MED ORDER — SITAGLIPTIN PHOSPHATE 100 MG PO TABS: 100.0000 mg | ORAL_TABLET | Freq: Every day | ORAL | 0 refills | Status: DC

## 2019-09-22 MED ORDER — FLUCONAZOLE 100 MG PO TABS: ORAL_TABLET | ORAL | 0 refills | Status: DC

## 2019-10-02 DIAGNOSIS — H16143 Punctate keratitis, bilateral: Secondary | ICD-10-CM | POA: Diagnosis not present

## 2019-10-02 DIAGNOSIS — H16423 Pannus (corneal), bilateral: Secondary | ICD-10-CM | POA: Diagnosis not present

## 2019-10-02 LAB — HM DIABETES EYE EXAM

## 2019-10-03 ENCOUNTER — Telehealth: Payer: Self-pay

## 2019-10-03 NOTE — Chronic Care Management (AMB) (Deleted)
Chronic Care Management Pharmacy  Name: Gabriella Acosta  MRN: [id number] DOB: Feb 21, 1945  Chief Complaint/ HPI  [name],  74 y.o. , female presents for their Follow-Up CCM visit with the clinical pharmacist via telephone due to COVID-19 Pandemic.  PCP : [name], FNP  Their chronic conditions include: Hypertension, Hyperlipidemia, Diabetes and Anxiety  Office Visits: 07/30/19 AWV and OV: HTN/DM follow up. Does not check BG at home. Refused colonoscopy and Cologuard. Interested in pneumonia vaccine. Referred to Ophthalmology. Labs ordered (HgbA1c, CMP14+EGFR, CBC, TB, lipid panel). Encouraged exercise. Continue current meds. Given prescription for PCV13. Atorvastatin increased from 4m daily to 846mdaily.   Consult Visits:  CCM Encounters: 03/27/19 PharmD: Samples of Januvia provided on 3/22. Attestation form mailed back without application so unable to process, incoming pharmacy team to take over. Rosuvastatin switched to atorvastatin due to cost.   02/18/19 CPhT: Confirmed [employer] received patient application for Januvia patient assistance. Waiting on attestation form that was mailed to patient on 02/03/19. Per PharmD, pt has mailed attestation form back to [facility] in 10-14 days  02/17/19 PharmD: Medicaid assistance denied. Assisted patient with PAP applications for Januvia and Basaglar.   02/11/19 CPhT: Basaglar patient assistance approved through [employer] 01/09/20  Medications: Outpatient Encounter Medications as of 10/03/2019  Medication Sig Note  . amLODipine (NORVASC) 5 MG tablet TAKE 1 TABLET(5 MG) BY MOUTH DAILY   . atorvastatin (LIPITOR) 80 MG tablet Take 1 tablet (80 mg total) by mouth daily.   . Blood Glucose Monitoring Suppl DEVI Check blood sugars twice daily. E11.9   . Continuous Blood Gluc Sensor (FREESTYLE LIBRE 2 SENSOR) MISC Use to check blood sugars daily   . diclofenac sodium (VOLTAREN) 1 % GEL Apply 2 g topically 4 (four) times daily.  (Patient not taking: Reported on 12/10/2018)   . fluconazole (DIFLUCAN) 100 MG tablet Take 1 tablet by mouth today then repeat in 5 days.   . Marland Kitchenlucose blood (ACCU-CHEK GUIDE) test strip Use as instructed   . insulin aspart (FIASP FLEXTOUCH) 100 UNIT/ML FlexTouch Pen Inject into the skin. Use as directed per sliding scale three times daily   . Insulin Glargine (BASAGLAR KWIKPEN) 100 UNIT/ML Inject 30 Units into the skin at bedtime. 03/31/2019: [employer] patient assistance  . Insulin Pen Needle (PEN NEEDLES) 32G X 4 MM MISC 1 Stick by Does not apply route daily.   . Lancets (ACCU-CHEK SOFT TOUCH) lancets Use as instructed   . magnesium 30 MG tablet Take 30 mg by mouth once a week.   . meloxicam (MOBIC) 15 MG tablet TK 1 T PO QD AS NEEDED WITH FOOD (Patient not taking: Reported on 08/04/2019)   . potassium chloride (KLOR-CON 10) 10 MEQ tablet Take 1 tablet (10 mEq total) by mouth daily.   . sitaGLIPtin (JANUVIA) 100 MG tablet Take 1 tablet (100 mg total) by mouth daily.   . Marland Kitchenelmisartan (MICARDIS) 20 MG tablet TAKE 1 TABLET(20 MG) BY MOUTH DAILY    No facility-administered encounter medications on file as of 10/03/2019.    Current Diagnosis/Assessment:    Goals Addressed   None     Diabetes   A1c goal <7%  Recent Relevant Labs: Lab Results  Component Value Date/Time   HGBA1C 10.4 (H) 07/30/2019 03:33 PM   HGBA1C 13.4 (H) 02/04/2019 04:01 PM   MICROALBUR 30 12/10/2018 05:09 PM   MICROALBUR 150 10/31/2017 12:47 PM   Kidney Function Lab Results  Component Value Date/Time   CREATININE 0.59  07/30/2019 03:33 PM   CREATININE 0.70 02/04/2019 04:01 PM   GFRNONAA 92 07/30/2019 03:33 PM   GFRAA 106 07/30/2019 03:33 PM   K 3.5 07/30/2019 03:33 PM   K 4.6 02/04/2019 04:01 PM   Last diabetic Eye exam:  Lab Results  Component Value Date/Time   HMDIABEYEEXA No Retinopathy 09/02/2019 12:00 AM    Last diabetic Foot exam: 12/10/18  Checking BG: Sometimes Daily  Recent FBG Readings:  Recent  pre-meal BG readings:  Recent 2hr PP BG readings:   Recent HS BG readings:   Patient has failed these meds in past: Tresiba, Tradjenta, Ozempic  Patient is currently controlled on the following medications: . Basaglar Kwikpen 30 units at bedtime . Januvia 12m daily . Fiasp sliding scale  We discussed:  . Diet extensively o Pt does not have much of an appetite, but says she has been forcing herself to eat some o Kale salad, salmon, chicken . Exercise extensively o Joined a gym and has been working out 3 days a week for 30-45 minutes each time o Congratulated pt on exercising! . Pt had not been checking BG for the past 2 weeks, needs new FreeStyle Libre sensors o Advised pt that Rx was sent in for sensors [facility] She is going to call [facility] that we could send prescription for LBraymersensors if she is unable to obtain them [facility] [facility] and stated that [facility] said pt's insurance would not cover FAn[place]most likely denied because pt's FClaiborne Billingshas not been added to medication list. Will add to medication list to show that pt meets criteria (insulin administered 3 times daily) o Collaboration with CMA, TDellia Cloud [facility] . Pt increased to Januvia 1048msince last office visit . Pt has not been using Fiasp because she has not been checking BG recently . Advised pt to go back to using regular blood glucose meter until new sensors can be obtained  Plan Continue current medications   Hyperlipidemia   LDL goal < 70  Lipid Panel     Component Value Date/Time   CHOL 257 (H) 07/30/2019 1533   TRIG 863 (HH) 07/30/2019 1533   HDL 34 (L) 07/30/2019 1533   LDLCALC Comment (A) 07/30/2019 1533    Hepatic Function Latest Ref Rng & Units 07/30/2019 02/04/2019 12/10/2018  Total Protein 6.0 - 8.5 g/dL 8.0 7.3 7.4  Albumin 3.7 - 4.7 g/dL 4.5 4.4 4.4  AST 0 - 40  IU/L '22 14 14  ' ALT 0 - 32 IU/L '18 11 12  ' Alk Phosphatase 48 - 121 IU/L 179(H) 171(H) 213(H)  Total Bilirubin 0.0 - 1.2 mg/dL 0.2 <0.2 <0.2    The 10-year ASCVD risk score (GMikey BussingC Jr., et al., 2013) is: 41.6%   Values used to calculate the score:     Age: [age]ears     Sex: Female     Is Non-Hispanic African American: No     Diabetic: Yes     Tobacco smoker: Yes     Systolic Blood Pressure: 12761mHg     Is BP treated: Yes     HDL Cholesterol: 34 mg/dL     Total Cholesterol: 257 mg/dL   Patient has failed these meds in past: Rosuvastatin Patient is currently uncontrolled on the following medications:  . Atorvastatin 807maily   Plan Continue current medications  Recheck lipid panel at  next appointment to determine if another medication is needed for additional triglyceride reduction  Hypertension   BP goal is:  <130/80  Office blood pressures are  BP Readings from Last 3 Encounters:  07/30/19 124/70  07/30/19 124/70  02/04/19 122/76   Patient checks BP at home 3-5x per week Patient home BP readings are ranging: 129/?  Patient has failed these meds in the past: HCTZ, telmisartan/HCTZ Patient is currently controlled on the following medications:  . Amlodipine 79m daily . Telmisartan 231mdaily  We discussed: o Pt says that BP has stabilized and has been doing well o Denies dizziness and headaches  Plan Continue current medications   Osteopenia / Osteoporosis   Last DEXA Scan: Ordered   We discussed:  Has patient received phone call from breast center or [facility] PCP about scheduling DEXA scan  Health Maintenance   Patient is currently on the following medications:  . Marland Kitchenagnesium 3022maily . Potassium Chloride 67m55maily  Plan Continue current medications   Vaccines   Reviewed and discussed patient's vaccination history.    Immunization History  Administered Date(s) Administered  . Janssen (J&J) SARS-COV-2 Vaccination  03/17/2019  . Tdap 01/25/2013, 07/29/2013   We discussed:  Pt has not had Shingrix vaccine  Pneumonia  Plan Recommend Pneumovax vaccine in office at physical in December  Medication Management   [facility] Uses pill box? No - Does not have trouble remembering to take her medicines Pt endorses 100% compliance  We discussed:  . Importance of taking each medications daily as directed  Medication synchronization, adherence packaging, and delivery available with [facility]  Instructed pt [facility] and asked to be removed from autofill***  Ask if [facility]  Plan Utilize [facility] for medication synchronization, packaging and delivery   Verbal consent obtained for [facility] enhanced pharmacy services (medication synchronization, adherence packaging, delivery coordination). A medication sync plan was created to allow patient to get all medications delivered once every 30 to 90 days per patient preference. Patient understands they have freedom to choose pharmacy and clinical pharmacist will coordinate care between all prescribers and [facility].  Follow up: 4 week phone visit  CourJannette FogoarmD Clinical Pharmacist [facility] [phone]

## 2019-10-08 ENCOUNTER — Ambulatory Visit (INDEPENDENT_AMBULATORY_CARE_PROVIDER_SITE_OTHER): Payer: Medicare PPO

## 2019-10-08 ENCOUNTER — Telehealth: Payer: Medicare PPO

## 2019-10-08 ENCOUNTER — Other Ambulatory Visit: Payer: Self-pay

## 2019-10-08 DIAGNOSIS — E1165 Type 2 diabetes mellitus with hyperglycemia: Secondary | ICD-10-CM

## 2019-10-08 DIAGNOSIS — E782 Mixed hyperlipidemia: Secondary | ICD-10-CM

## 2019-10-08 DIAGNOSIS — I10 Essential (primary) hypertension: Secondary | ICD-10-CM

## 2019-10-09 ENCOUNTER — Telehealth: Payer: Self-pay

## 2019-10-09 NOTE — Progress Notes (Signed)
    Chronic Care Management Pharmacy Assistant   Name: Yaritzel Stange  MRN: [id number] DOB: [birth year]  Reason for Encounter: Medication Review   PCP : [name], FNP  Allergies:  No Known Allergies  Medications: Outpatient Encounter Medications as of 10/09/2019  Medication Sig Note  . amLODipine (NORVASC) 5 MG tablet TAKE 1 TABLET(5 MG) BY MOUTH DAILY   . atorvastatin (LIPITOR) 80 MG tablet Take 1 tablet (80 mg total) by mouth daily.   . Blood Glucose Monitoring Suppl DEVI Check blood sugars twice daily. E11.9   . Continuous Blood Gluc Sensor (FREESTYLE LIBRE 2 SENSOR) MISC Use to check blood sugars daily   . diclofenac sodium (VOLTAREN) 1 % GEL Apply 2 g topically 4 (four) times daily. (Patient not taking: Reported on 12/10/2018)   . fluconazole (DIFLUCAN) 100 MG tablet Take 1 tablet by mouth today then repeat in 5 days.   [name] glucose blood (ACCU-CHEK GUIDE) test strip Use as instructed   . insulin aspart (FIASP FLEXTOUCH) 100 UNIT/ML FlexTouch Pen Inject into the skin. Use as directed per sliding scale three times daily   . Insulin Glargine (BASAGLAR KWIKPEN) 100 UNIT/ML Inject 30 Units into the skin at bedtime. 03/31/2019: [employer] patient assistance  . Insulin Pen Needle (PEN NEEDLES) 32G X 4 MM MISC 1 Stick by Does not apply route daily.   . Lancets (ACCU-CHEK SOFT TOUCH) lancets Use as instructed   . magnesium 30 MG tablet Take 30 mg by mouth once a week.   . meloxicam (MOBIC) 15 MG tablet TK 1 T PO QD AS NEEDED WITH FOOD (Patient not taking: Reported on 08/04/2019)   . potassium chloride (KLOR-CON 10) 10 MEQ tablet Take 1 tablet (10 mEq total) by mouth daily.   . sitaGLIPtin (JANUVIA) 100 MG tablet Take 1 tablet (100 mg total) by mouth daily.   [name] telmisartan (MICARDIS) 20 MG tablet TAKE 1 TABLET(20 MG) BY MOUTH DAILY    No facility-administered encounter medications on file as of 10/09/2019.    Current Diagnosis: Patient Active Problem List   Diagnosis Date Noted  .  Cognitive deficits 11/07/2018  . ETOH abuse 07/30/2013  . Anxiety 07/30/2013  . Essential hypertension 01/31/2011   10/09/2019 Called [facility] to check on the status of patient's diabetic supplies, per [facility], order has been cancelled, due to missing information. They need  demographics , insurance card, office note and new script faxed, so they can process order.  Follow-Up:  Patient Assistance Coordination   Courtney Caudill,CPP notified.  Jon Gills, [facility] Clinical Pharmacist Assistant [phone]

## 2019-10-13 ENCOUNTER — Ambulatory Visit: Payer: Medicare PPO

## 2019-10-13 DIAGNOSIS — E782 Mixed hyperlipidemia: Secondary | ICD-10-CM

## 2019-10-13 DIAGNOSIS — I10 Essential (primary) hypertension: Secondary | ICD-10-CM

## 2019-10-13 DIAGNOSIS — E1165 Type 2 diabetes mellitus with hyperglycemia: Secondary | ICD-10-CM

## 2019-10-13 NOTE — Chronic Care Management (AMB) (Signed)
Chronic Care Management    Social Work Follow Up Note  10/13/2019 Name: Liyanna Cartwright MRN: [id number] DOB: 1946-01-06  Ashea Winiarski is a 74 y.o. year old female who is a primary care patient of [name], FNP. The CCM team was consulted for assistance with care coordination.   Review of patient status, including review of consultants reports, other relevant assessments, and collaboration with appropriate care team members and the patient's provider was performed as part of comprehensive patient evaluation and provision of chronic care management services.    SDOH (Social Determinants of Health) assessments performed: No    Outpatient Encounter Medications as of 10/13/2019  Medication Sig Note  . amLODipine (NORVASC) 5 MG tablet TAKE 1 TABLET(5 MG) BY MOUTH DAILY   . atorvastatin (LIPITOR) 80 MG tablet Take 1 tablet (80 mg total) by mouth daily.   . Blood Glucose Monitoring Suppl DEVI Check blood sugars twice daily. E11.9   . Continuous Blood Gluc Sensor (FREESTYLE LIBRE 2 SENSOR) MISC Use to check blood sugars daily   . diclofenac sodium (VOLTAREN) 1 % GEL Apply 2 g topically 4 (four) times daily. (Patient not taking: Reported on 12/10/2018)   . fluconazole (DIFLUCAN) 100 MG tablet Take 1 tablet by mouth today then repeat in 5 days.   [name] glucose blood (ACCU-CHEK GUIDE) test strip Use as instructed   . insulin aspart (FIASP FLEXTOUCH) 100 UNIT/ML FlexTouch Pen Inject into the skin. Use as directed per sliding scale three times daily   . Insulin Glargine (BASAGLAR KWIKPEN) 100 UNIT/ML Inject 30 Units into the skin at bedtime. 03/31/2019: [employer] patient assistance  . Insulin Pen Needle (PEN NEEDLES) 32G X 4 MM MISC 1 Stick by Does not apply route daily.   . Lancets (ACCU-CHEK SOFT TOUCH) lancets Use as instructed   . magnesium 30 MG tablet Take 30 mg by mouth once a week.   . meloxicam (MOBIC) 15 MG tablet TK 1 T PO QD AS NEEDED WITH FOOD (Patient not taking: Reported on 08/04/2019)    . potassium chloride (KLOR-CON 10) 10 MEQ tablet Take 1 tablet (10 mEq total) by mouth daily.   . sitaGLIPtin (JANUVIA) 100 MG tablet Take 1 tablet (100 mg total) by mouth daily.   [name] telmisartan (MICARDIS) 20 MG tablet TAKE 1 TABLET(20 MG) BY MOUTH DAILY    No facility-administered encounter medications on file as of 10/13/2019.     Goals Addressed              This Visit's Progress     Patient Stated   .  COMPLETED: I would like to apply for medicaid (pt-stated)        Current Barriers:  . Financial Barriers  Pharmacist Clinical Goal(s):  [name] Over the next 60 days, patient will work with CCM team and PCP to apply for Medicaid to address needs related to d  10/13/19- Performed chart review to note outdated goal- see SW care plan  Interventions: . Comprehensive medication review performed. . Collaboration with provider re: medication management . Referred to Clinic Social Worker for [employer] application assistance . Number given to patient for [facility] DSS--patient states she called and application status is pending.  Patient states she is brining in less money per month since she is out of work.  Patient provided with samples until application status determined.  Discussed patient assistance with patient and filled out the necessary paperwork if PAP needed. . Case discussed with CCM BSW, Ahmeer Tuman.  Appreciate assistance.  Patient Self Care Activities:  . Self administers medications as prescribed . Attends all scheduled provider appointments . Calls pharmacy for medication refills . Calls provider office for new concerns or questions  Please see past updates related to this goal by clicking on the "Past Updates" button in the selected goal      .  COMPLETED: I would like to apply for medication assistance (pt-stated)        Current Barriers:  . Financial Barriers in complicated patient with multiple medical conditions including T2DM, HTN, HLD; patient has [employer] and reports copay for Evaristo Bury & Alma Friendly is cost prohibitive at this time   Pharmacist Clinical Goal(s):  [name] Over the next 30 days, patient will work with PharmD and providers to relieve medication access concerns  10/13/19- SW performed chart review to note patient active with Pharmacist Beryle Flock- see pharmacy care plan  Interventions: . Comprehensive medication review completed; medication list updated in electronic medical record.  Alma Friendly by [employer]: Patient meets income/NO out of pocket spend criteria for this medication's patient assistance program. Reviewed application process. Patient will provide proof of income, out of pocket spend report, and will sign application. Will collaborate with primary care provider, Dr [name], DNP, FNP, for their portion of application. Once completed, will submit to [employer] patient assistance program. . Application submitted for [employer] Psychologist, sport and exercise) and [employer] Alma Friendly)  Patient Self Care Activities:  . Patient will provide necessary portions of application   Initial goal documentation       Other   .  Collaborate with RN Care Manager to assist with patient care coordination needs        CARE PLAN ENTRY (see longitudinal plan of care for additional care plan information)  Current Barriers:  . Financial constraints related to the cost of medical care including medications . Chronic conditions including DM II, HTN, and Hyperlipidemia which put patient at increased risk for hospitalization  Social Work Clinical Goal(s):  [name] Over the next 90 days, patient will follow up with [facility] DSS regarding Medicaid application as directed by SW  CCM SW Interventions: Completed 10/13/19 . Inter-disciplinary care team collaboration (see longitudinal plan of care) . Successful outbound call placed to the patient to assist with care coordination needs . Determined the patient is interested in applying for  Medicaid . Performed chart review to note previous goal from 2020 to apply for Medicaid o The patient reports she never heard back about the status of her application . Discussed ability to apply online or via paper application; the patient requests paper application . Provided a paper application for Medicaid via mail . Assessed for patient interested to apply for nutritional support through [employer] o The patient reports she applied in the past and was told she qualified for $16 per month o The patient stated "I told them to keep that money" o The patient is not interested in re-applying at this time . Scheduled follow up call over the next 6 weeks  Patient Self Care Activities:  . Self administers medications as prescribed . Attends all scheduled provider appointments . Performs ADL's independently . Performs IADL's independently . Calls provider office for new concerns or questions  Initial goal documentation         Follow Up Plan: SW will follow up with patient by phone over the next 6 weeks.   Bevelyn Ngo, BSW, CDP Social Worker, Certified Dementia Practitioner [facility] Care Management [phone]  Total time spent performing care coordination and/or care management activities with the patient by phone or face to face = 15 minutes.

## 2019-10-13 NOTE — Patient Instructions (Signed)
Social Worker Visit Information  Goals we discussed today:  Goals Addressed              This Visit's Progress     Patient Stated   .  COMPLETED: I would like to apply for medicaid (pt-stated)        Current Barriers:  . Financial Barriers  Pharmacist Clinical Goal(s):  [name] Over the next 60 days, patient will work with CCM team and PCP to apply for Medicaid to address needs related to d  10/13/19- Performed chart review to note outdated goal- see SW care plan  Interventions: . Comprehensive medication review performed. . Collaboration with provider re: medication management . Referred to Clinic Social Worker for [employer] application assistance . Number given to patient for [facility] DSS--patient states she called and application status is pending.  Patient states she is brining in less money per month since she is out of work.  Patient provided with samples until application status determined.  Discussed patient assistance with patient and filled out the necessary paperwork if PAP needed. . Case discussed with CCM BSW, Sem Mccaughey.  Appreciate assistance.  Patient Self Care Activities:  . Self administers medications as prescribed . Attends all scheduled provider appointments . Calls pharmacy for medication refills . Calls provider office for new concerns or questions  Please see past updates related to this goal by clicking on the "Past Updates" button in the selected goal      .  COMPLETED: I would like to apply for medication assistance (pt-stated)        Current Barriers:  . Financial Barriers in complicated patient with multiple medical conditions including T2DM, HTN, HLD; patient has [employer] and reports copay for Evaristo Bury & Alma Friendly is cost prohibitive at this time   Pharmacist Clinical Goal(s):  [name] Over the next 30 days, patient will work with PharmD and providers to relieve medication access concerns  10/13/19- SW performed chart review to note  patient active with Pharmacist Beryle Flock- see pharmacy care plan  Interventions: . Comprehensive medication review completed; medication list updated in electronic medical record.  Alma Friendly by [employer]: Patient meets income/NO out of pocket spend criteria for this medication's patient assistance program. Reviewed application process. Patient will provide proof of income, out of pocket spend report, and will sign application. Will collaborate with primary care provider, Dr [name], DNP, FNP, for their portion of application. Once completed, will submit to [employer] patient assistance program. . Application submitted for [employer] Psychologist, sport and exercise) and [employer] Alma Friendly)  Patient Self Care Activities:  . Patient will provide necessary portions of application   Initial goal documentation       Other   .  Collaborate with RN Care Manager to assist with patient care coordination needs        CARE PLAN ENTRY (see longitudinal plan of care for additional care plan information)  Current Barriers:  . Financial constraints related to the cost of medical care including medications . Chronic conditions including DM II, HTN, and Hyperlipidemia which put patient at increased risk for hospitalization  Social Work Clinical Goal(s):  [name] Over the next 90 days, patient will follow up with [facility] DSS regarding Medicaid application as directed by SW  CCM SW Interventions: Completed 10/13/19 . Inter-disciplinary care team collaboration (see longitudinal plan of care) . Successful outbound call placed to the patient to assist with care coordination needs . Determined the patient is interested in applying for Medicaid . Performed  chart review to note previous goal from 2020 to apply for Medicaid o The patient reports she never heard back about the status of her application . Discussed ability to apply online or via paper application; the patient requests paper application . Provided a paper  application for Medicaid via mail . Assessed for patient interested to apply for nutritional support through [employer] o The patient reports she applied in the past and was told she qualified for $16 per month o The patient stated "I told them to keep that money" o The patient is not interested in re-applying at this time . Scheduled follow up call over the next 6 weeks  Patient Self Care Activities:  . Self administers medications as prescribed . Attends all scheduled provider appointments . Performs ADL's independently . Performs IADL's independently . Calls provider office for new concerns or questions  Initial goal documentation         Materials Provided: Yes: provided a Medicaid application via mail  Follow Up Plan: SW will follow up with patient by phone over the next 6 weeks.   Bevelyn Ngo, BSW, CDP Social Worker, Certified Dementia Practitioner [facility] Care Management [phone]

## 2019-10-15 ENCOUNTER — Ambulatory Visit: Payer: Medicare PPO | Admitting: Nurse Practitioner

## 2019-10-15 ENCOUNTER — Telehealth: Payer: Self-pay

## 2019-10-15 ENCOUNTER — Other Ambulatory Visit: Payer: Self-pay | Admitting: Nurse Practitioner

## 2019-10-15 ENCOUNTER — Telehealth: Payer: Medicare PPO

## 2019-10-15 DIAGNOSIS — E782 Mixed hyperlipidemia: Secondary | ICD-10-CM

## 2019-10-15 DIAGNOSIS — E1165 Type 2 diabetes mellitus with hyperglycemia: Secondary | ICD-10-CM | POA: Diagnosis not present

## 2019-10-15 NOTE — Chronic Care Management (AMB) (Signed)
Chronic Care Management   Follow Up Note   10/08/2019 Name: Gabriella Acosta MRN: [id number] DOB: 1945/09/11  Referred by: [name], FNP Reason for referral : Chronic Care Management (FU RN CM Call )   Gabriella Acosta is a 74 y.o. year old female who is a primary care patient of [name], FNP. The CCM team was consulted for assistance with chronic disease management and care coordination needs.    Review of patient status, including review of consultants reports, relevant laboratory and other test results, and collaboration with appropriate care team members and the patient's provider was performed as part of comprehensive patient evaluation and provision of chronic care management services.    SDOH (Social Determinants of Health) assessments performed: Yes - no acute challenges identified at this time See Care Plan activities for detailed interventions related to SDOH)   Placed outbound CCM RN CM follow up call to patient for a care plan update.    Outpatient Encounter Medications as of 10/08/2019  Medication Sig Note  . amLODipine (NORVASC) 5 MG tablet TAKE 1 TABLET(5 MG) BY MOUTH DAILY   . atorvastatin (LIPITOR) 80 MG tablet Take 1 tablet (80 mg total) by mouth daily.   . Blood Glucose Monitoring Suppl DEVI Check blood sugars twice daily. E11.9   . Continuous Blood Gluc Sensor (FREESTYLE LIBRE 2 SENSOR) MISC Use to check blood sugars daily   . diclofenac sodium (VOLTAREN) 1 % GEL Apply 2 g topically 4 (four) times daily. (Patient not taking: Reported on 12/10/2018)   . fluconazole (DIFLUCAN) 100 MG tablet Take 1 tablet by mouth today then repeat in 5 days.   [name] glucose blood (ACCU-CHEK GUIDE) test strip Use as instructed   . insulin aspart (FIASP FLEXTOUCH) 100 UNIT/ML FlexTouch Pen Inject into the skin. Use as directed per sliding scale three times daily   . Insulin Glargine (BASAGLAR KWIKPEN) 100 UNIT/ML Inject 30 Units into the skin at bedtime. 03/31/2019: Lilly  patient assistance  . Insulin Pen Needle (PEN NEEDLES) 32G X 4 MM MISC 1 Stick by Does not apply route daily.   . Lancets (ACCU-CHEK SOFT TOUCH) lancets Use as instructed   . magnesium 30 MG tablet Take 30 mg by mouth once a week.   . meloxicam (MOBIC) 15 MG tablet TK 1 T PO QD AS NEEDED WITH FOOD (Patient not taking: Reported on 08/04/2019)   . potassium chloride (KLOR-CON 10) 10 MEQ tablet Take 1 tablet (10 mEq total) by mouth daily.   . sitaGLIPtin (JANUVIA) 100 MG tablet Take 1 tablet (100 mg total) by mouth daily.   [name] telmisartan (MICARDIS) 20 MG tablet TAKE 1 TABLET(20 MG) BY MOUTH DAILY    No facility-administered encounter medications on file as of 10/08/2019.     Objective:  Lab Results  Component Value Date   HGBA1C 10.4 (H) 07/30/2019   HGBA1C 13.4 (H) 02/04/2019   HGBA1C 13.7 (H) 01/15/2019   Lab Results  Component Value Date   MICROALBUR 30 12/10/2018   LDLCALC Comment (A) 07/30/2019   CREATININE 0.59 07/30/2019   BP Readings from Last 3 Encounters:  07/30/19 124/70  07/30/19 124/70  02/04/19 122/76    Goals Addressed      Patient Stated   .  "not to lose anymore weight" (pt-stated)        CARE PLAN ENTRY (see longitudinal plan of care for additional care plan information)  Current Barriers:  [name] Knowledge Deficits related to Self Health management of weight management  .  Chronic Disease Management support and education needs related to DM II, HTN, Mixed hyperlipidemia  Nurse Case Manager Clinical Goal(s):  [name] Over the next 180 days, patient will work with the CCM team and PCP to address needs related to disease education and support for improved Self Health management of weight management   CCM RN CM Interventions:  10/08/19 call completed with patient  . Inter-disciplinary care team collaboration (see longitudinal plan of care) . Evaluation of current treatment plan related to Weight Management and patient's adherence to plan as established by  provider . Determined patient has a chronically poor appetite; Determined patient usually eats 3 meals per day but is often not hungry when she eats, she is concerned about loosing too much weight due to poor appetite and and decreased caloric intake . Determined patient has used dietary supplements such as Glucerna in the past but not recently, she is agreeable to supplementing with Glucerna 2-3 times per day between meals . Provided education to patient re: importance of taking enough caloric intake to maintain weight and to help achieve optimal health by eating well balanced meals . Collaborated with PCP [name] FNP regarding patient's request for samples of Glucerna, PCP will provide samples to patient (will leave at front desk), patient aware . Discussed plans with patient for ongoing care management follow up and provided patient with direct contact information for care management team  Patient Self Care Activities:  . Self administers medications as prescribed . Attends all scheduled provider appointments . Calls pharmacy for medication refills . Calls provider office for new concerns or questions  Initial goal documentation     .  "to get my diabetes under better control" (pt-stated)        CARE PLAN ENTRY (see longitudinal plan of care for additional care plan information)  Current Barriers:  [name] Knowledge Deficits related to disease process and Self Health Management of Diabetes . Chronic Disease Management support and education needs related to type 2 Diabetes, HTN, Hyperlipidemia  Nurse Case Manager Clinical Goal(s):  [name] Over the next 90 days, patient will work with CCM team and PCP to address needs related to disease education and support for improved Self Health management of DM  CCM RN CM Interventions:  10/08/19 call completed with patient  . Inter-disciplinary care team collaboration (see longitudinal plan of care) . Evaluation of current treatment plan related to  Diabetes and patient's adherence to plan as established by provider. [name] Re-educated patient re: current A1c 10.4%; Re-Educated on target A1c <7.0; Re-Educated on dietary and exercise recommendations; Re-Educated on daily glycemic control FBS 80-130; <180 after meals; Re-Educated on 15'15' rule  . Reviewed medications with patient and discussed patient is adhering to taking her prescribed medications w/o noted SE or missed doses:  Basaglar Kwikpen 30 units at bedtime  Januvia 50mg  daily . Determined patient has not received her diabetic supplies via [employer], sent in basket message to embedded Pharm D with notification of missing DM supplies . Provided patient with mailed educational materials related to Living Well with Diabetes booklet  . Advised patient, providing education and rationale, to check cbg 1-2 daily before meals and record, calling the CCM team and PCP for findings outside established parameters . Discussed plans with patient for ongoing care management follow up and provided patient with direct contact information for care management team  Patient Self Care Activities:  . Self administers medications as prescribed . Attends all scheduled provider appointments . Calls pharmacy for medication refills .  Calls provider office for new concerns or questions  Please see past updates related to this goal by clicking on the "Past Updates" button in the selected goal      .  "to get my triglycerides down to normal range" (pt-stated)   On track     CARE PLAN ENTRY (see longitudinal plan of care for additional care plan information)  Current Barriers:  [name] Knowledge Deficits related to disease process and Self Health Management of Hyperlipidemia  . Chronic Disease Management support and education needs related to type 2 DM, HTN, Hyperlipidemia   Nurse Case Manager Clinical Goal(s):  [name] Over the next 90 days, patient will work with the CCM team and PCP to address needs related to disease  education and support to help improve Self Health management of Hyperlipidemia   CCM RN CM Interventions:  10/08/19 call completed with patient . Inter-disciplinary care team collaboration (see longitudinal plan of care) . Evaluation of current treatment plan related to Hyperlipidemia/Triglyceridemia and patient's adherence to plan as established by provider . Reviewed and discussed patient's elevated Cholesterol and Triglyceride levels with last levels noted, Cholesterol 257, Triglycerides 863 . Re-educated patient re: basic disease process; Re-educated on dietary and exercise recommendations; stressed importance of avoiding fried, fatty foods  . Reviewed medications with patient and discussed patient is adhering to the increased dosage of Atorvastatin 80 mg qd w/o noted SE; Educated patient on importance of taking anti-lipids exactly as prescribed w/o missed doses . Collaborated with PCP [name], FNP regarding patient's elevated lipids and reports from patient that she is adhering to taking her medications; discussed possible referral to [facility] lipid clinic and or switching patient's medication . Discussed plans with patient for ongoing care management follow up and provided patient with direct contact information for care management team . Provided patient with printed educational materials related to Triglycerides: Why They Matter; 13 Cholesterol Lowering Cholesterol Foods; 13 Triglyceride Lowering Foods   Patient Self Care Activities:  . Self administers medications as prescribed . Attends all scheduled provider appointments . Calls pharmacy for medication refills . Calls provider office for new concerns or questions  Please see past updates related to this goal by clicking on the "Past Updates" button in the selected goal        Plan:   Telephone follow up appointment with care management team member scheduled for: 11/06/19  Delsa Sale, RN, BSN, CCM Care Management  Coordinator [facility] Care Management/[facility]  Direct Phone: [phone]

## 2019-10-15 NOTE — Patient Instructions (Addendum)
Visit Information  Goals Addressed      Patient Stated   .  "not to lose anymore weight" (pt-stated)        CARE PLAN ENTRY (see longitudinal plan of care for additional care plan information)  Current Barriers:  [name] Knowledge Deficits related to Self Health management of weight management  . Chronic Disease Management support and education needs related to DM II, HTN, Mixed hyperlipidemia  Nurse Case Manager Clinical Goal(s):  [name] Over the next 180 days, patient will work with the CCM team and PCP to address needs related to disease education and support for improved Self Health management of weight management   CCM RN CM Interventions:  10/08/19 call completed with patient  . Inter-disciplinary care team collaboration (see longitudinal plan of care) . Evaluation of current treatment plan related to Weight Management and patient's adherence to plan as established by provider . Determined patient has a chronically poor appetite; Determined patient usually eats 3 meals per day but is often not hungry when she eats, she is concerned about loosing too much weight due to poor appetite and and decreased caloric intake . Determined patient has used dietary supplements such as Glucerna in the past but not recently, she is agreeable to supplementing with Glucerna 2-3 times per day between meals . Provided education to patient re: importance of taking enough caloric intake to maintain weight and to help achieve optimal health by eating well balanced meals . Collaborated with PCP [name] FNP regarding patient's request for samples of Glucerna, PCP will provide samples to patient (will leave at front desk), patient aware . Discussed plans with patient for ongoing care management follow up and provided patient with direct contact information for care management team  Patient Self Care Activities:  . Self administers medications as prescribed . Attends all scheduled provider appointments . Calls  pharmacy for medication refills . Calls provider office for new concerns or questions  Initial goal documentation     .  "to get my diabetes under better control" (pt-stated)        CARE PLAN ENTRY (see longitudinal plan of care for additional care plan information)  Current Barriers:  [name] Knowledge Deficits related to disease process and Self Health Management of Diabetes . Chronic Disease Management support and education needs related to type 2 Diabetes, HTN, Hyperlipidemia  Nurse Case Manager Clinical Goal(s):  [name] Over the next 90 days, patient will work with CCM team and PCP to address needs related to disease education and support for improved Self Health management of DM  CCM RN CM Interventions:  10/08/19 call completed with patient  . Inter-disciplinary care team collaboration (see longitudinal plan of care) . Evaluation of current treatment plan related to Diabetes and patient's adherence to plan as established by provider. [name] Re-educated patient re: current A1c 10.4%; Re-Educated on target A1c <7.0; Re-Educated on dietary and exercise recommendations; Re-Educated on daily glycemic control FBS 80-130; <180 after meals; Re-Educated on 15'15' rule  . Reviewed medications with patient and discussed patient is adhering to taking her prescribed medications w/o noted SE or missed doses:  Basaglar Kwikpen 30 units at bedtime  Januvia 50mg  daily . Determined patient has not received her diabetic supplies via [employer], sent in basket message to embedded Pharm D with notification of missing DM supplies . Provided patient with mailed educational materials related to Living Well with Diabetes booklet  . Advised patient, providing education and rationale, to check cbg 1-2 daily before meals and  record, calling the CCM team and PCP for findings outside established parameters . Discussed plans with patient for ongoing care management follow up and provided patient with direct contact information  for care management team  Patient Self Care Activities:  . Self administers medications as prescribed . Attends all scheduled provider appointments . Calls pharmacy for medication refills . Calls provider office for new concerns or questions  Please see past updates related to this goal by clicking on the "Past Updates" button in the selected goal      .  "to get my triglycerides down to normal range" (pt-stated)   On track     CARE PLAN ENTRY (see longitudinal plan of care for additional care plan information)  Current Barriers:  [name] Knowledge Deficits related to disease process and Self Health Management of Hyperlipidemia  . Chronic Disease Management support and education needs related to type 2 DM, HTN, Hyperlipidemia   Nurse Case Manager Clinical Goal(s):  [name] Over the next 90 days, patient will work with the CCM team and PCP to address needs related to disease education and support to help improve Self Health management of Hyperlipidemia   CCM RN CM Interventions:  10/08/19 call completed with patient . Inter-disciplinary care team collaboration (see longitudinal plan of care) . Evaluation of current treatment plan related to Hyperlipidemia/Triglyceridemia and patient's adherence to plan as established by provider . Reviewed and discussed patient's elevated Cholesterol and Triglyceride levels with last levels noted, Cholesterol 257, Triglycerides 863 . Re-educated patient re: basic disease process; Re-educated on dietary and exercise recommendations; stressed importance of avoiding fried, fatty foods  . Reviewed medications with patient and discussed patient is adhering to the increased dosage of Atorvastatin 80 mg qd w/o noted SE; Educated patient on importance of taking anti-lipids exactly as prescribed w/o missed doses . Collaborated with PCP [name], FNP regarding patient's elevated lipids and reports from patient that she is adhering to taking her medications; discussed  possible referral to [facility] lipid clinic and or switching patient's medication . Discussed plans with patient for ongoing care management follow up and provided patient with direct contact information for care management team . Provided patient with printed educational materials related to Triglycerides: Why They Matter; 13 Cholesterol Lowering Cholesterol Foods; 13 Triglyceride Lowering Foods   Patient Self Care Activities:  . Self administers medications as prescribed . Attends all scheduled provider appointments . Calls pharmacy for medication refills . Calls provider office for new concerns or questions  Please see past updates related to this goal by clicking on the "Past Updates" button in the selected goal        Patient verbalizes understanding of instructions provided today.   Telephone follow up appointment with care management team member scheduled for:  11/06/19  Delsa Sale, RN, BSN, CCM Care Management Coordinator [facility] Care Management/[facility]  Direct Phone: [phone]

## 2019-10-15 NOTE — Telephone Encounter (Cosign Needed)
  Chronic Care Management   Outreach Note  10/15/2019 Name: Gabriella Acosta MRN: [id number] DOB: 1945-01-30  Referred by: [name], FNP Reason for referral : Chronic Care Management (Inbound Call from patient )  Inbound call received from patient on 10/14/19. Voice message left by patient requesting a return phone call. Noted patient scheduled to f/u with PCP [name], FNP today at 11:30 am for htn/syncope. Received notification from PCP that the patient did not not show up for this appointment today. An unsuccessful telephone outreach was attempted today. The patient was referred to the case management team for assistance with care management and care coordination.   Follow Up Plan: Telephone follow up appointment with care management team member scheduled for: 10/16/19  Delsa Sale, RN, BSN, CCM Care Management Coordinator [facility] Care Management/[facility]  Direct Phone: [phone]

## 2019-10-16 ENCOUNTER — Other Ambulatory Visit: Payer: Self-pay

## 2019-10-16 ENCOUNTER — Telehealth: Payer: Medicare PPO

## 2019-10-16 ENCOUNTER — Ambulatory Visit: Payer: Self-pay

## 2019-10-16 DIAGNOSIS — E782 Mixed hyperlipidemia: Secondary | ICD-10-CM

## 2019-10-16 DIAGNOSIS — E1165 Type 2 diabetes mellitus with hyperglycemia: Secondary | ICD-10-CM

## 2019-10-16 DIAGNOSIS — I10 Essential (primary) hypertension: Secondary | ICD-10-CM

## 2019-10-20 ENCOUNTER — Ambulatory Visit (INDEPENDENT_AMBULATORY_CARE_PROVIDER_SITE_OTHER): Payer: Medicare PPO | Admitting: Nurse Practitioner

## 2019-10-20 ENCOUNTER — Encounter: Payer: Self-pay | Admitting: Nurse Practitioner

## 2019-10-20 ENCOUNTER — Other Ambulatory Visit: Payer: Self-pay

## 2019-10-20 VITALS — BP 136/70 | HR 86 | Temp 98.0°F | Ht 63.8 in | Wt 125.8 lb

## 2019-10-20 DIAGNOSIS — R55 Syncope and collapse: Secondary | ICD-10-CM | POA: Diagnosis not present

## 2019-10-20 DIAGNOSIS — I10 Essential (primary) hypertension: Secondary | ICD-10-CM

## 2019-10-20 DIAGNOSIS — E782 Mixed hyperlipidemia: Secondary | ICD-10-CM

## 2019-10-20 DIAGNOSIS — Z23 Encounter for immunization: Secondary | ICD-10-CM

## 2019-10-20 DIAGNOSIS — E781 Pure hyperglyceridemia: Secondary | ICD-10-CM | POA: Diagnosis not present

## 2019-10-20 DIAGNOSIS — E1165 Type 2 diabetes mellitus with hyperglycemia: Secondary | ICD-10-CM

## 2019-10-20 MED ORDER — NIACIN ER (ANTIHYPERLIPIDEMIC) 500 MG PO TBCR: 500.0000 mg | EXTENDED_RELEASE_TABLET | Freq: Every day | ORAL | 3 refills | Status: DC

## 2019-10-20 NOTE — Progress Notes (Signed)
I,Tianna Badgett,acting as a Education administrator for [employer], FNP.,have documented all relevant documentation on the behalf of [name], FNP,as directed by  [name], FNP while in the presence of [name], [place].  This visit occurred during the SARS-CoV-2 public health emergency.  Safety protocols were in place, including screening questions prior to the visit, additional usage of staff PPE, and extensive cleaning of exam room while observing appropriate contact time as indicated for disinfecting solutions.  Subjective:     Patient ID: Gabriella Acosta , female    DOB: December 25, 1945 , 74 y.o.   MRN: [id number]   Chief Complaint  Patient presents with  . Hypertension    HPI  Here for htn followup. She is also having problems with feeling like she is going to pass out. Occurred initially while working at [employer].  It has happened two times in last month.  Each time her blood pressure was elevated at 198/98. Her blood sugar has been increased which she attributes to her increase in stress. She will only have dizziness during that time.  She states "she has stopped drinking alcohol".   BP Readings from Last 3 Encounters: 10/20/19 : 136/70 07/30/19 : 124/70 07/30/19 : 124/70   Hypertension This is a chronic problem. The current episode started more than 1 year ago. The problem has been gradually improving since onset. Condition status: better controlled. Pertinent negatives include no chest pain or palpitations.  Diabetes She presents for her follow-up diabetic visit. She has type 2 diabetes mellitus. Her disease course has been worsening. Hypoglycemia symptoms include dizziness. There are no diabetic associated symptoms. Pertinent negatives for diabetes include no chest pain, no polydipsia, no polyphagia and no polyuria. She is following a generally unhealthy diet. When asked about meal planning, she reported none. She has not had a previous visit with a dietitian. She never participates in  exercise. (230's - 400's) An ACE inhibitor/angiotensin II receptor blocker is being taken. She does not see a podiatrist.Eye exam is not current (she has not had a diabetic eye exam).     Past Medical History:  Diagnosis Date  . Hypertension      Family History  Problem Relation Age of Onset  . Diabetes Mother   . Diabetes Father   . Heart disease Father      Current Outpatient Medications:  .  amLODipine (NORVASC) 5 MG tablet, TAKE 1 TABLET(5 MG) BY MOUTH DAILY, Disp: 90 tablet, Rfl: 0 .  atorvastatin (LIPITOR) 80 MG tablet, Take 1 tablet (80 mg total) by mouth daily., Disp: 90 tablet, Rfl: 1 .  Blood Glucose Monitoring Suppl DEVI, Check blood sugars twice daily. E11.9, Disp: 1 kit, Rfl: 0 .  Continuous Blood Gluc Sensor (FREESTYLE LIBRE 2 SENSOR) MISC, Use to check blood sugars daily, Disp: 1 each, Rfl: 5 .  diclofenac sodium (VOLTAREN) 1 % GEL, Apply 2 g topically 4 (four) times daily. (Patient not taking: Reported on 12/10/2018), Disp: 100 g, Rfl: 1 .  fluconazole (DIFLUCAN) 100 MG tablet, Take 1 tablet by mouth today then repeat in 5 days., Disp: 2 tablet, Rfl: 0 .  glucose blood (ACCU-CHEK GUIDE) test strip, Use as instructed, Disp: 100 each, Rfl: 12 .  insulin aspart (FIASP FLEXTOUCH) 100 UNIT/ML FlexTouch Pen, Inject into the skin. Use as directed per sliding scale three times daily, Disp: , Rfl:  .  Insulin Glargine (BASAGLAR KWIKPEN) 100 UNIT/ML, Inject 30 Units into the skin at bedtime., Disp: , Rfl:  .  Insulin Pen  Needle (PEN NEEDLES) 32G X 4 MM MISC, 1 Stick by Does not apply route daily., Disp: 30 each, Rfl: 2 .  Lancets (ACCU-CHEK SOFT TOUCH) lancets, Use as instructed, Disp: 100 each, Rfl: 12 .  magnesium 30 MG tablet, Take 30 mg by mouth once a week., Disp: , Rfl:  .  meloxicam (MOBIC) 15 MG tablet, TK 1 T PO QD AS NEEDED WITH FOOD (Patient not taking: Reported on 08/04/2019), Disp: , Rfl:  .  niacin (NIASPAN) 500 MG CR tablet, Take 1 tablet (500 mg total) by mouth at  bedtime., Disp: 30 tablet, Rfl: 3 .  potassium chloride (KLOR-CON 10) 10 MEQ tablet, Take 1 tablet (10 mEq total) by mouth daily., Disp: 90 tablet, Rfl: 1 .  sitaGLIPtin (JANUVIA) 100 MG tablet, Take 1 tablet (100 mg total) by mouth daily., Disp: 30 tablet, Rfl: 0 .  telmisartan (MICARDIS) 20 MG tablet, TAKE 1 TABLET(20 MG) BY MOUTH DAILY, Disp: 90 tablet, Rfl: 1   No Known Allergies   Review of Systems  Constitutional: Negative.   Respiratory: Negative.   Cardiovascular: Negative.  Negative for chest pain, palpitations and leg swelling.  Endocrine: Negative for polydipsia, polyphagia and polyuria.  Neurological: Positive for dizziness and light-headedness.     Today's Vitals   10/20/19 0918  BP: 136/70  Pulse: 86  Temp: 98 F (36.7 C)  TempSrc: Oral  Weight: 125 lb 12.8 oz (57.1 kg)  Height: 5' 3.8" (1.621 m)   Body mass index is 21.73 kg/m.   Objective:  Physical Exam Vitals reviewed.  Constitutional:      General: She is not in acute distress.    Appearance: Normal appearance.  Cardiovascular:     Rate and Rhythm: Normal rate and regular rhythm.     Pulses: Normal pulses.     Heart sounds: Normal heart sounds. No murmur heard.   Pulmonary:     Effort: Pulmonary effort is normal. No respiratory distress.     Breath sounds: Normal breath sounds. No wheezing.  Skin:    Capillary Refill: Capillary refill takes less than 2 seconds.  Neurological:     General: No focal deficit present.     Mental Status: She is alert and oriented to person, place, and time.     Cranial Nerves: No cranial nerve deficit.     Motor: No weakness.     Coordination: Coordination normal.  Psychiatric:        Mood and Affect: Mood normal.        Behavior: Behavior normal.        Thought Content: Thought content normal.        Judgment: Judgment normal.         Assessment And Plan:     1. Essential hypertension  Chronic, blood pressure is better controlled today  Continue with  current medications.   - CMP14+EGFR  2. Mixed hyperlipidemia  Chronic, controlled  Continue with current medications  3. Hypertriglyceridemia  I have added niacin to her medications she is to take in the evening at bedtime  Discussed the risk of flushing sensation   She also has an appt with Dr. Debara Pickett in January  She is to avoid breads and sweets - niacin (NIASPAN) 500 MG CR tablet; Take 1 tablet (500 mg total) by mouth at bedtime.  Dispense: 30 tablet; Refill: 3  4. Near syncope  Reports a recurrent event, has occurred while driving  EKG done with NSR HR   I will also check  her carotid doppler in the meantime  I have also advised her to be cautious while driving if having these episodes  She is asking to be cleared to go to work however due to these episodes I would not recommend her going back to work. She even mentions during the visits this has happened while at work.  - US Carotid Duplex Bilateral; Future - CBC - TSH - EKG 12-Lead  5. Uncontrolled type 2 diabetes mellitus with hyperglycemia (HCC)  Chronic, poorly controlled  She has received her sensors in the mail this weekend and will have her daughter to come and apply. - CBC - Hemoglobin A1c  6. Need for influenza vaccination  Influenza vaccine administered  Encouraged to take Tylenol as needed for fever or muscle aches. - Flu Vaccine QUAD High Dose(Fluad)    Patient was given opportunity to ask questions. Patient verbalized understanding of the plan and was able to repeat key elements of the plan. All questions were answered to their satisfaction.   Teola Bradley, FNP, have reviewed all documentation for this visit. The documentation on 10/20/19 for the exam, diagnosis, procedures, and orders are all accurate and complete.  THE PATIENT IS ENCOURAGED TO PRACTICE SOCIAL DISTANCING DUE TO THE COVID-19 PANDEMIC.

## 2019-10-20 NOTE — Patient Instructions (Signed)

## 2019-10-21 LAB — CMP14+EGFR
ALT: 21 IU/L (ref 0–32)
AST: 18 IU/L (ref 0–40)
Albumin/Globulin Ratio: 1.4 (ref 1.2–2.2)
Albumin: 4.5 g/dL (ref 3.7–4.7)
Alkaline Phosphatase: 185 IU/L — ABNORMAL HIGH (ref 44–121)
BUN/Creatinine Ratio: 22 (ref 12–28)
BUN: 15 mg/dL (ref 8–27)
Bilirubin Total: 0.3 mg/dL (ref 0.0–1.2)
CO2: 26 mmol/L (ref 20–29)
Calcium: 10 mg/dL (ref 8.7–10.3)
Chloride: 95 mmol/L — ABNORMAL LOW (ref 96–106)
Creatinine, Ser: 0.67 mg/dL (ref 0.57–1.00)
GFR calc Af Amer: 102 mL/min/{1.73_m2} (ref >59)
GFR calc non Af Amer: 88 mL/min/{1.73_m2} (ref >59)
Globulin, Total: 3.2 g/dL (ref 1.5–4.5)
Glucose: 382 mg/dL — ABNORMAL HIGH (ref 65–99)
Potassium: 3.3 mmol/L — ABNORMAL LOW (ref 3.5–5.2)
Sodium: 135 mmol/L (ref 134–144)
Total Protein: 7.7 g/dL (ref 6.0–8.5)

## 2019-10-21 LAB — TSH: TSH: 1.65 u[IU]/mL (ref 0.450–4.500)

## 2019-10-21 LAB — CBC
Hematocrit: 42.2 % (ref 34.0–46.6)
Hemoglobin: 13.5 g/dL (ref 11.1–15.9)
MCH: 26.9 pg (ref 26.6–33.0)
MCHC: 32 g/dL (ref 31.5–35.7)
MCV: 84 fL (ref 79–97)
Platelets: 132 10*3/uL — ABNORMAL LOW (ref 150–450)
RBC: 5.02 x10E6/uL (ref 3.77–5.28)
RDW: 13.7 % (ref 11.7–15.4)
WBC: 12.8 10*3/uL — ABNORMAL HIGH (ref 3.4–10.8)

## 2019-10-21 LAB — HEMOGLOBIN A1C
Est. average glucose Bld gHb Est-mCnc: 355 mg/dL
Hgb A1c MFr Bld: 14 % — ABNORMAL HIGH (ref 4.8–5.6)

## 2019-10-21 NOTE — Chronic Care Management (AMB) (Signed)
Chronic Care Management   Follow Up Note   10/16/2019 Name: Jacqulin Brandenburger MRN: [id number] DOB: [birth year]  Referred by: [name], FNP Reason for referral : Chronic Care Management (CCM RNCM FU Call )   Eun Vermeer is a 74 y.o. year old female who is a primary care patient of [name], FNP. The CCM team was consulted for assistance with chronic disease management and care coordination needs.    Review of patient status, including review of consultants reports, relevant laboratory and other test results, and collaboration with appropriate care team members and the patient's provider was performed as part of comprehensive patient evaluation and provision of chronic care management services.    SDOH (Social Determinants of Health) assessments performed: No See Care Plan activities for detailed interventions related to SDOH)   Inbound call received from patient concerning c/o having near syncope episodes.     Outpatient Encounter Medications as of 10/16/2019  Medication Sig Note  . amLODipine (NORVASC) 5 MG tablet TAKE 1 TABLET(5 MG) BY MOUTH DAILY   . atorvastatin (LIPITOR) 80 MG tablet Take 1 tablet (80 mg total) by mouth daily.   . Blood Glucose Monitoring Suppl DEVI Check blood sugars twice daily. E11.9   . Continuous Blood Gluc Sensor (FREESTYLE LIBRE 2 SENSOR) MISC Use to check blood sugars daily   . diclofenac sodium (VOLTAREN) 1 % GEL Apply 2 g topically 4 (four) times daily. (Patient not taking: Reported on 12/10/2018)   . fluconazole (DIFLUCAN) 100 MG tablet Take 1 tablet by mouth today then repeat in 5 days.   [name] glucose blood (ACCU-CHEK GUIDE) test strip Use as instructed   . insulin aspart (FIASP FLEXTOUCH) 100 UNIT/ML FlexTouch Pen Inject into the skin. Use as directed per sliding scale three times daily   . Insulin Glargine (BASAGLAR KWIKPEN) 100 UNIT/ML Inject 30 Units into the skin at bedtime. 03/31/2019: [employer] patient assistance  . Insulin Pen Needle  (PEN NEEDLES) 32G X 4 MM MISC 1 Stick by Does not apply route daily.   . Lancets (ACCU-CHEK SOFT TOUCH) lancets Use as instructed   . magnesium 30 MG tablet Take 30 mg by mouth once a week.   . meloxicam (MOBIC) 15 MG tablet TK 1 T PO QD AS NEEDED WITH FOOD (Patient not taking: Reported on 08/04/2019)   . potassium chloride (KLOR-CON 10) 10 MEQ tablet Take 1 tablet (10 mEq total) by mouth daily.   . sitaGLIPtin (JANUVIA) 100 MG tablet Take 1 tablet (100 mg total) by mouth daily.   [name] telmisartan (MICARDIS) 20 MG tablet TAKE 1 TABLET(20 MG) BY MOUTH DAILY    No facility-administered encounter medications on file as of 10/16/2019.     Objective:  Lab Results  Component Value Date   HGBA1C 14.0 (H) 10/20/2019   HGBA1C 10.4 (H) 07/30/2019   HGBA1C 13.4 (H) 02/04/2019   Lab Results  Component Value Date   MICROALBUR 30 12/10/2018   LDLCALC Comment (A) 07/30/2019   CREATININE 0.67 10/20/2019   BP Readings from Last 3 Encounters:  10/20/19 136/70  07/30/19 124/70  07/30/19 124/70    Goals Addressed      Patient Stated   .  "I am having some dizzy spells" (pt-stated)        CARE PLAN ENTRY (see longitudinal plan of care for additional care plan information)  Current Barriers:  [name] Knowledge Deficits related to evaluation and treatment of patient reported near syncope episodes  . Chronic Disease Management support and  education needs related to DM II, HTN, Mixed hyperlipidemia  Nurse Case Manager Clinical Goal(s):  [name] Over the next 30 days, patient will verbalize understanding of plan for evaluation and treatment of near syncope spells . Over the next 90 days, patient will attend all scheduled medical appointments: as scheduled with Dr. Rennis Golden for evaluation and treatment of Mixed hyperlipidemia/Triglyceridemia    CCM RN CM Interventions:  10/16/19 call completed with patient  . Inter-disciplinary care team collaboration (see longitudinal plan of care) . Evaluation of current  treatment plan related to syncope and patient's adherence to plan as established by provider . Determined patient has been experiencing episodes of feeling like she is going to pass out. Patient reports episodes occurred initially while working at [employer], reports it has happened two times in last month.  Determined each time her blood pressure was elevated at 198/98. Determined her blood sugar has been increased which she attributes to her increase in stress. Determined she will only have dizziness during that time but has not actually lost consciousness . Collaborated with PCP [name] FNP regarding patient's reports of recurrent near syncope episodes  . Discussed plans with patient for ongoing care management follow up and provided patient with direct contact information for care management team . Reviewed scheduled/upcoming provider appointments including: f/u OV scheduled with PCP [name], FNP scheduled for 10/20/19 @ 9:15 AM for evaluation of the near syncope episodes  Patient Self Care Activities:  . Self administers medications as prescribed . Attends all scheduled provider appointments . Calls pharmacy for medication refills . Calls provider office for new concerns or questions  Initial goal documentation       Plan:   Telephone follow up appointment with care management team member scheduled for:11/06/19  Delsa Sale, RN, BSN, CCM Care Management Coordinator [facility] Care Management/[facility]  Direct Phone: [phone]

## 2019-10-21 NOTE — Patient Instructions (Signed)
Visit Information  Goals Addressed      Patient Stated   .  "I am having some dizzy spells" (pt-stated)        CARE PLAN ENTRY (see longitudinal plan of care for additional care plan information)  Current Barriers:  [name] Knowledge Deficits related to evaluation and treatment of patient reported near syncope episodes  . Chronic Disease Management support and education needs related to DM II, HTN, Mixed hyperlipidemia  Nurse Case Manager Clinical Goal(s):  [name] Over the next 30 days, patient will verbalize understanding of plan for evaluation and treatment of near syncope spells . Over the next 90 days, patient will attend all scheduled medical appointments: as scheduled with Dr. Rennis Golden for evaluation and treatment of Mixed hyperlipidemia/Triglyceridemia    CCM RN CM Interventions:  10/16/19 call completed with patient  . Inter-disciplinary care team collaboration (see longitudinal plan of care) . Evaluation of current treatment plan related to syncope and patient's adherence to plan as established by provider . Determined patient has been experiencing episodes of feeling like she is going to pass out. Patient reports episodes occurred initially while working at [employer], reports it has happened two times in last month.  Determined each time her blood pressure was elevated at 198/98. Determined her blood sugar has been increased which she attributes to her increase in stress. Determined she will only have dizziness during that time but has not actually lost consciousness . Collaborated with PCP [name] FNP regarding patient's reports of recurrent near syncope episodes  . Discussed plans with patient for ongoing care management follow up and provided patient with direct contact information for care management team . Reviewed scheduled/upcoming provider appointments including: f/u OV scheduled with PCP [name], FNP scheduled for 10/20/19 @ 9:15 AM for evaluation of the near syncope  episodes  Patient Self Care Activities:  . Self administers medications as prescribed . Attends all scheduled provider appointments . Calls pharmacy for medication refills . Calls provider office for new concerns or questions  Initial goal documentation       Patient verbalizes understanding of instructions provided today.   Telephone follow up appointment with care management team member scheduled for: 11/06/19  Delsa Sale, RN, BSN, CCM Care Management Coordinator [facility] Care Management/[facility]  Direct Phone: [phone]

## 2019-10-22 ENCOUNTER — Ambulatory Visit: Payer: Medicare PPO | Admitting: Pharmacist

## 2019-10-22 DIAGNOSIS — E1165 Type 2 diabetes mellitus with hyperglycemia: Secondary | ICD-10-CM

## 2019-10-22 DIAGNOSIS — E782 Mixed hyperlipidemia: Secondary | ICD-10-CM

## 2019-10-22 NOTE — Patient Instructions (Addendum)
Visit Information  Goals Addressed            This Visit's Progress   . Pharmacy Care Plan       CARE PLAN ENTRY (see longitudinal plan of care for additional care plan information)  Current Barriers:  . Chronic Disease Management support, education, and care coordination needs related to Hypertension, Hyperlipidemia, and Diabetes   Hypertension BP Readings from Last 3 Encounters:  10/20/19 136/70  07/30/19 124/70  07/30/19 124/70   . Pharmacist Clinical Goal(s): o Over the next 180 days, patient will work with PharmD and providers to maintain BP goal <130/80 . Current regimen:  . Amlodipine 5mg  daily . Telmisartan 20mg  daily . Interventions: o Provided dietary and exercise recommendations o Check blood pressure when light headed or dizzy . Patient self care activities - Over the next 180 days, patient will: o Check BP 3-5 times weekly, document, and provide at future appointments o Ensure daily salt intake < 2300 mg/day o Exercise for 30 minutes daily, 5 times weekly  Hyperlipidemia Lab Results  Component Value Date/Time   LDLCALC Comment (A) 07/30/2019 03:33 PM   . Pharmacist Clinical Goal(s): o Over the next 30 days, patient will work with PharmD and providers to achieve LDL goal < 70 . Current regimen:  o Atorvastatin 80mg  daily Interventions: o Provided dietary and exercise recommendations o Discussed cholesterol levels and provided patient education o Recommended Niacin suggested by to be picked up OTC . Patient self care activities - Over the next 90 days, patient will: o Exercise for 30 minutes daily, 5 times weekly o Limit fried foods, fast food, and junk food   Diabetes Lab Results  Component Value Date/Time   HGBA1C 14.0 (H) 10/20/2019 10:22 AM   HGBA1C 10.4 (H) 07/30/2019 03:33 PM   . Pharmacist Clinical Goal(s): o Over the next 30 days, patient will work with PharmD and providers to achieve A1c goal <7% . Current regimen:   . Basaglar Kwikpen 30 units at bedtime . Januvia 100mg  daily . Fiasp three times daily per sliding scale . Interventions: o Provided dietary and exercise recommendations o Provided patient education about importance of blood sugar control o She now has Freestyle Libre to use to check her sugars o PAP started for [name] . Patient self care activities - Over the next 30 days, patient will: o Check blood sugar twice daily, document, and provide at future appointments o Contact provider with any episodes of hypoglycemia o Exercise 10-15 minutes daily 3 times weekly and work towards goal of 30 minutes 5 times weekly  o Resume Fiasp insulin per sliding scale directions (below)   Diabetic Sliding Scale (I)  IF BLOOD SUGAR IS:  LESS THAN 100 ( NO INSULIN)  100-140 (2 UNITS)  141-180 (4 UNITS)  181-220 (6 UNITS)  221-260 (8 UNITS)  261-300 (10 UNITS)  301-340 (12 UNITS)  MORE THAN 341 UNITS (14 UNITS)  o Check blood sugar before breakfast, lunch, and dinner and then administer appropriate amount of insulin based on directions at the start of meal or within the first 20 minutes of starting meal  Medication management . Pharmacist Clinical Goal(s): o Over the next 90 days, patient will work with PharmD and providers to achieve optimal medication adherence . Current pharmacy: [facility] . Interventions o Comprehensive medication review performed. o Utilize [facility] pharmacy for medication synchronization, packaging and delivery . Patient self care activities - Over the next 90 days, patient will: o Focus on medication adherence  by using a pill box to organize medications o Take medications as prescribed o Report any questions or concerns to PharmD and/or provider(s)  Please see past updates related to this goal by clicking on the "Past Updates" button in the selected goal         The patient verbalized understanding of instructions provided today and agreed to receive a  mailed copy of patient instruction and/or educational materials.  Telephone follow up appointment with pharmacy team member scheduled for: 4 weeks  Willa Frater, PharmD Clinical Pharmacist [facility] [phone]   Hyperglycemia Hyperglycemia is when the sugar (glucose) level in your blood is too high. It may not cause symptoms. If you do have symptoms, they may include warning signs, such as:  Feeling more thirsty than normal.  Hunger.  Feeling tired.  Needing to pee (urinate) more than normal.  Blurry eyesight (vision). You may get other symptoms as it gets worse, such as:  Dry mouth.  Not being hungry (loss of appetite).  Fruity-smelling breath.  Weakness.  Weight gain or loss that is not planned. Weight loss may be fast.  A tingling or numb feeling in your hands or feet.  Headache.  Skin that does not bounce back quickly when it is lightly pinched and released (poor skin turgor).  Pain in your belly (abdomen).  Cuts or bruises that heal slowly. High blood sugar can happen to people who do or do not have diabetes. High blood sugar can happen slowly or quickly, and it can be an emergency. Follow these instructions at home: General instructions  Take over-the-counter and prescription medicines only as told by your doctor.  Do not use products that contain nicotine or tobacco, such as cigarettes and e-cigarettes. If you need help quitting, ask your doctor.  Limit alcohol intake to no more than 1 drink per day for nonpregnant women and 2 drinks per day for men. One drink equals 12 oz of beer, 5 oz of wine, or 1 oz of hard liquor.  Manage stress. If you need help with this, ask your doctor.  Keep all follow-up visits as told by your doctor. This is important. Eating and drinking   Stay at a healthy weight.  Exercise regularly, as told by your doctor.  Drink enough fluid, especially when you: ? Exercise. ? Get sick. ? Are in hot  temperatures.  Eat healthy foods, such as: ? Low-fat (lean) proteins. ? Complex carbs (complex carbohydrates), such as whole wheat bread or brown rice. ? Fresh fruits and vegetables. ? Low-fat dairy products. ? Healthy fats.  Drink enough fluid to keep your pee (urine) clear or pale yellow. If you have diabetes:   Make sure you know the symptoms of hyperglycemia.  Follow your diabetes management plan, as told by your doctor. Make sure you: ? Take insulin and medicines as told. ? Follow your exercise plan. ? Follow your meal plan. Eat on time. Do not skip meals. ? Check your blood sugar as often as told. Make sure to check before and after exercise. If you exercise longer or in a different way than you normally do, check your blood sugar more often. ? Follow your sick day plan whenever you cannot eat or drink normally. Make this plan ahead of time with your doctor.  Share your diabetes management plan with people in your workplace, school, and household.  Check your urine for ketones when you are ill and as told by your doctor.  Carry a card  or wear jewelry that says that you have diabetes. Contact a doctor if:  Your blood sugar level is higher than 240 mg/dL (16.0 mmol/L) for 2 days in a row.  You have problems keeping your blood sugar in your target range.  High blood sugar happens often for you. Get help right away if:  You have trouble breathing.  You have a change in how you think, feel, or act (mental status).  You feel sick to your stomach (nauseous), and that feeling does not go away.  You cannot stop throwing up (vomiting). These symptoms may be an emergency. Do not wait to see if the symptoms will go away. Get medical help right away. Call your local emergency services (911 in the [place] Do not drive yourself to the hospital. Summary  Hyperglycemia is when the sugar (glucose) level in your blood is too high.  High blood sugar can happen to people who do or do  not have diabetes.  Make sure you drink enough fluids, eat healthy foods, and exercise regularly.  Contact your doctor if you have problems keeping your blood sugar in your target range. This information is not intended to replace advice given to you by your health care provider. Make sure you discuss any questions you have with your health care provider. Document Revised: 09/13/2015 Document Reviewed: 09/13/2015 Elsevier Patient Education  2020 [employer].

## 2019-10-22 NOTE — Chronic Care Management (AMB) (Signed)
Chronic Care Management Pharmacy  Name: Gabriella Acosta  MRN: [id number] DOB: 1945/09/14  Chief Complaint/ HPI  Gabriella Acosta,  74 y.o. , female presents for their Follow-Up CCM visit with the clinical pharmacist via telephone due to COVID-19 Pandemic.  PCP : [name], FNP  Their chronic conditions include: Hypertension, Hyperlipidemia, Diabetes and Anxiety  Office Visits:  10/20/19 OV: Severely elevated A1c at 14.0.  No medication changes made as of this time.  She is not participating in exercise and not following any sort of diet plan.  07/30/19 AWV and OV: HTN/DM follow up. Does not check BG at home. Refused colonoscopy and Cologuard. Interested in pneumonia vaccine. Referred to Ophthalmology. Labs ordered (HgbA1c, CMP14+EGFR, CBC, TB, lipid panel). Encouraged exercise. Continue current meds. Given prescription for PCV13. Atorvastatin increased from 65m daily to 882mdaily.   Consult Visits:  CCM Encounters: 03/27/19 PharmD: Samples of Januvia provided on 3/22. Attestation form mailed back without application so unable to process, incoming pharmacy team to take over. Rosuvastatin switched to atorvastatin due to cost.   02/18/19 CPhT: Confirmed [employer] received patient application for Januvia patient assistance. Waiting on attestation form that was mailed to patient on 02/03/19. Per PharmD, pt has mailed attestation form back to [facility] in 10-14 days  02/17/19 PharmD: Medicaid assistance denied. Assisted patient with PAP applications for Januvia and Basaglar.   02/11/19 CPhT: Basaglar patient assistance approved through [employer] 01/09/20  Medications: Outpatient Encounter Medications as of 10/22/2019  Medication Sig Note  . amLODipine (NORVASC) 5 MG tablet TAKE 1 TABLET(5 MG) BY MOUTH DAILY   . atorvastatin (LIPITOR) 80 MG tablet Take 1 tablet (80 mg total) by mouth daily.   . Blood Glucose Monitoring Suppl DEVI Check blood sugars twice daily. E11.9   .  Continuous Blood Gluc Sensor (FREESTYLE LIBRE 2 SENSOR) MISC Use to check blood sugars daily   . diclofenac sodium (VOLTAREN) 1 % GEL Apply 2 g topically 4 (four) times daily.   . fluconazole (DIFLUCAN) 100 MG tablet Take 1 tablet by mouth today then repeat in 5 days.   . Marland Kitchenlucose blood (ACCU-CHEK GUIDE) test strip Use as instructed   . insulin aspart (FIASP FLEXTOUCH) 100 UNIT/ML FlexTouch Pen Inject into the skin. Use as directed per sliding scale three times daily   . Insulin Glargine (BASAGLAR KWIKPEN) 100 UNIT/ML Inject 30 Units into the skin at bedtime. 03/31/2019: [employer] patient assistance  . Insulin Pen Needle (PEN NEEDLES) 32G X 4 MM MISC 1 Stick by Does not apply route daily.   . Lancets (ACCU-CHEK SOFT TOUCH) lancets Use as instructed   . magnesium 30 MG tablet Take 30 mg by mouth once a week.   . meloxicam (MOBIC) 15 MG tablet TK 1 T PO QD AS NEEDED WITH FOOD   . niacin (NIASPAN) 500 MG CR tablet Take 1 tablet (500 mg total) by mouth at bedtime.   . potassium chloride (KLOR-CON 10) 10 MEQ tablet Take 1 tablet (10 mEq total) by mouth daily.   . sitaGLIPtin (JANUVIA) 100 MG tablet Take 1 tablet (100 mg total) by mouth daily.   . Marland Kitchenelmisartan (MICARDIS) 20 MG tablet TAKE 1 TABLET(20 MG) BY MOUTH DAILY    No facility-administered encounter medications on file as of 10/22/2019.    Current Diagnosis/Assessment:    Goals Addressed            This Visit's Progress   . Pharmacy Care Plan       CARE  PLAN ENTRY (see longitudinal plan of care for additional care plan information)  Current Barriers:  . Chronic Disease Management support, education, and care coordination needs related to Hypertension, Hyperlipidemia, and Diabetes   Hypertension BP Readings from Last 3 Encounters:  10/20/19 136/70  07/30/19 124/70  07/30/19 124/70   . Pharmacist Clinical Goal(s): o Over the next 180 days, patient will work with PharmD and providers to maintain BP goal <130/80 . Current regimen:    . Amlodipine 28m daily . Telmisartan 295mdaily . Interventions: o Provided dietary and exercise recommendations o Check blood pressure when light headed or dizzy . Patient self care activities - Over the next 180 days, patient will: o Check BP 3-5 times weekly, document, and provide at future appointments o Ensure daily salt intake < 2300 mg/day o Exercise for 30 minutes daily, 5 times weekly  Hyperlipidemia Lab Results  Component Value Date/Time   LDLCALC Comment (A) 07/30/2019 03:33 PM   . Pharmacist Clinical Goal(s): o Over the next 30 days, patient will work with PharmD and providers to achieve LDL goal < 70 . Current regimen:  o Atorvastatin 8077maily Interventions: o Provided dietary and exercise recommendations o Discussed cholesterol levels and provided patient education o Recommended Niacin suggested by JanMinette Brine be picked up OTC . Patient self care activities - Over the next 90 days, patient will: o Exercise for 30 minutes daily, 5 times weekly o Limit fried foods, fast food, and junk food   Diabetes Lab Results  Component Value Date/Time   HGBA1C 14.0 (H) 10/20/2019 10:22 AM   HGBA1C 10.4 (H) 07/30/2019 03:33 PM   . Pharmacist Clinical Goal(s): o Over the next 30 days, patient will work with PharmD and providers to achieve A1c goal <7% . Current regimen:  . Basaglar Kwikpen 30 units at bedtime . Januvia 100m72mily . Fiasp three times daily per sliding scale . Interventions: o Provided dietary and exercise recommendations o Provided patient education about importance of blood sugar control o She now has Freestyle Libre to use to check her sugars o PAP started for [employer] self care activities - Over the next 30 days, patient will: o Check blood sugar twice daily, document, and provide at future appointments o Contact provider with any episodes of hypoglycemia o Exercise 10-15 minutes daily 3 times weekly and work towards goal of 30 minutes 5  times weekly  o Resume Fiasp insulin per sliding scale directions (below)   Diabetic Sliding Scale (I)  IF BLOOD SUGAR IS:  LESS THAN 100 ( NO INSULIN)  100-140 (2 UNITS)  141-180 (4 UNITS)  181-220 (6 UNITS)  221-260 (8 UNITS)  261-300 (10 UNITS)  301-340 (12 UNITS)  MORE THAN 341 UNITS (14 UNITS)  o Check blood sugar before breakfast, lunch, and dinner and then administer appropriate amount of insulin based on directions at the start of meal or within the first 20 minutes of starting meal  Medication management . Pharmacist Clinical Goal(s): o Over the next 90 days, patient will work with PharmD and providers to achieve optimal medication adherence . Current pharmacy: [facility] . Interventions o Comprehensive medication review performed. o Utilize [facility] pharmacy for medication synchronization, packaging and delivery . Patient self care activities - Over the next 90 days, patient will: o Focus on medication adherence by using a pill box to organize medications o Take medications as prescribed o Report any questions or concerns to PharmD and/or provider(s)  Please see past updates related to  this goal by clicking on the "Past Updates" button in the selected goal         Diabetes   A1c goal <7%  Recent Relevant Labs: Lab Results  Component Value Date/Time   HGBA1C 14.0 (H) 10/20/2019 10:22 AM   HGBA1C 10.4 (H) 07/30/2019 03:33 PM   MICROALBUR 30 12/10/2018 05:09 PM   MICROALBUR 150 10/31/2017 12:47 PM   Kidney Function Lab Results  Component Value Date/Time   CREATININE 0.67 10/20/2019 10:22 AM   CREATININE 0.59 07/30/2019 03:33 PM   GFRNONAA 88 10/20/2019 10:22 AM   GFRAA 102 10/20/2019 10:22 AM   K 3.3 (L) 10/20/2019 10:22 AM   K 3.5 07/30/2019 03:33 PM   Last diabetic Eye exam:  Lab Results  Component Value Date/Time   HMDIABEYEEXA No Retinopathy 09/02/2019 12:00 AM    Last diabetic Foot exam: 12/10/18  Checking BG: Sometimes  Daily  Recent FBG Readings: unavailable  Patient has failed these meds in past: Tresiba, Tradjenta, Adena  Patient is currently uncontrolled on the following medications: . Basaglar Kwikpen 30 units at bedtime . Januvia 137m daily . Fiasp sliding scale - not currently taking  We discussed:  . Diet extensively o She says she does not eat much, and when she eats it is usually chicken or a hamburger o After further discussion she states she loves bread and eats a lot of it . She has not been checking her blood sugar or using her mealtime insulin because she has been almost out of it and she has not been able to do sliding scale since not checking her sugars o She did report that she has her Freestyle sensor now, just has not put it on and plans to do so today o We discussed her elevated A1c and the need to make changes to her diet including limiting her carbohydrates and sweets . Advised her to check her blood sugars with sensors starting today . Will consult with CCM team to get Fiasp samples and begin PAP so that she is not without insulin . Discussed risk of complications with continued uncontrolled blood sugar.  Plan Continue current medications, Fiasp PAP  Hyperlipidemia   LDL goal < 70  Lipid Panel     Component Value Date/Time   CHOL 257 (H) 07/30/2019 1533   TRIG 863 (HH) 07/30/2019 1533   HDL 34 (L) 07/30/2019 1533   LDLCALC Comment (A) 07/30/2019 1533    Hepatic Function Latest Ref Rng & Units 10/20/2019 07/30/2019 02/04/2019  Total Protein 6.0 - 8.5 g/dL 7.7 8.0 7.3  Albumin 3.7 - 4.7 g/dL 4.5 4.5 4.4  AST 0 - 40 IU/L _0 ALT 0 - 32 IU/L _1 Alk Phosphatase 44 - 121 IU/L 185(H) 179(H) 171(H)  Total Bilirubin 0.0 - 1.2 mg/dL 0.3 0.2 <0.2    The 10-year ASCVD risk score (Mikey BussingDC Jr., et al., 2013) is: 47.7%   Values used to calculate the score:     Age: [age]years     Sex: Female     Is Non-Hispanic African American: No     Diabetic: Yes     Tobacco  smoker: Yes     Systolic Blood Pressure: 1657mmHg     Is BP treated: Yes     HDL Cholesterol: 34 mg/dL     Total Cholesterol: 257 mg/dL   Patient has failed these meds in past: Rosuvastatin Patient is currently uncontrolled on the following medications:  . Atorvastatin  34m daily  . Niacin CR 5099m(NEW) - has not picked up from pharmacy yet  We discussed:  Her TG's are severely elevated, she has not picked up the Niacin recommended yet.  Encouraged her to pick this up.  We discussed diet and risk of elevated LDL and TG's.  Plan Continue current medications, recheck lipid panel    ChBeverly MilchPharmD Clinical Pharmacist [facility] ([phone]

## 2019-10-30 ENCOUNTER — Other Ambulatory Visit: Payer: Self-pay

## 2019-10-30 ENCOUNTER — Ambulatory Visit
Admission: RE | Admit: 2019-10-30 | Discharge: 2019-10-30 | Disposition: A | Discharge status: Home / Self Care | Payer: Medicare PPO | Source: Ambulatory Visit | Attending: Nurse Practitioner | Admitting: Nurse Practitioner

## 2019-10-30 DIAGNOSIS — E781 Pure hyperglyceridemia: Secondary | ICD-10-CM | POA: Diagnosis not present

## 2019-10-30 DIAGNOSIS — R42 Dizziness and giddiness: Secondary | ICD-10-CM | POA: Diagnosis not present

## 2019-10-30 DIAGNOSIS — I6523 Occlusion and stenosis of bilateral carotid arteries: Secondary | ICD-10-CM | POA: Diagnosis not present

## 2019-10-30 DIAGNOSIS — R55 Syncope and collapse: Secondary | ICD-10-CM

## 2019-10-31 ENCOUNTER — Other Ambulatory Visit: Payer: Self-pay | Admitting: Nurse Practitioner

## 2019-10-31 NOTE — Progress Notes (Signed)
I have already made the referral to Cardiology Dr. Rennis Golden for your cholesterol levels

## 2019-11-06 ENCOUNTER — Telehealth: Payer: Medicare PPO

## 2019-11-07 ENCOUNTER — Other Ambulatory Visit: Payer: Self-pay | Admitting: Nurse Practitioner

## 2019-11-10 ENCOUNTER — Other Ambulatory Visit: Payer: Self-pay

## 2019-11-10 MED ORDER — BD PEN NEEDLE MINI U/F 31G X 5 MM MISC: 3 refills | Status: DC

## 2019-11-11 ENCOUNTER — Other Ambulatory Visit: Payer: Self-pay

## 2019-11-11 ENCOUNTER — Other Ambulatory Visit: Payer: Self-pay | Admitting: Nurse Practitioner

## 2019-11-11 ENCOUNTER — Ambulatory Visit: Payer: Self-pay

## 2019-11-11 ENCOUNTER — Telehealth: Payer: Medicare PPO

## 2019-11-11 DIAGNOSIS — R748 Abnormal levels of other serum enzymes: Secondary | ICD-10-CM

## 2019-11-11 DIAGNOSIS — E1165 Type 2 diabetes mellitus with hyperglycemia: Secondary | ICD-10-CM

## 2019-11-11 DIAGNOSIS — E782 Mixed hyperlipidemia: Secondary | ICD-10-CM

## 2019-11-11 DIAGNOSIS — I10 Essential (primary) hypertension: Secondary | ICD-10-CM

## 2019-11-12 NOTE — Patient Instructions (Signed)
Visit Information  Goals Addressed      Patient Stated   .  "to get my diabetes under better control" (pt-stated)   Not on track     CARE PLAN ENTRY (see longitudinal plan of care for additional care plan information)  Current Barriers:  [name] Knowledge Deficits related to disease process and Self Health Management of Diabetes . Chronic Disease Management support and education needs related to type 2 Diabetes, HTN, Hyperlipidemia  Nurse Case Manager Clinical Goal(s):  [name] Over the next 90 days, patient will work with CCM team and PCP to address needs related to disease education and support for improved Self Health management of DM  CCM RN CM Interventions:  11/11/19 call completed with patient  . Inter-disciplinary care team collaboration (see longitudinal plan of care) . Evaluation of current treatment plan related to Diabetes and patient's adherence to plan as established by provider. [name] Re-educated patient re: current A1c has increased to 14.0%; Re-Educated on target A1c <7.0; Re-Educated on dietary and exercise recommendations; Re-Educated on daily glycemic control FBS 80-130; <180 after meals; Re-Educated on 15'15' rule  . Determined patient now has the [facility] and is monitoring and recording her CBG's 3-4 times daily, average readings are above target range . Determined patient is newly established with the embedded Pharm D to help manage her DM . Reviewed medications with patient and discussed patient is adhering to taking her prescribed medications w/o noted SE or missed doses:  Basaglar Kwikpen 30 units at bedtime  Januvia 100mg  daily  Fiasp three times daily per sliding scale . Reinforced the importance of medication adherence w/o missed doses . Confirmed patient received the mailed educational booklet related to Living Well with Diabetes  . Advised patient, providing education and rationale, to check cbg 2-3 daily before meals and record, calling the CCM team and PCP for  findings outside established parameters . Discussed plans with patient for ongoing care management follow up and provided patient with direct contact information for care management team  Patient Self Care Activities:  . Self administers medications as prescribed . Attends all scheduled provider appointments . Calls pharmacy for medication refills . Calls provider office for new concerns or questions  Please see past updates related to this goal by clicking on the "Past Updates" button in the selected goal        Other   .  to evaluate abnormally high WBC/Alkaline Phosphatase        CARE PLAN ENTRY (see longitudinal plan of care for additional care plan information)  Current Barriers:  Knowledge Deficits related to evaluation and treatment of elevated WBC and Alkaline Phosphatase with unknown etiology  . Chronic Disease Management support and education needs related to DM II, HTN, Mixed hyperlipidemia . [name].   Nurse Case Manager Clinical Goal(s):  Corporate treasurer Over the next 90 days, patient will work with the CCM team and PCP to address needs related to evaluation and treatment of elevated WBC and elevated Alkaline Phosphatase levels  CCM RN CM Interventions:  11/11/19 call completed with patient  . Inter-disciplinary care team collaboration (see longitudinal plan of care) . Evaluation of current treatment plan related to abnormal labs and patient's adherence to plan as established by provider. . Provided education to patient re: potential cause and risk for having an elevated WBC and Alkaline Phosphatase; Educated patient on s/s of infection, including UTI and upper respiratory; Educated on importance of notifying the PCP promptly is symptoms occur for early treatment .  Determined and discussed with patient PCP recommendations for an abdominal US to rule out potential cause for elevated labs, patient is agreeable  . Collaborated with PCP regarding patient is agreeable to  follow PCP recommendations for an abdominal US to evaluate cause for abnormal labs  . Discussed plans with patient for ongoing care management follow up and provided patient with direct contact information for care management team 11/12/19 call completed with patient  . Informed patient PCP placed referral with [facility] for an abdominal US . Advised patient she should get a call from [facility] to schedule this procedure, patient verbalizes understanding  . Discussed plans with patient for ongoing care management follow up and provided patient with direct contact information for care management team  Patient Self Care Activities:  . Self administers medications as prescribed . Attends all scheduled provider appointments . Calls pharmacy for medication refills . Calls provider office for new concerns or questions  Initial goal documentation        Patient verbalizes understanding of instructions provided today.   Telephone follow up appointment with care management team member scheduled for: 12/18/19  Delsa Sale, RN, BSN, CCM Care Management Coordinator [facility] Care Management/[facility]  Direct Phone: [phone]

## 2019-11-12 NOTE — Chronic Care Management (AMB) (Signed)
Chronic Care Management   Follow Up Note   11/11/2019 Name: Gabriella Acosta MRN: [id number] DOB: 06/27/1945  Referred by: [name], FNP Reason for referral : Chronic Care Management (CCM RNCM FU Call )   Gabriella Acosta is a 74 y.o. year old female who is a primary care patient of [name], FNP. The CCM team was consulted for assistance with chronic disease management and care coordination needs.    Review of patient status, including review of consultants reports, relevant laboratory and other test results, and collaboration with appropriate care team members and the patient's provider was performed as part of comprehensive patient evaluation and provision of chronic care management services.    SDOH (Social Determinants of Health) assessments performed: Yes - no acute challenges identified See Care Plan activities for detailed interventions related to SDOH)   Placed CCM RN CM follow up call to patient for a care plan update.     Outpatient Encounter Medications as of 11/11/2019  Medication Sig Note  . amLODipine (NORVASC) 5 MG tablet TAKE 1 TABLET(5 MG) BY MOUTH DAILY   . atorvastatin (LIPITOR) 80 MG tablet Take 1 tablet (80 mg total) by mouth daily.   . Blood Glucose Monitoring Suppl DEVI Check blood sugars twice daily. E11.9   . Continuous Blood Gluc Sensor (FREESTYLE LIBRE 2 SENSOR) MISC Use to check blood sugars daily   . diclofenac sodium (VOLTAREN) 1 % GEL Apply 2 g topically 4 (four) times daily.   . fluconazole (DIFLUCAN) 100 MG tablet Take 1 tablet by mouth today then repeat in 5 days.   [name] glucose blood (ACCU-CHEK GUIDE) test strip Use as instructed   . insulin aspart (FIASP FLEXTOUCH) 100 UNIT/ML FlexTouch Pen Inject into the skin. Use as directed per sliding scale three times daily   . Insulin Glargine (BASAGLAR KWIKPEN) 100 UNIT/ML Inject 30 Units into the skin at bedtime. 03/31/2019: [employer] patient assistance  . Insulin Pen Needle (B-D UF III MINI PEN  NEEDLES) 31G X 5 MM MISC USE AS DIRECTED DAILY   . Lancets (ACCU-CHEK SOFT TOUCH) lancets Use as instructed   . magnesium 30 MG tablet Take 30 mg by mouth once a week.   . meloxicam (MOBIC) 15 MG tablet TK 1 T PO QD AS NEEDED WITH FOOD   . niacin (NIASPAN) 500 MG CR tablet Take 1 tablet (500 mg total) by mouth at bedtime.   . potassium chloride (KLOR-CON 10) 10 MEQ tablet Take 1 tablet (10 mEq total) by mouth daily.   . sitaGLIPtin (JANUVIA) 100 MG tablet Take 1 tablet (100 mg total) by mouth daily.   [name] telmisartan (MICARDIS) 20 MG tablet TAKE 1 TABLET(20 MG) BY MOUTH DAILY    No facility-administered encounter medications on file as of 11/11/2019.     Objective:  Lab Results  Component Value Date   HGBA1C 14.0 (H) 10/20/2019   HGBA1C 10.4 (H) 07/30/2019   HGBA1C 13.4 (H) 02/04/2019   Lab Results  Component Value Date   MICROALBUR 30 12/10/2018   LDLCALC Comment (A) 07/30/2019   CREATININE 0.67 10/20/2019   BP Readings from Last 3 Encounters:  10/20/19 136/70  07/30/19 124/70  07/30/19 124/70    Goals Addressed      Patient Stated   .  "to get my diabetes under better control" (pt-stated)   Not on track     CARE PLAN ENTRY (see longitudinal plan of care for additional care plan information)  Current Barriers:  [name] Knowledge Deficits  related to disease process and Self Health Management of Diabetes . Chronic Disease Management support and education needs related to type 2 Diabetes, HTN, Hyperlipidemia  Nurse Case Manager Clinical Goal(s):  [name] Over the next 90 days, patient will work with CCM team and PCP to address needs related to disease education and support for improved Self Health management of DM  CCM RN CM Interventions:  11/11/19 call completed with patient  . Inter-disciplinary care team collaboration (see longitudinal plan of care) . Evaluation of current treatment plan related to Diabetes and patient's adherence to plan as established by provider. [name] Re-educated  patient re: current A1c has increased to 14.0%; Re-Educated on target A1c <7.0; Re-Educated on dietary and exercise recommendations; Re-Educated on daily glycemic control FBS 80-130; <180 after meals; Re-Educated on 15'15' rule  . Determined patient now has the [facility] and is monitoring and recording her CBG's 3-4 times daily, average readings are above target range . Determined patient is newly established with the embedded Pharm D to help manage her DM . Reviewed medications with patient and discussed patient is adhering to taking her prescribed medications w/o noted SE or missed doses:  Basaglar Kwikpen 30 units at bedtime  Januvia 100mg  daily  Fiasp three times daily per sliding scale . Reinforced the importance of medication adherence w/o missed doses . Confirmed patient received the mailed educational booklet related to Living Well with Diabetes  . Advised patient, providing education and rationale, to check cbg 2-3 daily before meals and record, calling the CCM team and PCP for findings outside established parameters . Discussed plans with patient for ongoing care management follow up and provided patient with direct contact information for care management team  Patient Self Care Activities:  . Self administers medications as prescribed . Attends all scheduled provider appointments . Calls pharmacy for medication refills . Calls provider office for new concerns or questions  Please see past updates related to this goal by clicking on the "Past Updates" button in the selected goal        Other   .  to evaluate abnormally high WBC/Alkaline Phosphatase        CARE PLAN ENTRY (see longitudinal plan of care for additional care plan information)  Current Barriers:  Knowledge Deficits related to evaluation and treatment of elevated WBC and Alkaline Phosphatase with unknown etiology  . Chronic Disease Management support and education needs related to DM II, HTN, Mixed  hyperlipidemia . [name].   Nurse Case Manager Clinical Goal(s):  Corporate treasurer Over the next 90 days, patient will work with the CCM team and PCP to address needs related to evaluation and treatment of elevated WBC and elevated Alkaline Phosphatase levels  CCM RN CM Interventions:  11/11/19 call completed with patient  . Inter-disciplinary care team collaboration (see longitudinal plan of care) . Evaluation of current treatment plan related to abnormal labs and patient's adherence to plan as established by provider. . Provided education to patient re: potential cause and risk for having an elevated WBC and Alkaline Phosphatase; Educated patient on s/s of infection, including UTI and upper respiratory; Educated on importance of notifying the PCP promptly is symptoms occur for early treatment . Determined and discussed with patient PCP recommendations for an abdominal 13/2/21 to rule out potential cause for elevated labs, patient is agreeable  . Collaborated with PCP regarding patient is agreeable to follow PCP recommendations for an abdominal US to evaluate cause for abnormal labs  . Discussed plans with patient  for ongoing care management follow up and provided patient with direct contact information for care management team 11/12/19 call completed with patient  . Informed patient PCP placed referral with [facility] for an abdominal US . Advised patient she should get a call from [facility] to schedule this procedure, patient verbalizes understanding  . Discussed plans with patient for ongoing care management follow up and provided patient with direct contact information for care management team  Patient Self Care Activities:  . Self administers medications as prescribed . Attends all scheduled provider appointments . Calls pharmacy for medication refills . Calls provider office for new concerns or questions  Initial goal documentation       Plan:   Telephone follow up  appointment with care management team member scheduled for: 12/18/19  Delsa Sale, RN, BSN, CCM Care Management Coordinator [facility] Care Management/[facility]  Direct Phone: [phone]

## 2019-11-14 ENCOUNTER — Telehealth: Payer: Self-pay | Admitting: Pharmacist

## 2019-11-14 NOTE — Chronic Care Management (AMB) (Signed)
Chronic Care Management Pharmacy Assistant   Name: Karolyna Bianchini  MRN: [id number] DOB: 02-May-1945  Reason for Encounter: Patient Assistance Coordination  PCP : [name], FNP   11/14/2019- Called patient to get updated income information for [place] patient assistance application with [employer].   11/27/2019- Left message for patient to return call regarding income and SS#. Patient returned call. Provided appropriate information. Form filled out, signed by provider and patient, still awaiting income documentation. Patient also informed that she needs a renewal on her Basaglar patient assistance medication with [employer]. Patient aware she may come to the office next week to sign forms.   11/28/2019- Patient assistance form with [employer] filled out for [employer], awaiting provider signature, patient signature and income documentation.  11/28/2019- [facility] sent notification that before delivery of medications today, patient states she is unable to afford her medications for 90 days or even 30 days.  Called patient she informed me that she was in a donut hole and is not working. She is out of her Telmisartan and is not able to afford it. 3 month supply is $72 and 30 day supply $24. Patient states she is unable to do that right now. Informed patient that I will relay this information to Pharmacist [name] and she will contact PCP to inquire on if lower cost medication is needed or if samples are available. Also informed patient that I will look into patient assistance programs or coupons for this medication.  [name], CPP notified.   Allergies:  No Known Allergies  Medications: Outpatient Encounter Medications as of 11/14/2019  Medication Sig Note  . amLODipine (NORVASC) 5 MG tablet TAKE 1 TABLET(5 MG) BY MOUTH DAILY   . atorvastatin (LIPITOR) 80 MG tablet Take 1 tablet (80 mg total) by mouth daily.   . Blood Glucose Monitoring Suppl DEVI Check blood  sugars twice daily. E11.9   . Continuous Blood Gluc Sensor (FREESTYLE LIBRE 2 SENSOR) MISC Use to check blood sugars daily   . diclofenac sodium (VOLTAREN) 1 % GEL Apply 2 g topically 4 (four) times daily.   . fluconazole (DIFLUCAN) 100 MG tablet Take 1 tablet by mouth today then repeat in 5 days.   [name] glucose blood (ACCU-CHEK GUIDE) test strip Use as instructed   . insulin aspart (FIASP FLEXTOUCH) 100 UNIT/ML FlexTouch Pen Inject into the skin. Use as directed per sliding scale three times daily   . Insulin Glargine (BASAGLAR KWIKPEN) 100 UNIT/ML Inject 30 Units into the skin at bedtime. 03/31/2019: [employer] patient assistance  . Insulin Pen Needle (B-D UF III MINI PEN NEEDLES) 31G X 5 MM MISC USE AS DIRECTED DAILY   . Lancets (ACCU-CHEK SOFT TOUCH) lancets Use as instructed   . magnesium 30 MG tablet Take 30 mg by mouth once a week.   . meloxicam (MOBIC) 15 MG tablet TK 1 T PO QD AS NEEDED WITH FOOD   . niacin (NIASPAN) 500 MG CR tablet Take 1 tablet (500 mg total) by mouth at bedtime.   . potassium chloride (KLOR-CON 10) 10 MEQ tablet Take 1 tablet (10 mEq total) by mouth daily.   . sitaGLIPtin (JANUVIA) 100 MG tablet Take 1 tablet (100 mg total) by mouth daily.   [name] telmisartan (MICARDIS) 20 MG tablet TAKE 1 TABLET(20 MG) BY MOUTH DAILY    No facility-administered encounter medications on file as of 11/14/2019.    Current Diagnosis: Patient Active Problem List   Diagnosis Date Noted  . Cognitive  deficits 11/07/2018  . ETOH abuse 07/30/2013  . Anxiety 07/30/2013  . Essential hypertension 01/31/2011    Follow-Up:  Patient Assistance Coordination and Pharmacist Review  11/28/2019- Contacted [employer] patient assistance program, spoke with Danella Deis, she informed me that their program allows patient to get medications at a low cost. For Telmisartan 20 mg #30 would be $16, #90 would be $35 and 6 month supply would be $60. Informed that patient is in a donut hole and would not be able to afford.    Patient aware of the research so far, she is aware PCP has been notified, awaiting response, informed patient to monitor her blood pressures and pulse until further information is provided. Patient states she is not out of any other medications and she would be able to afford her Telmisartan in 2 weeks. Patient aware to contact me as soon as she notices any changes in health. [name], CPP aware.   12/09/19- Mailing patient assistance forms to patient to supply income documentation for [employer] and signature for [employer].  Billee Cashing, CMA Clinical Pharmacist Assistant [phone]

## 2019-11-15 DIAGNOSIS — E1165 Type 2 diabetes mellitus with hyperglycemia: Secondary | ICD-10-CM | POA: Diagnosis not present

## 2019-11-20 ENCOUNTER — Telehealth: Payer: Self-pay

## 2019-11-20 ENCOUNTER — Telehealth: Payer: Medicare PPO

## 2019-11-20 NOTE — Telephone Encounter (Signed)
  Chronic Care Management   Outreach Note  11/20/2019 Name: Gabriella Acosta MRN: [id number] DOB: 26-Feb-1945  Referred by: [name], FNP Reason for referral : Care Coordination   An unsuccessful telephone outreach was attempted today. The patient was referred to the case management team for assistance with care management and care coordination. Unfortunately, the patients voice mailbox was full which prohibited SW from leaving a HIPAA compliant voice message requesting a return call.  Follow Up Plan: The care management team will reach out to the patient again over the next 28 days.   Bevelyn Ngo, BSW, CDP Social Worker, Certified Dementia Practitioner [facility] Care Management [phone]

## 2019-11-24 ENCOUNTER — Telehealth: Payer: Self-pay

## 2019-11-24 NOTE — Chronic Care Management (AMB) (Signed)
Chronic Care Management Pharmacy Assistant   Name: Gabriella Acosta  MRN: [id number] DOB: Mar 12, 1945  Reason for Encounter: Medication Review  Patient Questions:  1.  Have you seen any other providers since your last visit? Yes, 11/02/2021Delsa Sale, RN (CCM)., 10/22/2019- [name], FNP (PCP).  2.  Any changes in your medicines or health? Yes- Niacin ER 500 mg- 1 tablet daily added 10/22/2019.  PCP : [name], FNP  Allergies:  No Known Allergies  Medications: Outpatient Encounter Medications as of 11/24/2019  Medication Sig Note  . amLODipine (NORVASC) 5 MG tablet TAKE 1 TABLET(5 MG) BY MOUTH DAILY   . atorvastatin (LIPITOR) 80 MG tablet Take 1 tablet (80 mg total) by mouth daily.   . Blood Glucose Monitoring Suppl DEVI Check blood sugars twice daily. E11.9   . Continuous Blood Gluc Sensor (FREESTYLE LIBRE 2 SENSOR) MISC Use to check blood sugars daily   . diclofenac sodium (VOLTAREN) 1 % GEL Apply 2 g topically 4 (four) times daily.   . fluconazole (DIFLUCAN) 100 MG tablet Take 1 tablet by mouth today then repeat in 5 days.   [name] glucose blood (ACCU-CHEK GUIDE) test strip Use as instructed   . insulin aspart (FIASP FLEXTOUCH) 100 UNIT/ML FlexTouch Pen Inject into the skin. Use as directed per sliding scale three times daily   . Insulin Glargine (BASAGLAR KWIKPEN) 100 UNIT/ML Inject 30 Units into the skin at bedtime. 03/31/2019: [employer] patient assistance  . Insulin Pen Needle (B-D UF III MINI PEN NEEDLES) 31G X 5 MM MISC USE AS DIRECTED DAILY   . Lancets (ACCU-CHEK SOFT TOUCH) lancets Use as instructed   . magnesium 30 MG tablet Take 30 mg by mouth once a week.   . meloxicam (MOBIC) 15 MG tablet TK 1 T PO QD AS NEEDED WITH FOOD   . niacin (NIASPAN) 500 MG CR tablet Take 1 tablet (500 mg total) by mouth at bedtime.   . potassium chloride (KLOR-CON 10) 10 MEQ tablet Take 1 tablet (10 mEq total) by mouth daily.   . sitaGLIPtin (JANUVIA) 100 MG tablet Take 1 tablet (100 mg  total) by mouth daily.   [name] telmisartan (MICARDIS) 20 MG tablet TAKE 1 TABLET(20 MG) BY MOUTH DAILY    No facility-administered encounter medications on file as of 11/24/2019.    Current Diagnosis: Patient Active Problem List   Diagnosis Date Noted  . Cognitive deficits 11/07/2018  . ETOH abuse 07/30/2013  . Anxiety 07/30/2013  . Essential hypertension 01/31/2011   Reviewed chart for medication changes ahead of medication coordination call.  OVs- 11/11/2019- Delsa Sale, RN (CCM)., 10/22/2019- [name], FNP (PCP) since last care coordination call/Pharmacist visit. Medication changes indicated: Niacin ER 500 mg- 1 tablet daily added 10/22/2019.   BP Readings from Last 3 Encounters:  10/20/19 136/70  07/30/19 124/70  07/30/19 124/70    Lab Results  Component Value Date   HGBA1C 14.0 (H) 10/20/2019     Patient obtains medications through Adherence Packaging  90 Days   Last adherence delivery included: None- Patient new to [facility].  Patient is due for next adherence delivery on: 11/28/2019. 11/24/2019- Left message for patient to return call. Patient returned call, reviewed medications and coordinated delivery.  This delivery to include: Atorvastatin 80 mg- 1 tablet daily (before breakfast) Telmisartan 20 mg- 1 tablet daily (before breakfast) Potassium Chloride 10 mg- 1 tablet daily (before breakfast) Niacin Er 500 mg- 1 tablet daily (bedtime)   Patient will need a short  fill of Potassium 10 mg, prior to adherence delivery. Coordinated acute fill for Potassium to be delivered 11/24/2019.  Patient declined the following medications Amlodipine 5 mg due to having 3 weeks left on medications from 09/18/2019 fill from [facility] for #90.   No refills needed.  Confirmed delivery date of 11/192021, advised patient that pharmacy will contact them the morning of delivery.  Follow-Up:  Coordination of Enhanced Pharmacy Services and Pharmacist Review  Patient states  she was out of Potassium and is running low on Telmisartan, 5 days left on medication. Patient aware pharmacy will bring out a few pills of potassium until we can get packaging sent on 11/28/2019, delivery date moved few days forward to due Telmisartan supply. Acute form placed for 12/17/2019 delivery for Amlodipine short supply until next med sync date 02/2020. [name] notified.  Billee Cashing, CMA Clinical Pharmacist Assistant [phone]

## 2019-11-26 ENCOUNTER — Telehealth: Payer: Self-pay

## 2019-11-26 NOTE — Chronic Care Management (AMB) (Signed)
°  Chronic Care Management   Outreach Note  11/26/2019 Name: Gabriella Acosta MRN: [id number] DOB: October 08, 1945  Referred by: [name], FNP Reason for referral : No chief complaint on file.   An unsuccessful telephone outreach was attempted today. The patient was referred to the pharmacist for assistance with care management and care coordination.  Tried to reach out to patient today, there was no answer. Left a HIPAA compliant voicemail.   Follow Up Plan:   Contacted scheduler to reschedule patient   [name], PharmD Clinical Pharmacist [facility] [phone]

## 2019-11-27 ENCOUNTER — Telehealth: Payer: Self-pay | Admitting: *Deleted

## 2019-11-27 ENCOUNTER — Other Ambulatory Visit: Payer: Self-pay | Admitting: Nurse Practitioner

## 2019-11-27 DIAGNOSIS — E781 Pure hyperglyceridemia: Secondary | ICD-10-CM

## 2019-11-27 NOTE — Chronic Care Management (AMB) (Signed)
  Care Management   Note  11/27/2019 Name: Gabriella Acosta MRN: [id number] DOB: Aug 09, 1945  Gabriella Acosta is a 74 y.o. year old female who is a primary care patient of [name], FNP and is actively engaged with the care management team. I reached out to Barrie Dunker by phone today to assist with re-scheduling a follow up visit with the Pharmacist  Follow up plan: Unsuccessful telephone outreach attempt made. A HIPAA compliant phone message was left for the patient providing contact information and requesting a return call.  The care management team will reach out to the patient again over the next 7 days.  If patient returns call to provider office, please advise to call Embedded Care Management Care Guide Gwenevere Ghazi at [phone].  Gwenevere Ghazi  Care Guide, Embedded Care Coordination [facility] Management

## 2019-12-03 NOTE — Chronic Care Management (AMB) (Signed)
  Care Management   Note  12/03/2019 Name: Aaryn Parrilla MRN: [id number] DOB: [birth year]  Prosperity Darrough is a 74 y.o. year old female who is a primary care patient of [name], FNP and is actively engaged with the care management team. I reached out to Barrie Dunker by phone today to assist with re-scheduling a follow up visit with the Pharmacist  Follow up plan: Telephone appointment with care management team member scheduled for:01/20/2020  [facility] Guide, Embedded Care Coordination [facility] Management

## 2019-12-11 ENCOUNTER — Telehealth: Payer: Self-pay

## 2019-12-11 NOTE — Chronic Care Management (AMB) (Signed)
    Chronic Care Management Pharmacy Assistant   Name: Gabriella Acosta  MRN: [id number] DOB: 03/09/1945  Reason for Encounter: Patient Assistance Coordination  PCP : [name], FNP   12/11/2019- Jon Gills, CPA completed a medication cost review on patient's Telmisartan with [facility]. Patient's Telmisartan is a Tier 2 medications and after review Losartan and Valsartan was determined to be aTier 1 medication to where patient may get for a lower pice. Message sent to PCP regarding alternative medications. Waiting on response.           Allergies:  No Known Allergies  Medications: Outpatient Encounter Medications as of 12/11/2019  Medication Sig Note  . amLODipine (NORVASC) 5 MG tablet TAKE 1 TABLET(5 MG) BY MOUTH DAILY   . atorvastatin (LIPITOR) 80 MG tablet Take 1 tablet (80 mg total) by mouth daily.   . Blood Glucose Monitoring Suppl DEVI Check blood sugars twice daily. E11.9   . Continuous Blood Gluc Sensor (FREESTYLE LIBRE 2 SENSOR) MISC Use to check blood sugars daily   . diclofenac sodium (VOLTAREN) 1 % GEL Apply 2 g topically 4 (four) times daily.   . fluconazole (DIFLUCAN) 100 MG tablet Take 1 tablet by mouth today then repeat in 5 days.   [name] glucose blood (ACCU-CHEK GUIDE) test strip Use as instructed   . insulin aspart (FIASP FLEXTOUCH) 100 UNIT/ML FlexTouch Pen Inject into the skin. Use as directed per sliding scale three times daily   . Insulin Glargine (BASAGLAR KWIKPEN) 100 UNIT/ML Inject 30 Units into the skin at bedtime. 03/31/2019: [employer] patient assistance  . Insulin Pen Needle (B-D UF III MINI PEN NEEDLES) 31G X 5 MM MISC USE AS DIRECTED DAILY   . Lancets (ACCU-CHEK SOFT TOUCH) lancets Use as instructed   . magnesium 30 MG tablet Take 30 mg by mouth once a week.   . meloxicam (MOBIC) 15 MG tablet TK 1 T PO QD AS NEEDED WITH FOOD   . niacin (NIASPAN) 500 MG CR tablet TAKE ONE TABLET BY MOUTH EVERYDAY AT BEDTIME   . potassium chloride (KLOR-CON 10) 10  MEQ tablet Take 1 tablet (10 mEq total) by mouth daily.   . sitaGLIPtin (JANUVIA) 100 MG tablet Take 1 tablet (100 mg total) by mouth daily.   [name] telmisartan (MICARDIS) 20 MG tablet TAKE 1 TABLET(20 MG) BY MOUTH DAILY    No facility-administered encounter medications on file as of 12/11/2019.    Current Diagnosis: Patient Active Problem List   Diagnosis Date Noted  . Cognitive deficits 11/07/2018  . ETOH abuse 07/30/2013  . Anxiety 07/30/2013  . Essential hypertension 01/31/2011     Follow-Up:  Medication Cost Review and Patient Assistance Coordination  Patient has a follow up with visit PCP on 12/16/2019.  12/18/2019- Mailed copy of patient portion of Novo Nordisk patient assistance enrollment form needing a copy of application and to check which one will be provided. Advised to bring back to office so we can fax forms to [employer] for [place] assistance.  12/22/2019- Januvia 100 mg patient assistance application printed out to be filled in.   12/31/2019- Awaiting provider signature, patient signature and income documentation for Januvia with [employer].  [name], CPP notified.  Billee Cashing, CMA Clinical Pharmacist Assistant [phone]

## 2019-12-12 ENCOUNTER — Other Ambulatory Visit: Payer: Self-pay | Admitting: Nurse Practitioner

## 2019-12-15 DIAGNOSIS — E1165 Type 2 diabetes mellitus with hyperglycemia: Secondary | ICD-10-CM | POA: Diagnosis not present

## 2019-12-16 ENCOUNTER — Encounter: Payer: Medicare Other | Admitting: Nurse Practitioner

## 2019-12-16 NOTE — Patient Instructions (Signed)
Health Maintenance, Female Adopting a healthy lifestyle and getting preventive care are important in promoting health and wellness. Ask your health care provider about:  The right schedule for you to have regular tests and exams.  Things you can do on your own to prevent diseases and keep yourself healthy. What should I know about diet, weight, and exercise? Eat a healthy diet   Eat a diet that includes plenty of vegetables, fruits, low-fat dairy products, and lean protein.  Do not eat a lot of foods that are high in solid fats, added sugars, or sodium. Maintain a healthy weight Body mass index (BMI) is used to identify weight problems. It estimates body fat based on height and weight. Your health care provider can help determine your BMI and help you achieve or maintain a healthy weight. Get regular exercise Get regular exercise. This is one of the most important things you can do for your health. Most adults should:  Exercise for at least 150 minutes each week. The exercise should increase your heart rate and make you sweat (moderate-intensity exercise).  Do strengthening exercises at least twice a week. This is in addition to the moderate-intensity exercise.  Spend less time sitting. Even light physical activity can be beneficial. Watch cholesterol and blood lipids Have your blood tested for lipids and cholesterol at 72 years of age, then have this test every 5 years. Have your cholesterol levels checked more often if:  Your lipid or cholesterol levels are high.  You are older than 72 years of age.  You are at high risk for heart disease. What should I know about cancer screening? Depending on your health history and family history, you may need to have cancer screening at various ages. This may include screening for:  Breast cancer.  Cervical cancer.  Colorectal cancer.  Skin cancer.  Lung cancer. What should I know about heart disease, diabetes, and high blood  pressure? Blood pressure and heart disease  High blood pressure causes heart disease and increases the risk of stroke. This is more likely to develop in people who have high blood pressure readings, are of African descent, or are overweight.  Have your blood pressure checked: ? Every 3-5 years if you are 18-39 years of age. ? Every year if you are 40 years old or older. Diabetes Have regular diabetes screenings. This checks your fasting blood sugar level. Have the screening done:  Once every three years after age 40 if you are at a normal weight and have a low risk for diabetes.  More often and at a younger age if you are overweight or have a high risk for diabetes. What should I know about preventing infection? Hepatitis B If you have a higher risk for hepatitis B, you should be screened for this virus. Talk with your health care provider to find out if you are at risk for hepatitis B infection. Hepatitis C Testing is recommended for:  Everyone born from 1945 through 1965.  Anyone with known risk factors for hepatitis C. Sexually transmitted infections (STIs)  Get screened for STIs, including gonorrhea and chlamydia, if: ? You are sexually active and are younger than 72 years of age. ? You are older than 72 years of age and your health care provider tells you that you are at risk for this type of infection. ? Your sexual activity has changed since you were last screened, and you are at increased risk for chlamydia or gonorrhea. Ask your health care provider if   you are at risk.  Ask your health care provider about whether you are at high risk for HIV. Your health care provider may recommend a prescription medicine to help prevent HIV infection. If you choose to take medicine to prevent HIV, you should first get tested for HIV. You should then be tested every 3 months for as long as you are taking the medicine. Pregnancy  If you are about to stop having your period (premenopausal) and  you may become pregnant, seek counseling before you get pregnant.  Take 400 to 800 micrograms (mcg) of folic acid every day if you become pregnant.  Ask for birth control (contraception) if you want to prevent pregnancy. Osteoporosis and menopause Osteoporosis is a disease in which the bones lose minerals and strength with aging. This can result in bone fractures. If you are 65 years old or older, or if you are at risk for osteoporosis and fractures, ask your health care provider if you should:  Be screened for bone loss.  Take a calcium or vitamin D supplement to lower your risk of fractures.  Be given hormone replacement therapy (HRT) to treat symptoms of menopause. Follow these instructions at home: Lifestyle  Do not use any products that contain nicotine or tobacco, such as cigarettes, e-cigarettes, and chewing tobacco. If you need help quitting, ask your health care provider.  Do not use street drugs.  Do not share needles.  Ask your health care provider for help if you need support or information about quitting drugs. Alcohol use  Do not drink alcohol if: ? Your health care provider tells you not to drink. ? You are pregnant, may be pregnant, or are planning to become pregnant.  If you drink alcohol: ? Limit how much you use to 0-1 drink a day. ? Limit intake if you are breastfeeding.  Be aware of how much alcohol is in your drink. In the [place] one drink equals one 12 oz bottle of beer (355 mL), one 5 oz glass of wine (148 mL), or one 1 oz glass of hard liquor (44 mL). General instructions  Schedule regular health, dental, and eye exams.  Stay current with your vaccines.  Tell your health care provider if: ? You often feel depressed. ? You have ever been abused or do not feel safe at home. Summary  Adopting a healthy lifestyle and getting preventive care are important in promoting health and wellness.  Follow your health care provider's instructions about healthy  diet, exercising, and getting tested or screened for diseases.  Follow your health care provider's instructions on monitoring your cholesterol and blood pressure. This information is not intended to replace advice given to you by your health care provider. Make sure you discuss any questions you have with your health care provider. Document Revised: 12/19/2017 Document Reviewed: 12/19/2017 Elsevier Patient Education  2020 [employer] Inc[employer]  

## 2019-12-17 ENCOUNTER — Telehealth: Payer: Medicare PPO

## 2019-12-17 ENCOUNTER — Telehealth: Payer: Self-pay

## 2019-12-17 NOTE — Telephone Encounter (Signed)
  Chronic Care Management   Outreach Note  12/17/2019 Name: Gabriella Acosta MRN: [id number] DOB: 08/03/1945  Referred by: [name], FNP Reason for referral : Care Coordination   Gabriella Acosta is enrolled in a Managed Medicaid Health Plan: No  A second unsuccessful telephone outreach was attempted today. The patient was referred to the case management team for assistance with care management and care coordination.   Follow Up Plan: The care management team will reach out to the patient again over the next 30 days.   Bevelyn Ngo, BSW, CDP Social Worker, Certified Dementia Practitioner [facility] Care Management [phone]

## 2019-12-18 ENCOUNTER — Telehealth: Payer: Medicare PPO

## 2019-12-24 ENCOUNTER — Other Ambulatory Visit: Payer: Self-pay | Admitting: Nurse Practitioner

## 2019-12-24 MED ORDER — LOSARTAN POTASSIUM 50 MG PO TABS: 50.0000 mg | ORAL_TABLET | Freq: Every day | ORAL | 2 refills | Status: DC

## 2019-12-25 ENCOUNTER — Telehealth: Payer: Self-pay

## 2019-12-25 NOTE — Progress Notes (Signed)
Chronic Care Management Pharmacy Assistant   Name: Orchid Glassberg  MRN: [id number] DOB: 03-17-1945  Reason for Encounter: Medication Review  PCP : [name], FNP  Allergies:  No Known Allergies  Medications: Outpatient Encounter Medications as of 12/25/2019  Medication Sig Note  . amLODipine (NORVASC) 5 MG tablet TAKE 1 TABLET(5 MG) BY MOUTH DAILY   . atorvastatin (LIPITOR) 80 MG tablet Take 1 tablet (80 mg total) by mouth daily.   . Blood Glucose Monitoring Suppl DEVI Check blood sugars twice daily. E11.9   . Continuous Blood Gluc Sensor (FREESTYLE LIBRE 2 SENSOR) MISC Use to check blood sugars daily   . diclofenac sodium (VOLTAREN) 1 % GEL Apply 2 g topically 4 (four) times daily.   . fluconazole (DIFLUCAN) 100 MG tablet Take 1 tablet by mouth today then repeat in 5 days.   [name] glucose blood (ACCU-CHEK GUIDE) test strip Use as instructed   . insulin aspart (FIASP FLEXTOUCH) 100 UNIT/ML FlexTouch Pen Inject into the skin. Use as directed per sliding scale three times daily   . Insulin Glargine (BASAGLAR KWIKPEN) 100 UNIT/ML Inject 30 Units into the skin at bedtime. 03/31/2019: [employer] patient assistance  . Insulin Pen Needle (B-D UF III MINI PEN NEEDLES) 31G X 5 MM MISC USE AS DIRECTED DAILY   . Lancets (ACCU-CHEK SOFT TOUCH) lancets Use as instructed   . losartan (COZAAR) 50 MG tablet Take 1 tablet (50 mg total) by mouth daily.   . magnesium 30 MG tablet Take 30 mg by mouth once a week.   . meloxicam (MOBIC) 15 MG tablet TK 1 T PO QD AS NEEDED WITH FOOD   . niacin (NIASPAN) 500 MG CR tablet TAKE ONE TABLET BY MOUTH EVERYDAY AT BEDTIME   . potassium chloride (KLOR-CON 10) 10 MEQ tablet Take 1 tablet (10 mEq total) by mouth daily.   . sitaGLIPtin (JANUVIA) 100 MG tablet Take 1 tablet (100 mg total) by mouth daily.    No facility-administered encounter medications on file as of 12/25/2019.    Current Diagnosis: Patient Active Problem List   Diagnosis Date Noted  .  Cognitive deficits 11/07/2018  . ETOH abuse 07/30/2013  . Anxiety 07/30/2013  . Essential hypertension 01/31/2011     Follow-Up:  Pharmacist Review   Reviewed chart for medication changes ahead of medication coordination call.  OVs, Consults, or hospital visits since last care coordination call/Pharmacist visit.   12/16/19 PCP [name]  Medication changes indicated  12/24/19 PCP started  Losartan 50MG  Tablet and discontinued telmisartan (patient states she is unaware of this change). BP Readings from Last 3 Encounters:  10/20/19 136/70  07/30/19 124/70  07/30/19 124/70    Lab Results  Component Value Date   HGBA1C 14.0 (H) 10/20/2019     Patient obtains medications through Adherence Packaging  90 Days   Last adherence delivery included:  Atorvastatin 80 mg- 1 tablet daily (before breakfast) Telmisartan 20 mg- 1 tablet daily (before breakfast) Potassium Chloride 10 mg- 1 tablet daily (before breakfast) Niacin Er 500 mg- 1 tablet daily (bedtime)   Patient declined (meds) last month due to PRN use/additional supply on hand.  Amlodipine 5 mg due to having 3 weeks left on medications from 09/18/2019 fill from [facility] for #90.   Patient is due for next adherence delivery on: No Fill Needed . Called patient and reviewed medications and coordinated delivery.  This delivery to include:None ID    Patient declined the following medications (meds) due to (reason)  Atorvastatin 80 mg- 1 tablet daily (before breakfast) -Adequate supply   Telmisartan 20 mg- 1 tablet daily (before breakfast)-Adequate supply   Potassium Chloride 10 mg- 1 tablet daily (before breakfast)-Adequate supply   Niacin Er 500 mg- 1 tablet daily (bedtime)-Adequate supply   Amlodipine 5 mg-Adequate supply   Patient needs refills for None ID .  Confirmed delivery date of No Fill Needed , advised patient that pharmacy will contact them the morning of delivery.  Everlean Cherry Clinical Pharmacist  Assistant [phone]

## 2020-01-15 ENCOUNTER — Telehealth: Payer: Medicare PPO

## 2020-01-15 DIAGNOSIS — E1165 Type 2 diabetes mellitus with hyperglycemia: Secondary | ICD-10-CM | POA: Diagnosis not present

## 2020-01-16 ENCOUNTER — Telehealth: Payer: Self-pay

## 2020-01-16 NOTE — Chronic Care Management (AMB) (Signed)
Chronic Care Management Pharmacy Assistant   Name: Gabriella Acosta  MRN: [id number] DOB: Gabriella Acosta, Gabriella Acosta  Reason for Encounter: Medication Review  Patient Questions:c 1.  Have you seen any other providers since your last visit? No  2.  Any changes in your medicines or health? Yes, Telmisartan 20 mg discontinued on 12/24/2019, started Losartan 50 mg one tablet daily.    PCP : [name], FNP  Allergies:  No Known Allergies  Medications: Outpatient Encounter Medications as of 01/16/2020  Medication Sig Note   amLODipine (NORVASC) 5 MG tablet TAKE 1 TABLET(5 MG) BY MOUTH DAILY    atorvastatin (LIPITOR) 80 MG tablet Take 1 tablet (80 mg total) by mouth daily.    Blood Glucose Monitoring Suppl DEVI Check blood sugars twice daily. E11.9    Continuous Blood Gluc Sensor (FREESTYLE LIBRE 2 SENSOR) MISC Use to check blood sugars daily    diclofenac sodium (VOLTAREN) 1 % GEL Apply 2 g topically 4 (four) times daily.    fluconazole (DIFLUCAN) 100 MG tablet Take 1 tablet by mouth today then repeat in 5 days.    glucose blood (ACCU-CHEK GUIDE) test strip Use as instructed    insulin aspart (FIASP FLEXTOUCH) 100 UNIT/ML FlexTouch Pen Inject into the skin. Use as directed per sliding scale three times daily    Insulin Glargine (BASAGLAR KWIKPEN) 100 UNIT/ML Inject Acosta Units into the skin at bedtime. 03/31/2019: [employer] patient assistance   Insulin Pen Needle (B-D UF III MINI PEN NEEDLES) 31G X 5 MM MISC USE AS DIRECTED DAILY    Lancets (ACCU-CHEK SOFT TOUCH) lancets Use as instructed    losartan (COZAAR) 50 MG tablet Take 1 tablet (50 mg total) by mouth daily.    magnesium Acosta MG tablet Take Acosta mg by mouth once a week.    meloxicam (MOBIC) 15 MG tablet TK 1 T PO QD AS NEEDED WITH FOOD    niacin (NIASPAN) 500 MG CR tablet TAKE ONE TABLET BY MOUTH EVERYDAY AT BEDTIME    potassium chloride (KLOR-CON 10) 10 MEQ tablet Take 1 tablet (10 mEq total) by mouth daily.    sitaGLIPtin (JANUVIA)  100 MG tablet Take 1 tablet (100 mg total) by mouth daily.    No facility-administered encounter medications on file as of 01/16/2020.    Current Diagnosis: Patient Active Problem List   Diagnosis Date Noted   Cognitive deficits 11/07/2018   ETOH abuse 07/30/2013   Anxiety 07/30/2013   Essential hypertension 01/31/2011   Reviewed chart for medication changes ahead of medication coordination call.  No OVs, Consults, or hospital visits since last care coordination call/Pharmacist visit  Medication changes indicated: Telmisartan 20 mg discontinued on 12/24/2019, started Losartan 50 mg one tablet daily  BP Readings from Last 3 Encounters:  10/20/19 136/70  07/30/19 124/70  07/30/19 124/70    Lab Results  Component Value Date   HGBA1C 14.0 (H) 10/20/2019     Patient obtains medications through Adherence Packaging  90 Days   Last adherence delivery included: No refills needed per last adherence call on 12/25/2019.   Patient declined the following medications last month: Atorvastatin 80 mg- 1 tablet daily (before breakfast) - last filled on 11/27/2019 90DS Telmisartan 20 mg- 1 tablet daily (before breakfast) - last filled on 11/27/2019 90DS   Potassium Chloride 10 mg- 1 tablet daily (before breakfast) - last filled on 11/27/2019 90DS Niacin Er 500 mg- 1 tablet daily (bedtime) - last filled on 11/23//2021 30DS Amlodipine 5 mg- 1 tablet daily (  before breakfast) - last filled on 12/16/2019 71DS   Patient is due for next adherence delivery on: 01/23/2020. 01/16/2020- Called patient to review medications and coordinated delivery, no answer, left message to return call. 01/20/2020- Patient had a follow up visit with [name], CPP, medications were reviewed and delivery coordinated.   This delivery to include: Potassium Chloride 10 mg- 1 tablet daily (before breakfast) Atorvastatin 80 mg- 1 tablet daily (before breakfast)  Losartan 50 mg- 1 tablet daily (before  breakfast)   No short fill or acute fill need.  Patient declined the following medications: Telmisartan 20 mg- 1 tablet daily due to being discontinued Niacin Er 500 mg- 1 tablet daily due to receiving  OTC. Amlodipine 5 mg- 1 tablet daily - last filled on 12/16/2019 71DS   Patient needs refills for - None  Confirmed delivery date of 01/23/2020, advised patient that pharmacy will contact them the morning of delivery.  Follow-Up:  Coordination of Enhanced Pharmacy Services, Patient Assistance Coordination and Pharmacist Review   Patient aware that she will need to sign her patient assistance forms for Basaglar, Januvia and Ava and income documentation needed.  [name], CPP notified  Billee Cashing, [facility] Clinical Pharmacist Assistant [phone]

## 2020-01-20 ENCOUNTER — Other Ambulatory Visit: Payer: Self-pay | Admitting: Nurse Practitioner

## 2020-01-20 ENCOUNTER — Ambulatory Visit: Payer: Medicare PPO

## 2020-01-20 DIAGNOSIS — E1165 Type 2 diabetes mellitus with hyperglycemia: Secondary | ICD-10-CM

## 2020-01-20 DIAGNOSIS — I10 Essential (primary) hypertension: Secondary | ICD-10-CM

## 2020-01-20 DIAGNOSIS — E782 Mixed hyperlipidemia: Secondary | ICD-10-CM

## 2020-01-20 MED ORDER — FLUCONAZOLE 100 MG PO TABS: ORAL_TABLET | ORAL | 0 refills | Status: DC
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
## 2020-01-29 ENCOUNTER — Telehealth: Payer: Self-pay

## 2020-01-29 ENCOUNTER — Ambulatory Visit: Payer: Medicare PPO

## 2020-01-29 DIAGNOSIS — E1165 Type 2 diabetes mellitus with hyperglycemia: Secondary | ICD-10-CM

## 2020-01-29 DIAGNOSIS — I10 Essential (primary) hypertension: Secondary | ICD-10-CM

## 2020-01-29 NOTE — Chronic Care Management (AMB) (Signed)
Chronic Care Management Pharmacy Assistant   Name: Gabriella Acosta  MRN: [id number] DOB: Jan 25, 1945  Reason for Encounter: Disease State  Patient Questions:  1.  Have you seen any other providers since your last visit? No  2.  Any changes in your medicines or health? Yes, Fluconazole 100 mg started.  PCP : [name], FNP  Allergies:  No Known Allergies  Medications: Outpatient Encounter Medications as of 01/29/2020  Medication Sig Note   amLODipine (NORVASC) 5 MG tablet TAKE 1 TABLET(5 MG) BY MOUTH DAILY    atorvastatin (LIPITOR) 80 MG tablet Take 1 tablet (80 mg total) by mouth daily.    Blood Glucose Monitoring Suppl DEVI Check blood sugars twice daily. E11.9    Continuous Blood Gluc Sensor (FREESTYLE LIBRE 2 SENSOR) MISC Use to check blood sugars daily    diclofenac sodium (VOLTAREN) 1 % GEL Apply 2 g topically 4 (four) times daily.    fluconazole (DIFLUCAN) 100 MG tablet Take 1 tablet by mouth today then repeat in 5 days.    glucose blood (ACCU-CHEK GUIDE) test strip Use as instructed    insulin aspart (FIASP FLEXTOUCH) 100 UNIT/ML FlexTouch Pen Inject into the skin. Use as directed per sliding scale three times daily    Insulin Glargine (BASAGLAR KWIKPEN) 100 UNIT/ML Inject 30 Units into the skin at bedtime. 03/31/2019: [employer] patient assistance   Insulin Pen Needle (B-D UF III MINI PEN NEEDLES) 31G X 5 MM MISC USE AS DIRECTED DAILY    Lancets (ACCU-CHEK SOFT TOUCH) lancets Use as instructed    losartan (COZAAR) 50 MG tablet Take 1 tablet (50 mg total) by mouth daily.    magnesium 30 MG tablet Take 30 mg by mouth once a week.    meloxicam (MOBIC) 15 MG tablet TK 1 T PO QD AS NEEDED WITH FOOD    niacin (NIASPAN) 500 MG CR tablet TAKE ONE TABLET BY MOUTH EVERYDAY AT BEDTIME    potassium chloride (KLOR-CON 10) 10 MEQ tablet Take 1 tablet (10 mEq total) by mouth daily.    sitaGLIPtin (JANUVIA) 100 MG tablet Take 1 tablet (100 mg total) by mouth daily.     No facility-administered encounter medications on file as of 01/29/2020.    Current Diagnosis: Patient Active Problem List   Diagnosis Date Noted   Cognitive deficits 11/07/2018   ETOH abuse 07/30/2013   Anxiety 07/30/2013   Essential hypertension 01/31/2011   Recent Relevant Labs: Lab Results  Component Value Date/Time   HGBA1C 14.0 (H) 10/20/2019 10:22 AM   HGBA1C 10.4 (H) 07/30/2019 03:33 PM   MICROALBUR 30 12/10/2018 05:09 PM   MICROALBUR 150 10/31/2017 12:47 PM    Kidney Function Lab Results  Component Value Date/Time   CREATININE 0.67 10/20/2019 10:22 AM   CREATININE 0.59 07/30/2019 03:33 PM   GFRNONAA 88 10/20/2019 10:22 AM   GFRAA 102 10/20/2019 10:22 AM     Current antihyperglycemic regimen:  o Basaglar Kwikpen 30 units at bedtime o Januvia 100mg  daily o Fiasp sliding scale  What recent interventions/DTPs have been made to improve glycemic control:  o Patient is checking blood sugars twice daily and has resumed Fiasp.  Have there been any recent hospitalizations or ED visits since last visit with CPP? No  Patient denies hypoglycemic symptoms.  Patient denies hyperglycemic symptoms.  How often are you checking your blood sugar? twice daily  What are your blood sugars ranging?  o Fasting: 189 o Before meals: none o After meals: 235, 223 o  Bedtime: none  During the week, how often does your blood glucose drop below 70? Never  Are you checking your feet daily/regularly? Yes  Adherence Review: Is the patient currently on a STATIN medication? Yes- Atorvastatin 80 mg Is the patient currently on ACE/ARB medication? Yes Does the patient have >5 day gap between last estimated fill dates? No    Goals Addressed            This Visit's Progress    Pharmacy Care Plan   On track    CARE PLAN ENTRY (see longitudinal plan of care for additional care plan information)  Current Barriers:   Chronic Disease Management support, education, and care  coordination needs related to Hypertension, Hyperlipidemia, and Diabetes   Hypertension BP Readings from Last 3 Encounters:  10/20/19 136/70  07/30/19 124/70  07/30/19 124/70    Pharmacist Clinical Goal(s): o Over the next 180 days, patient will work with PharmD and providers to maintain BP goal <130/80  Current regimen:   Amlodipine 5mg  daily  Losartan 50 mg daily   Interventions: o Provided dietary and exercise recommendations o Check blood pressure when light headed or dizzy  Patient self care activities - Over the next 180 days, patient will: o Check BP 3-5 times weekly, document, and provide at future appointments o Ensure daily salt intake < 2300 mg/day o Exercise for 30 minutes daily, 5 times weekly  Hyperlipidemia Lab Results  Component Value Date/Time   LDLCALC Comment (A) 07/30/2019 03:33 PM    Pharmacist Clinical Goal(s): o Over the next 30 days, patient will work with PharmD and providers to achieve LDL goal < 70  Current regimen:  o Atorvastatin 80mg  daily Interventions: o Provided dietary and exercise recommendations o Discussed cholesterol levels and provided patient education o Recommended Niacin suggested by 08/01/2019 to be picked up OTC  Patient self care activities - Over the next 90 days, patient will: o Exercise for 30 minutes daily, 5 times weekly o Limit fried foods, fast food, and junk food   Diabetes Lab Results  Component Value Date/Time   HGBA1C 14.0 (H) 10/20/2019 10:22 AM   HGBA1C 10.4 (H) 07/30/2019 03:33 PM    Pharmacist Clinical Goal(s): o Over the next 30 days, patient will work with PharmD and providers to achieve A1c goal <7%  Current regimen:   Basaglar Kwikpen 30 units at bedtime  Januvia 100mg  daily  Fiasp three times daily per sliding scale  Interventions: o Provided dietary and exercise recommendations o Provided patient education about importance of blood sugar control o She now has 12/20/2019 to use  to check her sugars o Patient to have her 08/01/2019 changed out every 14 days  o CPA will reach out to patient every other week to go over blood sugar readings   Patient self care activities - Over the next 30 days, patient will: o Check blood sugar twice daily, document, and provide at future appointments o Contact provider with any episodes of hypoglycemia o Exercise 10-15 minutes daily 3 times weekly and work towards goal of 30 minutes 5 times weekly o Resume Fiasp insulin per sliding scale directions (below)   Diabetic Sliding Scale (I)  IF BLOOD SUGAR IS:  LESS THAN 100 ( NO INSULIN)  100-140 (2 UNITS)  141-180 (4 UNITS)  181-220 (6 UNITS)  221-260 (8 UNITS)  261-300 (10 UNITS)  301-340 (12 UNITS)  MORE THAN 341 UNITS (14 UNITS)  o Check blood sugar before breakfast, lunch, and dinner  and then administer appropriate amount of insulin based on directions at the start of meal or within the first 20 minutes of starting meal  Medication management  Pharmacist Clinical Goal(s): o Over the next 90 days, patient will work with PharmD and providers to achieve optimal medication adherence  Current pharmacy: [facility]  Interventions o Comprehensive medication review performed. o Utilize [facility] pharmacy for medication synchronization, packaging and delivery  Patient self care activities - Over the next 90 days, patient will: o Focus on medication adherence by using a pill box to organize medications o Take medications as prescribed o Report any questions or concerns to PharmD and/or provider(s)  Please see past updates related to this goal by clicking on the "Past Updates" button in the selected goal         Follow-Up:  Pharmacist Review   Billee Cashing, [facility] Clinical Pharmacist Assistant [phone]

## 2020-01-29 NOTE — Patient Instructions (Signed)
Goals we discussed today:  Goals Addressed    . Patient Stated       Timeframe:  Long-Range Goal Priority:  Low Start Date: 1.20.22                             Expected End Date:  5.20.22                      Next planned outreach: 3.9.22  Patient Goals/Self-Care Activities Over the next 45 days, patient will:   - Patient will self administer medications as prescribed Patient will attend all scheduled provider appointments Patient will call provider office for new concerns or questions Complete and submit Medicaid application Contact SW as needed prior to next scheduled call

## 2020-01-29 NOTE — Chronic Care Management (AMB) (Signed)
Chronic Care Management    Social Work Note  01/29/2020 Name: Gabriella Acosta MRN: [id number] DOB: June 23, 1945  Gabriella Acosta is a 75 y.o. year old female who is a primary care patient of [name], FNP. The CCM team was consulted to assist the patient with chronic disease management and/or care coordination needs related to: [facility] .   Engaged with patient by telephone for follow up visit in response to provider referral for social work chronic care management and care coordination services.   Consent to Services:  The patient was given information about Chronic Care Management services, agreed to services, and gave verbal consent prior to initiation of services.  Please see initial visit note for detailed documentation.   Patient agreed to services and consent obtained.   Assessment: Review of patient past medical history, allergies, medications, and health status, including review of relevant consultants reports was performed today as part of a comprehensive evaluation and provision of chronic care management and care coordination services.     SDOH (Social Determinants of Health) assessments and interventions performed:  No  Advanced Directives Status: Not addressed in this encounter.  CCM Care Plan  No Known Allergies  Outpatient Encounter Medications as of 01/29/2020  Medication Sig Note  . amLODipine (NORVASC) 5 MG tablet TAKE 1 TABLET(5 MG) BY MOUTH DAILY   . atorvastatin (LIPITOR) 80 MG tablet Take 1 tablet (80 mg total) by mouth daily.   . Blood Glucose Monitoring Suppl DEVI Check blood sugars twice daily. E11.9   . Continuous Blood Gluc Sensor (FREESTYLE LIBRE 2 SENSOR) MISC Use to check blood sugars daily   . diclofenac sodium (VOLTAREN) 1 % GEL Apply 2 g topically 4 (four) times daily.   . fluconazole (DIFLUCAN) 100 MG tablet Take 1 tablet by mouth today then repeat in 5 days.   [name] glucose blood (ACCU-CHEK GUIDE) test strip Use as instructed   .  insulin aspart (FIASP FLEXTOUCH) 100 UNIT/ML FlexTouch Pen Inject into the skin. Use as directed per sliding scale three times daily   . Insulin Glargine (BASAGLAR KWIKPEN) 100 UNIT/ML Inject 30 Units into the skin at bedtime. 03/31/2019: [employer] patient assistance  . Insulin Pen Needle (B-D UF III MINI PEN NEEDLES) 31G X 5 MM MISC USE AS DIRECTED DAILY   . Lancets (ACCU-CHEK SOFT TOUCH) lancets Use as instructed   . losartan (COZAAR) 50 MG tablet Take 1 tablet (50 mg total) by mouth daily.   . magnesium 30 MG tablet Take 30 mg by mouth once a week.   . meloxicam (MOBIC) 15 MG tablet TK 1 T PO QD AS NEEDED WITH FOOD   . niacin (NIASPAN) 500 MG CR tablet TAKE ONE TABLET BY MOUTH EVERYDAY AT BEDTIME   . potassium chloride (KLOR-CON 10) 10 MEQ tablet Take 1 tablet (10 mEq total) by mouth daily.   . sitaGLIPtin (JANUVIA) 100 MG tablet Take 1 tablet (100 mg total) by mouth daily.    No facility-administered encounter medications on file as of 01/29/2020.    Patient Active Problem List   Diagnosis Date Noted  . Cognitive deficits 11/07/2018  . ETOH abuse 07/30/2013  . Anxiety 07/30/2013  . Essential hypertension 01/31/2011    Conditions to be addressed/monitored: HTN and DMII; Financial constraints related to medical costs  Care Plan : Social Work [facility] care plan  Updates made by Bevelyn Ngo since 01/29/2020 12:00 AM    Problem: Barriers to Treatment     Long-Range Goal: Barriers to Treatment  Identified and Managed   Start Date: 01/29/2020  Expected End Date: 05/28/2020  Priority: Low  Note:   Current Barriers:  . Financial constraints related to cost of medical care . Limited social support . Chronic conditions including DM II, HTN, and Mixed Hyperlipidemia which put patient at increased risk of hospitalization  Social Work Clinical Goal(s):  [name] Over the next 30 days patient will complete Medicaid application  Interventions: . 1:1 collaboration with [name], FNP regarding  development and update of comprehensive plan of care as evidenced by provider attestation and co-signature . Inter-disciplinary care team collaboration (see longitudinal plan of care) . Successful outbound call placed to the patient to assess for care coordination needs . Determined the patient has yet to receive previous mailed Medicaid applications . Discussed the patient has an OV scheduled for Monday 1.24.22 . Collaboration with primary provider office to provide the patient with a Medicaid application during her OV  Patient Goals/Self-Care Activities Over the next 45 days, patient will:   - Patient will self administer medications as prescribed Patient will attend all scheduled provider appointments Patient will call provider office for new concerns or questions Complete and submit Medicaid application Contact SW as needed prior to next scheduled call  Follow up Plan: SW will follow up with patient by phone over the next two months      Bevelyn Ngo, BSW, CDP Social Worker, Certified Dementia Practitioner [facility] Care Management [phone]  Total time spent performing care coordination and/or care management activities with the patient by phone or face to face = 12 minutes.

## 2020-02-02 ENCOUNTER — Ambulatory Visit: Payer: Medicare PPO | Admitting: Nurse Practitioner

## 2020-02-03 ENCOUNTER — Telehealth: Payer: Self-pay

## 2020-02-03 ENCOUNTER — Telehealth: Payer: Medicare PPO

## 2020-02-03 NOTE — Telephone Encounter (Cosign Needed)
   02/03/2020  Evelena Masci 01/01/1946 [id number]    An unsuccessful telephone outreach was attempted today. I spoke with Ms. Rightmyer briefly today, unfortunately she is not able to speak with me today but would like a call back. She is agreeable to call back at the end of the week after her follow up visit with Dr. Rennis Golden. The patient was referred to the case management team for assistance with care management and care coordination.   Follow Up Plan: Telephone follow up appointment with care management team member scheduled for:03/08/20  Delsa Sale, RN, BSN, CCM Care Management Coordinator [facility] Care Management/[facility]  Direct Phone: [phone]

## 2020-02-05 ENCOUNTER — Ambulatory Visit: Payer: Medicare PPO | Admitting: Internal Medicine

## 2020-02-05 NOTE — Patient Instructions (Signed)
Visit Information  Goals Addressed            This Visit's Progress   . COMPLETED: Pharmacy Care Plan       CARE PLAN ENTRY (see longitudinal plan of care for additional care plan information)  Current Barriers:  . Chronic Disease Management support, education, and care coordination needs related to Hypertension, Hyperlipidemia, and Diabetes   Hypertension BP Readings from Last 3 Encounters:  10/20/19 136/70  07/30/19 124/70  07/30/19 124/70   . Pharmacist Clinical Goal(s): o Over the next 180 days, patient will work with PharmD and providers to maintain BP goal <130/80 . Current regimen:  . Amlodipine 5mg  daily . Losartan 50 mg daily  . Interventions: o Provided dietary and exercise recommendations o Check blood pressure when light headed or dizzy . Patient self care activities - Over the next 180 days, patient will: o Check BP 3-5 times weekly, document, and provide at future appointments o Ensure daily salt intake < 2300 mg/day o Exercise for 30 minutes daily, 5 times weekly  Hyperlipidemia Lab Results  Component Value Date/Time   LDLCALC Comment (A) 07/30/2019 03:33 PM   . Pharmacist Clinical Goal(s): o Over the next 30 days, patient will work with PharmD and providers to achieve LDL goal < 70 . Current regimen:  o Atorvastatin 80mg  daily Interventions: o Provided dietary and exercise recommendations o Discussed cholesterol levels and provided patient education o Recommended Niacin suggested by 08/01/2019 to be picked up OTC . Patient self care activities - Over the next 90 days, patient will: o Exercise for 30 minutes daily, 5 times weekly o Limit fried foods, fast food, and junk food   Diabetes Lab Results  Component Value Date/Time   HGBA1C 14.0 (H) 10/20/2019 10:22 AM   HGBA1C 10.4 (H) 07/30/2019 03:33 PM   . Pharmacist Clinical Goal(s): o Over the next 30 days, patient will work with PharmD and providers to achieve A1c goal <7% . Current regimen:   . Basaglar Kwikpen 30 units at bedtime . Januvia 100mg  daily . Fiasp three times daily per sliding scale . Interventions: o Provided dietary and exercise recommendations o Provided patient education about importance of blood sugar control o She now has Freestyle Libre to use to check her sugars o Patient to have her Freestyle Libre changed out every 14 days  o CPA will reach out to patient every other week to go over blood sugar readings  . Patient self care activities - Over the next 30 days, patient will: o Check blood sugar twice daily, document, and provide at future appointments o Contact provider with any episodes of hypoglycemia o Exercise 10-15 minutes daily 3 times weekly and work towards goal of 30 minutes 5 times weekly o Resume Fiasp insulin per sliding scale directions (below)   Diabetic Sliding Scale (I)  IF BLOOD SUGAR IS:  LESS THAN 100 ( NO INSULIN)  100-140 (2 UNITS)  141-180 (4 UNITS)  181-220 (6 UNITS)  221-260 (8 UNITS)  261-300 (10 UNITS)  301-340 (12 UNITS)  MORE THAN 341 UNITS (14 UNITS)  o Check blood sugar before breakfast, lunch, and dinner and then administer appropriate amount of insulin based on directions at the start of meal or within the first 20 minutes of starting meal  Medication management . Pharmacist Clinical Goal(s): o Over the next 90 days, patient will work with PharmD and providers to achieve optimal medication adherence . Current pharmacy: [facility] . Interventions o Comprehensive medication review performed. o  Utilize [facility] pharmacy for medication synchronization, packaging and delivery . Patient self care activities - Over the next 90 days, patient will: o Focus on medication adherence by using a pill box to organize medications o Take medications as prescribed o Report any questions or concerns to PharmD and/or provider(s)  Please see past updates related to this goal by clicking on the "Past Updates" button in the  selected goal         The patient verbalized understanding of instructions, educational materials, and care plan provided today and agreed to receive a mailed copy of patient instructions, educational materials, and care plan.   The pharmacy team will reach out to the patient again over the next 14 days.   Harlan Stains, [facility]

## 2020-02-06 ENCOUNTER — Telehealth: Payer: Medicare PPO

## 2020-02-06 ENCOUNTER — Telehealth: Payer: Self-pay

## 2020-02-06 NOTE — Telephone Encounter (Cosign Needed)
   02/06/2020  Gabriella Acosta 07-11-1945 [id number]  A second unsuccessful telephone outreach was attempted today. I spoke with [name] briefly today, unfortunately, she is unable to speak with me at this time due to she is driving home from out of town. She would like a call back on Monday. The patient was referred to the case management team for assistance with care management and care coordination.   Follow Up Plan: Telephone follow up appointment with care management team member scheduled for: 02/09/20  Delsa Sale, RN, BSN, CCM Care Management Coordinator [facility] Care Management/[facility]  Direct Phone: [phone]

## 2020-02-09 ENCOUNTER — Telehealth: Payer: Medicare PPO

## 2020-02-09 ENCOUNTER — Other Ambulatory Visit: Payer: Self-pay

## 2020-02-09 ENCOUNTER — Ambulatory Visit: Payer: Self-pay

## 2020-02-09 DIAGNOSIS — E876 Hypokalemia: Secondary | ICD-10-CM

## 2020-02-09 DIAGNOSIS — E782 Mixed hyperlipidemia: Secondary | ICD-10-CM

## 2020-02-09 DIAGNOSIS — I10 Essential (primary) hypertension: Secondary | ICD-10-CM

## 2020-02-09 DIAGNOSIS — E1165 Type 2 diabetes mellitus with hyperglycemia: Secondary | ICD-10-CM

## 2020-02-09 MED ORDER — ATORVASTATIN CALCIUM 80 MG PO TABS: 80.0000 mg | ORAL_TABLET | Freq: Every day | ORAL | 1 refills | Status: DC

## 2020-02-09 MED ORDER — POTASSIUM CHLORIDE ER 10 MEQ PO TBCR: 10.0000 meq | EXTENDED_RELEASE_TABLET | Freq: Every day | ORAL | 1 refills | Status: DC

## 2020-02-11 NOTE — Chronic Care Management (AMB) (Signed)
Chronic Care Management   CCM RN Visit Note  02/11/2020 Name: Gabriella Acosta MRN: [id number] DOB: July 08, 1945  Subjective: Gabriella Acosta is a 75 y.o. year old female who is a primary care patient of [name], FNP. The care management team was consulted for assistance with disease management and care coordination needs.    Engaged with patient by telephone for follow up visit in response to provider referral for case management and/or care coordination services.   Consent to Services:  The patient was given information about Chronic Care Management services, agreed to services, and gave verbal consent prior to initiation of services.  Please see initial visit note for detailed documentation.   Patient agreed to services and verbal consent obtained.   Assessment: Review of patient past medical history, allergies, medications, health status, including review of consultants reports, laboratory and other test data, was performed as part of comprehensive evaluation and provision of chronic care management services.   SDOH (Social Determinants of Health) assessments and interventions performed:  No  CCM Care Plan  No Known Allergies  Outpatient Encounter Medications as of 02/09/2020  Medication Sig Note  . amLODipine (NORVASC) 5 MG tablet TAKE 1 TABLET(5 MG) BY MOUTH DAILY   . atorvastatin (LIPITOR) 80 MG tablet Take 1 tablet (80 mg total) by mouth daily.   . Blood Glucose Monitoring Suppl DEVI Check blood sugars twice daily. E11.9   . Continuous Blood Gluc Sensor (FREESTYLE LIBRE 2 SENSOR) MISC Use to check blood sugars daily   . diclofenac sodium (VOLTAREN) 1 % GEL Apply 2 g topically 4 (four) times daily.   . fluconazole (DIFLUCAN) 100 MG tablet Take 1 tablet by mouth today then repeat in 5 days.   [name] glucose blood (ACCU-CHEK GUIDE) test strip Use as instructed   . insulin aspart (FIASP FLEXTOUCH) 100 UNIT/ML FlexTouch Pen Inject into the skin. Use as directed per sliding  scale three times daily   . Insulin Glargine (BASAGLAR KWIKPEN) 100 UNIT/ML Inject 30 Units into the skin at bedtime. 03/31/2019: [employer] patient assistance  . Insulin Pen Needle (B-D UF III MINI PEN NEEDLES) 31G X 5 MM MISC USE AS DIRECTED DAILY   . Lancets (ACCU-CHEK SOFT TOUCH) lancets Use as instructed   . magnesium 30 MG tablet Take 30 mg by mouth once a week.   . meloxicam (MOBIC) 15 MG tablet TK 1 T PO QD AS NEEDED WITH FOOD   . niacin (NIASPAN) 500 MG CR tablet TAKE ONE TABLET BY MOUTH EVERYDAY AT BEDTIME   . potassium chloride (KLOR-CON 10) 10 MEQ tablet Take 1 tablet (10 mEq total) by mouth daily.   . sitaGLIPtin (JANUVIA) 100 MG tablet Take 1 tablet (100 mg total) by mouth daily.    No facility-administered encounter medications on file as of 02/09/2020.    Patient Active Problem List   Diagnosis Date Noted  . Cognitive deficits 11/07/2018  . ETOH abuse 07/30/2013  . Anxiety 07/30/2013  . Essential hypertension 01/31/2011    Conditions to be addressed/monitored:DM II, HTN, Mixed hyperlipidemia  Care Plan : Diabetes Type 2 (Adult)  Updates made by Riley Churches, RN since 02/11/2020 12:00 AM    Problem: Glycemic Management (Diabetes, Type 2)   Priority: High    Long-Range Goal: Glycemic Management Optimized   Start Date: 02/09/2020  Expected End Date: 05/07/2020  This Visit's Progress: On track  Priority: High  Note:   CARE PLAN ENTRY (see longitudinal plan of care for additional care plan information)  Current Barriers:  [name] Knowledge Deficits related to disease process and Self Health Management of Diabetes . Chronic Disease Management support and education needs related to type 2 Diabetes, HTN, Hyperlipidemia Nurse Case Manager Clinical Goal(s):  [name] Over the next 90 days, patient will work with CCM team and PCP to address needs related to disease education and support for improved Self Health management of DM CCM RN CM Interventions:  Late Entry from  02/09/20 . Inter-disciplinary care team collaboration (see longitudinal plan of care) . Evaluation of current treatment plan related to Diabetes and patient's adherence to plan as established by provider. . 02/09/20 Successful call completed with patient  . Re-educated patient re: current A1c has increased to 14.0%; Re-Educated on target A1c <7.0; Re-Educated on dietary and exercise recommendations; Re-Educated on daily glycemic control FBS 80-130; <180 after meals; Re-Educated on 15'15' rule  . Determined patient now has the [facility] and is monitoring and recording her CBG's 3-4 times daily, average readings are above target range . Determined patient is newly established with the embedded Pharm D to help manage her DM . Reviewed medications with patient and discussed patient is adhering to taking her prescribed medications w/o noted SE or missed doses:  Basaglar Kwikpen 30 units at bedtime  Januvia 100mg  daily  Fiasp three times daily per sliding scale . Reinforced the importance of medication adherence w/o missed doses . Confirmed patient received the mailed educational booklet related to Living Well with Diabetes  . Advised patient, providing education and rationale, to check cbg 2-3 daily before meals and record, calling the CCM team and PCP for findings outside established parameters . Discussed plans with patient for ongoing care management follow up and provided patient with direct contact information for care management team  Patient Self Care Activities:  Over the next 90 days, patient will:  Monitor blood sugars before meals and at bedtime  Take DM medications exactly as prescribed  Adhere to ADA diet as discussed  Attend all scheduled provider appointments Call pharmacy for medication refills Call provider office for new concerns or questions  Follow up Plan: RNCM will follow up with patient by phone over the next two months     Plan:Telephone follow up appointment with  care management team member scheduled for:  03/22/20  03/24/20, RN, BSN, CCM Care Management Coordinator [facility] Care Management/[facility]  Direct Phone: [phone]

## 2020-02-11 NOTE — Patient Instructions (Signed)
Goals Addressed    .  Lower my A1c (pt-stated)        Timeframe:  Long-Range Goal Priority:  High Start Date: 02/09/20                            Expected End Date: 05/08/20  Follow up date: 03/22/20  Over the next 90 days, patient will:  Monitor blood sugars before meals and at bedtime  Take DM medications exactly as prescribed  Adhere to ADA diet as discussed  Attend all scheduled provider appointments Call pharmacy for medication refills Call provider office for new concerns or questions

## 2020-02-15 DIAGNOSIS — E1165 Type 2 diabetes mellitus with hyperglycemia: Secondary | ICD-10-CM | POA: Diagnosis not present

## 2020-02-16 ENCOUNTER — Other Ambulatory Visit: Payer: Self-pay | Admitting: Nurse Practitioner

## 2020-02-17 ENCOUNTER — Ambulatory Visit: Payer: Medicare PPO | Admitting: Nurse Practitioner

## 2020-02-18 ENCOUNTER — Telehealth: Payer: Self-pay

## 2020-02-18 NOTE — Chronic Care Management (AMB) (Signed)
Chronic Care Management Pharmacy Assistant   Name: Gabriella Acosta  MRN: [id number] DOB: 1945-11-26  Reason for Encounter: Disease State and Medication Review  Patient Questions:  1.  Have you seen any other providers since your last visit? Yes, 1/20/2022Enrique Sack Acosta (CCM); 02/03/2020- Gabriella Acosta (CCM); 02/06/2020- Gabriella Acosta (CCM); 02/09/2020- Gabriella Acosta (CCM).  2.  Any changes in your medicines or health? No    PCP : [name], FNP  Allergies:  No Known Allergies  Medications: Outpatient Encounter Medications as of 02/18/2020  Medication Sig Note  . amLODipine (NORVASC) 5 MG tablet TAKE 1 TABLET(5 MG) BY MOUTH DAILY   . atorvastatin (LIPITOR) 80 MG tablet Take 1 tablet (80 mg total) by mouth daily.   . Blood Glucose Monitoring Suppl DEVI Check blood sugars twice daily. E11.9   . Continuous Blood Gluc Sensor (FREESTYLE LIBRE 2 SENSOR) MISC Use to check blood sugars daily   . diclofenac sodium (VOLTAREN) 1 % GEL Apply 2 g topically 4 (four) times daily.   . fluconazole (DIFLUCAN) 100 MG tablet Take 1 tablet by mouth today then repeat in 5 days.   [name] glucose blood (ACCU-CHEK GUIDE) test strip Use as instructed   . insulin aspart (FIASP FLEXTOUCH) 100 UNIT/ML FlexTouch Pen Inject into the skin. Use as directed per sliding scale three times daily   . Insulin Glargine (BASAGLAR KWIKPEN) 100 UNIT/ML Inject 30 Units into the skin at bedtime. 03/31/2019: [employer] patient assistance  . Insulin Pen Needle (B-D UF III MINI PEN NEEDLES) 31G X 5 MM MISC USE AS DIRECTED DAILY   . Lancets (ACCU-CHEK SOFT TOUCH) lancets Use as instructed   . losartan (COZAAR) 50 MG tablet TAKE ONE TABLET BY MOUTH ONCE DAILY   . magnesium 30 MG tablet Take 30 mg by mouth once a week.   . meloxicam (MOBIC) 15 MG tablet TK 1 T PO QD AS NEEDED WITH FOOD   . niacin (NIASPAN) 500 MG CR tablet TAKE ONE TABLET BY MOUTH EVERYDAY AT BEDTIME   . potassium chloride (KLOR-CON 10) 10 MEQ tablet Take 1 tablet (10 mEq  total) by mouth daily.   . sitaGLIPtin (JANUVIA) 100 MG tablet Take 1 tablet (100 mg total) by mouth daily.    No facility-administered encounter medications on file as of 02/18/2020.    Current Diagnosis: Patient Active Problem List   Diagnosis Date Noted  . Cognitive deficits 11/07/2018  . ETOH abuse 07/30/2013  . Anxiety 07/30/2013  . Essential hypertension 01/31/2011   Recent Relevant Labs: Lab Results  Component Value Date/Time   HGBA1C 14.0 (H) 10/20/2019 10:22 AM   HGBA1C 10.4 (H) 07/30/2019 03:33 PM   MICROALBUR 30 12/10/2018 05:09 PM   MICROALBUR 150 10/31/2017 12:47 PM    Kidney Function Lab Results  Component Value Date/Time   CREATININE 0.67 10/20/2019 10:22 AM   CREATININE 0.59 07/30/2019 03:33 PM   GFRNONAA 88 10/20/2019 10:22 AM   GFRAA 102 10/20/2019 10:22 AM    . Current antihyperglycemic regimen:  o Basaglar Kwikpen- 30 untis at bedtime o Januvia 100 mg- 1 tablet daily o Fiasp- three times daily per sliding scale . What recent interventions/DTPs have been made to improve glycemic control:  o Patient is checking blood sugars twice a day . Have there been any recent hospitalizations or ED visits since last visit with CPP? No . Patient denies hypoglycemic symptoms. . Patient denies hyperglycemic symptoms. . How often are you checking your blood sugar? twice daily . What  are your blood sugars ranging?  o Fasting: 100-129 o Before meals: none o After meals: 189 o Bedtime: none . During the week, how often does your blood glucose drop below 70? Never . Are you checking your feet daily/regularly? Yes  Adherence Review: Is the patient currently on a STATIN medication? Yes- Atorvastatin 80 mg Is the patient currently on ACE/ARB medication? Yes- Losartan Does the patient have >5 day gap between last estimated fill dates? No   Reviewed chart for medication changes ahead of medication coordination call.  OVs-1/20/2022Enrique Sack Acosta (CCM); 02/03/2020- Gabriella  Acosta (CCM); 02/06/2020- Gabriella Acosta (CCM); 02/09/2020- Gabriella Acosta (CCM) since last care coordination call/Pharmacist visit.   No medication changes indicated.  BP Readings from Last 3 Encounters:  10/20/19 136/70  07/30/19 124/70  07/30/19 124/70    Lab Results  Component Value Date   HGBA1C 14.0 (H) 10/20/2019     Patient obtains medications through Adherence Packaging  90 Days   Last adherence delivery included:  Potassium Chloride 10 mg- 1 tablet daily (before breakfast) Atorvastatin 80 mg- 1 tablet daily (before breakfast) Losartan 50 mg- 1 tablet daily (before breakfast)   Patient declined the following medications last month: Telmisartan 20 mg- 1 tablet daily due to being discontinued Niacin Er 500 mg- 1 tablet daily due to receiving  OTC. Amlodipine 5 mg- 1 tablet daily - last filled on 12/16/2019 71DS  Patient is due for next adherence delivery on: 02/20/2020. Called patient and reviewed medications and coordinated delivery. Patient would like delivery on 02/23/2020.  This delivery to include: Potassium Chloride 10 mg- 1 tablet daily (before breakfast) Atorvastatin 80 mg- 1 tablet daily (before breakfast) Niacin Er 500 mg- 1 tablet daily (bedtime)  No short fill or acute fill needed.   Patient declined the following medications: Losartan 50 mg- 1 tablet daily (before breakfast) due to receiving a 34 day supply on 01/23/2020. Amlodipine 5 mg- 1 tablet daily - last filled on 12/16/2019 71DS Telmisartan 20 mg- 1 tablet daily due to being discontinued Fluconazole 100 mg- due completing therapy Freestyle Libre sensors- not needed due to receiving from [employer] [facility].  Evaristo Bury Flextouch- medication discontinued, change in therapy, patient using Fiasp, Hospital doctor and Januvia - getting through [employer] and [employer] patient assistance programs. Accu-check Guide- notneeded  Meloxicam 7.5 mg- discontinued BD Ultra-fine Mini Pen needles- not needed.     Coordinated future fills for Amlodipine 5 mg and Losartan 50 mg to be delivered on 02/25/2020.  Patient needs refills for Amlodipine 5 mg( request sent for future fill).  Confirmed delivery date of 02/23/2020, advised patient that pharmacy will contact them the morning of delivery.  Follow-Up:  Coordination of Enhanced Pharmacy Services and Pharmacist Review  Patient's mother recently passed, funeral 02/20/2020. Patient requested delivery on 02/23/2020. [name] Awaiting signature for patient assistance forms for 2 months. Patient has not requested refills of medications and states she has enough.    [name], CPP notified.  Billee Cashing, CMA Clinical Pharmacist Assistant [phone]

## 2020-02-23 ENCOUNTER — Other Ambulatory Visit: Payer: Self-pay

## 2020-02-23 MED ORDER — AMLODIPINE BESYLATE 5 MG PO TABS: ORAL_TABLET | ORAL | 0 refills | Status: DC

## 2020-03-14 DIAGNOSIS — E1165 Type 2 diabetes mellitus with hyperglycemia: Secondary | ICD-10-CM | POA: Diagnosis not present

## 2020-03-17 ENCOUNTER — Ambulatory Visit (INDEPENDENT_AMBULATORY_CARE_PROVIDER_SITE_OTHER): Payer: Medicare PPO

## 2020-03-17 ENCOUNTER — Telehealth: Payer: Self-pay

## 2020-03-17 DIAGNOSIS — I10 Essential (primary) hypertension: Secondary | ICD-10-CM

## 2020-03-17 DIAGNOSIS — E1165 Type 2 diabetes mellitus with hyperglycemia: Secondary | ICD-10-CM

## 2020-03-17 NOTE — Patient Instructions (Signed)
   Goals we discussed today:  Goals Addressed            This Visit's Progress   . Barriers to Treatment Identified and Managed       Timeframe:  Long-Range Goal Priority:  Low Start Date: 1.20.22                            Expected End Date: 5.20.22                      Next planned outreach: 4.7.22  Patient Goals/Self-Care Activities Over the next 45 days, patient will:   - Patient will self administer medications as prescribed Patient will attend all scheduled provider appointments Patient will call provider office for new concerns or questions Complete and submit Medicaid application Contact SW as needed prior to next scheduled call

## 2020-03-17 NOTE — Chronic Care Management (AMB) (Signed)
Chronic Care Management Pharmacy Assistant   Name: Gabriella Acosta  MRN: [id number] DOB: Apr 28, 1945  Reason for Encounter: Medication Review   Recent office visits:  03/17/2020- [name] (CCM) 03/25/2020- [name], PA-C (PCP)  Recent consult visits:  None  Hospital visits:  None in previous 6 months   Reviewed chart for medication changes ahead of medication coordination call.  Medication changes indicated- Gvoke Hypopen 0.5mg  added 03/25/2020.  BP Readings from Last 3 Encounters:  03/25/20 114/80  10/20/19 136/70  07/30/19 124/70    Lab Results  Component Value Date   HGBA1C 14.1 (H) 03/25/2020     Patient obtains medications through Adherence Packaging  90 Days   Last adherence delivery included:  Potassium Chloride 10 mg- 1 tablet daily (before breakfast) Atorvastatin 80 mg- 1 tablet daily (before breakfast) Niacin Er 500 mg- 1 tablet daily(bedtime)  Patient declined the following medications last month: Losartan 50 mg- 1 tablet daily (before breakfast) due to receiving a 34 day supply on 01/23/2020. Amlodipine 5 mg- 1 tablet daily - last filled on 12/16/2019 71DS Telmisartan 20 mg- 1 tablet dailydue to being discontinued Fluconazole 100 mg- due completing therapy Freestyle Libre sensors- not needed due to receiving from [place] [facility].  Evaristo Bury Flextouch- medication discontinued, change in therapy, patient using Fiasp, Hospital doctor and Januvia - getting through [employer] and [employer] patient assistance programs. Accu-check Guide- notneeded  Meloxicam 7.5 mg- discontinued BD Ultra-fine Mini Pen needles- not needed  Patient is due for next adherence delivery on: 03/22/2020. 03/17/2020- Called patient to review medications and coordinated delivery, no answer, vm box full. 03/23/2020- Called patient again, no answer, left message to return call. 03/26/2020- Called patient again, no answer, voicemail box full. 03/26/2020- Patient returned  call.    This delivery to include: No other medication.  Gvoke Hypopen 0.5 mg- Inject 0.5 mg into skin as needed, sent in by PCP- Dr Christell Constant on 03/25/2020, medication delivered 03/29/2020.  No short fill or acute fill needed.  Patient declined the following medications: Potassium Chloride 10 mg- 1 tablet daily (before breakfast) Atorvastatin 80 mg- 1 tablet daily (before breakfast) Losartan 50 mg- 1 tablet daily (before breakfast)  Amlodipine 5 mg- 1 tablet daily      Last filled 02/23/2020 & 02/24/2020 for a 90 day supply. Niacin Er 500 mg- 1 tablet daily(bedtime) Last filled 02/23/2020 for 30 day supply  Telmisartan 20 mg- 1 tablet dailydue to being discontinued Fluconazole 100 mg- due completing therapy Freestyle Libre sensors- not needed due to receiving from [employer] [facility].  Evaristo Bury Flextouch- medication discontinued, change in therapy, patient using Fiasp, Hospital doctor and Januvia - getting through [employer] and [employer] patient assistance programs. Accu-check Guide- notneeded  Meloxicam 7.5 mg- discontinued BD Ultra-fine Mini Pen needles- not needed   Patient needs refills for- None.  Patient aware we will f/u next month to medications and assess delivery needs.  Medications: Outpatient Encounter Medications as of 03/17/2020  Medication Sig Note  . amLODipine (NORVASC) 5 MG tablet Take one tablet by mouth daily   . atorvastatin (LIPITOR) 80 MG tablet Take 1 tablet (80 mg total) by mouth daily.   . Blood Glucose Monitoring Suppl DEVI Check blood sugars twice daily. E11.9   . Continuous Blood Gluc Sensor (FREESTYLE LIBRE 2 SENSOR) MISC Use to check blood sugars daily   . diclofenac sodium (VOLTAREN) 1 % GEL Apply 2 g topically 4 (four) times daily.   . fluconazole (DIFLUCAN) 100 MG tablet Take 1 tablet  by mouth today then repeat in 5 days.   [name] glucose blood (ACCU-CHEK GUIDE) test strip Use as instructed   . insulin aspart (FIASP FLEXTOUCH) 100 UNIT/ML FlexTouch Pen Inject  into the skin. Use as directed per sliding scale three times daily   . Insulin Glargine (BASAGLAR KWIKPEN) 100 UNIT/ML Inject 30 Units into the skin at bedtime. 03/31/2019: [employer] patient assistance  . Insulin Pen Needle (B-D UF III MINI PEN NEEDLES) 31G X 5 MM MISC USE AS DIRECTED DAILY   . Lancets (ACCU-CHEK SOFT TOUCH) lancets Use as instructed   . losartan (COZAAR) 50 MG tablet TAKE ONE TABLET BY MOUTH ONCE DAILY   . magnesium 30 MG tablet Take 30 mg by mouth once a week.   . meloxicam (MOBIC) 15 MG tablet TK 1 T PO QD AS NEEDED WITH FOOD   . niacin (NIASPAN) 500 MG CR tablet TAKE ONE TABLET BY MOUTH EVERYDAY AT BEDTIME   . potassium chloride (KLOR-CON 10) 10 MEQ tablet Take 1 tablet (10 mEq total) by mouth daily.   . sitaGLIPtin (JANUVIA) 100 MG tablet Take 1 tablet (100 mg total) by mouth daily.    No facility-administered encounter medications on file as of 03/17/2020.     Star Rating Drugs: Atorvastatin 80 mg- Last filled 02/24/2020 for 90 day supply at [facility]. Evaristo Bury last filled 02/04/2019 for 30 day supply at [facility] Losartan 50 mg- Last filled 02/23/2020 for 90 day supply at [facility]. Telmisartan 20 mg- Last filled 03/11/2020 for 90 day supply at [facility] Tradjenta 5 mg- Last filled 10/02/2018 for 90 day supply at [facility].   SIG: Billee Cashing, CMA Clinical Pharmacist Assistant [phone]

## 2020-03-17 NOTE — Chronic Care Management (AMB) (Signed)
Chronic Care Management    Social Work Note  03/17/2020 Name: Gabriella Acosta MRN: [id number] DOB: [birth year]  Gabriella Acosta is a 75 y.o. year old female who is a primary care patient of [name], [place]. The CCM team was consulted to assist the patient with chronic disease management and/or care coordination needs related to: [employer] .   Engaged with patient by telephone for follow up visit in response to provider referral for social work chronic care management and care coordination services.   Consent to Services:  The patient was given information about Chronic Care Management services, agreed to services, and gave verbal consent prior to initiation of services.  Please see initial visit note for detailed documentation.   Patient agreed to services and consent obtained.   Assessment: Review of patient past medical history, allergies, medications, and health status, including review of relevant consultants reports was performed today as part of a comprehensive evaluation and provision of chronic care management and care coordination services.     SDOH (Social Determinants of Health) assessments and interventions performed:    Advanced Directives Status: Not addressed in this encounter.  CCM Care Plan  No Known Allergies  Outpatient Encounter Medications as of 03/17/2020  Medication Sig Note  . amLODipine (NORVASC) 5 MG tablet Take one tablet by mouth daily   . atorvastatin (LIPITOR) 80 MG tablet Take 1 tablet (80 mg total) by mouth daily.   . Blood Glucose Monitoring Suppl DEVI Check blood sugars twice daily. E11.9   . Continuous Blood Gluc Sensor (FREESTYLE LIBRE 2 SENSOR) MISC Use to check blood sugars daily   . diclofenac sodium (VOLTAREN) 1 % GEL Apply 2 g topically 4 (four) times daily.   . fluconazole (DIFLUCAN) 100 MG tablet Take 1 tablet by mouth today then repeat in 5 days.   [name] glucose blood (ACCU-CHEK GUIDE) test strip Use as instructed   . insulin  aspart (FIASP FLEXTOUCH) 100 UNIT/ML FlexTouch Pen Inject into the skin. Use as directed per sliding scale three times daily   . Insulin Glargine (BASAGLAR KWIKPEN) 100 UNIT/ML Inject 30 Units into the skin at bedtime. 03/31/2019: [employer] patient assistance  . Insulin Pen Needle (B-D UF III MINI PEN NEEDLES) 31G X 5 MM MISC USE AS DIRECTED DAILY   . Lancets (ACCU-CHEK SOFT TOUCH) lancets Use as instructed   . losartan (COZAAR) 50 MG tablet TAKE ONE TABLET BY MOUTH ONCE DAILY   . magnesium 30 MG tablet Take 30 mg by mouth once a week.   . meloxicam (MOBIC) 15 MG tablet TK 1 T PO QD AS NEEDED WITH FOOD   . niacin (NIASPAN) 500 MG CR tablet TAKE ONE TABLET BY MOUTH EVERYDAY AT BEDTIME   . potassium chloride (KLOR-CON 10) 10 MEQ tablet Take 1 tablet (10 mEq total) by mouth daily.   . sitaGLIPtin (JANUVIA) 100 MG tablet Take 1 tablet (100 mg total) by mouth daily.    No facility-administered encounter medications on file as of 03/17/2020.    Patient Active Problem List   Diagnosis Date Noted  . Cognitive deficits 11/07/2018  . ETOH abuse 07/30/2013  . Anxiety 07/30/2013  . Essential hypertension 01/31/2011    Conditions to be addressed/monitored: HTN and DMII; Financial constraints related to Medical costs  Care Plan : Social Work [facility] care plan  Updates made by Daneen Schick since 03/17/2020 12:00 AM    Problem: Barriers to Treatment     Long-Range Goal: Barriers to Treatment Identified and Managed  Start Date: 01/29/2020  Expected End Date: 05/28/2020  This Visit's Progress: Not on track  Priority: Low  Note:   Current Barriers:  . Financial constraints related to cost of medical care . Limited social support . Chronic conditions including DM II, HTN, and Mixed Hyperlipidemia which put patient at increased risk of hospitalization  Social Work Clinical Goal(s):  [name] Over the next 30 days patient will complete Medicaid application  Goal not met . New 3.9.22 Over the next 45 days the  patient will work with SW to obtain a Medicaid application  Interventions: . 1:1 collaboration with [name], [place] regarding development and update of comprehensive plan of care as evidenced by provider attestation and co-signature . Inter-disciplinary care team collaboration (see longitudinal plan of care) . Successful outbound call placed to the patient to assess goal progression . Determined the patient did not attend recent OV and therefore did not pick up Medicaid application . Mailed patient Medicaid application to her home address . Performed chart review to note an abdominal ultrasound was ordered in November 2021 due to elevated liver enzymes - patient reports she did not complete test as ordered . Advised the patient SW would outreach her primary care provider to determine if this test is still needed . Collaboration with [name], FNP to request a member of the practice contact the patient to instruct if the ultrasound is still needed . Scheduled follow up call over the next month  Patient Goals/Self-Care Activities Over the next 45 days, patient will:   - Patient will self administer medications as prescribed Patient will attend all scheduled provider appointments Patient will call provider office for new concerns or questions Complete and submit Medicaid application Contact SW as needed prior to next scheduled call  Follow up Plan: SW will follow up with patient by phone over the next  month       Follow Up Plan: SW will follow up with patient by phone over the next month.      Daneen Schick, BSW, CDP Social Worker, Certified Dementia Practitioner [place] [facility] [phone]  Total time spent performing care coordination and/or care management activities with the patient by phone or face to face = 30 minutes.

## 2020-03-22 ENCOUNTER — Ambulatory Visit: Payer: Self-pay

## 2020-03-22 ENCOUNTER — Telehealth: Payer: Medicare PPO

## 2020-03-22 DIAGNOSIS — E782 Mixed hyperlipidemia: Secondary | ICD-10-CM | POA: Diagnosis not present

## 2020-03-22 DIAGNOSIS — I10 Essential (primary) hypertension: Secondary | ICD-10-CM

## 2020-03-22 DIAGNOSIS — E1165 Type 2 diabetes mellitus with hyperglycemia: Secondary | ICD-10-CM

## 2020-03-23 NOTE — Chronic Care Management (AMB) (Signed)
Chronic Care Management   CCM RN Visit Note  03/22/2020 Name: Gabriella Acosta MRN: [id number] DOB: 07/06/1945  Subjective: Shakelia Scrivner is a 75 y.o. year old female who is a primary care patient of [name], FNP. The care management team was consulted for assistance with disease management and care coordination needs.    Engaged with patient by telephone for follow up visit in response to provider referral for case management and/or care coordination services.   Consent to Services:  The patient was given information about Chronic Care Management services, agreed to services, and gave verbal consent prior to initiation of services.  Please see initial visit note for detailed documentation.   Patient agreed to services and verbal consent obtained.   Assessment: Review of patient past medical history, allergies, medications, health status, including review of consultants reports, laboratory and other test data, was performed as part of comprehensive evaluation and provision of chronic care management services.   SDOH (Social Determinants of Health) assessments and interventions performed:  Yes, no acute needs identified   CCM Care Plan  No Known Allergies  Outpatient Encounter Medications as of 03/22/2020  Medication Sig Note  . amLODipine (NORVASC) 5 MG tablet Take one tablet by mouth daily   . atorvastatin (LIPITOR) 80 MG tablet Take 1 tablet (80 mg total) by mouth daily.   . Blood Glucose Monitoring Suppl DEVI Check blood sugars twice daily. E11.9   . Continuous Blood Gluc Sensor (FREESTYLE LIBRE 2 SENSOR) MISC Use to check blood sugars daily   . diclofenac sodium (VOLTAREN) 1 % GEL Apply 2 g topically 4 (four) times daily.   . fluconazole (DIFLUCAN) 100 MG tablet Take 1 tablet by mouth today then repeat in 5 days.   [name] glucose blood (ACCU-CHEK GUIDE) test strip Use as instructed   . insulin aspart (FIASP FLEXTOUCH) 100 UNIT/ML FlexTouch Pen Inject into the skin. Use as  directed per sliding scale three times daily   . Insulin Glargine (BASAGLAR KWIKPEN) 100 UNIT/ML Inject 30 Units into the skin at bedtime. 03/31/2019: [employer] patient assistance  . Insulin Pen Needle (B-D UF III MINI PEN NEEDLES) 31G X 5 MM MISC USE AS DIRECTED DAILY   . Lancets (ACCU-CHEK SOFT TOUCH) lancets Use as instructed   . losartan (COZAAR) 50 MG tablet TAKE ONE TABLET BY MOUTH ONCE DAILY   . magnesium 30 MG tablet Take 30 mg by mouth once a week.   . meloxicam (MOBIC) 15 MG tablet TK 1 T PO QD AS NEEDED WITH FOOD   . niacin (NIASPAN) 500 MG CR tablet TAKE ONE TABLET BY MOUTH EVERYDAY AT BEDTIME   . potassium chloride (KLOR-CON 10) 10 MEQ tablet Take 1 tablet (10 mEq total) by mouth daily.   . sitaGLIPtin (JANUVIA) 100 MG tablet Take 1 tablet (100 mg total) by mouth daily.    No facility-administered encounter medications on file as of 03/22/2020.    Patient Active Problem List   Diagnosis Date Noted  . Cognitive deficits 11/07/2018  . ETOH abuse 07/30/2013  . Anxiety 07/30/2013  . Essential hypertension 01/31/2011    Conditions to be addressed/monitored:DM II, HTN, Mixed hyperlipidemia  Care Plan : Diabetes Type 2 (Adult)  Updates made by Riley Churches, RN since 03/23/2020 12:00 AM    Problem: Glycemic Management (Diabetes, Type 2)   Priority: High    Long-Range Goal: Glycemic Management Optimized   Start Date: 02/09/2020  Expected End Date: 08/06/2020  This Visit's Progress: On track  Recent Progress: On track  Priority: High  Note:   CARE PLAN ENTRY (see longitudinal plan of care for additional care plan information)  Current Barriers:  [name] Knowledge Deficits related to disease process and Self Health Management of Diabetes . Chronic Disease Management support and education needs related to type 2 Diabetes, HTN, Hyperlipidemia Nurse Case Manager Clinical Goal(s):  [name] Over the next 180 days, patient will work with CCM team and PCP to address needs related to disease  education and support for improved Self Health management of DM CCM RN CM Interventions:  03/22/20 success call completed with patient  . Inter-disciplinary care team collaboration (see longitudinal plan of care) . Evaluation of current treatment plan related to Diabetes and patient's adherence to plan as established by provider. . Educated patient re: current A1c has increased to 14.0%; Educated on target A1c 7.5; Educated on dietary and exercise recommendations; Educated on daily glycemic control FBS 80-130; <180 after meals; Educated on 15'15' rule  . Determined patient now has the [employer] and is monitoring and recording her CBG's 3-4 times daily, average readings are above target range, states running in the 200's . Determined patient is newly established with the embedded Pharm D to help manage her DM . Reviewed medications with patient and discussed patient is adhering to taking her prescribed medications w/o noted SE or missed doses:  Basaglar Kwikpen 30 units at bedtime  Januvia 100mg  daily  Fiasp three times daily per sliding scale . Reinforced the importance of medication adherence w/o missed doses and keeping all scheduled PCP f/u appointments  . Confirmed patient received the mailed educational booklet related to Living Well with Diabetes  . Advised patient, providing education and rationale, to check cbg 2-3 daily before meals and record, calling the CCM team and PCP for findings outside established parameters . Discussed plans with patient for ongoing care management follow up and provided patient with direct contact information for care management team Patient Self Care Activities:  Over the next 180 days, patient will:  Monitor blood sugars before meals and at bedtime  Take DM medications exactly as prescribed  Adhere to ADA diet as discussed  Attend all scheduled provider appointments Call pharmacy for medication refills Call provider office for new concerns or  questions  Follow up Plan: RNCM will follow up with patient by phone on or around 04/13/20     Plan:Telephone follow up appointment with care management team member scheduled for:  04/13/20   06/13/20, RN, BSN, CCM Care Management Coordinator [facility]  Direct Phone: [phone]

## 2020-03-23 NOTE — Patient Instructions (Signed)
Goals Addressed      Patient Stated   .  Get better control over Diabetes (pt-stated)   On track     Timeframe:  Long-Range Goal Priority:  High Start Date: 02/09/20                            Expected End Date: 08/06/20  Follow up date: 04/13/20  Over the next 90 days, patient will:  Monitor blood sugars before meals and at bedtime  Take DM medications exactly as prescribed  Adhere to ADA diet as discussed  Attend all scheduled provider appointments Call pharmacy for medication refills Call provider office for new concerns or questions

## 2020-03-25 ENCOUNTER — Other Ambulatory Visit: Payer: Self-pay

## 2020-03-25 ENCOUNTER — Ambulatory Visit: Payer: Medicare PPO | Admitting: Nurse Practitioner

## 2020-03-25 ENCOUNTER — Encounter: Payer: Self-pay | Admitting: Nurse Practitioner

## 2020-03-25 VITALS — BP 114/80 | HR 63 | Temp 98.2°F | Ht 63.8 in | Wt 124.6 lb

## 2020-03-25 DIAGNOSIS — E1165 Type 2 diabetes mellitus with hyperglycemia: Secondary | ICD-10-CM

## 2020-03-25 DIAGNOSIS — R61 Generalized hyperhidrosis: Secondary | ICD-10-CM

## 2020-03-25 DIAGNOSIS — G4700 Insomnia, unspecified: Secondary | ICD-10-CM

## 2020-03-25 DIAGNOSIS — Z1211 Encounter for screening for malignant neoplasm of colon: Secondary | ICD-10-CM | POA: Diagnosis not present

## 2020-03-25 DIAGNOSIS — E2839 Other primary ovarian failure: Secondary | ICD-10-CM | POA: Diagnosis not present

## 2020-03-25 DIAGNOSIS — I10 Essential (primary) hypertension: Secondary | ICD-10-CM

## 2020-03-25 DIAGNOSIS — Z1231 Encounter for screening mammogram for malignant neoplasm of breast: Secondary | ICD-10-CM | POA: Diagnosis not present

## 2020-03-25 DIAGNOSIS — E782 Mixed hyperlipidemia: Secondary | ICD-10-CM | POA: Diagnosis not present

## 2020-03-25 MED ORDER — GVOKE HYPOPEN 2-PACK 0.5 MG/0.1ML ~~LOC~~ SOAJ
0.5000 mg | SUBCUTANEOUS | 2 refills | Status: DC | PRN
Start: 2020-03-25 — End: 2020-10-18

## 2020-03-25 NOTE — Progress Notes (Signed)
I,Yamilka Roman [employer] as a Education administrator for [employer], FNP.,have documented all relevant documentation on the behalf of [name], FNP,as directed by  [name], FNP while in the presence of [name], [place]. This visit occurred during the SARS-CoV-2 public health emergency.  Safety protocols were in place, including screening questions prior to the visit, additional usage of staff PPE, and extensive cleaning of exam room while observing appropriate contact time as indicated for disinfecting solutions.  Subjective:     Patient ID: Gabriella Acosta , female    DOB: 01-17-1945 , 75 y.o.   MRN: [id number]   Chief Complaint  Patient presents with  . Diabetes  . Weight Check  . Hyperlipidemia  . Hypertension    HPI  Patient presents today for a f/u on her diabetes/ chol and weight check. She is having episodes of diaphoresis at night. She wears the freestyle libre, she needs to apply a new sensor.    Wt Readings from Last 3 Encounters: 03/25/20 : 124 lb 9.6 oz (56.5 kg) 10/20/19 : 125 lb 12.8 oz (57.1 kg) 07/30/19 : 131 lb 2.8 oz (59.5 kg)  She is taking basaglar 30 units nightly.   Diabetes She presents for her follow-up diabetic visit. She has type 2 diabetes mellitus. Her disease course has been worsening. Hypoglycemia symptoms include dizziness. There are no diabetic associated symptoms. Pertinent negatives for diabetes include no blurred vision, no chest pain, no polydipsia, no polyphagia and no polyuria. There are no hypoglycemic complications. There are no diabetic complications. Risk factors for coronary artery disease include sedentary lifestyle. She is following a generally unhealthy diet. When asked about meal planning, she reported none. She has not had a previous visit with a dietitian. She never participates in exercise. (230's - 400's) An ACE inhibitor/angiotensin II receptor blocker is being taken. She does not see a podiatrist.Eye exam is not current (she has not  had a diabetic eye exam).  Hyperlipidemia This is a chronic problem. The problem is uncontrolled. Recent lipid tests were reviewed and are high. Pertinent negatives include no chest pain.  Hypertension This is a chronic problem. The current episode started more than 1 year ago. The problem has been gradually improving since onset. Condition status: better controlled. Pertinent negatives include no blurred vision, chest pain or palpitations. Risk factors for coronary artery disease include sedentary lifestyle, dyslipidemia and diabetes mellitus. Past treatments include angiotensin blockers. There is no history of angina or kidney disease.     Past Medical History:  Diagnosis Date  . Hypertension      Family History  Problem Relation Age of Onset  . Diabetes Mother   . Diabetes Father   . Heart disease Father      Current Outpatient Medications:  .  amLODipine (NORVASC) 5 MG tablet, Take one tablet by mouth daily, Disp: 30 tablet, Rfl: 0 .  atorvastatin (LIPITOR) 80 MG tablet, Take 1 tablet (80 mg total) by mouth daily., Disp: 90 tablet, Rfl: 1 .  Blood Glucose Monitoring Suppl DEVI, Check blood sugars twice daily. E11.9, Disp: 1 kit, Rfl: 0 .  Continuous Blood Gluc Sensor (FREESTYLE LIBRE 2 SENSOR) MISC, Use to check blood sugars daily, Disp: 1 each, Rfl: 5 .  diclofenac sodium (VOLTAREN) 1 % GEL, Apply 2 g topically 4 (four) times daily., Disp: 100 g, Rfl: 1 .  fluconazole (DIFLUCAN) 100 MG tablet, Take 1 tablet by mouth today then repeat in 5 days., Disp: 2 tablet, Rfl: 0 .  Glucagon (GVOKE  HYPOPEN 2-PACK) 0.5 MG/0.1ML SOAJ, Inject 0.5 mg into the skin as needed., Disp: 0.1 mL, Rfl: 2 .  glucose blood (ACCU-CHEK GUIDE) test strip, Use as instructed, Disp: 100 each, Rfl: 12 .  insulin aspart (FIASP FLEXTOUCH) 100 UNIT/ML FlexTouch Pen, Inject into the skin. Use as directed per sliding scale three times daily, Disp: , Rfl:  .  Insulin Glargine (BASAGLAR KWIKPEN) 100 UNIT/ML, Inject 30  Units into the skin at bedtime., Disp: , Rfl:  .  Insulin Pen Needle (B-D UF III MINI PEN NEEDLES) 31G X 5 MM MISC, USE AS DIRECTED DAILY, Disp: 100 each, Rfl: 3 .  Lancets (ACCU-CHEK SOFT TOUCH) lancets, Use as instructed, Disp: 100 each, Rfl: 12 .  losartan (COZAAR) 50 MG tablet, TAKE ONE TABLET BY MOUTH ONCE DAILY, Disp: 34 tablet, Rfl: 2 .  magnesium 30 MG tablet, Take 30 mg by mouth once a week., Disp: , Rfl:  .  meloxicam (MOBIC) 15 MG tablet, TK 1 T PO QD AS NEEDED WITH FOOD, Disp: , Rfl:  .  niacin (NIASPAN) 500 MG CR tablet, TAKE ONE TABLET BY MOUTH EVERYDAY AT BEDTIME, Disp: 30 tablet, Rfl: 3 .  potassium chloride (KLOR-CON 10) 10 MEQ tablet, Take 1 tablet (10 mEq total) by mouth daily., Disp: 90 tablet, Rfl: 1 .  sitaGLIPtin (JANUVIA) 100 MG tablet, Take 1 tablet (100 mg total) by mouth daily., Disp: 30 tablet, Rfl: 0   No Known Allergies   Review of Systems  Eyes: Negative for blurred vision.  Cardiovascular: Negative for chest pain, palpitations and leg swelling.  Gastrointestinal: Negative.   Endocrine: Negative for polydipsia, polyphagia and polyuria.  Neurological: Positive for dizziness.  Psychiatric/Behavioral: Negative.      Today's Vitals   03/25/20 1103  BP: 114/80  Pulse: 63  Temp: 98.2 F (36.8 C)  TempSrc: Oral  Weight: 124 lb 9.6 oz (56.5 kg)  Height: 5' 3.8" (1.621 m)  PainSc: 0-No pain   Body mass index is 21.52 kg/m.   Objective:  Physical Exam Constitutional:      General: She is not in acute distress.    Appearance: Normal appearance. She is normal weight.  Cardiovascular:     Rate and Rhythm: Normal rate and regular rhythm.     Pulses: Normal pulses.  Pulmonary:     Effort: Pulmonary effort is normal.     Breath sounds: Normal breath sounds.  Abdominal:     General: Abdomen is flat. Bowel sounds are normal.     Palpations: Abdomen is soft.  Musculoskeletal:        General: Normal range of motion.     Cervical back: Normal range of  motion and neck supple.  Skin:    General: Skin is warm and dry.     Capillary Refill: Capillary refill takes less than 2 seconds.  Neurological:     General: No focal deficit present.     Mental Status: She is alert and oriented to person, place, and time.  Psychiatric:        Mood and Affect: Mood normal.        Behavior: Behavior normal.        Thought Content: Thought content normal.        Judgment: Judgment normal.         Assessment And Plan:     1. Essential hypertension  Chronic, good control  Continue with current medications - CMP14+EGFR  2. Mixed hyperlipidemia  Chronic, controlled  Continue with current medications,  tolerating medications well  She was referred to the lipid clinic - Lipid panel - CMP14+EGFR  3. Uncontrolled type 2 diabetes mellitus with hyperglycemia ([place])  She has not been in to the office in approximately 7 months  Will check HgbA1c  Pending results will refer to Endocrinology  She declines going to a diabetic nutritionist, states "I know what I should be eating"  - Hemoglobin A1c  4. Decreased estrogen level  - DG Bone Density; Future  5. Insomnia, unspecified type  Encouraged to take melatonin over the counter as needed  6. Diaphoresis  This is occurring at night unsure if related to low blood sugar since she does not have her Freestyle libre to review her readings  Will send rx for Gvoke with instructions to use if her blood sugar is less than 70  She is to check her blood sugar when this occurs to see if too low - Glucagon (GVOKE HYPOPEN 2-PACK) 0.5 MG/0.1ML SOAJ; Inject 0.5 mg into the skin as needed.  Dispense: 0.1 mL; Refill: 2  7. Encounter for screening colonoscopy  According to USPTF Colorectal cancer Screening guidelines. Colonoscopy is recommended every 10 years, starting at age 57years.  Will refer to GI for colon cancer screening. - Ambulatory referral to Gastroenterology  8. Encounter for screening  mammogram for malignant neoplasm of breast  Pt instructed on Self Breast Exam.According to ACOG guidelines Women aged 97 and older are recommended to get an annual mammogram. Form completed and given to patient contact the [facility] for appointment scheduing.   Pt encouraged to get annual mammogram - MM Digital Screening; Future     Patient was given opportunity to ask questions. Patient verbalized understanding of the plan and was able to repeat key elements of the plan. All questions were answered to their satisfaction.  [name], FNP   I, [name], FNP, have reviewed all documentation for this visit. The documentation on 04/04/20 for the exam, diagnosis, procedures, and orders are all accurate and complete.   IF YOU HAVE BEEN REFERRED TO A SPECIALIST, IT MAY TAKE 1-2 WEEKS TO SCHEDULE/PROCESS THE REFERRAL. IF YOU HAVE NOT HEARD FROM US/SPECIALIST IN TWO WEEKS, PLEASE GIVE [place] A CALL AT [phone].   THE PATIENT IS ENCOURAGED TO PRACTICE SOCIAL DISTANCING DUE TO THE COVID-19 PANDEMIC.

## 2020-03-25 NOTE — Patient Instructions (Signed)
Diabetes Mellitus Basics  Diabetes mellitus, or diabetes, is a long-term (chronic) disease. It occurs when the body does not properly use sugar (glucose) that is released from food after you eat. Diabetes mellitus may be caused by one or both of these problems:  Your pancreas does not make enough of a hormone called insulin.  Your body does not react in a normal way to the insulin that it makes. Insulin lets glucose enter cells in your body. This gives you energy. If you have diabetes, glucose cannot get into cells. This causes high blood glucose (hyperglycemia). How to treat and manage diabetes You may need to take insulin or other diabetes medicines daily to keep your glucose in balance. If you are prescribed insulin, you will learn how to give yourself insulin by injection. You may need to adjust the amount of insulin you take based on the foods that you eat. You will need to check your blood glucose levels using a glucose monitor as told by your health care provider. The readings can help determine if you have low or high blood glucose. Generally, you should have these blood glucose levels:  Before meals (preprandial): 80-130 mg/dL (4.4-7.2 mmol/L).  After meals (postprandial): below 180 mg/dL (10 mmol/L).  Hemoglobin A1c (HbA1c) level: less than 7%. Your health care provider will set treatment goals for you. Keep all follow-up visits. This is important. Follow these instructions at home: Diabetes medicines Take your diabetes medicines every day as told by your health care provider. List your diabetes medicines here:  Name of medicine: ______________________________ ? Amount (dose): _______________ Time (a.m./p.m.): _______________ Notes: ___________________________________  Name of medicine: ______________________________ ? Amount (dose): _______________ Time (a.m./p.m.): _______________ Notes: ___________________________________  Name of medicine:  ______________________________ ? Amount (dose): _______________ Time (a.m./p.m.): _______________ Notes: ___________________________________ Insulin If you use insulin, list the types of insulin you use here:  Insulin type: ______________________________ ? Amount (dose): _______________ Time (a.m./p.m.): _______________Notes: ___________________________________  Insulin type: ______________________________ ? Amount (dose): _______________ Time (a.m./p.m.): _______________ Notes: ___________________________________  Insulin type: ______________________________ ? Amount (dose): _______________ Time (a.m./p.m.): _______________ Notes: ___________________________________  Insulin type: ______________________________ ? Amount (dose): _______________ Time (a.m./p.m.): _______________ Notes: ___________________________________  Insulin type: ______________________________ ? Amount (dose): _______________ Time (a.m./p.m.): _______________ Notes: ___________________________________ Managing blood glucose Check your blood glucose levels using a glucose monitor as told by your health care provider. Write down the times that you check your glucose levels here:  Time: _______________ Notes: ___________________________________  Time: _______________ Notes: ___________________________________  Time: _______________ Notes: ___________________________________  Time: _______________ Notes: ___________________________________  Time: _______________ Notes: ___________________________________  Time: _______________ Notes: ___________________________________   Low blood glucose Low blood glucose (hypoglycemia) is when glucose is at or below 70 mg/dL (3.9 mmol/L). Symptoms may include:  Feeling: ? Hungry. ? Sweaty and clammy. ? Irritable or easily upset. ? Dizzy. ? Sleepy.  Having: ? A fast heartbeat. ? A headache. ? A change in your vision. ? Numbness around the mouth, lips, or  tongue.  Having trouble with: ? Moving (coordination). ? Sleeping. Treating low blood glucose To treat low blood glucose, eat or drink something containing sugar right away. If you can think clearly and swallow safely, follow the 15:15 rule:  Take 15 grams of a fast-acting carb (carbohydrate), as told by your health care provider.  Some fast-acting carbs are: ? Glucose tablets: take 3-4 tablets. ? Hard candy: eat 3-5 pieces. ? Fruit juice: drink 4 oz (120 mL). ? Regular (not diet) soda: drink 4-6 oz (120-180 mL). ? Honey or sugar:   eat 1 Tbsp (15 mL).  Check your blood glucose levels 15 minutes after you take the carb.  If your glucose is still at or below 70 mg/dL (3.9 mmol/L), take 15 grams of a carb again.  If your glucose does not go above 70 mg/dL (3.9 mmol/L) after 3 tries, get help right away.  After your glucose goes back to normal, eat a meal or a snack within 1 hour. Treating very low blood glucose If your glucose is at or below 54 mg/dL (3 mmol/L), you have very low blood glucose (severe hypoglycemia). This is an emergency. Do not wait to see if the symptoms will go away. Get medical help right away. Call your local emergency services (911 in the [place] Do not drive yourself to the hospital. Questions to ask your health care provider  Should I talk with a diabetes educator?  What equipment will I need to care for myself at home?  What diabetes medicines do I need? When should I take them?  How often do I need to check my blood glucose levels?  What number can I call if I have questions?  When is my follow-up visit?  Where can I find a support group for people with diabetes? Where to find more information  American Diabetes Association: www.diabetes.org  Association of Diabetes Care and Education Specialists: www.diabeteseducator.org Contact a health care provider if:  Your blood glucose is at or above 240 mg/dL (13.3 mmol/L) for 2 days in a row.  You have  been sick or have had a fever for 2 days or more, and you are not getting better.  You have any of these problems for more than 6 hours: ? You cannot eat or drink. ? You feel nauseous. ? You vomit. ? You have diarrhea. Get help right away if:  Your blood glucose is lower than 54 mg/dL (3 mmol/L).  You get confused.  You have trouble thinking clearly.  You have trouble breathing. These symptoms may represent a serious problem that is an emergency. Do not wait to see if the symptoms will go away. Get medical help right away. Call your local emergency services (911 in the [place] Do not drive yourself to the hospital. Summary  Diabetes mellitus is a chronic disease that occurs when the body does not properly use sugar (glucose) that is released from food after you eat.  Take insulin and diabetes medicines as told.  Check your blood glucose every day, as often as told.  Keep all follow-up visits. This is important. This information is not intended to replace advice given to you by your health care provider. Make sure you discuss any questions you have with your health care provider. Document Revised: 04/29/2019 Document Reviewed: 04/29/2019 Elsevier Patient Education  2021 [employer] Inc[employer]  

## 2020-03-26 LAB — CMP14+EGFR
ALT: 39 IU/L — ABNORMAL HIGH (ref 0–32)
AST: 30 IU/L (ref 0–40)
Albumin/Globulin Ratio: 1.6 (ref 1.2–2.2)
Albumin: 4.6 g/dL (ref 3.7–4.7)
Alkaline Phosphatase: 161 IU/L — ABNORMAL HIGH (ref 44–121)
BUN/Creatinine Ratio: 15 (ref 12–28)
BUN: 10 mg/dL (ref 8–27)
Bilirubin Total: 0.3 mg/dL (ref 0.0–1.2)
CO2: 21 mmol/L (ref 20–29)
Calcium: 10.3 mg/dL (ref 8.7–10.3)
Chloride: 95 mmol/L — ABNORMAL LOW (ref 96–106)
Creatinine, Ser: 0.67 mg/dL (ref 0.57–1.00)
Globulin, Total: 2.8 g/dL (ref 1.5–4.5)
Glucose: 411 mg/dL — ABNORMAL HIGH (ref 65–99)
Potassium: 4.7 mmol/L (ref 3.5–5.2)
Sodium: 136 mmol/L (ref 134–144)
Total Protein: 7.4 g/dL (ref 6.0–8.5)
eGFR: 92 mL/min/{1.73_m2} (ref >59)

## 2020-03-26 LAB — HEMOGLOBIN A1C
Est. average glucose Bld gHb Est-mCnc: 358 mg/dL
Hgb A1c MFr Bld: 14.1 % — ABNORMAL HIGH (ref 4.8–5.6)

## 2020-03-26 LAB — LIPID PANEL
Chol/HDL Ratio: 4.6 ratio — ABNORMAL HIGH (ref 0.0–4.4)
Cholesterol, Total: 185 mg/dL (ref 100–199)
HDL: 40 mg/dL (ref >39)
LDL Chol Calc (NIH): 91 mg/dL (ref 0–99)
Triglycerides: 324 mg/dL — ABNORMAL HIGH (ref 0–149)
VLDL Cholesterol Cal: 54 mg/dL — ABNORMAL HIGH (ref 5–40)

## 2020-03-29 ENCOUNTER — Other Ambulatory Visit: Payer: Self-pay | Admitting: Nurse Practitioner

## 2020-03-29 DIAGNOSIS — E1165 Type 2 diabetes mellitus with hyperglycemia: Secondary | ICD-10-CM

## 2020-03-29 NOTE — Progress Notes (Signed)
Referral made to Endocrinology

## 2020-04-13 ENCOUNTER — Telehealth: Payer: Self-pay | Admitting: *Deleted

## 2020-04-13 ENCOUNTER — Telehealth: Payer: Medicare PPO

## 2020-04-13 DIAGNOSIS — E1165 Type 2 diabetes mellitus with hyperglycemia: Secondary | ICD-10-CM | POA: Diagnosis not present

## 2020-04-13 NOTE — Chronic Care Management (AMB) (Signed)
  Care Management   Note  04/13/2020 Name: Gabriella Acosta MRN: [id number] DOB: Nov 09, 1945  Gabriella Acosta is a 75 y.o. year old female who is a primary care patient of [name], FNP and is actively engaged with the care management team. I reached out to Barrie Dunker by phone today to assist with scheduling a follow up visit with the Pharmacist  Follow up plan: Telephone appointment with care management team member scheduled for:04/27/2020  [facility] Guide, Embedded Care Coordination [facility] Management

## 2020-04-15 ENCOUNTER — Telehealth: Payer: Self-pay

## 2020-04-15 ENCOUNTER — Ambulatory Visit (INDEPENDENT_AMBULATORY_CARE_PROVIDER_SITE_OTHER): Payer: Medicare PPO

## 2020-04-15 DIAGNOSIS — E1165 Type 2 diabetes mellitus with hyperglycemia: Secondary | ICD-10-CM

## 2020-04-15 DIAGNOSIS — I10 Essential (primary) hypertension: Secondary | ICD-10-CM

## 2020-04-15 NOTE — Chronic Care Management (AMB) (Signed)
Chronic Care Management    Social Work Note  04/15/2020 Name: Gabriella Acosta MRN: [id number] DOB: [birth year]  Gabriella Acosta is a 75 y.o. year old female who is a primary care patient of [name], [place]. The CCM team was consulted to assist the patient with chronic disease management and/or care coordination needs related to: [employer] .   Engaged with patient by telephone for follow up visit in response to provider referral for social work chronic care management and care coordination services.   Consent to Services:  The patient was given information about Chronic Care Management services, agreed to services, and gave verbal consent prior to initiation of services.  Please see initial visit note for detailed documentation.   Patient agreed to services and consent obtained.   Assessment: Review of patient past medical history, allergies, medications, and health status, including review of relevant consultants reports was performed today as part of a comprehensive evaluation and provision of chronic care management and care coordination services.     SDOH (Social Determinants of Health) assessments and interventions performed:    Advanced Directives Status: Not addressed in this encounter.  CCM Care Plan  No Known Allergies  Outpatient Encounter Medications as of 04/15/2020  Medication Sig Note  . amLODipine (NORVASC) 5 MG tablet Take one tablet by mouth daily   . atorvastatin (LIPITOR) 80 MG tablet Take 1 tablet (80 mg total) by mouth daily.   . Blood Glucose Monitoring Suppl DEVI Check blood sugars twice daily. E11.9   . Continuous Blood Gluc Sensor (FREESTYLE LIBRE 2 SENSOR) MISC Use to check blood sugars daily   . diclofenac sodium (VOLTAREN) 1 % GEL Apply 2 g topically 4 (four) times daily.   . fluconazole (DIFLUCAN) 100 MG tablet Take 1 tablet by mouth today then repeat in 5 days.   . Glucagon (GVOKE HYPOPEN 2-PACK) 0.5 MG/0.1ML SOAJ Inject 0.5 mg into the  skin as needed.   [name] glucose blood (ACCU-CHEK GUIDE) test strip Use as instructed   . insulin aspart (FIASP FLEXTOUCH) 100 UNIT/ML FlexTouch Pen Inject into the skin. Use as directed per sliding scale three times daily   . Insulin Glargine (BASAGLAR KWIKPEN) 100 UNIT/ML Inject 30 Units into the skin at bedtime. 03/31/2019: [employer] patient assistance  . Insulin Pen Needle (B-D UF III MINI PEN NEEDLES) 31G X 5 MM MISC USE AS DIRECTED DAILY   . Lancets (ACCU-CHEK SOFT TOUCH) lancets Use as instructed   . losartan (COZAAR) 50 MG tablet TAKE ONE TABLET BY MOUTH ONCE DAILY   . magnesium 30 MG tablet Take 30 mg by mouth once a week.   . meloxicam (MOBIC) 15 MG tablet TK 1 T PO QD AS NEEDED WITH FOOD   . niacin (NIASPAN) 500 MG CR tablet TAKE ONE TABLET BY MOUTH EVERYDAY AT BEDTIME   . potassium chloride (KLOR-CON 10) 10 MEQ tablet Take 1 tablet (10 mEq total) by mouth daily.   . sitaGLIPtin (JANUVIA) 100 MG tablet Take 1 tablet (100 mg total) by mouth daily.    No facility-administered encounter medications on file as of 04/15/2020.    Patient Active Problem List   Diagnosis Date Noted  . Cognitive deficits 11/07/2018  . ETOH abuse 07/30/2013  . Anxiety 07/30/2013  . Essential hypertension 01/31/2011    Conditions to be addressed/monitored: HTN and DMII; Financial constraints related to medical care  Care Plan : Social Work [facility] care plan  Updates made by Daneen Schick since 04/15/2020 12:00 AM  Problem: Barriers to Treatment     Long-Range Goal: Barriers to Treatment Identified and Managed   Start Date: 01/29/2020  Expected End Date: 05/28/2020  Recent Progress: Not on track  Priority: Low  Note:   Current Barriers:  . Financial constraints related to cost of medical care . Limited social support . Chronic conditions including DM II, HTN, and Mixed Hyperlipidemia which put patient at increased risk of hospitalization  Social Work Clinical Goal(s):  [name] Over the next 30 days patient will  complete Medicaid application  Goal not met . New 3.9.22 Over the next 45 days the patient will work with SW to obtain a Medicaid application  Interventions: . 1:1 collaboration with [name], [place] regarding development and update of comprehensive plan of care as evidenced by provider attestation and co-signature . Inter-disciplinary care team collaboration (see longitudinal plan of care) . Successful outbound call placed to the patient to assess goal progression . Determined the patient is not sure if she received a Medicaid application via mail . Reviewed patient income level- patient slightly over limit to obtain Medicaid . Advised the patient there is a chance she will qualify for MQB Medicaid . Provided the patient with the contact number to [place] to complete a Medicaid application . Patient reports she is almost out of insulin . Performed chart review to note the patient spoke with pharmacy team today who has faxed in patient assistance application to [facility] . Advised the patient the pharmacy team is actively working to obtain insulin on behalf of the patient . Scheduled follow up call over the next month  Patient Goals/Self-Care Activities Over the next 45 days, patient will:   - Patient will self administer medications as prescribed Patient will attend all scheduled provider appointments Patient will call provider office for new concerns or questions Complete and submit Medicaid application Contact SW as needed prior to next scheduled call  Follow up Plan: SW will follow up with patient by phone over the next  month       Follow Up Plan: SW will follow up with patient by phone over the next month      Daneen Schick, BSW, CDP Social Worker, Certified Dementia Practitioner [place] [facility] [place] [facility] [phone]  Total time spent performing care coordination and/or care management activities with the patient by phone or face to face = 20 minutes.

## 2020-04-15 NOTE — Chronic Care Management (AMB) (Signed)
Chronic Care Management Pharmacy Assistant   Name: Gabriella Acosta  MRN: [id number] DOB: [birth year]  Reason for Encounter: Medication Review/ Medication Coordination Call/ Patient Assistance Coordination/ Patient Education Handout   Recent office visits:  03/25/2020- [name], FNP (PCP)  Recent consult visits:  None  Hospital visits:  None in previous 6 months   Reviewed chart for medication changes ahead of medication coordination call.  Medication changes indicated: Glucagon 0.5 mg subcutaneous as needed added on 03/25/2020.  BP Readings from Last 3 Encounters:  03/25/20 114/80  10/20/19 136/70  07/30/19 124/70    Lab Results  Component Value Date   HGBA1C 14.1 (H) 03/25/2020     Patient obtains medications through Adherence Packaging  90 Days   Last adherence delivery included:  Gvoke Hypopen 0.5 mg- Inject 0.5 mg into skin as needed  Patient declined the following medications last month: Potassium Chloride 10 mg- 1 tablet daily (before breakfast) Atorvastatin 80 mg- 1 tablet daily (before breakfast) Losartan 50 mg- 1 tablet daily (before breakfast) Amlodipine 5 mg- 1 tablet daily                                                  Last filled 02/23/2020 & 02/24/2020 for a 90 day supply. Niacin Er 500 mg- 1 tablet daily(bedtime) Last filled 02/23/2020 for 30 day supply  Telmisartan 20 mg- 1 tablet dailydue to being discontinued Fluconazole 100 mg- due completing therapy Freestyle Libre sensors- not needed due to receiving from [facility].  Evaristo Bury Flextouch- medication discontinued, change in therapy, patient using Fiasp, Hospital doctor and Januvia - getting through [employer] and [employer] patient assistance programs. Accu-check Guide- notneeded  Meloxicam 7.5 mg- discontinued BD Ultra-fine Mini Pen needles- not needed  Patient is due for next adherence delivery on: 04/21/2020. Called patient and reviewed medications and coordinated  delivery.  This delivery to include: None- No fill needed.  No short fill or acute fill needed.  Patient declined the following medications: Gvoke Hypopen 0.5 mg- Inject 0.5 mg into skin as needed Potassium Chloride 10 mg- 1 tablet daily (before breakfast) Atorvastatin 80 mg- 1 tablet daily (before breakfast) Losartan 50 mg- 1 tablet daily (before breakfast) Amlodipine 5 mg- 1 tablet daily                                                  Last filled 02/23/2020 & 02/24/2020 for a 90 day supply. Niacin Er 500 mg- 1 tablet daily(bedtime) Last filled 02/23/2020 for 30 day supply- Patient has not started medication yet but will start today 03/15/2020  Telmisartan 20 mg- 1 tablet dailydue to being discontinued Fluconazole 100 mg- due completing therapy Freestyle Libre sensors- not needed due to receiving from [facility].  Evaristo Bury Flextouch- medication discontinued, change in therapy, patient using Fiasp, Hospital doctor and Januvia - getting through [employer] and [employer] patient assistance programs. Accu-check Guide- notneeded  Meloxicam 7.5 mg- discontinued BD Ultra-fine Mini Pen needles- not needed  Patient needs refills for: None.    Medications: Outpatient Encounter Medications as of 04/15/2020  Medication Sig Note  . amLODipine (NORVASC) 5 MG tablet Take one tablet by mouth daily   . atorvastatin (LIPITOR) 80 MG  tablet Take 1 tablet (80 mg total) by mouth daily.   . Blood Glucose Monitoring Suppl DEVI Check blood sugars twice daily. E11.9   . Continuous Blood Gluc Sensor (FREESTYLE LIBRE 2 SENSOR) MISC Use to check blood sugars daily   . diclofenac sodium (VOLTAREN) 1 % GEL Apply 2 g topically 4 (four) times daily.   . fluconazole (DIFLUCAN) 100 MG tablet Take 1 tablet by mouth today then repeat in 5 days.   . Glucagon (GVOKE HYPOPEN 2-PACK) 0.5 MG/0.1ML SOAJ Inject 0.5 mg into the skin as needed.   [name] glucose blood (ACCU-CHEK GUIDE) test strip Use as instructed   . insulin  aspart (FIASP FLEXTOUCH) 100 UNIT/ML FlexTouch Pen Inject into the skin. Use as directed per sliding scale three times daily   . Insulin Glargine (BASAGLAR KWIKPEN) 100 UNIT/ML Inject 30 Units into the skin at bedtime. 03/31/2019: [employer] patient assistance  . Insulin Pen Needle (B-D UF III MINI PEN NEEDLES) 31G X 5 MM MISC USE AS DIRECTED DAILY   . Lancets (ACCU-CHEK SOFT TOUCH) lancets Use as instructed   . losartan (COZAAR) 50 MG tablet TAKE ONE TABLET BY MOUTH ONCE DAILY   . magnesium 30 MG tablet Take 30 mg by mouth once a week.   . meloxicam (MOBIC) 15 MG tablet TK 1 T PO QD AS NEEDED WITH FOOD   . niacin (NIASPAN) 500 MG CR tablet TAKE ONE TABLET BY MOUTH EVERYDAY AT BEDTIME   . potassium chloride (KLOR-CON 10) 10 MEQ tablet Take 1 tablet (10 mEq total) by mouth daily.   . sitaGLIPtin (JANUVIA) 100 MG tablet Take 1 tablet (100 mg total) by mouth daily.    No facility-administered encounter medications on file as of 04/15/2020.    Star Rating Drugs: Atorvastatin 80 mg- Last filled 02/24/2020 for 90 day supply at [facility]. Losartan 50 mg- Last filled 02/23/2020 for 90 day supply at [facility]. Telmisartan 20 mg- Last filled 03/11/2020 for 90 day supply at [facility]. Tradjenta 5 mg- Last filled 10/02/2018 for 90 days supply at [facility].    Notes: Patient checks blood sugars daily, she is having some elevated readings of 300's, 200's, 195 and 150. Patient is now taking her Tresiba 30 units at night instead of morning and doing her Basaglar 30 units in the morning, patient changed on her own. Unable to gather blood pressure readings from patient, she is more focused on elevated blood sugar readings, last blood pressure reading in office last month was very good.  Patient has not started Niacin as prescribed. Patient states she will start it today.  Patient is trying to gain weight, she is at 124Lbs but not sure what she should be eating that won't increase her blood sugars or make her  loose any more weight. Patient states she has a stable diet, she does not eat candy or cake, she goes to the gym and tries to eat a boiled egg in the morning or cereal. Patient is not sure if she is getting the right protein intake, she states she does not like [place] but will eat more fish if it will help with muscle weight.   Diabetes meal plan patient handout emailed to Mrs. Marina Goodell. Patient aware she will be contacted by either me, PharmD or PCP regarding elevated blood sugars and medication regimen. Patient assistance forms for Basaglar faxed to [facility], patient states she is almost out of medication.   Called patient back, she is aware that [name], CPP was notified  about elevated blood sugars and insulin change recommendations, PharmD is collaborating with PCP, patient aware she will hear from [name], FNP (PCP) regarding medication change or I will contact her with more information. Patient understands that if her blood sugars continue to increase to contact me today and tomorrow so we can give her a plan to eliminate elevated blood sugar readings of 300+.  Patient has a follow up visit with [name], CPP on  04/28/2020 @ 3:45 pm  SIG: Billee Cashing, [facility] Clinical Pharmacist Assistant [phone]

## 2020-04-15 NOTE — Patient Instructions (Signed)
Social Worker Visit Information  Goals we discussed today:  Goals Addressed            This Visit's Progress   . Barriers to Treatment Identified and Managed       Timeframe:  Long-Range Goal Priority:  Low Start Date: 1.20.22                            Expected End Date: 5.20.22                      Next planned outreach: 5.3.22  Patient Goals/Self-Care Activities Over the next 45 days, patient will:   - Patient will self administer medications as prescribed Patient will attend all scheduled provider appointments Patient will call provider office for new concerns or questions Complete and submit Medicaid application Contact SW as needed prior to next scheduled call        Materials Provided: Verbal education about Medicaid application provided by phone  Follow Up Plan: SW will follow up with patient by phone over the next month   Bevelyn Ngo, BSW, CDP Social Worker, Certified Dementia Practitioner [facility] Care Management [phone]

## 2020-04-27 ENCOUNTER — Telehealth: Payer: Self-pay

## 2020-04-27 ENCOUNTER — Telehealth: Payer: Medicare PPO

## 2020-04-27 NOTE — Chronic Care Management (AMB) (Signed)
    Chronic Care Management Pharmacy Assistant   Name: Gabriella Acosta  MRN: [id number] DOB: 08/23/1945  Reason for Encounter: Patient Assistance Coordination   04/27/2020- Called patient to remind of appointment with [name], CPP on 04/28/2020 at 3:45 pm. Patient aware of appointment date and time, patient thought her appointment was today but she is looking forward to talking with [name] tomorrow. Patient did inquire about patient assistance medication Fiasp with [employer], she is running low on supply, she only has 1 pen left. Patient aware I will follow up with [employer] and PCP office on shipment status.  [name], CPP notified.    04/28/2020- Patient assistance medication for Fiasp not at PCP office, application was not found, will create new application for Fiasp with [employer] patient assistance program.  04/29/2020- Application filled in for [employer] with [employer] patient assistance program. Printed out for provider to sign, awaiting patient signature and income, will check for samples for patient and notify patient .   05/04/2020- Called patient to inform that we will need signature and income documentation to send to [employer] for [place], patient aware, she is also aware of sample that would be available if she runs out before she receives her patient assistance medication. Patient states she can come by this week to sign, forms left at the front desk at PCP office for signature, patient aware.    Medications: Outpatient Encounter Medications as of 04/27/2020  Medication Sig Note  . amLODipine (NORVASC) 5 MG tablet Take one tablet by mouth daily   . atorvastatin (LIPITOR) 80 MG tablet Take 1 tablet (80 mg total) by mouth daily.   . Blood Glucose Monitoring Suppl DEVI Check blood sugars twice daily. E11.9   . Continuous Blood Gluc Sensor (FREESTYLE LIBRE 2 SENSOR) MISC Use to check blood sugars daily   . diclofenac sodium (VOLTAREN) 1 % GEL Apply 2 g  topically 4 (four) times daily.   . fluconazole (DIFLUCAN) 100 MG tablet Take 1 tablet by mouth today then repeat in 5 days.   . Glucagon (GVOKE HYPOPEN 2-PACK) 0.5 MG/0.1ML SOAJ Inject 0.5 mg into the skin as needed.   [name] glucose blood (ACCU-CHEK GUIDE) test strip Use as instructed   . insulin aspart (FIASP FLEXTOUCH) 100 UNIT/ML FlexTouch Pen Inject into the skin. Use as directed per sliding scale three times daily   . Insulin Glargine (BASAGLAR KWIKPEN) 100 UNIT/ML Inject 30 Units into the skin at bedtime. 03/31/2019: [employer] patient assistance  . Insulin Pen Needle (B-D UF III MINI PEN NEEDLES) 31G X 5 MM MISC USE AS DIRECTED DAILY   . Lancets (ACCU-CHEK SOFT TOUCH) lancets Use as instructed   . losartan (COZAAR) 50 MG tablet TAKE ONE TABLET BY MOUTH ONCE DAILY   . magnesium 30 MG tablet Take 30 mg by mouth once a week.   . meloxicam (MOBIC) 15 MG tablet TK 1 T PO QD AS NEEDED WITH FOOD   . niacin (NIASPAN) 500 MG CR tablet TAKE ONE TABLET BY MOUTH EVERYDAY AT BEDTIME   . potassium chloride (KLOR-CON 10) 10 MEQ tablet Take 1 tablet (10 mEq total) by mouth daily.   . sitaGLIPtin (JANUVIA) 100 MG tablet Take 1 tablet (100 mg total) by mouth daily.    No facility-administered encounter medications on file as of 04/27/2020.   Billee Cashing, [facility] Health Concierge [phone]

## 2020-04-28 ENCOUNTER — Ambulatory Visit: Payer: Medicare PPO

## 2020-04-28 DIAGNOSIS — E782 Mixed hyperlipidemia: Secondary | ICD-10-CM

## 2020-04-28 DIAGNOSIS — I10 Essential (primary) hypertension: Secondary | ICD-10-CM

## 2020-04-28 DIAGNOSIS — E1165 Type 2 diabetes mellitus with hyperglycemia: Secondary | ICD-10-CM

## 2020-04-28 NOTE — Progress Notes (Signed)
Chronic Care Management Pharmacy Note  05/05/2020 Name:  Lakaisha Danish MRN:  [id number] DOB:  Apr 14, 1945  Subjective: Jordane Hisle is an 75 y.o. year old female who is a primary patient of [name], [place].  The CCM team was consulted for assistance with disease management and care coordination needs.    Engaged with patient by telephone for follow up visit in response to provider referral for pharmacy case management and/or care coordination services.   Consent to Services:  The patient was given information about Chronic Care Management services, agreed to services, and gave verbal consent prior to initiation of services.  Please see initial visit note for detailed documentation.   Patient Care Team: [name], FNP as PCP - General (General Practice) Daneen Schick as Social Worker Mayford Knife, [facility] (Pharmacist)  Recent office visits: 03/25/2020 PCP Office visit     Hospital visits: None in previous 6 months  Objective:  Lab Results  Component Value Date   CREATININE 0.67 03/25/2020   BUN 10 03/25/2020   GFRNONAA 88 10/20/2019   GFRAA 102 10/20/2019   NA 136 03/25/2020   K 4.7 03/25/2020   CALCIUM 10.3 03/25/2020   CO2 21 03/25/2020   GLUCOSE 411 (H) 03/25/2020    Lab Results  Component Value Date/Time   HGBA1C 14.1 (H) 03/25/2020 12:13 PM   HGBA1C 14.0 (H) 10/20/2019 10:22 AM   MICROALBUR 30 12/10/2018 05:09 PM   MICROALBUR 150 10/31/2017 12:47 PM    Last diabetic Eye exam:  Lab Results  Component Value Date/Time   HMDIABEYEEXA No Retinopathy 09/02/2019 12:00 AM    Last diabetic Foot exam: No results found for: HMDIABFOOTEX   Lab Results  Component Value Date   CHOL 185 03/25/2020   HDL 40 03/25/2020   LDLCALC 91 03/25/2020   TRIG 324 (H) 03/25/2020   CHOLHDL 4.6 (H) 03/25/2020    Hepatic Function Latest Ref Rng & Units 03/25/2020 10/20/2019 07/30/2019  Total Protein 6.0 - 8.5 g/dL 7.4 7.7 8.0  Albumin 3.7 - 4.7 g/dL 4.6 4.5  4.5  AST 0 - 40 IU/L '30 18 22  ' ALT 0 - 32 IU/L 39(H) 21 18  Alk Phosphatase 44 - 121 IU/L 161(H) 185(H) 179(H)  Total Bilirubin 0.0 - 1.2 mg/dL 0.3 0.3 0.2    Lab Results  Component Value Date/Time   TSH 1.650 10/20/2019 10:22 AM    CBC Latest Ref Rng & Units 10/20/2019 07/30/2019 10/02/2018  WBC 3.4 - 10.8 x10E3/uL 12.8(H) 9.9 9.8  Hemoglobin 11.1 - 15.9 g/dL 13.5 12.6 13.8  Hematocrit 34.0 - 46.6 % 42.2 37.7 41.0  Platelets 150 - 450 x10E3/uL 132(L) 92(LL) 103(L)    No results found for: VD25OH  Clinical ASCVD: No  The 10-year ASCVD risk score Mikey Bussing DC Jr., et al., 2013) is: 50.2%   Values used to calculate the score:     Age: 5 years     Sex: Female     Is Non-Hispanic African American: No     Diabetic: Yes     Tobacco smoker: Yes     Systolic Blood Pressure: 748 mmHg     Is BP treated: Yes     HDL Cholesterol: 40 mg/dL     Total Cholesterol: 185 mg/dL    Depression screen [facility] 2/9 07/30/2019 02/04/2019 12/10/2018  Decreased Interest 0 0 0  Down, Depressed, Hopeless 0 0 0  PHQ - 2 Score 0 0 0  Altered sleeping - - -  Tired, decreased  energy - - -  Change in appetite - - -  Feeling bad or failure about yourself  - - -  Trouble concentrating - - -  Moving slowly or fidgety/restless - - -  Suicidal thoughts - - -  PHQ-9 Score - - -  Difficult doing work/chores - - -     Social History   Tobacco Use  Smoking Status Current Every Day Smoker  . Packs/day: 0.25  . Types: Cigarettes  Smokeless Tobacco Current User   BP Readings from Last 3 Encounters:  05/05/20 (!) 142/80  03/25/20 114/80  10/20/19 136/70   Pulse Readings from Last 3 Encounters:  05/05/20 74  03/25/20 63  10/20/19 86   Wt Readings from Last 3 Encounters:  05/05/20 124 lb (56.2 kg)  03/25/20 124 lb 9.6 oz (56.5 kg)  10/20/19 125 lb 12.8 oz (57.1 kg)   BMI Readings from Last 3 Encounters:  05/05/20 21.55 kg/m  03/25/20 21.52 kg/m  10/20/19 21.73 kg/m    Assessment/Interventions:  Review of patient past medical history, allergies, medications, health status, including review of consultants reports, laboratory and other test data, was performed as part of comprehensive evaluation and provision of chronic care management services.   SDOH:  (Social Determinants of Health) assessments and interventions performed: Yes  SDOH Screenings   Alcohol Screen: Not on file  Depression (PHQ2-9): Low Risk   . PHQ-2 Score: 0  Financial Resource Strain: Medium Risk  . Difficulty of Paying Living Expenses: Somewhat hard  Food Insecurity: No Food Insecurity  . Worried About Charity fundraiser in the Last Year: Never true  . Ran Out of Food in the Last Year: Never true  Housing: Low Risk   . Last Housing Risk Score: 0  Physical Activity: Inactive  . Days of Exercise per Week: 0 days  . Minutes of Exercise per Session: 0 min  Social Connections: Not on file  Stress: No Stress Concern Present  . Feeling of Stress : Only a little  Tobacco Use: High Risk  . Smoking Tobacco Use: Current Every Day Smoker  . Smokeless Tobacco Use: Current User  Transportation Needs: No Transportation Needs  . Lack of Transportation (Medical): No  . Lack of Transportation (Non-Medical): No    CCM Care Plan  No Known Allergies  Medications Reviewed Today    Reviewed by Waylan Rocher, CMA (Certified Medical Assistant) on 05/05/20 at [phone]  Med List Status: <None>  Medication Order Taking? Sig Documenting Provider Last Dose Status Informant  amLODipine (NORVASC) 5 MG tablet [id number] Yes Take one tablet by mouth daily [name], FNP Taking Active   atorvastatin (LIPITOR) 80 MG tablet [id number] Yes Take 1 tablet (80 mg total) by mouth daily. [name], FNP Taking Active   Blood Glucose Monitoring Suppl DEVI [id number] Yes Check blood sugars twice daily. E11.9 [name], FNP Taking Active   Continuous Blood Gluc Sensor (FREESTYLE LIBRE 2 SENSOR) [place] [id number] Yes Use to check blood  sugars daily [name], FNP Taking Active   diclofenac sodium (VOLTAREN) 1 % GEL [id number] Yes Apply 2 g topically 4 (four) times daily. [name], FNP Taking Active   Glucagon (GVOKE HYPOPEN 2-PACK) 0.5 MG/0.1ML Darden Palmer [id number] Yes Inject 0.5 mg into the skin as needed. [name], FNP Taking Active   glucose blood (ACCU-CHEK GUIDE) test strip [id number] Yes Use as instructed [name], FNP Taking Active   insulin aspart (FIASP FLEXTOUCH) 100 UNIT/ML FlexTouch Pen [id number] Yes Inject into the skin.  Use as directed per sliding scale three times daily [provider] Taking Active   Insulin Glargine (BASAGLAR KWIKPEN) 100 UNIT/ML [id number] Yes Inject 30 Units into the skin at bedtime. [provider] Taking Active            Med Note Shara Blazing Mar 31, 2019  2:49 PM) Ralph Leyden patient assistance  Insulin Pen Needle (B-D UF III MINI PEN NEEDLES) 31G X 5 MM MISC [id number] Yes USE AS DIRECTED DAILY [name], FNP Taking Active   Lancets (ACCU-CHEK SOFT TOUCH) lancets [id number] Yes Use as instructed [name], FNP Taking Active   losartan (COZAAR) 50 MG tablet [id number] Yes TAKE ONE TABLET BY MOUTH ONCE DAILY [name], FNP Taking Active   magnesium 30 MG tablet [id number] Yes Take 30 mg by mouth once a week. [provider] Taking Active   meloxicam (MOBIC) 15 MG tablet [id number] Yes TK 1 T PO QD AS NEEDED WITH FOOD [provider] Taking Active   niacin (NIASPAN) 500 MG CR tablet [id number] Yes TAKE ONE TABLET BY MOUTH EVERYDAY AT BEDTIME [name], FNP Taking Active   potassium chloride (KLOR-CON 10) 10 MEQ tablet [id number] Yes Take 1 tablet (10 mEq total) by mouth daily. [name], FNP Taking Active   sitaGLIPtin (JANUVIA) 100 MG tablet [id number] Yes Take 1 tablet (100 mg total) by mouth daily. [name], FNP Taking Active           Patient Active Problem List   Diagnosis Date Noted  . Cognitive deficits 11/07/2018   . ETOH abuse 07/30/2013  . Anxiety 07/30/2013  . Essential hypertension 01/31/2011    Immunization History  Administered Date(s) Administered  . Fluad Quad(high Dose 65+) 10/20/2019  . Janssen (J&J) SARS-COV-2 Vaccination 03/17/2019  . Tdap 01/25/2013, 07/29/2013    Conditions to be addressed/monitored:  Hyperlipidemia and Diabetes  Care Plan : [place]  Updates made by Mayford Knife, RPH since 05/05/2020 12:00 AM    Problem: HTN, HLD, DM   Priority: High    Long-Range Goal: Disease Management   This Visit's Progress: Not on track  Priority: High  Note:    Current Barriers:  . Unable to independently afford treatment regimen . Unable to achieve control of Diabetes    Pharmacist Clinical Goal(s):  [name] Patient will verbalize ability to afford treatment regimen . achieve adherence to monitoring guidelines and medication adherence to achieve therapeutic efficacy through collaboration with PharmD and provider.   Interventions: . 1:1 collaboration with [name], FNP regarding development and update of comprehensive plan of care as evidenced by provider attestation and co-signature . Inter-disciplinary care team collaboration (see longitudinal plan of care) . Comprehensive medication review performed; medication list updated in electronic medical record  Hyperlipidemia: (LDL goal < 92) -Uncontrolled -Current treatment: . Atorvastatin 80 mg tablet once per day  o Patient reports that she has a full bottle  -Patient reports that she did eat on the day of taking her lab work  -She is open to starting an additional medication if necessary  -Current dietary patterns: she has been doing well with her eating habits. She reports cutting out fried and fatty foods.  -Current exercise habits: She is exercising three times per week, for 30-45 minutes each session  -Educated on Cholesterol goals;  Benefits of statin for ASCVD risk reduction; Importance of limiting  foods high in cholesterol; Exercise goal of 150 minutes per week; -Recommended to continue current medication  Diabetes (A1c goal <7%) -Uncontrolled -Current medications: . Fiasp inject into the skin a needed following the sliding scale  o 300 and above inject 14 units  - Patient reports having one pen of FIASP left, she does not use all the time  . Patient has not tried to fill medication at [facility]  - Usually BS between 265-300  . Insulin Glargine 100 unit/ml inject 30 units into the skin at bedtime  . Januvia 100 mg tablet once per day  -Current home glucose readings . fasting glucose: 146-159  . post prandial glucose: 300  -Denies hypoglycemic/hyperglycemic symptoms -Current meal patterns:  . breakfast: instant oatmeal with apples - 1 packet, cheerios, boiled egg, using [employer] Whole Milk - full glass . lunch: does not eat lunch - tuna salad  . dinner: sometimes baked chicken with greens, and macaroni & cheese, sometimes fish and salad  . snacks: green grapes , peanut butter   . drinks: Coffee - [employer] - she enjoys in her coffee that she does not measure  -Current exercise:  -Educated on A1c and blood sugar goals; Exercise goal of 150 minutes per week; Proper insulin injection technique; Prevention and management of hypoglycemic episodes; Benefits of routine self-monitoring of blood sugar; Carbohydrate counting and/or plate method -Patient has completed signing patient assistance paperwork for Fiasp but her financial information was not completed. Will ask if patient is able to bring her financial information back so that the patient assistance paperwork can be submitted.  -Counseled to check feet daily and get yearly eye exams -Recommended to continue current medication   Patient Goals/Self-Care Activities . Patient will:  - take medications as prescribed  Follow Up Plan: The patient has been provided with contact information for the care management  team and has been advised to call with any health related questions or concerns.       Medication Assistance: Application for [place]  medication assistance program. in process.  Anticipated assistance start date 05/2020.  See plan of care for additional detail.  Patient's preferred pharmacy is:  [id number] [address], [address] [place] [address] Phone: [phone] Fax: [phone]  [facility] - [place], [place] - [address] [place] [address] Phone: [phone] Fax: [phone]  Uses pill box? No - will discuss during next office visit Pt endorses 90% compliance  We discussed: Benefits of medication synchronization, packaging and delivery as well as enhanced pharmacist oversight with [facility]. Patient decided to: Utilize [facility] pharmacy for medication synchronization, packaging and delivery  Care Plan and Follow Up Patient Decision:  Patient agrees to Care Plan and Follow-up.  Plan: The patient has been provided with contact information for the care management team and has been advised to call with any health related questions or concerns.   [name], PharmD Clinical Pharmacist [facility] [phone]

## 2020-05-05 ENCOUNTER — Other Ambulatory Visit: Payer: Self-pay | Admitting: Nurse Practitioner

## 2020-05-05 ENCOUNTER — Ambulatory Visit (INDEPENDENT_AMBULATORY_CARE_PROVIDER_SITE_OTHER): Payer: Medicare PPO | Admitting: Nurse Practitioner

## 2020-05-05 ENCOUNTER — Encounter: Payer: Self-pay | Admitting: Nurse Practitioner

## 2020-05-05 ENCOUNTER — Other Ambulatory Visit: Payer: Self-pay

## 2020-05-05 ENCOUNTER — Ambulatory Visit
Admission: RE | Admit: 2020-05-05 | Discharge: 2020-05-05 | Disposition: A | Discharge status: Home / Self Care | Payer: Medicare PPO | Source: Ambulatory Visit | Attending: Nurse Practitioner | Admitting: Nurse Practitioner

## 2020-05-05 VITALS — BP 142/80 | HR 74 | Temp 98.2°F | Ht 63.6 in | Wt 124.0 lb

## 2020-05-05 DIAGNOSIS — R222 Localized swelling, mass and lump, trunk: Secondary | ICD-10-CM

## 2020-05-05 DIAGNOSIS — R0781 Pleurodynia: Secondary | ICD-10-CM | POA: Diagnosis not present

## 2020-05-05 DIAGNOSIS — R0789 Other chest pain: Secondary | ICD-10-CM | POA: Diagnosis not present

## 2020-05-05 MED ORDER — GABAPENTIN 100 MG PO CAPS: 100.0000 mg | ORAL_CAPSULE | Freq: Three times a day (TID) | ORAL | 0 refills | Status: DC

## 2020-05-05 MED ORDER — KETOROLAC TROMETHAMINE 60 MG/2ML IM SOLN
60.0000 mg | Freq: Once | INTRAMUSCULAR | Status: AC
  Administered 2020-05-05: 60 mg via INTRAMUSCULAR

## 2020-05-05 NOTE — Progress Notes (Signed)
I,Yamilka Roman [employer] as a Education administrator for [employer], FNP.,have documented all relevant documentation on the behalf of [name], FNP,as directed by  [name], FNP while in the presence of [name], [place]. This visit occurred during the SARS-CoV-2 public health emergency.  Safety protocols were in place, including screening questions prior to the visit, additional usage of staff PPE, and extensive cleaning of exam room while observing appropriate contact time as indicated for disinfecting solutions.  Subjective:     Patient ID: Gabriella Acosta , female    DOB: 06/20/1945 , 75 y.o.   MRN: [id number]   Chief Complaint  Patient presents with  . Abdominal Pain    Patient stated she has a lump on her right side that has been there for about 3 weeks now. She stated the lump has gotten bigger and its painful.      HPI  Patient presents today for a lump on her side that has been there for 3 weeks. She stated the lump has increased in size and it shoots a sharp pain across her stomach. She has been going to the gym prior to having this pain, she stopped and applied heat and ice. Thought she had pulled a muscle. Last week her pain got worse. Denies shortness of breath. Scale of 0-10, is 10/10. She has taken meloxicam without good effect.    Wt Readings from Last 3 Encounters: 05/05/20 : 124 lb (56.2 kg) 03/25/20 : 124 lb 9.6 oz (56.5 kg) 10/20/19 : 125 lb 12.8 oz (57.1 kg)       Past Medical History:  Diagnosis Date  . Hypertension      Family History  Problem Relation Age of Onset  . Diabetes Mother   . Diabetes Father   . Heart disease Father      Current Outpatient Medications:  .  amLODipine (NORVASC) 5 MG tablet, Take one tablet by mouth daily, Disp: 30 tablet, Rfl: 0 .  atorvastatin (LIPITOR) 80 MG tablet, Take 1 tablet (80 mg total) by mouth daily., Disp: 90 tablet, Rfl: 1 .  Blood Glucose Monitoring Suppl DEVI, Check blood sugars twice daily. E11.9, Disp: 1  kit, Rfl: 0 .  Continuous Blood Gluc Sensor (FREESTYLE LIBRE 2 SENSOR) MISC, Use to check blood sugars daily, Disp: 1 each, Rfl: 5 .  diclofenac sodium (VOLTAREN) 1 % GEL, Apply 2 g topically 4 (four) times daily., Disp: 100 g, Rfl: 1 .  gabapentin (NEURONTIN) 100 MG capsule, Take 1 capsule (100 mg total) by mouth 3 (three) times daily., Disp: 90 capsule, Rfl: 0 .  Glucagon (GVOKE HYPOPEN 2-PACK) 0.5 MG/0.1ML SOAJ, Inject 0.5 mg into the skin as needed., Disp: 0.1 mL, Rfl: 2 .  glucose blood (ACCU-CHEK GUIDE) test strip, Use as instructed, Disp: 100 each, Rfl: 12 .  insulin aspart (FIASP FLEXTOUCH) 100 UNIT/ML FlexTouch Pen, Inject into the skin. Use as directed per sliding scale three times daily, Disp: , Rfl:  .  Insulin Glargine (BASAGLAR KWIKPEN) 100 UNIT/ML, Inject 30 Units into the skin at bedtime., Disp: , Rfl:  .  Insulin Pen Needle (B-D UF III MINI PEN NEEDLES) 31G X 5 MM MISC, USE AS DIRECTED DAILY, Disp: 100 each, Rfl: 3 .  Lancets (ACCU-CHEK SOFT TOUCH) lancets, Use as instructed, Disp: 100 each, Rfl: 12 .  losartan (COZAAR) 50 MG tablet, TAKE ONE TABLET BY MOUTH ONCE DAILY, Disp: 34 tablet, Rfl: 2 .  magnesium 30 MG tablet, Take 30 mg by mouth once a week.,  Disp: , Rfl:  .  meloxicam (MOBIC) 15 MG tablet, TK 1 T PO QD AS NEEDED WITH FOOD, Disp: , Rfl:  .  niacin (NIASPAN) 500 MG CR tablet, TAKE ONE TABLET BY MOUTH EVERYDAY AT BEDTIME, Disp: 30 tablet, Rfl: 3 .  potassium chloride (KLOR-CON 10) 10 MEQ tablet, Take 1 tablet (10 mEq total) by mouth daily., Disp: 90 tablet, Rfl: 1 .  sitaGLIPtin (JANUVIA) 100 MG tablet, Take 1 tablet (100 mg total) by mouth daily., Disp: 30 tablet, Rfl: 0   No Known Allergies   Review of Systems  Respiratory: Negative.   Cardiovascular: Positive for chest pain (side chest pain underneath ribs). Negative for palpitations and leg swelling.  Gastrointestinal: Negative.   Endocrine: Negative for polydipsia, polyphagia and polyuria.  Skin:       Swelling  to right upper side of abdomen  Psychiatric/Behavioral: Negative.      Today's Vitals   05/05/20 0951  BP: (!) 142/80  Pulse: 74  Temp: 98.2 F (36.8 C)  TempSrc: Oral  Weight: 124 lb (56.2 kg)  Height: 5' 3.6" (1.615 m)  PainSc: 10-Worst pain ever  PainLoc: Abdomen   Body mass index is 21.55 kg/m.   Objective:  Physical Exam Constitutional:      General: She is not in acute distress.    Appearance: Normal appearance. She is normal weight.  Cardiovascular:     Rate and Rhythm: Normal rate and regular rhythm.     Pulses: Normal pulses.     Heart sounds: Normal heart sounds. No murmur heard.   Pulmonary:     Effort: Pulmonary effort is normal. No respiratory distress.     Breath sounds: Normal breath sounds. No wheezing.  Abdominal:     General: Abdomen is flat. Bowel sounds are normal.     Palpations: Abdomen is soft.     Tenderness: There is abdominal tenderness (lateral right side of chest underneath ribs).  Musculoskeletal:        General: Normal range of motion.     Cervical back: Normal range of motion and neck supple.  Skin:    General: Skin is warm and dry.     Capillary Refill: Capillary refill takes less than 2 seconds.  Neurological:     General: No focal deficit present.     Mental Status: She is alert and oriented to person, place, and time.  Psychiatric:        Mood and Affect: Mood normal.        Behavior: Behavior normal.        Thought Content: Thought content normal.        Judgment: Judgment normal.         Assessment And Plan:     1. Rib pain on right side  She has discomfort intermittently, tenderness to right lateral rib at about 10  Will treat with toradol   Rx for gabapentin sent as well to pharmacy - ketorolac (TORADOL) injection 60 mg - DG Chest 2 View; Future - DG Ribs Unilateral Right; Future - [place] CHEST SOFT TISSUE; Future - gabapentin (NEURONTIN) 100 MG capsule; Take 1 capsule (100 mg total) by mouth 3 (three) times daily.   Dispense: 90 capsule; Refill: 0  2. Lump in chest  Soft lump palpated at right rib 10  Will check ultrasound  She does have a history of smoking will plan to do a CT scan lung low dose - [place] CHEST SOFT TISSUE; Future     Patient  was given opportunity to ask questions. Patient verbalized understanding of the plan and was able to repeat key elements of the plan. All questions were answered to their satisfaction.  [name], FNP   I, [name], FNP, have reviewed all documentation for this visit. The documentation on 05/05/20 for the exam, diagnosis, procedures, and orders are all accurate and complete.   IF YOU HAVE BEEN REFERRED TO A SPECIALIST, IT MAY TAKE 1-2 WEEKS TO SCHEDULE/PROCESS THE REFERRAL. IF YOU HAVE NOT HEARD FROM US/SPECIALIST IN TWO WEEKS, PLEASE GIVE [place] A CALL AT [phone] X [phone].   THE PATIENT IS ENCOURAGED TO PRACTICE SOCIAL DISTANCING DUE TO THE COVID-19 PANDEMIC.

## 2020-05-05 NOTE — Patient Instructions (Addendum)
Visit Information It was great speaking with you today!  Please let me know if you have any questions about our visit.  Goals Addressed            This Visit's Progress   . Manage My Medicine       Timeframe:  Long-Range Goal Priority:  High Start Date:                             Expected End Date:                       Follow Up Date 05/18/2020   - call for medicine refill 2 or 3 days before it runs out - keep a list of all the medicines I take; vitamins and herbals too - use a pillbox to sort medicine - use an alarm clock or phone to remind me to take my medicine    Why is this important?   . These steps will help you keep on track with your medicines.          Patient Care Plan: Social Work [facility] care plan    Problem Identified: Barriers to Treatment     Long-Range Goal: Barriers to Treatment Identified and Managed   Start Date: 01/29/2020  Expected End Date: 05/28/2020  Recent Progress: Not on track  Priority: Low  Note:   Current Barriers:  . Financial constraints related to cost of medical care . Limited social support . Chronic conditions including DM II, HTN, and Mixed Hyperlipidemia which put patient at increased risk of hospitalization  Social Work Clinical Goal(s):  [name] Over the next 30 days patient will complete Medicaid application  Goal not met . New 3.9.22 Over the next 45 days the patient will work with SW to obtain a Medicaid application  Interventions: . 1:1 collaboration with [name], [place] regarding development and update of comprehensive plan of care as evidenced by provider attestation and co-signature . Inter-disciplinary care team collaboration (see longitudinal plan of care) . Successful outbound call placed to the patient to assess goal progression . Determined the patient is not sure if she received a Medicaid application via mail . Reviewed patient income level- patient slightly over limit to obtain Medicaid . Advised the patient  there is a chance she will qualify for MQB Medicaid . Provided the patient with the contact number to [place] to complete a Medicaid application . Patient reports she is almost out of insulin . Performed chart review to note the patient spoke with pharmacy team today who has faxed in patient assistance application to [facility] . Advised the patient the pharmacy team is actively working to obtain insulin on behalf of the patient . Scheduled follow up call over the next month  Patient Goals/Self-Care Activities Over the next 45 days, patient will:   - Patient will self administer medications as prescribed Patient will attend all scheduled provider appointments Patient will call provider office for new concerns or questions Complete and submit Medicaid application Contact SW as needed prior to next scheduled call  Follow up Plan: SW will follow up with patient by phone over the next  month    Patient Care Plan: Diabetes Type 2 (Adult)    Problem Identified: Glycemic Management (Diabetes, Type 2)   Priority: High    Long-Range Goal: Glycemic Management Optimized   Start Date: 02/09/2020  Expected End Date: 08/06/2020  This  Visit's Progress: On track  Recent Progress: On track  Priority: High  Note:   CARE PLAN ENTRY (see longitudinal plan of care for additional care plan information)  Current Barriers:  [name] Knowledge Deficits related to disease process and Self Health Management of Diabetes . Chronic Disease Management support and education needs related to type 2 Diabetes, HTN, Hyperlipidemia Nurse Case Manager Clinical Goal(s):  [name] Over the next 180 days, patient will work with CCM team and PCP to address needs related to disease education and support for improved Self Health management of DM CCM RN CM Interventions:  03/22/20 success call completed with patient  . Inter-disciplinary care team collaboration (see longitudinal plan of care) . Evaluation of current  treatment plan related to Diabetes and patient's adherence to plan as established by provider. . Educated patient re: current A1c has increased to 14.0%; Educated on target A1c 7.5; Educated on dietary and exercise recommendations; Educated on daily glycemic control FBS 80-130; <180 after meals; Educated on 15'15' rule  . Determined patient now has the [facility] and is monitoring and recording her CBG's 3-4 times daily, average readings are above target range, states running in the 200's . Determined patient is newly established with the embedded Pharm D to help manage her DM . Reviewed medications with patient and discussed patient is adhering to taking her prescribed medications w/o noted SE or missed doses:  Basaglar Kwikpen 30 units at bedtime  Januvia 131m daily  Fiasp three times daily per sliding scale . Reinforced the importance of medication adherence w/o missed doses and keeping all scheduled PCP f/u appointments  . Confirmed patient received the mailed educational booklet related to Living Well with Diabetes  . Advised patient, providing education and rationale, to check cbg 2-3 daily before meals and record, calling the CCM team and PCP for findings outside established parameters . Discussed plans with patient for ongoing care management follow up and provided patient with direct contact information for care management team Patient Self Care Activities:  Over the next 180 days, patient will:  Monitor blood sugars before meals and at bedtime  Take DM medications exactly as prescribed  Adhere to ADA diet as discussed  Attend all scheduled provider appointments Call pharmacy for medication refills Call provider office for new concerns or questions  Follow up Plan: RNCM will follow up with patient by phone on or around 04/13/20    Patient Care Plan: [place]   Problem Identified: HTN, HLD, DM   Priority: High    Long-Range Goal: Disease Management   This  Visit's Progress: Not on track  Priority: High  Note:    Current Barriers:  . Unable to independently afford treatment regimen . Unable to achieve control of Diabetes    Pharmacist Clinical Goal(s):  .Marland KitchenPatient will verbalize ability to afford treatment regimen . achieve adherence to monitoring guidelines and medication adherence to achieve therapeutic efficacy through collaboration with PharmD and provider.   Interventions: . 1:1 collaboration with MMinette Brine FNP regarding development and update of comprehensive plan of care as evidenced by provider attestation and co-signature . Inter-disciplinary care team collaboration (see longitudinal plan of care) . Comprehensive medication review performed; medication list updated in electronic medical record  Hyperlipidemia: (LDL goal < 92) -Uncontrolled -Current treatment: . Atorvastatin 80 mg tablet once per day  o Patient reports that she has a full bottle  -Patient reports that she did eat on the day of taking her lab work  -  She is open to starting an additional medication if necessary  -Current dietary patterns: she has been doing well with her eating habits. She reports cutting out fried and fatty foods.  -Current exercise habits: She is exercising three times per week, for 30-45 minutes each session  -Educated on Cholesterol goals;  Benefits of statin for ASCVD risk reduction; Importance of limiting foods high in cholesterol; Exercise goal of 150 minutes per week; -Recommended to continue current medication   Diabetes (A1c goal <7%) -Uncontrolled -Current medications: . Fiasp inject into the skin a needed following the sliding scale  o 300 and above inject 14 units  - Patient reports having one pen of FIASP left, she does not use all the time  . Patient has not tried to fill medication at [facility]  - Usually BS between 265-300  . Insulin Glargine 100 unit/ml inject 30 units into the skin at bedtime  . Januvia 100 mg tablet  once per day  -Current home glucose readings . fasting glucose: 146-159  . post prandial glucose: 300  -Denies hypoglycemic/hyperglycemic symptoms -Current meal patterns:  . breakfast: instant oatmeal with apples - 1 packet, cheerios, boiled egg, using [employer] Whole Milk - full glass . lunch: does not eat lunch - tuna salad  . dinner: sometimes baked chicken with greens, and macaroni & cheese, sometimes fish and salad  . snacks: green grapes , peanut butter   . drinks: Coffee - [employer] - she enjoys in her coffee that she does not measure  -Current exercise:  -Educated on A1c and blood sugar goals; Exercise goal of 150 minutes per week; Proper insulin injection technique; Prevention and management of hypoglycemic episodes; Benefits of routine self-monitoring of blood sugar; Carbohydrate counting and/or plate method -Patient has completed signing patient assistance paperwork for Fiasp but her financial information was not completed. Will ask if patient is able to bring her financial information back so that the patient assistance paperwork can be submitted.  -Counseled to check feet daily and get yearly eye exams -Recommended to continue current medication   Patient Goals/Self-Care Activities . Patient will:  - take medications as prescribed  Follow Up Plan: The patient has been provided with contact information for the care management team and has been advised to call with any health related questions or concerns.       Patient agreed to services and verbal consent obtained.   The patient verbalized understanding of instructions, educational materials, and care plan provided today and agreed to receive a mailed copy of patient instructions, educational materials, and care plan.   [name], PharmD Clinical Pharmacist [facility] [phone]

## 2020-05-06 ENCOUNTER — Ambulatory Visit
Admission: RE | Admit: 2020-05-06 | Discharge: 2020-05-06 | Disposition: A | Discharge status: Home / Self Care | Payer: Medicare PPO | Source: Ambulatory Visit | Attending: Nurse Practitioner | Admitting: Nurse Practitioner

## 2020-05-06 DIAGNOSIS — R222 Localized swelling, mass and lump, trunk: Secondary | ICD-10-CM | POA: Diagnosis not present

## 2020-05-06 DIAGNOSIS — R0781 Pleurodynia: Secondary | ICD-10-CM

## 2020-05-10 ENCOUNTER — Other Ambulatory Visit: Payer: Self-pay

## 2020-05-10 ENCOUNTER — Encounter: Payer: Self-pay | Admitting: Internal Medicine

## 2020-05-10 ENCOUNTER — Ambulatory Visit (INDEPENDENT_AMBULATORY_CARE_PROVIDER_SITE_OTHER): Payer: Medicare PPO | Admitting: Internal Medicine

## 2020-05-10 VITALS — BP 124/61 | HR 74 | Ht 64.0 in | Wt 123.6 lb

## 2020-05-10 DIAGNOSIS — R0989 Other specified symptoms and signs involving the circulatory and respiratory systems: Secondary | ICD-10-CM | POA: Diagnosis not present

## 2020-05-10 DIAGNOSIS — E1165 Type 2 diabetes mellitus with hyperglycemia: Secondary | ICD-10-CM | POA: Diagnosis not present

## 2020-05-10 DIAGNOSIS — E782 Mixed hyperlipidemia: Secondary | ICD-10-CM | POA: Diagnosis not present

## 2020-05-10 MED ORDER — ICOSAPENT ETHYL 1 G PO CAPS: 2.0000 g | ORAL_CAPSULE | Freq: Two times a day (BID) | ORAL | 3 refills | Status: DC

## 2020-05-10 NOTE — Patient Instructions (Addendum)
Medication Instructions:  START: VASCEPA 2gm TWICE DAILY  *If you need a refill on your cardiac medications before your next appointment, please call your pharmacy*  Lab Work: REPEAT: LIPID PANEL (CHOLESTEROL) IN 3 MONTHS. RIGHT BEFORE NEXT APPOINTMENT- YOU WILL NEED TO FAST PRIOR If you have labs (blood work) drawn today and your tests are completely normal, you will receive your results only by: [name] MyChart Message (if you have MyChart) OR . A paper copy in the mail If you have any lab test that is abnormal or we need to change your treatment, we will call you to review the results.  Testing/Procedures: Your physician has requested that you have a carotid duplex. This test is an ultrasound of the carotid arteries in your neck. It looks at blood flow through these arteries that supply the brain with blood. Allow one hour for this exam. There are no restrictions or special instructions.  Follow-Up: At [facility], you and your health needs are our priority.  As part of our continuing mission to provide you with exceptional heart care, we have created designated Provider Care Teams.  These Care Teams include your primary Cardiologist (physician) and Advanced Practice Providers (APPs -  Physician Assistants and Nurse Practitioners) who all work together to provide you with the care you need, when you need it.  Your next appointment:   3 month(s) LIPID CLINIC  The format for your next appointment:   In Person  Provider:   K. Italy Hilty, MD  Other Instructions  Patient Assistance:  The [employer] foundation offers assistance to help pay for medication copays.  They will cover copays for all cholesterol lowering meds, including statins, fibrates, omega-3 oils, ezetimibe, Repatha, Praluent, Nexletol, Nexlizet.  The cards are usually good for $2,500 or 12 months, whichever comes first. 1. Go to healthwellfoundation.org 2. Click on "Apply Now" 3. Answer questions as to whom is applying  (patient or representative) 4. Your disease fund will be "hypercholesterolemia - Medicare access" 5. They will ask questions about finances and which medications you are taking for cholesterol 6. When you submit, the approval is usually within minutes.  You will need to print the card information from the site 7. You will need to show this information to your pharmacy, they will bill your Medicare Part D plan first -then bill [employer] --for the copay.   You can also call them at [phone], although the hold times can be quite long.   Thank you for choosing [facility]
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
## 2020-05-11 ENCOUNTER — Telehealth: Payer: Self-pay

## 2020-05-11 ENCOUNTER — Ambulatory Visit (INDEPENDENT_AMBULATORY_CARE_PROVIDER_SITE_OTHER): Payer: Medicare PPO

## 2020-05-11 DIAGNOSIS — E1165 Type 2 diabetes mellitus with hyperglycemia: Secondary | ICD-10-CM

## 2020-05-11 DIAGNOSIS — I10 Essential (primary) hypertension: Secondary | ICD-10-CM

## 2020-05-11 DIAGNOSIS — R222 Localized swelling, mass and lump, trunk: Secondary | ICD-10-CM

## 2020-05-11 NOTE — Patient Instructions (Signed)
Social Worker Visit Information  Goals we discussed today:  Goals Addressed            This Visit's Progress   . Barriers to Treatment Identified and Managed       Timeframe:  Long-Range Goal Priority:  Low Start Date: 1.20.22                            Expected End Date: 5.20.22                      Next planned outreach: 6.3.22  Patient Goals/Self-Care Activities Over the next 45 days, patient will:   - Patient will self administer medications as prescribed Patient will attend all scheduled provider appointments Patient will call provider office for new concerns or questions Follow up with her primary provider to develop a treatment plan for lump on side Contact SW as needed prior to next scheduled call        Follow Up Plan: SW will follow up with patient by phone over the next month.   Bevelyn Ngo, BSW, CDP Social Worker, Certified Dementia Practitioner [facility] Care Management [phone]

## 2020-05-11 NOTE — Telephone Encounter (Addendum)
**Note De-identified Jusitn Salsgiver Obfuscation** -----  **Note De-Identified Nahia Nissan Obfuscation** Message from Lindell Spar, RN sent at 05/10/2020  2:34 PM EDT ----- Regarding: Vascepa Prior Auth Dr. Rennis Golden started this patient on Vascepa 2 gram PO BID on 05/10/20. PA will be needed. Insurance card scanned to chart.

## 2020-05-11 NOTE — Chronic Care Management (AMB) (Signed)
Chronic Care Management    Social Work Note  05/11/2020 Name: Gabriella Acosta MRN: [id number] DOB: 1945/09/24  Gabriella Acosta is a 75 y.o. year old female who is a primary care patient of [name], [place]. The CCM team was consulted to assist the patient with chronic disease management and/or care coordination needs related to: [employer] .   Engaged with patient by telephone for follow up visit in response to provider referral for social work chronic care management and care coordination services.   Consent to Services:  The patient was given information about Chronic Care Management services, agreed to services, and gave verbal consent prior to initiation of services.  Please see initial visit note for detailed documentation.   Patient agreed to services and consent obtained.   Assessment: Review of patient past medical history, allergies, medications, and health status, including review of relevant consultants reports was performed today as part of a comprehensive evaluation and provision of chronic care management and care coordination services.     SDOH (Social Determinants of Health) assessments and interventions performed:    Advanced Directives Status: Not addressed in this encounter.  CCM Care Plan  No Known Allergies  Outpatient Encounter Medications as of 05/11/2020  Medication Sig  . amLODipine (NORVASC) 5 MG tablet Take one tablet by mouth daily  . atorvastatin (LIPITOR) 80 MG tablet Take 1 tablet (80 mg total) by mouth daily.  . Blood Glucose Monitoring Suppl DEVI Check blood sugars twice daily. E11.9  . Continuous Blood Gluc Sensor (FREESTYLE LIBRE 2 SENSOR) MISC Use to check blood sugars daily  . diclofenac sodium (VOLTAREN) 1 % GEL Apply 2 g topically 4 (four) times daily.  [name] gabapentin (NEURONTIN) 100 MG capsule Take 1 capsule (100 mg total) by mouth 3 (three) times daily.  . Glucagon (GVOKE HYPOPEN 2-PACK) 0.5 MG/0.1ML SOAJ Inject 0.5 mg into the  skin as needed.  [name] glucose blood (ACCU-CHEK GUIDE) test strip Use as instructed  . icosapent Ethyl (VASCEPA) 1 g capsule Take 2 capsules (2 g total) by mouth 2 (two) times daily.  . insulin aspart (FIASP FLEXTOUCH) 100 UNIT/ML FlexTouch Pen Inject into the skin. Use as directed per sliding scale three times daily  . Insulin Glargine (BASAGLAR KWIKPEN) 100 UNIT/ML Inject 30 Units into the skin at bedtime.  . Insulin Pen Needle (B-D UF III MINI PEN NEEDLES) 31G X 5 MM MISC USE AS DIRECTED DAILY  . Lancets (ACCU-CHEK SOFT TOUCH) lancets Use as instructed  . losartan (COZAAR) 50 MG tablet TAKE ONE TABLET BY MOUTH ONCE DAILY  . magnesium 30 MG tablet Take 30 mg by mouth once a week.  . meloxicam (MOBIC) 15 MG tablet TK 1 T PO QD AS NEEDED WITH FOOD  . niacin (NIASPAN) 500 MG CR tablet TAKE ONE TABLET BY MOUTH EVERYDAY AT BEDTIME  . potassium chloride (KLOR-CON 10) 10 MEQ tablet Take 1 tablet (10 mEq total) by mouth daily.  . sitaGLIPtin (JANUVIA) 100 MG tablet Take 1 tablet (100 mg total) by mouth daily.   No facility-administered encounter medications on file as of 05/11/2020.    Patient Active Problem List   Diagnosis Date Noted  . Cognitive deficits 11/07/2018  . ETOH abuse 07/30/2013  . Anxiety 07/30/2013  . Essential hypertension 01/31/2011    Conditions to be addressed/monitored: HTN and DMII  Care Plan : Social Work [facility] care plan  Updates made by Daneen Schick since 05/11/2020 12:00 AM    Problem: Barriers to Treatment  Long-Range Goal: Barriers to Treatment Identified and Managed   Start Date: 01/29/2020  Expected End Date: 05/28/2020  Recent Progress: Not on track  Priority: Low  Note:   Current Barriers:  . Financial constraints related to cost of medical care . Limited social support . Chronic conditions including DM II, HTN, and Mixed Hyperlipidemia which put patient at increased risk of hospitalization  Social Work Clinical Goal(s):  [name] Over the next 30 days patient  will complete Medicaid application  Goal not met . New 3.9.22 Over the next 45 days the patient will work with SW to obtain a Medicaid application 3.6.64 Goal on hold due to other patient priorities . New 5.3.22 Over the next 45 days the patient will follow up with her primary care provider to develop a treatment plan to address lump on side  Interventions: . 1:1 collaboration with [name], [place] regarding development and update of comprehensive plan of care as evidenced by provider attestation and co-signature . Inter-disciplinary care team collaboration (see longitudinal plan of care) . Successful outbound call placed to the patient to assess goal progression . Determined the patient has yet to follow up on Medicaid application and is not interested in persuing at this time due to being busy with other health related needs . Discussed the patient has a lump on her side that is painful, she would like this lump removed . Patient reports she has had an x-ray and ultrasound of the area and believes her next step is a CT scan . Performed chart review- unable to locate orders for a CT scan . Advised the patient SW would contact her primary physician to confirm next steps for the patient . Collaboration with [name] FNP to reports patients pain to the area and patients desire for removal. Requested patient follow up to review next steps . Collaboration with [place] to inform of interventions and plan . Scheduled follow up call over the next month  Patient Goals/Self-Care Activities Over the next 45 days, patient will:   - Patient will self administer medications as prescribed Patient will attend all scheduled provider appointments Patient will call provider office for new concerns or questions Follow up with her primary provider to develop a treatment plan for lump on side Contact SW as needed prior to next scheduled call  Follow up Plan: SW will follow up with  patient by phone over the next  month       Follow Up Plan: SW will follow up with patient by phone over the next month  Daneen Schick, BSW, CDP Social Worker, Certified Dementia Practitioner [place] [facility] [place] [facility] [phone]  Total time spent performing care coordination and/or care management activities with the patient by phone or face to face = 23 minutes.

## 2020-05-11 NOTE — Telephone Encounter (Signed)
**Note De-Identified Gabriella Acosta Obfuscation** I started a Vascepa PA through covermymeds and received the following message: [employer]'s covered (formulary) drugs are Vascepa (brand).  I called [facility] pharmacy and was advised that they are running the pts RX as name brand Vascepa and the pts co-pay is $1.35 for a 30 day supply of Vascepa.  Per pharmacist they are filling the pts Vascepa RX and will let pt know she can pick up.

## 2020-05-13 ENCOUNTER — Other Ambulatory Visit: Payer: Self-pay

## 2020-05-13 ENCOUNTER — Other Ambulatory Visit: Payer: Self-pay | Admitting: Nurse Practitioner

## 2020-05-13 ENCOUNTER — Telehealth: Payer: Self-pay

## 2020-05-13 ENCOUNTER — Telehealth: Payer: Medicare PPO

## 2020-05-13 DIAGNOSIS — R1010 Upper abdominal pain, unspecified: Secondary | ICD-10-CM

## 2020-05-13 DIAGNOSIS — E1165 Type 2 diabetes mellitus with hyperglycemia: Secondary | ICD-10-CM | POA: Diagnosis not present

## 2020-05-13 DIAGNOSIS — R0781 Pleurodynia: Secondary | ICD-10-CM

## 2020-05-13 MED ORDER — AMLODIPINE BESYLATE 5 MG PO TABS: ORAL_TABLET | ORAL | 0 refills | Status: DC

## 2020-05-13 NOTE — Chronic Care Management (AMB) (Addendum)
Chronic Care Management Pharmacy Assistant   Name: Gabriella Acosta  MRN: [id number] DOB: 03-01-1945   Reason for Encounter: Medication Review/ Medication coordination    Recent office visits: 05-05-2020 Gabriella Felts, FNP. STOP Diflucan 100mg  (therapy completed). START Gabapentin 100mg  3 times daily 05-11-2020 (CCM)  Recent consult visits:  05-10-2020 Gabriella Ngo, MD (cardiology) START Vascepa 1gram take 2grams twice daily.   Hospital visits:  None in previous 6 months  Medications: Outpatient Encounter Medications as of 05/13/2020  Medication Sig   amLODipine (NORVASC) 5 MG tablet Take one tablet by mouth daily   atorvastatin (LIPITOR) 80 MG tablet Take 1 tablet (80 mg total) by mouth daily.   Blood Glucose Monitoring Suppl DEVI Check blood sugars twice daily. E11.9   Continuous Blood Gluc Sensor (FREESTYLE LIBRE 2 SENSOR) MISC Use to check blood sugars daily   diclofenac sodium (VOLTAREN) 1 % GEL Apply 2 g topically 4 (four) times daily.   gabapentin (NEURONTIN) 100 MG capsule Take 1 capsule (100 mg total) by mouth 3 (three) times daily.   Glucagon (GVOKE HYPOPEN 2-PACK) 0.5 MG/0.1ML SOAJ Inject 0.5 mg into the skin as needed.   glucose blood (ACCU-CHEK GUIDE) test strip Use as instructed   icosapent Ethyl (VASCEPA) 1 g capsule Take 2 capsules (2 g total) by mouth 2 (two) times daily.   insulin aspart (FIASP FLEXTOUCH) 100 UNIT/ML FlexTouch Pen Inject into the skin. Use as directed per sliding scale three times daily   Insulin Glargine (BASAGLAR KWIKPEN) 100 UNIT/ML Inject 30 Units into the skin at bedtime.   Insulin Pen Needle (B-D UF III MINI PEN NEEDLES) 31G X 5 MM MISC USE AS DIRECTED DAILY   Lancets (ACCU-CHEK SOFT TOUCH) lancets Use as instructed   losartan (COZAAR) 50 MG tablet TAKE ONE TABLET BY MOUTH ONCE DAILY   magnesium 30 MG tablet Take 30 mg by mouth once a week.   meloxicam (MOBIC) 15 MG tablet TK 1 T PO QD AS NEEDED WITH FOOD   niacin  (NIASPAN) 500 MG CR tablet TAKE ONE TABLET BY MOUTH EVERYDAY AT BEDTIME   potassium chloride (KLOR-CON 10) 10 MEQ tablet Take 1 tablet (10 mEq total) by mouth daily.   sitaGLIPtin (JANUVIA) 100 MG tablet Take 1 tablet (100 mg total) by mouth daily.   No facility-administered encounter medications on file as of 05/13/2020.   Reviewed chart for medication changes ahead of medication coordination call.   No medication changes indicated OR if recent visit, treatment plan here.  BP Readings from Last 3 Encounters:  05/10/20 124/61  05/05/20 (!) 142/80  03/25/20 114/80    Lab Results  Component Value Date   HGBA1C 14.1 (H) 03/25/2020     Patient obtains medications through Adherence Packaging  90 Days   Last adherence delivery included: None  Patient declined (meds) last month: Gvoke Hypopen 0.5 mg- Inject 0.5 mg into skin as needed Potassium Chloride 10 mg- 1 tablet daily (before breakfast) Atorvastatin 80 mg- 1 tablet daily (before breakfast)  Losartan 50 mg- 1 tablet daily (before breakfast)  Amlodipine 5 mg- 1 tablet daily (before breakfast)    Last filled 02/23/2020 & 02/24/2020 for a 90 day supply. Niacin Er 500 mg- 1 tablet daily (bedtime) Patient is due for next adherence delivery on: 05-24-2020.  Telmisartan 20 mg- 1 tablet daily due to being discontinued Fluconazole 100 mg- due completing therapy Freestyle Libre sensors- not needed due to receiving from [place] [facility].  05-26-2020 Flextouch- medication  discontinued, change in therapy, patient using Fiasp, Hospital doctor and Januvia - getting through [employer] and [employer] patient assistance programs. Called patient and reviewed medications and coordinated delivery. Accu-check Guide- notneeded  Meloxicam 7.5 mg- discontinued BD Ultra-fine Mini Pen needles- not needed  This delivery to include: Atorvastatin 80 mg- 1 tablet daily (before breakfast)  Potassium Chloride 10 mg- 1 tablet daily (before breakfast) Amlodipine 5  mg- 1 tablet daily (before breakfast) Niacin Er 500 mg- 1 tablet daily (bedtime)  No short fill or acute fill needed  Patient declined the following medications (meds): Gvoke Hypopen 0.5 mg- Inject 0.5 mg into skin as needed Losartan 50 mg- 1 tablet daily (before breakfast)   Patient needs refills for Amlodipine 5mg  daily. Refill request sent  Confirmed delivery date of 05-24-2020, advised patient that pharmacy will contact them the morning of delivery.  Star Rating Drugs: Atorvastatin 80 mg- Last filled 02/24/2020 for 90 day supply at 02/26/2020. Losartan 50 mg- Last filled 02/23/2020 for 90 day supply at 02/25/2020.  Telmisartan 20 mg- Last filled 03-11-2020 [facility] Tradjenta 5 mg- Last filled 10-02-2018 [facility]   10-04-2018 [facility]  [phone]  I have reviewed the care management and care coordination activities outlined in this encounter and I am certifying that I agree with the content of this note. No further action required.  [phone], [facility] 05/21/20 12:37 PM

## 2020-05-13 NOTE — Chronic Care Management (AMB) (Signed)
Left patient VM  to inform her the Januvia PAP needed her signature and income verification to proceed and to stop by PCP office to complete it next week per Billee Cashing. Asked patient to call back with day and time she will be stopping by.   Malecca Willa [facility]  [phone]

## 2020-05-17 ENCOUNTER — Telehealth: Payer: Self-pay

## 2020-05-17 NOTE — Chronic Care Management (AMB) (Signed)
Called patient for an appointment reminder with [name], RPH on 05-18-20 at 12:15. Instructed patient to have all meds/supplements and logs available for review. Patient voiced understanding.  Gabriella Acosta [facility]  [phone]

## 2020-05-18 ENCOUNTER — Telehealth: Payer: Self-pay

## 2020-05-18 NOTE — Telephone Encounter (Signed)
Called patient for CCM follow up visit. No answer at the time. Will try to call again. Total Time 4 minutes.

## 2020-05-18 NOTE — Progress Notes (Incomplete)
Chronic Care Management Pharmacy Note  05/18/2020 Name:  Gabriella Acosta MRN:  [id number] DOB:  November 01, 1945  Subjective: Gabriella Acosta is an 75 y.o. year old female who is a primary patient of [name], [place].  The CCM team was consulted for assistance with disease management and care coordination needs.    {CCMTELEPHONEFACETOFACE:21091510} for {CCMINITIALFOLLOWUPCHOICE:21091511} in response to provider referral for pharmacy case management and/or care coordination services.   Consent to Services:  {CCMCONSENTOPTIONS:25074}  Patient Care Team: [name], FNP as PCP - General (General Practice) Daneen Schick as Social Worker Elanda Garmany, Sharyn Blitz, [facility] (Pharmacist)  Recent office visits: ***  Recent consult visits: [facility] visits: {Hospital DC Yes/No:25215}  Objective:  Lab Results  Component Value Date   CREATININE 0.67 03/25/2020   BUN 10 03/25/2020   GFRNONAA 88 10/20/2019   GFRAA 102 10/20/2019   NA 136 03/25/2020   K 4.7 03/25/2020   CALCIUM 10.3 03/25/2020   CO2 21 03/25/2020   GLUCOSE 411 (H) 03/25/2020    Lab Results  Component Value Date/Time   HGBA1C 14.1 (H) 03/25/2020 12:13 PM   HGBA1C 14.0 (H) 10/20/2019 10:22 AM   MICROALBUR 30 12/10/2018 05:09 PM   MICROALBUR 150 10/31/2017 12:47 PM    Last diabetic Eye exam:  Lab Results  Component Value Date/Time   HMDIABEYEEXA No Retinopathy 09/02/2019 12:00 AM    Last diabetic Foot exam: No results found for: HMDIABFOOTEX   Lab Results  Component Value Date   CHOL 185 03/25/2020   HDL 40 03/25/2020   LDLCALC 91 03/25/2020   TRIG 324 (H) 03/25/2020   CHOLHDL 4.6 (H) 03/25/2020    Hepatic Function Latest Ref Rng & Units 03/25/2020 10/20/2019 07/30/2019  Total Protein 6.0 - 8.5 g/dL 7.4 7.7 8.0  Albumin 3.7 - 4.7 g/dL 4.6 4.5 4.5  AST 0 - 40 IU/L '30 18 22  ' ALT 0 - 32 IU/L 39(H) 21 18  Alk Phosphatase 44 - 121 IU/L 161(H) 185(H) 179(H)  Total Bilirubin 0.0 - 1.2 mg/dL 0.3 0.3 0.2     Lab Results  Component Value Date/Time   TSH 1.650 10/20/2019 10:22 AM    CBC Latest Ref Rng & Units 10/20/2019 07/30/2019 10/02/2018  WBC 3.4 - 10.8 x10E3/uL 12.8(H) 9.9 9.8  Hemoglobin 11.1 - 15.9 g/dL 13.5 12.6 13.8  Hematocrit 34.0 - 46.6 % 42.2 37.7 41.0  Platelets 150 - 450 x10E3/uL 132(L) 92(LL) 103(L)    No results found for: VD25OH  Clinical ASCVD: {YES/NO:21197} The 10-year ASCVD risk score Mikey Bussing DC Jr., et al., 2013) is: 41.2%   Values used to calculate the score:     Age: 39 years     Sex: Female     Is Non-Hispanic African American: No     Diabetic: Yes     Tobacco smoker: Yes     Systolic Blood Pressure: 782 mmHg     Is BP treated: Yes     HDL Cholesterol: 40 mg/dL     Total Cholesterol: 185 mg/dL    Depression screen [facility] 2/9 07/30/2019 02/04/2019 12/10/2018  Decreased Interest 0 0 0  Down, Depressed, Hopeless 0 0 0  PHQ - 2 Score 0 0 0  Altered sleeping - - -  Tired, decreased energy - - -  Change in appetite - - -  Feeling bad or failure about yourself  - - -  Trouble concentrating - - -  Moving slowly or fidgety/restless - - -  Suicidal thoughts - - -  PHQ-9 Score - - -  Difficult doing work/chores - - -     ***Other: (CHADS2VASc if Afib, MMRC or CAT for COPD, ACT, DEXA)  Social History   Tobacco Use  Smoking Status Current Every Day Smoker  . Packs/day: 0.25  . Types: Cigarettes  Smokeless Tobacco Current User   BP Readings from Last 3 Encounters:  05/10/20 124/61  05/05/20 (!) 142/80  03/25/20 114/80   Pulse Readings from Last 3 Encounters:  05/10/20 74  05/05/20 74  03/25/20 63   Wt Readings from Last 3 Encounters:  05/10/20 123 lb 9.6 oz (56.1 kg)  05/05/20 124 lb (56.2 kg)  03/25/20 124 lb 9.6 oz (56.5 kg)   BMI Readings from Last 3 Encounters:  05/10/20 21.22 kg/m  05/05/20 21.55 kg/m  03/25/20 21.52 kg/m    Assessment/Interventions: Review of patient past medical history, allergies, medications, health status,  including review of consultants reports, laboratory and other test data, was performed as part of comprehensive evaluation and provision of chronic care management services.   SDOH:  (Social Determinants of Health) assessments and interventions performed: {yes/no:20286}  SDOH Screenings   Alcohol Screen: Not on file  Depression (PHQ2-9): Low Risk   . PHQ-2 Score: 0  Financial Resource Strain: High Risk  . Difficulty of Paying Living Expenses: Hard  Food Insecurity: No Food Insecurity  . Worried About Charity fundraiser in the Last Year: Never true  . Ran Out of Food in the Last Year: Never true  Housing: Low Risk   . Last Housing Risk Score: 0  Physical Activity: Inactive  . Days of Exercise per Week: 0 days  . Minutes of Exercise per Session: 0 min  Social Connections: Not on file  Stress: No Stress Concern Present  . Feeling of Stress : Only a little  Tobacco Use: High Risk  . Smoking Tobacco Use: Current Every Day Smoker  . Smokeless Tobacco Use: Current User  Transportation Needs: No Transportation Needs  . Lack of Transportation (Medical): No  . Lack of Transportation (Non-Medical): No    CCM Care Plan  No Known Allergies  Medications Reviewed Today    Reviewed by Lubertha Sayres, CMA (Certified Medical Assistant) on 05/10/20 at 1348  Med List Status: <None>  Medication Order Taking? Sig Documenting Provider Last Dose Status Informant  amLODipine (NORVASC) 5 MG tablet [id number] Yes Take one tablet by mouth daily [name], FNP Taking Active   atorvastatin (LIPITOR) 80 MG tablet [id number] Yes Take 1 tablet (80 mg total) by mouth daily. [name], FNP Taking Active   Blood Glucose Monitoring Suppl DEVI [id number] Yes Check blood sugars twice daily. E11.9 [name], FNP Taking Active   Continuous Blood Gluc Sensor (FREESTYLE LIBRE 2 SENSOR) [place] [id number] Yes Use to check blood sugars daily [name], FNP Taking Active   diclofenac sodium (VOLTAREN) 1 %  GEL [id number] Yes Apply 2 g topically 4 (four) times daily. [name], FNP Taking Active   gabapentin (NEURONTIN) 100 MG capsule [id number] Yes Take 1 capsule (100 mg total) by mouth 3 (three) times daily. [name], FNP Taking Active   Glucagon (GVOKE HYPOPEN 2-PACK) 0.5 MG/0.1ML Darden Palmer [id number] Yes Inject 0.5 mg into the skin as needed. [name], FNP Taking Active   glucose blood (ACCU-CHEK GUIDE) test strip [id number] Yes Use as instructed [name], FNP Taking Active   insulin aspart (FIASP FLEXTOUCH) 100 UNIT/ML FlexTouch Pen [id number] Yes Inject into the skin. Use as directed per sliding scale  three times daily [provider] Taking Active   Insulin Glargine (BASAGLAR KWIKPEN) 100 UNIT/ML [id number] Yes Inject 30 Units into the skin at bedtime. [provider] Taking Active            Med Note Suszanne Finch May 10, 2020  1:48 PM)    Insulin Pen Needle (B-D UF III MINI PEN NEEDLES) 31G X 5 MM MISC [id number] Yes USE AS DIRECTED DAILY [name], FNP Taking Active   Lancets (ACCU-CHEK SOFT TOUCH) lancets [id number] Yes Use as instructed [name], FNP Taking Active   losartan (COZAAR) 50 MG tablet [id number] Yes TAKE ONE TABLET BY MOUTH ONCE DAILY [name], FNP Taking Active   magnesium 30 MG tablet [id number] Yes Take 30 mg by mouth once a week. [provider] Taking Active   meloxicam (MOBIC) 15 MG tablet [id number] Yes TK 1 T PO QD AS NEEDED WITH FOOD [provider] Taking Active   niacin (NIASPAN) 500 MG CR tablet [id number] Yes TAKE ONE TABLET BY MOUTH EVERYDAY AT BEDTIME [name], FNP Taking Active   potassium chloride (KLOR-CON 10) 10 MEQ tablet [id number] Yes Take 1 tablet (10 mEq total) by mouth daily. [name], FNP Taking Active   sitaGLIPtin (JANUVIA) 100 MG tablet [id number] Yes Take 1 tablet (100 mg total) by mouth daily. [name], FNP Taking Active           Patient Active Problem List    Diagnosis Date Noted  . Cognitive deficits 11/07/2018  . ETOH abuse 07/30/2013  . Anxiety 07/30/2013  . Essential hypertension 01/31/2011    Immunization History  Administered Date(s) Administered  . Fluad Quad(high Dose 65+) 10/20/2019  . Janssen (J&J) SARS-COV-2 Vaccination 03/17/2019  . Tdap 01/25/2013, 07/29/2013    Conditions to be addressed/monitored:  {USCCMDZASSESSMENTOPTIONS:23563}  There are no care plans that you recently modified to display for this patient.    Medication Assistance: {MEDASSISTANCEINFO:25044}  Patient's preferred pharmacy is:  [facility] [place], [place] [address] [address] [place] [address] Phone: [phone] Fax: [phone]  [facility] - [place], [place] - [address] [place] [address] Phone: [phone] Fax: [phone]  Uses pill box? {Yes or If no, why not?:20788} Pt endorses ***% compliance  We discussed: {Pharmacy options:24294} Patient decided to: {US Pharmacy Plan:23885}  Care Plan and Follow Up Patient Decision:  {FOLLOWUP:24991}  Plan: {CM FOLLOW UP ITGP:49826}  ***   Current Barriers:  . {pharmacybarriers:24917}  Pharmacist Clinical Goal(s):  [name] Patient will {PHARMACYGOALCHOICES:24921} through collaboration with PharmD and provider.   Interventions: . 1:1 collaboration with [name], FNP regarding development and update of comprehensive plan of care as evidenced by provider attestation and co-signature . Inter-disciplinary care team collaboration (see longitudinal plan of care) . Comprehensive medication review performed; medication list updated in electronic medical record  {CCM PHARMD DISEASE STATES:25130}  Patient Goals/Self-Care Activities . Patient will:  - {pharmacypatientgoals:24919}  Follow Up Plan: {CM FOLLOW UP EBRA:30940}

## 2020-05-20 ENCOUNTER — Telehealth: Payer: Self-pay

## 2020-05-20 NOTE — Telephone Encounter (Signed)
Opened in error

## 2020-05-24 ENCOUNTER — Other Ambulatory Visit: Payer: Self-pay

## 2020-05-24 ENCOUNTER — Ambulatory Visit (HOSPITAL_COMMUNITY)
Admission: RE | Admit: 2020-05-24 | Discharge: 2020-05-24 | Disposition: A | Discharge status: Home / Self Care | Payer: Medicare PPO | Source: Ambulatory Visit | Attending: Cardiology | Admitting: Cardiology

## 2020-05-24 ENCOUNTER — Other Ambulatory Visit: Payer: Self-pay | Admitting: Internal Medicine

## 2020-05-24 DIAGNOSIS — E782 Mixed hyperlipidemia: Secondary | ICD-10-CM | POA: Diagnosis not present

## 2020-05-24 DIAGNOSIS — R0989 Other specified symptoms and signs involving the circulatory and respiratory systems: Secondary | ICD-10-CM

## 2020-05-24 DIAGNOSIS — I6523 Occlusion and stenosis of bilateral carotid arteries: Secondary | ICD-10-CM

## 2020-05-26 ENCOUNTER — Emergency Department (HOSPITAL_COMMUNITY): Payer: Medicare PPO

## 2020-05-26 ENCOUNTER — Other Ambulatory Visit: Payer: Self-pay

## 2020-05-26 ENCOUNTER — Other Ambulatory Visit: Payer: Self-pay | Admitting: Nurse Practitioner

## 2020-05-26 ENCOUNTER — Ambulatory Visit
Admission: RE | Admit: 2020-05-26 | Discharge: 2020-05-26 | Disposition: A | Discharge status: Home / Self Care | Payer: Medicare PPO | Source: Ambulatory Visit | Attending: Nurse Practitioner | Admitting: Nurse Practitioner

## 2020-05-26 ENCOUNTER — Emergency Department (HOSPITAL_COMMUNITY)
Admission: EM | Admit: 2020-05-26 | Discharge: 2020-05-26 | Disposition: A | Discharge status: Home / Self Care | Payer: Medicare PPO | Attending: Emergency Medicine | Admitting: Emergency Medicine

## 2020-05-26 ENCOUNTER — Encounter (HOSPITAL_COMMUNITY): Payer: Self-pay

## 2020-05-26 DIAGNOSIS — Z79899 Other long term (current) drug therapy: Secondary | ICD-10-CM | POA: Diagnosis not present

## 2020-05-26 DIAGNOSIS — F17210 Nicotine dependence, cigarettes, uncomplicated: Secondary | ICD-10-CM | POA: Diagnosis not present

## 2020-05-26 DIAGNOSIS — I700 Atherosclerosis of aorta: Secondary | ICD-10-CM | POA: Diagnosis not present

## 2020-05-26 DIAGNOSIS — I10 Essential (primary) hypertension: Secondary | ICD-10-CM | POA: Diagnosis not present

## 2020-05-26 DIAGNOSIS — R0789 Other chest pain: Secondary | ICD-10-CM | POA: Insufficient documentation

## 2020-05-26 DIAGNOSIS — R1010 Upper abdominal pain, unspecified: Secondary | ICD-10-CM

## 2020-05-26 DIAGNOSIS — R109 Unspecified abdominal pain: Secondary | ICD-10-CM | POA: Diagnosis not present

## 2020-05-26 DIAGNOSIS — R0781 Pleurodynia: Secondary | ICD-10-CM

## 2020-05-26 DIAGNOSIS — R100 Acute abdomen: Secondary | ICD-10-CM | POA: Diagnosis not present

## 2020-05-26 DIAGNOSIS — K5730 Diverticulosis of large intestine without perforation or abscess without bleeding: Secondary | ICD-10-CM | POA: Diagnosis not present

## 2020-05-26 DIAGNOSIS — K8689 Other specified diseases of pancreas: Secondary | ICD-10-CM

## 2020-05-26 LAB — COMPREHENSIVE METABOLIC PANEL
ALT: 21 U/L (ref 0–44)
AST: 26 U/L (ref 15–41)
Albumin: 4 g/dL (ref 3.5–5.0)
Alkaline Phosphatase: 122 U/L (ref 38–126)
Anion gap: 10 (ref 5–15)
BUN: 17 mg/dL (ref 8–23)
CO2: 26 mmol/L (ref 22–32)
Calcium: 9.8 mg/dL (ref 8.9–10.3)
Chloride: 97 mmol/L — ABNORMAL LOW (ref 98–111)
Creatinine, Ser: 0.86 mg/dL (ref 0.44–1.00)
GFR, Estimated: >60 mL/min (ref >60)
Glucose, Bld: 352 mg/dL — ABNORMAL HIGH (ref 70–99)
Potassium: 4.9 mmol/L (ref 3.5–5.1)
Sodium: 133 mmol/L — ABNORMAL LOW (ref 135–145)
Total Bilirubin: 0.9 mg/dL (ref 0.3–1.2)
Total Protein: 7.8 g/dL (ref 6.5–8.1)

## 2020-05-26 LAB — CBC WITH DIFFERENTIAL/PLATELET
Abs Immature Granulocytes: 0.04 10*3/uL (ref 0.00–0.07)
Basophils Absolute: 0 10*3/uL (ref 0.0–0.1)
Basophils Relative: 0 %
Eosinophils Absolute: 0.1 10*3/uL (ref 0.0–0.5)
Eosinophils Relative: 1 %
HCT: 40.5 % (ref 36.0–46.0)
Hemoglobin: 13.4 g/dL (ref 12.0–15.0)
Immature Granulocytes: 0 %
Lymphocytes Relative: 24 %
Lymphs Abs: 2.5 10*3/uL (ref 0.7–4.0)
MCH: 28.5 pg (ref 26.0–34.0)
MCHC: 33.1 g/dL (ref 30.0–36.0)
MCV: 86 fL (ref 80.0–100.0)
Monocytes Absolute: 0.6 10*3/uL (ref 0.1–1.0)
Monocytes Relative: 6 %
Neutro Abs: 7.3 10*3/uL (ref 1.7–7.7)
Neutrophils Relative %: 69 %
Platelets: 120 10*3/uL — ABNORMAL LOW (ref 150–400)
RBC: 4.71 MIL/uL (ref 3.87–5.11)
RDW: 13.5 % (ref 11.5–15.5)
WBC: 10.6 10*3/uL — ABNORMAL HIGH (ref 4.0–10.5)
nRBC: 0 % (ref 0.0–0.2)

## 2020-05-26 LAB — LIPASE, BLOOD: Lipase: 35 U/L (ref 11–51)

## 2020-05-26 MED ORDER — OXYCODONE-ACETAMINOPHEN 5-325 MG PO TABS: 1.0000 | ORAL_TABLET | Freq: Four times a day (QID) | ORAL | 0 refills | Status: DC | PRN

## 2020-05-26 MED ORDER — HYDROMORPHONE HCL 1 MG/ML IJ SOLN
0.5000 mg | INTRAMUSCULAR | Status: DC | PRN
  Administered 2020-05-26: 0.5 mg via INTRAVENOUS
  Filled 2020-05-26: qty 1

## 2020-05-26 NOTE — ED Provider Notes (Signed)
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  She states that she would like to get this removed so she has been referred to [facility] surgery.  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  The radiologist stated that none of this looks new.  She has been referred to GI   Bethann Berkshire, MD 05/26/20 1715
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
## 2020-05-26 NOTE — Discharge Instructions (Signed)
Call make an appointment with [facility] to look at that area to the right side of your chest which most likely is a fatty tumor.  Call and follow-up with a gastroenterologist for the findings on your CT that shows you have had some pancreas problems before.  You can follow-up with [facility] or you can follow-up with what ever gastroenterologist you would like to see

## 2020-05-26 NOTE — ED Triage Notes (Signed)
Pt arrived via POV, c/o painful lump to right flank that is now radiating to stomach. States this has been going on for about a month. Has CT this morning but no results yet.

## 2020-05-31 ENCOUNTER — Ambulatory Visit: Payer: Medicare PPO | Admitting: Internal Medicine

## 2020-05-31 ENCOUNTER — Telehealth: Payer: Self-pay

## 2020-05-31 ENCOUNTER — Other Ambulatory Visit: Payer: Self-pay | Admitting: Nurse Practitioner

## 2020-05-31 DIAGNOSIS — R222 Localized swelling, mass and lump, trunk: Secondary | ICD-10-CM

## 2020-05-31 NOTE — Telephone Encounter (Signed)
Called pt back about msg left in regards to referral. Pt was given location of place, she will call to make an appt.
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
## 2020-06-01 ENCOUNTER — Telehealth: Payer: Self-pay
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
## 2020-06-10 ENCOUNTER — Other Ambulatory Visit: Payer: Self-pay | Admitting: Nurse Practitioner

## 2020-06-10 DIAGNOSIS — R1010 Upper abdominal pain, unspecified: Secondary | ICD-10-CM

## 2020-06-10 MED ORDER — OXYCODONE-ACETAMINOPHEN 5-325 MG PO TABS: 1.0000 | ORAL_TABLET | Freq: Four times a day (QID) | ORAL | 0 refills | Status: DC | PRN

## 2020-06-11 ENCOUNTER — Ambulatory Visit (INDEPENDENT_AMBULATORY_CARE_PROVIDER_SITE_OTHER): Payer: Medicare PPO

## 2020-06-11 DIAGNOSIS — I10 Essential (primary) hypertension: Secondary | ICD-10-CM

## 2020-06-11 DIAGNOSIS — E1165 Type 2 diabetes mellitus with hyperglycemia: Secondary | ICD-10-CM

## 2020-06-11 DIAGNOSIS — E782 Mixed hyperlipidemia: Secondary | ICD-10-CM

## 2020-06-11 DIAGNOSIS — R1010 Upper abdominal pain, unspecified: Secondary | ICD-10-CM

## 2020-06-11 DIAGNOSIS — R222 Localized swelling, mass and lump, trunk: Secondary | ICD-10-CM

## 2020-06-11 NOTE — Patient Instructions (Signed)
Social Worker Visit Information  Goals we discussed today:  Goals Addressed            This Visit's Progress   . Barriers to Treatment Identified and Managed   On track    Timeframe:  Long-Range Goal Priority:  Low Start Date: 1.20.22                            Expected End Date: 5.20.22                      Next planned outreach: 7.12.22  Patient Goals/Self-Care Activities Over the next 45 days, patient will:   - Attend upcomming appointment with [facility] -Take medication as prescribed Contact SW as needed prior to next scheduled call        Follow Up Plan: SW will follow up with patient by phone over the next 45 days   Bevelyn Ngo, BSW, CDP Social Worker, Certified Dementia Practitioner [facility] Care Management [phone]

## 2020-06-11 NOTE — Chronic Care Management (AMB) (Signed)
Chronic Care Management    Social Work Note  06/11/2020 Name: Gabriella Acosta MRN: [id number] DOB: [birth year]  Gabriella Acosta is a 75 y.o. year old female who is a primary care patient of [name], [place]. The CCM team was consulted to assist the patient with chronic disease management and/or care coordination needs related to: [employer] .   Engaged with patient by telephone for follow up visit in response to provider referral for social work chronic care management and care coordination services.   Consent to Services:  The patient was given information about Chronic Care Management services, agreed to services, and gave verbal consent prior to initiation of services.  Please see initial visit note for detailed documentation.   Patient agreed to services and consent obtained.   Assessment: Review of patient past medical history, allergies, medications, and health status, including review of relevant consultants reports was performed today as part of a comprehensive evaluation and provision of chronic care management and care coordination services.     SDOH (Social Determinants of Health) assessments and interventions performed:    Advanced Directives Status: Not addressed in this encounter.  CCM Care Plan  No Known Allergies  Outpatient Encounter Medications as of 06/11/2020  Medication Sig  . amLODipine (NORVASC) 5 MG tablet Take one tablet by mouth daily  . atorvastatin (LIPITOR) 80 MG tablet Take 1 tablet (80 mg total) by mouth daily.  . Blood Glucose Monitoring Suppl DEVI Check blood sugars twice daily. E11.9  . Continuous Blood Gluc Sensor (FREESTYLE LIBRE 2 SENSOR) MISC Use to check blood sugars daily  . diclofenac sodium (VOLTAREN) 1 % GEL Apply 2 g topically 4 (four) times daily.  [name] gabapentin (NEURONTIN) 100 MG capsule Take 1 capsule (100 mg total) by mouth 3 (three) times daily.  . Glucagon (GVOKE HYPOPEN 2-PACK) 0.5 MG/0.1ML SOAJ Inject 0.5 mg into the  skin as needed.  [name] glucose blood (ACCU-CHEK GUIDE) test strip Use as instructed  . icosapent Ethyl (VASCEPA) 1 g capsule Take 2 capsules (2 g total) by mouth 2 (two) times daily.  . insulin aspart (FIASP FLEXTOUCH) 100 UNIT/ML FlexTouch Pen Inject into the skin. Use as directed per sliding scale three times daily  . Insulin Glargine (BASAGLAR KWIKPEN) 100 UNIT/ML Inject 30 Units into the skin at bedtime.  . Insulin Pen Needle (B-D UF III MINI PEN NEEDLES) 31G X 5 MM MISC USE AS DIRECTED DAILY  . Lancets (ACCU-CHEK SOFT TOUCH) lancets Use as instructed  . losartan (COZAAR) 50 MG tablet TAKE ONE TABLET BY MOUTH ONCE DAILY  . magnesium 30 MG tablet Take 30 mg by mouth once a week.  . meloxicam (MOBIC) 15 MG tablet TK 1 T PO QD AS NEEDED WITH FOOD  . niacin (NIASPAN) 500 MG CR tablet TAKE ONE TABLET BY MOUTH EVERYDAY AT BEDTIME  . oxyCODONE-acetaminophen (PERCOCET) 5-325 MG tablet Take 1 tablet by mouth every 6 (six) hours as needed.  . potassium chloride (KLOR-CON 10) 10 MEQ tablet Take 1 tablet (10 mEq total) by mouth daily.  . sitaGLIPtin (JANUVIA) 100 MG tablet Take 1 tablet (100 mg total) by mouth daily.   No facility-administered encounter medications on file as of 06/11/2020.    Patient Active Problem List   Diagnosis Date Noted  . Cognitive deficits 11/07/2018  . ETOH abuse 07/30/2013  . Anxiety 07/30/2013  . Essential hypertension 01/31/2011    Conditions to be addressed/monitored: HTN, DMII and Mixed Hyperlipidemia; Treatment barriers to address lump on side  Care Plan : Social Work [facility] care plan  Updates made by Daneen Schick since 06/11/2020 12:00 AM    Problem: Barriers to Treatment     Long-Range Goal: Barriers to Treatment Identified and Managed   Start Date: 01/29/2020  Expected End Date: 05/28/2020  This Visit's Progress: On track  Recent Progress: Not on track  Priority: High  Note:   Current Barriers:  . Financial constraints related to cost of medical  care . Limited social support . Chronic conditions including DM II, HTN, and Mixed Hyperlipidemia which put patient at increased risk of hospitalization  Social Work Clinical Goal(s):  [name] Over the next 30 days patient will complete Medicaid application  Goal not met . New 3.9.22 Over the next 45 days the patient will work with SW to obtain a Medicaid application [id number] Goal on hold due to other patient priorities . New 5.3.22 Over the next 45 days the patient will follow up with her primary care provider to develop a treatment plan to address lump on side  Interventions: . 1:1 collaboration with [name], [place] regarding development and update of comprehensive plan of care as evidenced by provider attestation and co-signature . Inter-disciplinary care team collaboration (see longitudinal plan of care) . Successful outbound call placed to the patient to assess goal progression . Determined the patient has completed CT scan  . Performed chart review patient was referred to [facility] . Discussed patient has an appointment on 6.23.22 with [facility] - patient denies transportation barriers . Patient reports new medication prescribed by primary care provider on 6.2.22 is helping to alleviate pain . Collaboration with RN Care Manager to update on goal progression . Scheduled follow up call over the next 45 days  Patient Goals/Self-Care Activities Over the next 45 days, patient will:   -  Attend upcomming appointment with [facility] -Take medication as prescribed Contact SW as needed prior to next scheduled call  Follow up Plan: SW will follow up with patient by phone over the next 45 days       Follow Up Plan: SW will follow up with patient by phone over the next 45 days      Daneen Schick, BSW, CDP Social Worker, Certified Dementia Practitioner [place] [facility] [place] [facility] [phone]  Total time spent performing care coordination and/or  care management activities with the patient by phone or face to face = 20 minutes.

## 2020-06-12 DIAGNOSIS — E1165 Type 2 diabetes mellitus with hyperglycemia: Secondary | ICD-10-CM | POA: Diagnosis not present

## 2020-06-14 ENCOUNTER — Other Ambulatory Visit: Payer: Self-pay

## 2020-06-14 ENCOUNTER — Telehealth: Payer: Medicare PPO

## 2020-06-14 ENCOUNTER — Ambulatory Visit: Payer: Self-pay

## 2020-06-14 DIAGNOSIS — E1165 Type 2 diabetes mellitus with hyperglycemia: Secondary | ICD-10-CM | POA: Diagnosis not present

## 2020-06-14 DIAGNOSIS — I10 Essential (primary) hypertension: Secondary | ICD-10-CM | POA: Diagnosis not present

## 2020-06-14 DIAGNOSIS — R1013 Epigastric pain: Secondary | ICD-10-CM | POA: Diagnosis not present

## 2020-06-14 DIAGNOSIS — R0781 Pleurodynia: Secondary | ICD-10-CM | POA: Diagnosis not present

## 2020-06-14 DIAGNOSIS — E782 Mixed hyperlipidemia: Secondary | ICD-10-CM | POA: Diagnosis not present

## 2020-06-14 DIAGNOSIS — K861 Other chronic pancreatitis: Secondary | ICD-10-CM | POA: Diagnosis not present

## 2020-06-14 DIAGNOSIS — R933 Abnormal findings on diagnostic imaging of other parts of digestive tract: Secondary | ICD-10-CM | POA: Diagnosis not present

## 2020-06-14 MED ORDER — SITAGLIPTIN PHOSPHATE 100 MG PO TABS
100.0000 mg | ORAL_TABLET | Freq: Every day | ORAL | 0 refills | Status: DC
Start: 2020-06-14 — End: 2020-08-10

## 2020-06-14 NOTE — Chronic Care Management (AMB) (Signed)
Chronic Care Management   CCM RN Visit Note  06/14/2020 Name: Gabriella Acosta MRN: [id number] DOB: [birth year]  Subjective: Gabriella Acosta is a 75 y.o. year old female who is a primary care patient of [name], [place]. The care management team was consulted for assistance with disease management and care coordination needs.    Engaged with patient by telephone for follow up visit in response to provider referral for case management and/or care coordination services.   Consent to Services:  The patient was given information about Chronic Care Management services, agreed to services, and gave verbal consent prior to initiation of services.  Please see initial visit note for detailed documentation.   Patient agreed to services and verbal consent obtained.   Assessment: Review of patient past medical history, allergies, medications, health status, including review of consultants reports, laboratory and other test data, was performed as part of comprehensive evaluation and provision of chronic care management services.   SDOH (Social Determinants of Health) assessments and interventions performed:  Yes, no acute challenges   CCM Care Plan  No Known Allergies  Outpatient Encounter Medications as of 06/14/2020  Medication Sig  . amLODipine (NORVASC) 5 MG tablet Take one tablet by mouth daily  . atorvastatin (LIPITOR) 80 MG tablet Take 1 tablet (80 mg total) by mouth daily.  . Blood Glucose Monitoring Suppl DEVI Check blood sugars twice daily. E11.9  . Continuous Blood Gluc Sensor (FREESTYLE LIBRE 2 SENSOR) MISC Use to check blood sugars daily  . diclofenac sodium (VOLTAREN) 1 % GEL Apply 2 g topically 4 (four) times daily.  [name] gabapentin (NEURONTIN) 100 MG capsule Take 1 capsule (100 mg total) by mouth 3 (three) times daily.  . Glucagon (GVOKE HYPOPEN 2-PACK) 0.5 MG/0.1ML SOAJ Inject 0.5 mg into the skin as needed.  [name] glucose blood (ACCU-CHEK GUIDE) test strip Use as instructed  .  icosapent Ethyl (VASCEPA) 1 g capsule Take 2 capsules (2 g total) by mouth 2 (two) times daily.  . insulin aspart (FIASP FLEXTOUCH) 100 UNIT/ML FlexTouch Pen Inject into the skin. Use as directed per sliding scale three times daily  . Insulin Glargine (BASAGLAR KWIKPEN) 100 UNIT/ML Inject 30 Units into the skin at bedtime.  . Insulin Pen Needle (B-D UF III MINI PEN NEEDLES) 31G X 5 MM MISC USE AS DIRECTED DAILY  . Lancets (ACCU-CHEK SOFT TOUCH) lancets Use as instructed  . losartan (COZAAR) 50 MG tablet TAKE ONE TABLET BY MOUTH ONCE DAILY  . magnesium 30 MG tablet Take 30 mg by mouth once a week.  . meloxicam (MOBIC) 15 MG tablet TK 1 T PO QD AS NEEDED WITH FOOD  . niacin (NIASPAN) 500 MG CR tablet TAKE ONE TABLET BY MOUTH EVERYDAY AT BEDTIME  . oxyCODONE-acetaminophen (PERCOCET) 5-325 MG tablet Take 1 tablet by mouth every 6 (six) hours as needed.  . potassium chloride (KLOR-CON 10) 10 MEQ tablet Take 1 tablet (10 mEq total) by mouth daily.  . sitaGLIPtin (JANUVIA) 100 MG tablet Take 1 tablet (100 mg total) by mouth daily.   No facility-administered encounter medications on file as of 06/14/2020.    Patient Active Problem List   Diagnosis Date Noted  . Cognitive deficits 11/07/2018  . ETOH abuse 07/30/2013  . Anxiety 07/30/2013  . Essential hypertension 01/31/2011    Conditions to be addressed/monitored:DM II, HTN, Mixed hyperlipidemia  Care Plan : Diabetes Type 2 (Adult)  Updates made by [name], RN since 06/14/2020 12:00 AM    Problem:  Glycemic Management (Diabetes, Type 2)   Priority: High    Long-Range Goal: Glycemic Management Optimized   Start Date: 02/09/2020  Expected End Date: 02/08/2021  Recent Progress: On track  Priority: High  Note:   CARE PLAN ENTRY (see longitudinal plan of care for additional care plan information)  Objective:  Lab Results  Component Value Date   HGBA1C 14.1 (H) 03/25/2020 .   Lab Results  Component Value Date   CREATININE 0.86  05/26/2020   CREATININE 0.67 03/25/2020   CREATININE 0.67 10/20/2019 .   Lab Results  Component Value Date   EGFR 92 03/25/2020 .   Current Barriers:  [name] Knowledge Deficits related to disease process and Self Health Management of Diabetes . Chronic Disease Management support and education needs related to type 2 Diabetes, HTN, Hyperlipidemia Nurse Case Manager Clinical Goal(s):  [name] Patient will work with CCM team and PCP to address needs related to disease education and support for improved Self Health management of DM CCM RN CM Interventions:  06/14/20 completed successful outbound call with patient  . Inter-disciplinary care team collaboration (see longitudinal plan of care) . Evaluation of current treatment plan related to Diabetes and patient's adherence to plan as established by provider. . Educated patient re: current A1c has increased to 14.1%; Educated on target A1c 7.5; Educated on dietary and exercise recommendations; Educated on daily glycemic control FBS 80-130; <180 after meals; Educated on 15'15' rule  . Determined patient now has the [facility] and is monitoring and recording her CBG's 3-4 times daily, average readings are above target range, states running in the 200's . Reviewed medications with patient and discussed multiple medication discrepancies including, patient is taking Basaglar in the am not at bedtime, patient is taking Januvia 25 mg at bedtime and reports she does not have Januvia 100 mg on hand . Per review of patient's prescribed DM regimen, patient SHOULD BE taking:   Basaglar Kwikpen 30 units at bedtime  Januvia 128m daily  Fiasp three times daily per sliding scale . Reinforced the importance of medication adherence w/o missed doses and keeping all scheduled PCP f/u appointments  . Collaborated with PCP to request refill for Januvia 100 mg daily be sent to preferred pharmacy ASAP, reply received from [facility] stating this has been completed . Patient  notified of prescribed regimen and verbalizes understanding; Instructed patient to start the prescribed regimen ASAP and she verbalizes understanding . Advised patient, providing education and rationale, to check cbg 2-3 daily before meals and record, calling the CCM team and PCP for findings outside established parameters . Discussed plans with patient for ongoing care management follow up and provided patient with direct contact information for care management team Patient Self Care Activities:  .Marland KitchenMonitor blood sugars before meals and at bedtime  . Take DM medications exactly as prescribed  . Adhere to ADA diet as discussed  . Attend all scheduled provider appointments . Call pharmacy for medication refills . 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 Inter-disciplinary care team collaboration (see longitudinal plan of care)  patient will work with care management team to address care coordination and chronic disease management needs related to Disease Management  Educational Needs  Care Coordination  Medication Management and Education  Psychosocial Support   Interventions:  06/14/20 completed successful outbound call with patient   Evaluation of current treatment plan related to  DM II, HTN, Mixed hyperlipidemia ,  self-management and patient's adherence to plan as established by provider.  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 Continue to keep all scheduled follow up appointments . Take medications as directed  . Let your healthcare team know if you are unable to take your medications . Call your pharmacy for refills at least 7 days prior to running out of medication . Follow up with general sugery as scheduled on 07/01/20 for surgical removal of fatty tumor Patient Goals: - to recover from surgical removal of a fatty tumor without complications    Follow Up Plan: Telephone follow up appointment with care management team member scheduled for: 07/07/20   Care Plan : Abnormal Weight Loss  Updates made by [name], RN since 06/14/2020 12:00 AM    Problem: Abnormal Weight Loss   Priority: High    Long-Range Goal: Abnormal Weight Loss - evaluated and treated   Start Date: 06/14/2020  Expected End Date: 12/14/2020  Recent Progress: On track  Priority: High  Note:   WEIGHTS:    Wt Readings from Last 3 Encounters:  05/10/20 123 lb 9.6 oz (56.1 kg)  05/05/20 124 lb (56.2 kg)  03/25/20 124  lb 9.6 oz (56.5 kg)   Current Barriers:   Ineffective Self Health Maintenance  Clinical Goal(s):  [name] Collaboration with [name], FNP regarding development and update of comprehensive plan of care as evidenced by provider attestation and co-signature . Inter-disciplinary care team collaboration (see longitudinal plan of care)  patient will work with care management team to address care coordination and chronic disease management needs related to Disease Management  Educational Needs  Care Coordination  Medication Management and Education  Psychosocial Support   Interventions:  06/14/20 completed successful outbound call with patient   Evaluation of current treatment plan related to  Abnormal weight loss , self-management and patient's adherence to plan as established by provider.  Collaboration with [name], FNP regarding development and update of comprehensive plan of care as evidenced by provider attestation       and co-signature  Inter-disciplinary care team collaboration (see longitudinal  plan of care)  Determined patient reports having unintentional weight loss with current weight noted to be 118 lbs   Determined patient reported her concerns to PCP provider and continues to self monitor her weight  Educated patient providing rationale for potential weight loss that can occur from uncontrolled diabetes   Encouraged patient to keep a food journal starting with her first meal of the day and to include all liquids and snacks for review at next [facility] follow up call   Discussed plans with patient for ongoing care management follow up and provided patient with direct contact information for care management team Self Care Activities:  . Continue to keep all scheduled follow up appointments . Take medications as directed  . Let your healthcare team know if you are unable to take your medications . Call your pharmacy for refills at least 7 days prior to running out of  medication . Keep a food journal of all foods, snacks and liquids to review at next [facility] follow up  Patient Goals: - to achieve/maintain adequate weight per BMI weight range   Follow Up Plan: Telephone follow up appointment with care management team member scheduled for: 08/06/20    Care Plan : Hypertriglyceridemia  Updates made by [name], RN since 06/14/2020 12:00 AM    Problem: Hypertriglyceridemia   Priority: High    Long-Range Goal: Hypertriglyceridemia - complications minimized or avoided   Start Date: 06/14/2020  Expected End Date: 06/14/2021  This Visit's Progress: On track  Recent Progress: On track  Priority: High  Note:   Current Barriers:   Ineffective Self Health Maintenance  Clinical Goal(s):  [name] Collaboration with [name], FNP regarding development and update of comprehensive plan of care as evidenced by provider attestation and co-signature . Inter-disciplinary care team collaboration (see longitudinal plan of care)  patient will work with care management team to address care coordination and chronic disease management needs related to Disease Management  Educational Needs  Care Coordination  Medication Management and Education  Psychosocial Support   Interventions:  06/14/20 successful outbound call completed with patient   Evaluation of current treatment plan related to  Mixed Hyperlipidemia , self-management and patient's adherence to plan as established by provider.  Collaboration with [name], FNP regarding development and update of comprehensive plan of care as evidenced by provider attestation       and co-signature  Inter-disciplinary care team collaboration (see longitudinal plan of care) . Provided education to patient about basic disease process related to Hypertriglyceridemia  . Review of patient status, including review of consultant's reports, relevant laboratory and other test results, and medications completed. . Reviewed  medications with patient and discussed importance of medication adherence . Educated on dietary and exercise recommendations  . Determined patient was diagnosed with Pancreatic stones and will follow up with a Surgeon for evaluation and treatment  . Educated patient on potential complication including pancreatic involvement secondary to chronic hypertriglyceridemia  . Mailed printed educational material related to My Cholesterol Guide; What is Hypertriglyceridemia   Discussed plans with patient for ongoing care management follow up and provided patient with direct contact information for care management team Self Care Activities:  . Continue to keep all scheduled follow up appointments . Take medications as directed  . Let your healthcare team know if you are unable to take your medications . Call your pharmacy for refills at least 7 days prior to running out of medication . Follow up with GI Specialist for evaluation  and treatment of Pancreatic stones . Adhere to dietary and exercise recommendations as discussed  Patient Goals: - follow up with GI for evaluation of pancreatic stones   Follow Up Plan: Telephone follow up appointment with care management team member scheduled for: 08/06/20    Care Plan : Smoking Cessation  Updates made by [name], RN since 06/14/2020 12:00 AM    Problem: Smoking Cessation   Priority: High    Long-Range Goal: Smoking Cessation - treatment optimized   Start Date: 06/14/2020  Expected End Date: 06/14/2021  Recent Progress: On track  Priority: High  Note:   Current Barriers:   Ineffective Self Health Maintenance  Clinical Goal(s):  [name] Collaboration with [name], FNP regarding development and update of comprehensive plan of care as evidenced by provider attestation and co-signature . Inter-disciplinary care team collaboration (see longitudinal plan of care)  patient will work with care management team to address care coordination and chronic  disease management needs related to Disease Management  Educational Needs  Care Coordination  Psychosocial Support   Interventions:  06/14/20 completed successful outbound call with patient   Evaluation of current treatment plan related to DM II, HTN, Mixed hyperlipidemia, self-management and patient's adherence to plan as established by provider.  Collaboration with [name], FNP regarding development and update of comprehensive plan of care as evidenced by provider attestation       and co-signature  Inter-disciplinary care team collaboration (see longitudinal plan of care)  Determined patient would like to quit smoking; she has been smoking x 25 years but recently cut back to 3 cigarettes per day   Determined patient would like to work with embedded Pharm D for pharmacological options for treatment cessation   Referral sent to embedded Pharm D requesting assistance with smoking cessation per patient request marked normal routine   Discussed plans with patient for ongoing care management follow up and provided patient with direct contact information for care management team Self Care Activities:  . Continue to keep all scheduled follow up appointments . Take medications as directed  . Let your healthcare team know if you are unable to take your medications . Call your pharmacy for refills at least 7 days prior to running out of medication Patient Goals: - work with embedded Pharm D for treatment options to help quit smoking - develop a plan to quit smoking - quit smoking   Follow Up Plan: Telephone follow up appointment with care management team member scheduled for: 08/06/20     Plan:Telephone follow up appointment with care management team member scheduled for:  08/06/20  Barb Merino, RN, BSN, CCM Care Management Coordinator [place] [facility]  Direct Phone: [phone]

## 2020-06-14 NOTE — Progress Notes (Signed)
This encounter was created in error - please disregard.

## 2020-06-14 NOTE — Patient Instructions (Signed)
Goals Addressed      Patient Stated   .  Get better control over Diabetes (pt-stated)   On track     Timeframe:  Long-Range Goal Priority:  High Start Date: 02/09/20                            Expected End Date: 02/08/21  Follow up date: 08/06/20  Patient Self-Care Activities:  Monitor blood sugars before meals and at bedtime  Take DM medications exactly as prescribed  Adhere to ADA diet as discussed  Attend all scheduled provider appointments Call pharmacy for medication refills Call provider office for new concerns or questions            Other   .  Abnormal Weight Loss - evaluated and treated   On track     Timeframe:  Long-Range Goal Priority:  High Start Date:  06/14/20                           Expected End Date: 12/14/20  Next Scheduled Follow up date: 07/07/20             Self Care Activities:  . Continue to keep all scheduled follow up appointments . Take medications as directed  . Let your healthcare team know if you are unable to take your medications . Call your pharmacy for refills at least 7 days prior to running out of medication . Keep a food journal of all foods, snacks and liquids to review at next [facility] follow up  Patient Goals: - to achieve/maintain adequate weight per BMI weight range               .  Fatty tumor - Surgical Treatment successful   On track     Timeframe:  Short-Term Goal Priority:  High Start Date:  06/14/20                           Expected End Date: 08/13/20     Next Scheduled Follow up call: 07/07/20      Self Care Activities:  . Continue to keep all scheduled follow up appointments . Take medications as directed  . Let your healthcare team know if you are unable to take your medications . Call your pharmacy for refills at least 7 days prior to running out of medication . Follow up with general sugery as scheduled on 07/01/20 for surgical removal of fatty tumor Patient Goals: - to recover from surgical removal of a fatty tumor without  complications                 .  Hypertriglyceridemia - complicationa minimzed or prevented   On track     Timeframe:  Long-Range Goal Priority:  High Start Date:  06/14/20                           Expected End Date:  06/14/21   Next Scheduled follow up date: 08/06/20          Self Care Activities:  . Continue to keep all scheduled follow up appointments . Take medications as directed  . Let your healthcare team know if you are unable to take your medications . Call your pharmacy for refills at least 7 days prior to running out of medication . Follow up with GI  Specialist for evaluation and treatment of Pancreatic stones . Adhere to dietary and exercise recommendations as discussed  Patient Goals: - follow up with GI for evaluation of pancreatic stones               .  Smoking Cessation - treatment optimized   On track     Timeframe:  Long-Range Goal Priority:  High Start Date:  06/14/20                           Expected End Date: 06/14/21  Next Scheduled Follow up date: 08/06/20             Self Care Activities:  . Continue to keep all scheduled follow up appointments . Take medications as directed  . Let your healthcare team know if you are unable to take your medications . Call your pharmacy for refills at least 7 days prior to running out of medication Patient Goals: - work with embedded Pharm D for treatment options to help quit smoking - develop a plan to quit smoking - quit smoking

## 2020-06-24 ENCOUNTER — Telehealth: Payer: Medicare PPO

## 2020-06-24 ENCOUNTER — Ambulatory Visit: Payer: Self-pay

## 2020-06-24 ENCOUNTER — Other Ambulatory Visit: Payer: Self-pay | Admitting: Nurse Practitioner

## 2020-06-24 DIAGNOSIS — E782 Mixed hyperlipidemia: Secondary | ICD-10-CM | POA: Diagnosis not present

## 2020-06-24 DIAGNOSIS — I10 Essential (primary) hypertension: Secondary | ICD-10-CM

## 2020-06-24 DIAGNOSIS — E1165 Type 2 diabetes mellitus with hyperglycemia: Secondary | ICD-10-CM | POA: Diagnosis not present

## 2020-06-24 DIAGNOSIS — R1010 Upper abdominal pain, unspecified: Secondary | ICD-10-CM

## 2020-06-24 MED ORDER — OXYCODONE-ACETAMINOPHEN 5-325 MG PO TABS: 1.0000 | ORAL_TABLET | Freq: Four times a day (QID) | ORAL | 0 refills | Status: DC | PRN

## 2020-06-25 ENCOUNTER — Other Ambulatory Visit: Payer: Self-pay | Admitting: Gastroenterology

## 2020-06-26 NOTE — Patient Instructions (Signed)
Goals Addressed      Acute Pain - treatment optimized   On track    Timeframe:  Short-Term Goal Priority:  High Start Date:  06/24/20                           Expected End Date:  09/24/20  Next Scheduled Follow up date: 07/07/20      Self Care Activities:  Self administers medications as prescribed Attends all scheduled provider appointments Calls pharmacy for medication refills Calls provider office for new concerns or questions Patient Goals: - follow MD recommendations for management of acute pain

## 2020-06-26 NOTE — Chronic Care Management (AMB) (Signed)
Chronic Care Management   CCM RN Visit Note  06/26/2020 Name: Gabriella Acosta MRN: [id number] DOB: Jun 30, 1945  Subjective: Gabriella Acosta is a 75 y.o. year old female who is a primary care patient of Gabriella Felts, FNP. The care management team was consulted for assistance with disease management and care coordination needs.    Engaged with patient by telephone for follow up visit in response to provider referral for case management and/or care coordination services.   Consent to Services:  The patient was given information about Chronic Care Management services, agreed to services, and gave verbal consent prior to initiation of services.  Please see initial visit note for detailed documentation.   Patient agreed to services and verbal consent obtained.   Assessment: Review of patient past medical history, allergies, medications, health status, including review of consultants reports, laboratory and other test data, was performed as part of comprehensive evaluation and provision of chronic care management services.   SDOH (Social Determinants of Health) assessments and interventions performed:    CCM Care Plan  No Known Allergies  Outpatient Encounter Medications as of 06/24/2020  Medication Sig   amLODipine (NORVASC) 5 MG tablet Take one tablet by mouth daily   atorvastatin (LIPITOR) 80 MG tablet Take 1 tablet (80 mg total) by mouth daily.   Blood Glucose Monitoring Suppl DEVI Check blood sugars twice daily. E11.9   Continuous Blood Gluc Sensor (FREESTYLE LIBRE 2 SENSOR) MISC Use to check blood sugars daily   diclofenac sodium (VOLTAREN) 1 % GEL Apply 2 g topically 4 (four) times daily.   gabapentin (NEURONTIN) 100 MG capsule Take 1 capsule (100 mg total) by mouth 3 (three) times daily.   Glucagon (GVOKE HYPOPEN 2-PACK) 0.5 MG/0.1ML SOAJ Inject 0.5 mg into the skin as needed.   glucose blood (ACCU-CHEK GUIDE) test strip Use as instructed   icosapent Ethyl (VASCEPA) 1 g  capsule Take 2 capsules (2 g total) by mouth 2 (two) times daily.   insulin aspart (FIASP FLEXTOUCH) 100 UNIT/ML FlexTouch Pen Inject into the skin. Use as directed per sliding scale three times daily   Insulin Glargine (BASAGLAR KWIKPEN) 100 UNIT/ML Inject 30 Units into the skin at bedtime.   Insulin Pen Needle (B-D UF III MINI PEN NEEDLES) 31G X 5 MM MISC USE AS DIRECTED DAILY   Lancets (ACCU-CHEK SOFT TOUCH) lancets Use as instructed   losartan (COZAAR) 50 MG tablet TAKE ONE TABLET BY MOUTH ONCE DAILY   magnesium 30 MG tablet Take 30 mg by mouth once a week.   meloxicam (MOBIC) 15 MG tablet TK 1 T PO QD AS NEEDED WITH FOOD   niacin (NIASPAN) 500 MG CR tablet TAKE ONE TABLET BY MOUTH EVERYDAY AT BEDTIME   potassium chloride (KLOR-CON 10) 10 MEQ tablet Take 1 tablet (10 mEq total) by mouth daily.   sitaGLIPtin (JANUVIA) 100 MG tablet Take 1 tablet (100 mg total) by mouth daily.   [DISCONTINUED] oxyCODONE-acetaminophen (PERCOCET) 5-325 MG tablet Take 1 tablet by mouth every 6 (six) hours as needed.   No facility-administered encounter medications on file as of 06/24/2020.    Patient Active Problem List   Diagnosis Date Noted   Cognitive deficits 11/07/2018   ETOH abuse 07/30/2013   Anxiety 07/30/2013   Essential hypertension 01/31/2011    Conditions to be addressed/monitored: DM II, HTN, Mixed hyperlipidemia  Care Plan : Acute Pain (Adult)  Updates made by Gabriella Churches, RN since 06/26/2020 12:00 AM     Problem: Pain Management (  Acute Pain)   Priority: High     Goal: Acute Pain Management - treatment optimized   Start Date: 06/24/2020  Expected End Date: 09/24/2020  This Visit's Progress: On track  Priority: High  Note:   D Current Barriers:  Ineffective Self Health Maintenance  Clinical Goal(s):  Collaboration with Gabriella Felts, FNP regarding development and update of comprehensive plan of care as evidenced by provider attestation and co-signature Inter-disciplinary care  team collaboration (see longitudinal plan of care) patient will work with care management team to address care coordination and chronic disease management needs related to Disease Management Educational Needs Care Coordination Medication Management and Education Psychosocial Support   Interventions:  06/24/20 inbound call from patient/Care Coordination  Evaluation of current treatment plan related to  DM II, HTN, Mixed hyperlipidemia , self-management and patient's adherence to plan as established by provider. Collaboration with Gabriella Felts, FNP regarding development and update of comprehensive plan of care as evidenced by provider attestation       and co-signature Inter-disciplinary care team collaboration (see longitudinal plan of care) Received inbound call from patient stating she has been unable to reach the PCP office in order to fill her pain med Rx, she is requesting assistance Collaborated with PCP Gabriella Felts FNP regarding patient's request, Gabriella Acosta stated the office has been in contact with Gabriella Acosta and sent in a refill for her pain medication  Discussed plans with patient for ongoing care management follow up and provided patient with direct contact information for care management team Self Care Activities:  Self administers medications as prescribed Attends all scheduled provider appointments Calls pharmacy for medication refills Calls provider office for new concerns or questions Patient Goals: - follow MD recommendations for management of acute pain  Follow Up Plan: Telephone follow up appointment with care management team member scheduled for: 07/07/20    Plan:Telephone follow up appointment with care management team member scheduled for:  08/06/20  Gabriella Sale, RN, BSN, CCM Care Management Coordinator [facility] Care Management/[facility]  Direct Phone: [phone]

## 2020-07-01 DIAGNOSIS — D171 Benign lipomatous neoplasm of skin and subcutaneous tissue of trunk: Secondary | ICD-10-CM | POA: Diagnosis not present

## 2020-07-05 ENCOUNTER — Telehealth: Payer: Self-pay | Admitting: *Deleted

## 2020-07-05 NOTE — Chronic Care Management (AMB) (Signed)
  Chronic Care Management   Note  07/05/2020 Name: Zakara Parkey MRN: [id number] DOB: 18-Nov-1945  Xyla Leisner Woolen is a 75 y.o. year old female who is a primary care patient of [name], FNP. Taj Arteaga is currently enrolled in care management services. An additional referral for PharmD was placed.   Follow up plan: Telephone appointment with care management team member scheduled for:07/15/2020  [facility] Guide, Embedded Care Coordination [facility] Management

## 2020-07-07 ENCOUNTER — Telehealth: Payer: Medicare PPO

## 2020-07-12 DIAGNOSIS — E1165 Type 2 diabetes mellitus with hyperglycemia: Secondary | ICD-10-CM | POA: Diagnosis not present

## 2020-07-14 ENCOUNTER — Telehealth: Payer: Self-pay

## 2020-07-14 NOTE — Chronic Care Management (AMB) (Signed)
   Patient aware of telephone appointment with [name] CPP at 3:45.Patient aware to have/bring all medications, supplements, blood pressure and/or blood sugar logs to visit.  Questions: Have you had any recent office visit or specialist visit outside of [facility] systems? Dr. Elnoria Howard for gallstone.  Are there any concerns you would like to discuss during your office visit? Patient stated no  Are you having any problems obtaining your medications? (Whether it pharmacy issues or cost) Patient stated no   Star Rating Drug: Atorvastatin 80 MG- Last filled 05-18-2020 [facility] Telmisartan 20 mg- Last filled 06-09-2020 90 DS [facility] Januvia 100 mg- Last filled 06-14-2020 [facility]  Any gaps in medications fill history? No   Huey Romans [facility] Clinical Pharmacist Assistant [phone]

## 2020-07-15 ENCOUNTER — Ambulatory Visit (INDEPENDENT_AMBULATORY_CARE_PROVIDER_SITE_OTHER): Payer: Medicare PPO

## 2020-07-15 DIAGNOSIS — I10 Essential (primary) hypertension: Secondary | ICD-10-CM | POA: Diagnosis not present

## 2020-07-15 DIAGNOSIS — Z720 Tobacco use: Secondary | ICD-10-CM

## 2020-07-15 DIAGNOSIS — E782 Mixed hyperlipidemia: Secondary | ICD-10-CM | POA: Diagnosis not present

## 2020-07-15 DIAGNOSIS — E1165 Type 2 diabetes mellitus with hyperglycemia: Secondary | ICD-10-CM | POA: Diagnosis not present

## 2020-07-15 NOTE — Progress Notes (Signed)
Chronic Care Management Pharmacy Note  07/22/2020 Name:  Gabriella Acosta MRN:  [id number] DOB:  1945-04-30  Summary: Patient reports being in pain from gallstones located in her bowel.   Recommendations/Changes made from today's visit: Recommend patient follow up with PCP team about questions about her health. Recommend patient receive COVID-19  booster vaccine. Recommend patient decide a quit date.   Plan: Contact PCP team and let them know that Ms. [name] would like them to contact her. Scheduled patient for COVID-19 vaccine booster on 07/16/2020 Patient decided on quit date.    Subjective: Gabriella Acosta is an 75 y.o. year old female who is a primary patient of [name], [place].  The CCM team was consulted for assistance with disease management and care coordination needs.  Patient reports that she has had a lot of things going on. She is scheduled for surgery in July and August. She is feeling a bit overwhelmed but she is trying to make it through. She is concerned because she has lost an additional 15 pounds since last month.   Engaged with patient by telephone for follow up visit in response to provider referral for pharmacy case management and/or care coordination services.   Consent to Services:  The patient was given information about Chronic Care Management services, agreed to services, and gave verbal consent prior to initiation of services.  Please see initial visit note for detailed documentation.   Patient Care Team: [name], FNP as PCP - General (General Practice) Daneen Schick as Social Worker Mayford Knife, [facility] (Pharmacist)  Recent office visits: 05/05/2020 PCP OV  Recent consult visits: 05/10/2020 Cardiology [facility] visits: None in previous 6 months  ED visits: - Chest wall pain   Objective:  Lab Results  Component Value Date   CREATININE 0.86 05/26/2020   BUN 17 05/26/2020   GFRNONAA >60 05/26/2020   GFRAA 102 10/20/2019    NA 133 (L) 05/26/2020   K 4.9 05/26/2020   CALCIUM 9.8 05/26/2020   CO2 26 05/26/2020   GLUCOSE 352 (H) 05/26/2020    Lab Results  Component Value Date/Time   HGBA1C 14.1 (H) 03/25/2020 12:13 PM   HGBA1C 14.0 (H) 10/20/2019 10:22 AM   MICROALBUR 30 12/10/2018 05:09 PM   MICROALBUR 150 10/31/2017 12:47 PM    Last diabetic Eye exam:  Lab Results  Component Value Date/Time   HMDIABEYEEXA No Retinopathy 09/02/2019 12:00 AM    Last diabetic Foot exam: No results found for: HMDIABFOOTEX   Lab Results  Component Value Date   CHOL 185 03/25/2020   HDL 40 03/25/2020   LDLCALC 91 03/25/2020   TRIG 324 (H) 03/25/2020   CHOLHDL 4.6 (H) 03/25/2020    Hepatic Function Latest Ref Rng & Units 05/26/2020 03/25/2020 10/20/2019  Total Protein 6.5 - 8.1 g/dL 7.8 7.4 7.7  Albumin 3.5 - 5.0 g/dL 4.0 4.6 4.5  AST 15 - 41 U/L _0 ALT 0 - 44 U/L 21 39(H) 21  Alk Phosphatase 38 - 126 U/L 122 161(H) 185(H)  Total Bilirubin 0.3 - 1.2 mg/dL 0.9 0.3 0.3    Lab Results  Component Value Date/Time   TSH 1.650 10/20/2019 10:22 AM    CBC Latest Ref Rng & Units 05/26/2020 10/20/2019 07/30/2019  WBC 4.0 - 10.5 K/uL 10.6(H) 12.8(H) 9.9  Hemoglobin 12.0 - 15.0 g/dL 13.4 13.5 12.6  Hematocrit 36.0 - 46.0 % 40.5 42.2 37.7  Platelets 150 - 400 K/uL 120(L) 132(L) 92(LL)  No results found for: VD25OH  Clinical ASCVD: No  The 10-year ASCVD risk score Mikey Bussing DC Jr., et al., 2013) is: 53.2%   Values used to calculate the score:     Age: 75 years     Sex: Female     Is Non-Hispanic African American: No     Diabetic: Yes     Tobacco smoker: Yes     Systolic Blood Pressure: 916 mmHg     Is BP treated: Yes     HDL Cholesterol: 40 mg/dL     Total Cholesterol: 185 mg/dL    Depression screen [facility] 2/9 07/30/2019 02/04/2019 12/10/2018  Decreased Interest 0 0 0  Down, Depressed, Hopeless 0 0 0  PHQ - 2 Score 0 0 0  Altered sleeping - - -  Tired, decreased energy - - -  Change in appetite - - -   Feeling bad or failure about yourself  - - -  Trouble concentrating - - -  Moving slowly or fidgety/restless - - -  Suicidal thoughts - - -  PHQ-9 Score - - -  Difficult doing work/chores - - -      Social History   Tobacco Use  Smoking Status Every Day   Packs/day: 0.25   Types: Cigarettes  Smokeless Tobacco Current   BP Readings from Last 3 Encounters:  05/26/20 (!) 148/74  05/10/20 124/61  05/05/20 (!) 142/80   Pulse Readings from Last 3 Encounters:  05/26/20 62  05/10/20 74  05/05/20 74   Wt Readings from Last 3 Encounters:  05/10/20 123 lb 9.6 oz (56.1 kg)  05/05/20 124 lb (56.2 kg)  03/25/20 124 lb 9.6 oz (56.5 kg)   BMI Readings from Last 3 Encounters:  05/10/20 21.22 kg/m  05/05/20 21.55 kg/m  03/25/20 21.52 kg/m    Assessment/Interventions: Review of patient past medical history, allergies, medications, health status, including review of consultants reports, laboratory and other test data, was performed as part of comprehensive evaluation and provision of chronic care management services.   SDOH:  (Social Determinants of Health) assessments and interventions performed: No  SDOH Screenings   Alcohol Screen: Not on file  Depression (PHQ2-9): Low Risk    PHQ-2 Score: 0  Financial Resource Strain: High Risk   Difficulty of Paying Living Expenses: Hard  Food Insecurity: No Food Insecurity   Worried About Charity fundraiser in the Last Year: Never true   Ran Out of Food in the Last Year: Never true  Housing: Not on file  Physical Activity: Inactive   Days of Exercise per Week: 0 days   Minutes of Exercise per Session: 0 min  Social Connections: Not on file  Stress: No Stress Concern Present   Feeling of Stress : Only a little  Tobacco Use: High Risk   Smoking Tobacco Use: Every Day   Smokeless Tobacco Use: Current  Transportation Needs: No Transportation Needs   Lack of Transportation (Medical): No   Lack of Transportation (Non-Medical): No     CCM Care Plan  No Known Allergies  Medications Reviewed Today     Reviewed by Blenda Peals, RN (Registered Nurse) on 07/20/20 at [phone]  Med List Status: Complete   Medication Order Taking? Sig Documenting Provider Last Dose Status Informant  amLODipine (NORVASC) 5 MG tablet [id number] Yes Take one tablet by mouth daily  Patient taking differently: Take 5 mg by mouth daily.   [name], FNP  Active Self  atorvastatin (LIPITOR) 80 MG tablet [id number]  No Take 1 tablet (80 mg total) by mouth daily.  Patient not taking: No sig reported   [name], FNP Not Taking Consider Medication Status and Discontinue (Patient Preference) Self  Blood Glucose Monitoring Suppl DEVI [id number]  Check blood sugars twice daily. E11.9 [name], FNP  Active Self  Continuous Blood Gluc Sensor (FREESTYLE LIBRE 2 SENSOR) [place] [id number]  Use to check blood sugars daily [name], FNP  Active Self  diclofenac sodium (VOLTAREN) 1 % GEL [id number]  Apply 2 g topically 4 (four) times daily. [name], FNP  Consider Medication Status and Discontinue (Patient Preference) Self  gabapentin (NEURONTIN) 100 MG capsule [id number] No Take 1 capsule (100 mg total) by mouth 3 (three) times daily.  Patient not taking: No sig reported   [name], FNP Not Taking Consider Medication Status and Discontinue (Patient Preference) Self  Glucagon (GVOKE HYPOPEN 2-PACK) 0.5 MG/0.1ML SOAJ [id number] No Inject 0.5 mg into the skin as needed.  Patient not taking: No sig reported   [name], FNP Not Taking Consider Medication Status and Discontinue (Patient Preference) Self  glucose blood (ACCU-CHEK GUIDE) test strip [id number]  Use as instructed [name], FNP  Active Self  icosapent Ethyl (VASCEPA) 1 g capsule [id number] Yes Take 2 capsules (2 g total) by mouth 2 (two) times daily. Pixie Casino, MD  Active Self  insulin aspart (FIASP FLEXTOUCH) 100 UNIT/ML FlexTouch Pen [id number] Yes Inject 6-10 Units  into the skin daily as needed (if  blood glucose is in 200 or above). Sliding scale  Use as directed per sliding scale three times daily [provider]  Active Self  Insulin Glargine (BASAGLAR KWIKPEN) 100 UNIT/ML [id number] Yes Inject 30 Units into the skin at bedtime. [provider]  Active Self           Med Note Suszanne Finch May 10, 2020  1:48 PM)    Insulin Pen Needle (B-D UF III MINI PEN NEEDLES) 31G X 5 MM MISC [id number]  USE AS DIRECTED DAILY [name], FNP  Active Self  Lancets (ACCU-CHEK SOFT TOUCH) lancets [id number]  Use as instructed [name], FNP  Active Self  losartan (COZAAR) 50 MG tablet [id number] Yes TAKE ONE TABLET BY MOUTH ONCE DAILY  Patient taking differently: Take 50 mg by mouth daily.   [name], FNP  Active Self  magnesium 30 MG tablet [id number] Yes Take 30 mg by mouth once a week. [provider]  Active Self  Multiple Vitamins-Minerals (MULTIVITAMIN WITH MINERALS) tablet [id number] Yes Take 1 tablet by mouth daily. [provider]  Active Self  niacin (NIASPAN) 500 MG CR tablet [id number] Yes TAKE ONE TABLET BY MOUTH EVERYDAY AT BEDTIME  Patient taking differently: Take 500 mg by mouth once a week.   [name], FNP  Active Self  potassium chloride (KLOR-CON 10) 10 MEQ tablet [id number] No Take 1 tablet (10 mEq total) by mouth daily.  Patient not taking: No sig reported   [name], FNP Not Taking Consider Medication Status and Discontinue (Patient Preference) Self  sitaGLIPtin (JANUVIA) 100 MG tablet [id number] Yes Take 1 tablet (100 mg total) by mouth daily. [name], FNP  Active Self            Patient Active Problem List   Diagnosis Date Noted   Cognitive deficits 11/07/2018   ETOH abuse 07/30/2013   Anxiety 07/30/2013   Essential hypertension 01/31/2011    Immunization History  Administered Date(s) Administered  Fluad Quad(high Dose 65+) 10/20/2019   Alphonsa Overall (J&J) SARS-COV-2  Vaccination 03/17/2019   Tdap 01/25/2013, 07/29/2013    Conditions to be addressed/monitored:  Diabetes and Tobacco use  Care Plan : Anon Raices  Updates made by Mayford Knife, [place] since 07/22/2020 12:00 AM     Problem: HTN, HLD, DM   Priority: High     Long-Range Goal: Disease Management   Recent Progress: Not on track  Priority: High  Note:    Current Barriers:  Unable to independently monitor therapeutic efficacy Does not maintain contact with provider office Does not contact provider office for questions/concerns  Pharmacist Clinical Goal(s):  Patient will achieve adherence to monitoring guidelines and medication adherence to achieve therapeutic efficacy through collaboration with PharmD and provider.   Interventions: 1:1 collaboration with [name], FNP regarding development and update of comprehensive plan of care as evidenced by provider attestation and co-signature Inter-disciplinary care team collaboration (see longitudinal plan of care) Comprehensive medication review performed; medication list updated in electronic medical record   Diabetes (A1c goal <7%) -Uncontrolled -Current medications: Insulin Aspart - sliding scale  300 and above inject 14 units  Patient reports having one pen of FIASP left, she does not use all the time  Patient has not tried to fill medication at [facility]  Usually BS between 265-300  Insulin Glargine- inject 30 units at bedtime   Januvia 100 mg tablet once per day -Current home glucose readings fasting glucose: 146 - 165  post prandial glucose: 200  -Denies hypoglycemic/hyperglycemic symptoms -Current meal patterns:  breakfast: bowl of cereal, cup of coffee   lunch: Beef patty and a salad  dinner: she does not always eat dinner, sometimes chicken and vegetables  snacks: she does not eat any snacks  drinks: water mostly, tea, protein shakes in between meals and through out the day  -Current exercise: none at  this time  -Patient reports that she has lost 15 pounds in a month.  -Educated on Complications of diabetes including kidney damage, retinal damage, and cardiovascular disease; Prevention and management of hypoglycemic episodes; Benefits of routine self-monitoring of blood sugar; -Counseled to check feet daily and get yearly eye exams -Recommended to continue current medication   Tobacco use (Goal to quit smoking) -Uncontrolled -Previous quit attempts: 1X -Current treatment : not interested in quitting at this time.  -Patient smokes After 30 minutes of waking -Patient triggers include: stress, anxiety, and pain -On a scale of 1-10, reports MOTIVATION to quit is 7 -On a scale of 1-10, reports CONFIDENCE in quitting is 10 -She has quit before without any aid because she wanted to  -Patient set quit date of 09/18/2020. Provided contact information for [place] [facility]uit [facility] (1-800-QUIT-NOW) and encouraged patient to reach out to this group for support. -Educated on smoking cessation.  Collaborated with patient to have a quit date.   Health Maintenance -Vaccine gaps:    -COVID-19 Booster   -Pneumonia Vaccine  Recommended patient receive her vaccinations to help prevent severe illness.  Collaborated with patient and scheduled COVID-19 booster dose for 07/16/2020 @ 10:45 am at [facility].        -Confirmed patient received COVID-19 vaccine 2 nd booster on 07/16/2020 through [facility] registry.  Patient Goals/Self-Care Activities Patient will:  - take medications as prescribed  Follow Up Plan: The patient has been provided with contact information for the care management team and has been advised to call with any health related questions or concerns.  Medication Assistance: None required.  Patient affirms current coverage meets needs.  Compliance/Adherence/Medication fill history: Care Gaps: Shingrix Vaccine  Mammogram-scheduled for 09/21/2020 DEXA Scan 09/21/2020 PNA  Vaccine COVID-19 2nd Booster  Star-Rating Drugs: Atorvastatin 80 mg tablet Losartan 50 mg tablet Januvia 100 mg tablet   Patient's preferred pharmacy is:  [id number] [address], [address] AT [address] [place] [address] Phone: [phone] Fax: [phone]  Uses pill box? Yes Pt endorses 100% compliance  We discussed: Benefits of medication synchronization, packaging and delivery as well as enhanced pharmacist oversight with [facility]. Patient decided to: Continue current medication management strategy  Care Plan and Follow Up Patient Decision:  Patient agrees to Care Plan and Follow-up.  Plan: The patient has been provided with contact information for the care management team and has been advised to call with any health related questions or concerns.   [name], PharmD Clinical Pharmacist [facility] [phone]

## 2020-07-20 ENCOUNTER — Encounter (HOSPITAL_COMMUNITY): Payer: Self-pay | Admitting: Gastroenterology

## 2020-07-20 ENCOUNTER — Ambulatory Visit: Payer: Medicare PPO

## 2020-07-20 ENCOUNTER — Other Ambulatory Visit: Payer: Self-pay

## 2020-07-20 ENCOUNTER — Other Ambulatory Visit: Payer: Self-pay | Admitting: Nurse Practitioner

## 2020-07-20 DIAGNOSIS — I10 Essential (primary) hypertension: Secondary | ICD-10-CM

## 2020-07-20 DIAGNOSIS — R1010 Upper abdominal pain, unspecified: Secondary | ICD-10-CM

## 2020-07-20 DIAGNOSIS — E1165 Type 2 diabetes mellitus with hyperglycemia: Secondary | ICD-10-CM

## 2020-07-20 DIAGNOSIS — E782 Mixed hyperlipidemia: Secondary | ICD-10-CM

## 2020-07-20 MED ORDER — OXYCODONE-ACETAMINOPHEN 5-325 MG PO TABS: 1.0000 | ORAL_TABLET | Freq: Four times a day (QID) | ORAL | 0 refills | Status: DC | PRN

## 2020-07-20 NOTE — Chronic Care Management (AMB) (Signed)
Chronic Care Management    Social Work Note  07/20/2020 Name: Gabriella Acosta MRN: [id number] DOB: August 24, 1945  Gabriella Acosta is a 75 y.o. year old female who is a primary care patient of [name], [place]. The CCM team was consulted to assist the patient with chronic disease management and/or care coordination needs related to:  DM II, HTN, and Mixed Hyperlipidemia .   Engaged with patient by telephone for follow up visit in response to provider referral for social work chronic care management and care coordination services.   Consent to Services:  The patient was given information about Chronic Care Management services, agreed to services, and gave verbal consent prior to initiation of services.  Please see initial visit note for detailed documentation.   Patient agreed to services and consent obtained.   Assessment: Review of patient past medical history, allergies, medications, and health status, including review of relevant consultants reports was performed today as part of a comprehensive evaluation and provision of chronic care management and care coordination services.     SDOH (Social Determinants of Health) assessments and interventions performed:    Advanced Directives Status: Not addressed in this encounter.  CCM Care Plan  No Known Allergies  Outpatient Encounter Medications as of 07/20/2020  Medication Sig   amLODipine (NORVASC) 5 MG tablet Take one tablet by mouth daily (Patient taking differently: Take 5 mg by mouth daily.)   atorvastatin (LIPITOR) 80 MG tablet Take 1 tablet (80 mg total) by mouth daily. (Patient not taking: No sig reported)   Blood Glucose Monitoring Suppl DEVI Check blood sugars twice daily. E11.9   Continuous Blood Gluc Sensor (FREESTYLE LIBRE 2 SENSOR) MISC Use to check blood sugars daily   diclofenac sodium (VOLTAREN) 1 % GEL Apply 2 g topically 4 (four) times daily.   gabapentin (NEURONTIN) 100 MG capsule Take 1 capsule (100 mg total) by  mouth 3 (three) times daily. (Patient not taking: No sig reported)   Glucagon (GVOKE HYPOPEN 2-PACK) 0.5 MG/0.1ML SOAJ Inject 0.5 mg into the skin as needed. (Patient not taking: No sig reported)   glucose blood (ACCU-CHEK GUIDE) test strip Use as instructed   icosapent Ethyl (VASCEPA) 1 g capsule Take 2 capsules (2 g total) by mouth 2 (two) times daily.   insulin aspart (FIASP FLEXTOUCH) 100 UNIT/ML FlexTouch Pen Inject 6-10 Units into the skin daily as needed (if  blood glucose is in 200 or above). Sliding scale  Use as directed per sliding scale three times daily   Insulin Glargine (BASAGLAR KWIKPEN) 100 UNIT/ML Inject 30 Units into the skin at bedtime.   Insulin Pen Needle (B-D UF III MINI PEN NEEDLES) 31G X 5 MM MISC USE AS DIRECTED DAILY   Lancets (ACCU-CHEK SOFT TOUCH) lancets Use as instructed   losartan (COZAAR) 50 MG tablet TAKE ONE TABLET BY MOUTH ONCE DAILY (Patient taking differently: Take 50 mg by mouth daily.)   magnesium 30 MG tablet Take 30 mg by mouth once a week.   Multiple Vitamins-Minerals (MULTIVITAMIN WITH MINERALS) tablet Take 1 tablet by mouth daily.   niacin (NIASPAN) 500 MG CR tablet TAKE ONE TABLET BY MOUTH EVERYDAY AT BEDTIME (Patient taking differently: Take 500 mg by mouth once a week.)   potassium chloride (KLOR-CON 10) 10 MEQ tablet Take 1 tablet (10 mEq total) by mouth daily. (Patient not taking: No sig reported)   sitaGLIPtin (JANUVIA) 100 MG tablet Take 1 tablet (100 mg total) by mouth daily.   No facility-administered encounter medications  on file as of 07/20/2020.    Patient Active Problem List   Diagnosis Date Noted   Cognitive deficits 11/07/2018   ETOH abuse 07/30/2013   Anxiety 07/30/2013   Essential hypertension 01/31/2011    Conditions to be addressed/monitored: HTN, DMII, and Mixed Hyperlipidemia  Care Plan : Social Work [facility] care plan  Updates made by Daneen Schick since 07/20/2020 12:00 AM     Problem: Barriers to Treatment       Long-Range Goal: Barriers to Treatment Identified and Managed   Start Date: 01/29/2020  Expected End Date: 05/28/2020  Recent Progress: On track  Priority: High  Note:   Current Barriers:  Financial constraints related to cost of medical care Limited social support Chronic conditions including DM II, HTN, and Mixed Hyperlipidemia which put patient at increased risk of hospitalization  Social Work Clinical Goal(s):  Over the next 30 days patient will complete Medicaid application  Goal not met New 3.9.22 Over the next 45 days the patient will work with SW to obtain a Medicaid application 0.0.86 Goal on hold due to other patient priorities patient will follow up with her primary care provider to develop a treatment plan to address lump on side Patient will work with SW to address care coordination and care management needs related to DM II, HTN, and Mixed Hyperlipidemia  Interventions: 1:1 collaboration with [name], [place] regarding development and update of comprehensive plan of care as evidenced by provider attestation and co-signature Inter-disciplinary care team collaboration (see longitudinal plan of care) Successful outbound call placed to the patient to assess goal progression Patient indicates she is to have a procedure this Friday to determine if the gallstone can be accessed - if it is determined it cannot be accessed the patient will be referred to [facility]  Patient has scheduled procedure on August 16 to remove tumor on her side Patient reports her daughter will assist with transportation to both procedures Discussed patient continues to experience a lot of pain  Determined the patient was unable to get medication refilled by provider following her for above conditions due to him being out of the office Advised the patient SW would communicate with her primary care provider to determine if she is able to fill medications Collaboration with [name] FNP who indicates she will  send a prescription for pain medication to patients pharmacy Successful outbound call to advise patient a prescription has been sent to [facility] on [facility] and [place] for SW to follow up with the patient on August 23  Patient Goals/Self-Care Activities patient will:   -  Attend upcomming scheduled appointments -Take medication as prescribed Contact SW as needed prior to next scheduled call  Follow up Plan: SW will follow up with patient by phone over the next 45 days       Follow Up Plan: Appointment scheduled for SW follow up with client by phone on: 8.23.22      Joci Dress, [place], CDP Social Worker, Certified Dementia Practitioner [place] [facility] [place] [facility] [phone]

## 2020-07-20 NOTE — Patient Instructions (Signed)
Social Worker Visit Information  Goals we discussed today:   Goals Addressed             This Visit's Progress    Barriers to Treatment Identified and Managed   On track    Timeframe:  Long-Range Goal Priority:  Low Start Date: 1.20.22                            Expected End Date: 5.20.22                      Next planned outreach: 8.23.22  Patient Goals/Self-Care Activities patient will:   - Attend upcomming scheduled appointments -Take medication as prescribed Contact SW as needed prior to next scheduled call          Follow Up Plan:  SW will follow up with the patient after her procedure in August.   Bevelyn Ngo, BSW, CDP Social Worker, Certified Dementia Practitioner [facility] Care Management [phone]

## 2020-07-22 ENCOUNTER — Encounter: Payer: Self-pay | Admitting: Nurse Practitioner

## 2020-07-22 DIAGNOSIS — E119 Type 2 diabetes mellitus without complications: Secondary | ICD-10-CM | POA: Insufficient documentation

## 2020-07-22 DIAGNOSIS — Z720 Tobacco use: Secondary | ICD-10-CM | POA: Insufficient documentation

## 2020-07-22 HISTORY — DX: Type 2 diabetes mellitus without complications: E11.9

## 2020-07-22 NOTE — Patient Instructions (Signed)
Visit Information It was great speaking with you today!  Please let me know if you have any questions about our visit.   Goals Addressed             This Visit's Progress    Manage My Medicine       Timeframe:  Long-Range Goal Priority:  High Start Date: 05/05/2020                            Expected End Date: 05/05/2021                      Follow Up Date 09/02/2020   - call for medicine refill 2 or 3 days before it runs out - keep a list of all the medicines I take; vitamins and herbals too - use a pillbox to sort medicine - use an alarm clock or phone to remind me to take my medicine    Why is this important?   These steps will help you keep on track with your medicines.        Set My Target A1C-Diabetes Type 2       Timeframe:  Long-Range Goal Priority:  High Start Date:                             Expected End Date:                       Follow Up Date 09/02/2020    - set target A1C    Why is this important?   Your target A1C is decided together by you and your doctor.  It is based on several things like your age and other health issues.    Notes:  Please follow up with any questions that you might have        Patient Care Plan: CCM Pharmacy Care Plan     Problem Identified: HTN, HLD, DM   Priority: High     Long-Range Goal: Disease Management   Recent Progress: Not on track  Priority: High  Note:    Current Barriers:  Unable to independently monitor therapeutic efficacy Does not maintain contact with provider office Does not contact provider office for questions/concerns  Pharmacist Clinical Goal(s):  Patient will achieve adherence to monitoring guidelines and medication adherence to achieve therapeutic efficacy through collaboration with PharmD and provider.   Interventions: 1:1 collaboration with [name], FNP regarding development and update of comprehensive plan of care as evidenced by provider attestation and  co-signature Inter-disciplinary care team collaboration (see longitudinal plan of care) Comprehensive medication review performed; medication list updated in electronic medical record   Diabetes (A1c goal <7%) -Uncontrolled -Current medications: Insulin Aspart - sliding scale  300 and above inject 14 units  Patient reports having one pen of FIASP left, she does not use all the time  Patient has not tried to fill medication at [facility]  Usually BS between 265-300  Insulin Glargine- inject 30 units at bedtime   Januvia 100 mg tablet once per day -Current home glucose readings fasting glucose: 146 - 165  post prandial glucose: 200  -Denies hypoglycemic/hyperglycemic symptoms -Current meal patterns:  breakfast: bowl of cereal, cup of coffee   lunch: Beef patty and a salad  dinner: she does not always eat dinner, sometimes chicken and vegetables  snacks: she does not eat any snacks  drinks: water mostly, tea, protein shakes in between meals and through out the day  -Current exercise: none at this time  -Patient reports that she has lost 15 pounds in a month.  -Educated on Complications of diabetes including kidney damage, retinal damage, and cardiovascular disease; Prevention and management of hypoglycemic episodes; Benefits of routine self-monitoring of blood sugar; -Counseled to check feet daily and get yearly eye exams -Recommended to continue current medication   Tobacco use (Goal to quit smoking) -Uncontrolled -Previous quit attempts: 1X -Current treatment : not interested in quitting at this time.  -Patient smokes After 30 minutes of waking -Patient triggers include: stress, anxiety, and pain -On a scale of 1-10, reports MOTIVATION to quit is 7 -On a scale of 1-10, reports CONFIDENCE in quitting is 10 -She has quit before without any aid because she wanted to  -Patient set quit date of 09/18/2020. Provided contact information for [place] Quit Line (1-800-QUIT-NOW) and encouraged  patient to reach out to this group for support. -Educated on smoking cessation.  Collaborated with patient to have a quit date.   Health Maintenance -Vaccine gaps:    -COVID-19 Booster   -Pneumonia Vaccine  Recommended patient receive her vaccinations to help prevent severe illness.  Collaborated with patient and scheduled COVID-19 booster dose for 07/16/2020 @ 10:45 am at [facility].        -Confirmed patient received COVID-19 vaccine 2 nd booster on 07/16/2020 through [facility] registry.  Patient Goals/Self-Care Activities Patient will:  - take medications as prescribed  Follow Up Plan: The patient has been provided with contact information for the care management team and has been advised to call with any health related questions or concerns.        Patient agreed to services and verbal consent obtained.   The patient verbalized understanding of instructions, educational materials, and care plan provided today and agreed to receive a mailed copy of patient instructions, educational materials, and care plan.   [name], PharmD Clinical Pharmacist [facility] [phone]

## 2020-07-22 NOTE — Anesthesia Preprocedure Evaluation (Addendum)
Anesthesia Evaluation  Patient identified by MRN, date of birth, ID band Patient awake    Reviewed: Allergy & Precautions, NPO status , Patient's Chart, lab work & pertinent test results  Airway Mallampati: II  TM Distance: >3 FB Neck ROM: Full    Dental no notable dental hx. (+) Teeth Intact, Dental Advisory Given   Pulmonary Current Smoker,    Pulmonary exam normal breath sounds clear to auscultation       Cardiovascular hypertension, Pt. on medications Normal cardiovascular exam Rhythm:Regular Rate:Normal     Neuro/Psych    GI/Hepatic   Endo/Other  diabetes, Type 1  Renal/GU      Musculoskeletal   Abdominal   Peds  Hematology   Anesthesia Other Findings   Reproductive/Obstetrics                           Anesthesia Physical Anesthesia Plan  ASA: 3  Anesthesia Plan: MAC   Post-op Pain Management:    Induction: Intravenous  PONV Risk Score and Plan: Treatment may vary due to age or medical condition  Airway Management Planned: Natural Airway  Additional Equipment:   Intra-op Plan:   Post-operative Plan:   Informed Consent: I have reviewed the patients History and Physical, chart, labs and discussed the procedure including the risks, benefits and alternatives for the proposed anesthesia with the patient or authorized representative who has indicated his/her understanding and acceptance.     Dental advisory given  Plan Discussed with: CRNA  Anesthesia Plan Comments: (Pancreatitis for EGD)       Anesthesia Quick Evaluation

## 2020-07-23 ENCOUNTER — Ambulatory Visit (HOSPITAL_COMMUNITY): Payer: Medicare PPO | Admitting: Anesthesiology

## 2020-07-23 ENCOUNTER — Ambulatory Visit (HOSPITAL_COMMUNITY)
Admission: RE | Admit: 2020-07-23 | Discharge: 2020-07-23 | Disposition: A | Discharge status: Home / Self Care | Payer: Medicare PPO | Attending: Gastroenterology | Admitting: Gastroenterology

## 2020-07-23 ENCOUNTER — Encounter (HOSPITAL_COMMUNITY): Payer: Self-pay | Admitting: Gastroenterology

## 2020-07-23 ENCOUNTER — Encounter (HOSPITAL_COMMUNITY)
Admission: RE | Disposition: A | Discharge status: Home / Self Care | Payer: Self-pay | Source: Home / Self Care | Attending: Gastroenterology

## 2020-07-23 ENCOUNTER — Other Ambulatory Visit: Payer: Self-pay

## 2020-07-23 DIAGNOSIS — Z79899 Other long term (current) drug therapy: Secondary | ICD-10-CM | POA: Diagnosis not present

## 2020-07-23 DIAGNOSIS — F17210 Nicotine dependence, cigarettes, uncomplicated: Secondary | ICD-10-CM | POA: Insufficient documentation

## 2020-07-23 DIAGNOSIS — R933 Abnormal findings on diagnostic imaging of other parts of digestive tract: Secondary | ICD-10-CM | POA: Diagnosis not present

## 2020-07-23 DIAGNOSIS — F419 Anxiety disorder, unspecified: Secondary | ICD-10-CM | POA: Diagnosis not present

## 2020-07-23 DIAGNOSIS — K8689 Other specified diseases of pancreas: Secondary | ICD-10-CM | POA: Insufficient documentation

## 2020-07-23 DIAGNOSIS — E782 Mixed hyperlipidemia: Secondary | ICD-10-CM

## 2020-07-23 DIAGNOSIS — E119 Type 2 diabetes mellitus without complications: Secondary | ICD-10-CM | POA: Diagnosis not present

## 2020-07-23 DIAGNOSIS — K859 Acute pancreatitis without necrosis or infection, unspecified: Secondary | ICD-10-CM | POA: Diagnosis not present

## 2020-07-23 DIAGNOSIS — K861 Other chronic pancreatitis: Secondary | ICD-10-CM | POA: Insufficient documentation

## 2020-07-23 DIAGNOSIS — I10 Essential (primary) hypertension: Secondary | ICD-10-CM | POA: Diagnosis not present

## 2020-07-23 DIAGNOSIS — I700 Atherosclerosis of aorta: Secondary | ICD-10-CM | POA: Diagnosis not present

## 2020-07-23 HISTORY — PX: ESOPHAGOGASTRODUODENOSCOPY (EGD) WITH PROPOFOL: SHX5813

## 2020-07-23 HISTORY — DX: Type 2 diabetes mellitus without complications: E11.9

## 2020-07-23 HISTORY — PX: UPPER ESOPHAGEAL ENDOSCOPIC ULTRASOUND (EUS): SHX6562

## 2020-07-23 LAB — GLUCOSE, CAPILLARY: Glucose-Capillary: 354 mg/dL — ABNORMAL HIGH (ref 70–99)

## 2020-07-23 SURGERY — ESOPHAGOGASTRODUODENOSCOPY (EGD) WITH PROPOFOL
Anesthesia: Monitor Anesthesia Care

## 2020-07-23 MED ORDER — PROPOFOL 500 MG/50ML IV EMUL
INTRAVENOUS | Status: DC | PRN
  Administered 2020-07-23: 100 ug/kg/min via INTRAVENOUS

## 2020-07-23 MED ORDER — SODIUM CHLORIDE 0.9 % IV SOLN: INTRAVENOUS | Status: DC

## 2020-07-23 MED ORDER — LACTATED RINGERS IV SOLN
INTRAVENOUS | Status: DC
  Administered 2020-07-23: 1000 mL via INTRAVENOUS

## 2020-07-23 MED ORDER — ESMOLOL HCL 100 MG/10ML IV SOLN
INTRAVENOUS | Status: DC | PRN
  Administered 2020-07-23: 30 mg via INTRAVENOUS

## 2020-07-23 SURGICAL SUPPLY — 15 items

## 2020-07-23 NOTE — Transfer of Care (Signed)
Immediate Anesthesia Transfer of Care Note  Patient: Fortunata Betty  Procedure(s) Performed: Procedure(s): ESOPHAGOGASTRODUODENOSCOPY (EGD) WITH PROPOFOL (N/A) UPPER ESOPHAGEAL ENDOSCOPIC ULTRASOUND (EUS) (N/A)  Patient Location: PACU and Endoscopy Unit  Anesthesia Type:MAC  Level of Consciousness: awake, alert  and oriented  Airway & Oxygen Therapy: Patient Spontanous Breathing and Patient connected to nasal cannula oxygen  Post-op Assessment: Report given to RN and Post -op Vital signs reviewed and stable  Post vital signs: Reviewed and stable  Last Vitals:  Vitals:   07/23/20 1141  Pulse: 82  Resp: 10  Temp: (!) 29.5 C    Complications: No apparent anesthesia complications

## 2020-07-23 NOTE — Anesthesia Postprocedure Evaluation (Signed)
Anesthesia Post Note  Patient: Gabriella Acosta  Procedure(s) Performed: ESOPHAGOGASTRODUODENOSCOPY (EGD) WITH PROPOFOL UPPER ESOPHAGEAL ENDOSCOPIC ULTRASOUND (EUS)     Patient location during evaluation: Endoscopy Anesthesia Type: MAC Level of consciousness: awake and alert Pain management: pain level controlled Vital Signs Assessment: post-procedure vital signs reviewed and stable Respiratory status: spontaneous breathing, nonlabored ventilation, respiratory function stable and patient connected to nasal cannula oxygen Cardiovascular status: blood pressure returned to baseline and stable Postop Assessment: no apparent nausea or vomiting Anesthetic complications: no   No notable events documented.  Last Vitals:  Vitals:   07/23/20 1250 07/23/20 1300  BP: 118/66   Pulse: 78 66  Resp: 15   Temp:    SpO2: 100%     Last Pain:  Vitals:   07/23/20 1310  TempSrc:   PainSc: 7                  Barnet Glasgow

## 2020-07-23 NOTE — Op Note (Signed)
[facility] Patient Name: Gabriella Acosta Procedure Date: 07/23/2020 MRN: [id number] Attending MD: Jeani Hawking , MD Date of Birth: July 26, 1945 CSN: [id number] Age: 75 Admit Type: Outpatient Procedure:                Upper EUS Indications:              Dilated pancreatic duct on CT scan Providers:                Jeani Hawking, MD, Estella Husk RN, RN, Lawson Radar, Technician, Sherron Flemings, CRNA Referring MD:              Medicines:                Propofol per Anesthesia Complications:            No immediate complications. Estimated Blood Loss:     Estimated blood loss: none. Procedure:                Pre-Anesthesia Assessment:                           - Prior to the procedure, a History and Physical                            was performed, and patient medications and                            allergies were reviewed. The patient's tolerance of                            previous anesthesia was also reviewed. The risks                            and benefits of the procedure and the sedation                            options and risks were discussed with the patient.                            All questions were answered, and informed consent                            was obtained. Prior Anticoagulants: The patient has                            taken no previous anticoagulant or antiplatelet                            agents. ASA Grade Assessment: II - A patient with                            mild systemic disease. After reviewing the risks  and benefits, the patient was deemed in                            satisfactory condition to undergo the procedure.                           - Sedation was administered by an anesthesia                            professional. Deep sedation was attained.                           After obtaining informed consent, the endoscope was                            passed under  direct vision. Throughout the                            procedure, the patient's blood pressure, pulse, and                            oxygen saturations were monitored continuously. The                            [id number] ([id number]) Olympus Linear EUS was                            introduced through the mouth, and advanced to the                            second part of duodenum. The upper EUS was                            accomplished without difficulty. The patient                            tolerated the procedure well. Scope In: Scope Out: Findings:      ENDOSONOGRAPHIC FINDING: :      Many hyperechoic foci measuring up to five mm in diameter suggestive of       stones were found in the body of the pancreas, tail of the pancreas,       main pancreatic duct and side branches of the pancreatic duct.      There was no sign of significant endosonographic abnormality in the       common bile duct and in the gallbladder. The maximum diameter of the       ducts were 4 mm.      Endosonographic examination of the abdominal aorta suggested the       presence of moderate to severe atherosclerotic changes, which were       diffuse in extent. These atherosclerotic changes were not occlusive. The       atherosclerosis was immobile.      There was no sign of significant endosonographic abnormality in the left       lobe of the liver.      Multiple pancreatic parenchymal  calcifications were identified. The       majority of the stones were in the distal pancreatic body and the tail       of the pancreas. There was evidence of main PD dilation at 5 mm in the       tail of the pancreas and she had stones in the PD in the tail of the       pancreas. Impression:               - Findings suggestive of pancreatic stones were                            identified in the body of the pancreas, tail of the                            pancreas, main pancreatic duct and side branches of                             the pancreatic duct.                           - There was no sign of significant pathology in the                            common bile duct and in the gallbladder.                           - Atherosclerosis of the abdominal aorta was seen                            endosonographically.                           - There was no evidence of significant pathology in                            the left lobe of the liver.                           - No specimens collected. Moderate Sedation:      Not Applicable - Patient had care per Anesthesia. Recommendation:           - Patient has a contact number available for                            emergencies. The signs and symptoms of potential                            delayed complications were discussed with the                            patient. Return to normal activities tomorrow.                            Written discharge instructions were provided to the  patient.                           - Resume regular diet.                           - Trial of Creon.                           - Pain control. Procedure Code(s):        --- Professional ---                           [phone], Esophagogastroduodenoscopy, flexible,                            transoral; with endoscopic ultrasound examination                            limited to the esophagus, stomach or duodenum, and                            adjacent structures Diagnosis Code(s):        --- Professional ---                           R93.3, Abnormal findings on diagnostic imaging of                            other parts of digestive tract                           I70.0, Atherosclerosis of aorta                           K86.89, Other specified diseases of pancreas CPT copyright 2019 American Medical Association. All rights reserved. The codes documented in this report are preliminary and upon coder review may  be revised to meet current  compliance requirements. Jeani Hawking, MD Jeani Hawking, MD 07/23/2020 12:58:47 PM This report has been signed electronically. Number of Addenda: 0
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
## 2020-07-27 ENCOUNTER — Telehealth: Payer: Self-pay

## 2020-07-27 NOTE — Chronic Care Management (AMB) (Signed)
Called patient to remind her that her signature is needed for Januvia and Flasp insulin patient assistance. Patient stated she doesn't know when she will be able to come by the office to sign due to no transportation. Told patient we will mail applications to her home.  Huey Romans [facility] Clinical Pharmacist Assistant [phone]

## 2020-07-29 ENCOUNTER — Telehealth: Payer: Medicare PPO

## 2020-07-29 ENCOUNTER — Other Ambulatory Visit: Payer: Self-pay | Admitting: Nurse Practitioner

## 2020-07-29 ENCOUNTER — Ambulatory Visit: Payer: Self-pay

## 2020-07-29 DIAGNOSIS — E1165 Type 2 diabetes mellitus with hyperglycemia: Secondary | ICD-10-CM

## 2020-07-29 DIAGNOSIS — E782 Mixed hyperlipidemia: Secondary | ICD-10-CM

## 2020-07-29 DIAGNOSIS — I10 Essential (primary) hypertension: Secondary | ICD-10-CM

## 2020-07-29 DIAGNOSIS — Z720 Tobacco use: Secondary | ICD-10-CM

## 2020-07-29 MED ORDER — LOSARTAN POTASSIUM 50 MG PO TABS: 50.0000 mg | ORAL_TABLET | Freq: Every day | ORAL | 0 refills | Status: DC

## 2020-07-30 ENCOUNTER — Other Ambulatory Visit: Payer: Self-pay

## 2020-07-30 MED ORDER — LOSARTAN POTASSIUM 50 MG PO TABS: 50.0000 mg | ORAL_TABLET | Freq: Every day | ORAL | 0 refills | Status: DC

## 2020-08-03 ENCOUNTER — Telehealth: Payer: Medicare PPO

## 2020-08-04 ENCOUNTER — Ambulatory Visit: Payer: Medicare PPO | Admitting: Nurse Practitioner

## 2020-08-04 ENCOUNTER — Telehealth: Payer: Self-pay

## 2020-08-04 NOTE — Telephone Encounter (Signed)
This nurse called patient in regards to missed appointments. She states that she was not aware that she had any appointments. She says that she will call back to reschedule because she does not know when her car will be fixed.

## 2020-08-05 NOTE — Chronic Care Management (AMB) (Signed)
Chronic Care Management   CCM RN Visit Note  07/29/2020 Name: Gabriella Acosta MRN: [id number] DOB: 1945/07/19  Subjective: Gabriella Acosta is a 75 y.o. year old female who is a primary care patient of [name], FNP. The care management team was consulted for assistance with disease management and care coordination needs.    Engaged with patient by telephone for follow up visit in response to provider referral for case management and/or care coordination services.   Consent to Services:  The patient was given information about Chronic Care Management services, agreed to services, and gave verbal consent prior to initiation of services.  Please see initial visit note for detailed documentation.   Patient agreed to services and verbal consent obtained.   Assessment: Review of patient past medical history, allergies, medications, health status, including review of consultants reports, laboratory and other test data, was performed as part of comprehensive evaluation and provision of chronic care management services.   SDOH (Social Determinants of Health) assessments and interventions performed:  Yes, no acute needs   CCM Care Plan  No Known Allergies  Outpatient Encounter Medications as of 07/29/2020  Medication Sig Note   amLODipine (NORVASC) 5 MG tablet Take one tablet by mouth daily (Patient taking differently: Take 5 mg by mouth daily.)    atorvastatin (LIPITOR) 80 MG tablet Take 1 tablet (80 mg total) by mouth daily. (Patient not taking: No sig reported)    Blood Glucose Monitoring Suppl DEVI Check blood sugars twice daily. E11.9    Continuous Blood Gluc Sensor (FREESTYLE LIBRE 2 SENSOR) MISC Use to check blood sugars daily    gabapentin (NEURONTIN) 100 MG capsule Take 1 capsule (100 mg total) by mouth 3 (three) times daily. (Patient not taking: No sig reported)    Glucagon (GVOKE HYPOPEN 2-PACK) 0.5 MG/0.1ML SOAJ Inject 0.5 mg into the skin as needed. (Patient not taking:  No sig reported)    glucose blood (ACCU-CHEK GUIDE) test strip Use as instructed    icosapent Ethyl (VASCEPA) 1 g capsule Take 2 capsules (2 g total) by mouth 2 (two) times daily.    insulin aspart (FIASP FLEXTOUCH) 100 UNIT/ML FlexTouch Pen Inject 6-10 Units into the skin daily as needed (if  blood glucose is in 200 or above). Sliding scale  Use as directed per sliding scale three times daily    Insulin Glargine (BASAGLAR KWIKPEN) 100 UNIT/ML Inject 30 Units into the skin at bedtime. 07/23/2020: Took 15 units last night, 07/22/2020      Insulin Pen Needle (B-D UF III MINI PEN NEEDLES) 31G X 5 MM MISC USE AS DIRECTED DAILY    Lancets (ACCU-CHEK SOFT TOUCH) lancets Use as instructed    magnesium 30 MG tablet Take 30 mg by mouth once a week.    Multiple Vitamins-Minerals (MULTIVITAMIN WITH MINERALS) tablet Take 1 tablet by mouth daily.    niacin (NIASPAN) 500 MG CR tablet TAKE ONE TABLET BY MOUTH EVERYDAY AT BEDTIME (Patient taking differently: Take 500 mg by mouth once a week.)    oxyCODONE-acetaminophen (PERCOCET) 5-325 MG tablet Take 1 tablet by mouth every 6 (six) hours as needed.    potassium chloride (KLOR-CON 10) 10 MEQ tablet Take 1 tablet (10 mEq total) by mouth daily. (Patient not taking: No sig reported)    sitaGLIPtin (JANUVIA) 100 MG tablet Take 1 tablet (100 mg total) by mouth daily.    [DISCONTINUED] losartan (COZAAR) 50 MG tablet TAKE ONE TABLET BY MOUTH ONCE DAILY (Patient taking differently: Take 50  mg by mouth daily.)    No facility-administered encounter medications on file as of 07/29/2020.    Patient Active Problem List   Diagnosis Date Noted   Tobacco abuse 07/22/2020   Type 2 diabetes mellitus (HCC) 07/22/2020   Cognitive deficits 11/07/2018   ETOH abuse 07/30/2013   Anxiety 07/30/2013   Essential hypertension 01/31/2011    Conditions to be addressed/monitored: DM II, HTN, Mixed hyperlipidemia, Tobacco Use   Care Plan : Medication Adhernece  Updates made by  Riley Churches, RN since 07/29/2020 12:00 AM     Problem: Medication Adherence   Priority: High     Long-Range Goal: Medication Adherence maintained   Start Date: 07/29/2020  Expected End Date: 01/28/2021  This Visit's Progress: On track  Priority: High  Note:   Current Barriers:  Ineffective Self Health Maintenance in a patient with DM II, HTN, Mixed hyperlipidemia, Tobacco Use  Clinical Goal(s):  Collaboration with [name], FNP regarding development and update of comprehensive plan of care as evidenced by provider attestation and co-signature Inter-disciplinary care team collaboration (see longitudinal plan of care) patient will work with care management team to address care coordination and chronic disease management needs related to Disease Management Educational Needs Care Coordination Medication Management and Education Medication Reconciliation Psychosocial Support   Interventions:  07/29/20 completed successful inbound call with patient  Evaluation of current treatment plan related to DM II, HTN, Mixed hyperlipidemia, Tobacco Use ,  self-management and patient's adherence to plan as established by provider. Collaboration with [name], FNP regarding development and update of comprehensive plan of care as evidenced by provider attestation       and co-signature Inter-disciplinary care team collaboration (see longitudinal plan of care) Determined patient has several questions regarding her prescribed medication regimen Reviewed medications with patient and discussed importance of medication adherence, answered patient questions and confirmed patient has a better understanding of her prescribed medication regimen  Discussed plans with patient for ongoing care management follow up and provided patient with direct contact information for care management team Self Care Activities:  Self administers medications as prescribed Attends all scheduled provider  appointments Calls pharmacy for medication refills Calls provider office for new concerns or questions Patient Goals: - maintain medication adherence  Follow Up Plan: Telephone follow up appointment with care management team member scheduled for: 08/09/20     Plan:Telephone follow up appointment with care management team member scheduled for:  08/09/20  Delsa Sale, RN, BSN, CCM Care Management Coordinator [facility]  Direct Phone: [phone]

## 2020-08-05 NOTE — Patient Instructions (Signed)
Goals Addressed      Medication Adherence Maintained   On track    Timeframe:  Long-Range Goal Priority:  High Start Date:  07/29/20                           Expected End Date: 01/28/21  Next Scheduled follow up: 08/09/20  Self Care Activities:  Self administers medications as prescribed Attends all scheduled provider appointments Calls pharmacy for medication refills Calls provider office for new concerns or questions Patient Goals: - maintain medication adherence

## 2020-08-06 ENCOUNTER — Telehealth: Payer: Medicare PPO

## 2020-08-06 ENCOUNTER — Other Ambulatory Visit: Payer: Self-pay | Admitting: Nurse Practitioner

## 2020-08-06 DIAGNOSIS — E876 Hypokalemia: Secondary | ICD-10-CM

## 2020-08-06 DIAGNOSIS — E782 Mixed hyperlipidemia: Secondary | ICD-10-CM

## 2020-08-06 DIAGNOSIS — E781 Pure hyperglyceridemia: Secondary | ICD-10-CM

## 2020-08-09 ENCOUNTER — Telehealth: Payer: Self-pay

## 2020-08-09 ENCOUNTER — Other Ambulatory Visit: Payer: Self-pay | Admitting: *Deleted

## 2020-08-09 ENCOUNTER — Telehealth: Payer: Medicare PPO

## 2020-08-09 DIAGNOSIS — E782 Mixed hyperlipidemia: Secondary | ICD-10-CM

## 2020-08-09 NOTE — Telephone Encounter (Signed)
  Care Management   Follow Up Note   08/09/2020 Name: Gabriella Acosta MRN: [id number] DOB: 1945-08-15   Referred by: [name], FNP Reason for referral : Chronic Care Management (RN CM Follow up call )   An unsuccessful telephone outreach was attempted today. The patient was referred to the case management team for assistance with care management and care coordination.   Follow Up Plan: A HIPPA compliant phone message was left for the patient providing contact information and requesting a return call.   Delsa Sale, RN, BSN, CCM Care Management Coordinator [facility] Care Management/[facility]  Direct Phone: [phone]

## 2020-08-10 ENCOUNTER — Other Ambulatory Visit: Payer: Self-pay

## 2020-08-10 ENCOUNTER — Telehealth: Payer: Self-pay

## 2020-08-10 DIAGNOSIS — E876 Hypokalemia: Secondary | ICD-10-CM

## 2020-08-10 DIAGNOSIS — E782 Mixed hyperlipidemia: Secondary | ICD-10-CM

## 2020-08-10 DIAGNOSIS — E781 Pure hyperglyceridemia: Secondary | ICD-10-CM

## 2020-08-10 MED ORDER — NIACIN ER (ANTIHYPERLIPIDEMIC) 500 MG PO TBCR: EXTENDED_RELEASE_TABLET | ORAL | 1 refills | Status: DC

## 2020-08-10 MED ORDER — POTASSIUM CHLORIDE ER 10 MEQ PO TBCR: 10.0000 meq | EXTENDED_RELEASE_TABLET | Freq: Every day | ORAL | 1 refills | Status: DC

## 2020-08-10 MED ORDER — ATORVASTATIN CALCIUM 80 MG PO TABS: ORAL_TABLET | ORAL | 1 refills | Status: DC

## 2020-08-10 MED ORDER — AMLODIPINE BESYLATE 5 MG PO TABS: ORAL_TABLET | ORAL | 0 refills | Status: DC

## 2020-08-10 MED ORDER — SITAGLIPTIN PHOSPHATE 100 MG PO TABS: 100.0000 mg | ORAL_TABLET | Freq: Every day | ORAL | 0 refills | Status: DC

## 2020-08-10 NOTE — Chronic Care Management (AMB) (Signed)
Chronic Care Management Pharmacy Assistant   Name: Gabriella Acosta  MRN: [id number] DOB: [birth year]   Reason for Encounter: Medication Review/ Medication Coordination   Recent office visits:  07-20-2020 Bevelyn Ngo (CCM)  07-29-2020 Little, Karma Lew, RN (CCM)  Recent consult visits:  07-23-2020 Jeani Hawking, MD (Gastroenterology). ESOPHAGOGASTRODUODENOSCOPY (EGD) WITH PROPOFOL procedure  Hospital visits:  Medication Reconciliation was completed by comparing discharge summary, patient's EMR and Pharmacy list, and upon discussion with patient.   Admitted to the hospital on 05-26-2020 due to chest wall pain. Discharge date was 05-26-2020. Discharged from [facility].     New?Medications Started at [facility] Discharge:?? Percocet 5-325 MG one tablet every 6 hours as needed for pain.   Medication Changes at Hospital Discharge: None   Medications Discontinued at Hospital Discharge: None   Medications that remain the same after Hospital Discharge:?? -All other medications will remain the same.    Medications: Outpatient Encounter Medications as of 08/10/2020  Medication Sig Note   amLODipine (NORVASC) 5 MG tablet Take one tablet by mouth daily (Patient taking differently: Take 5 mg by mouth daily.)    atorvastatin (LIPITOR) 80 MG tablet TAKE ONE TABLET BY MOUTH BEFORE BREAKFAST    Blood Glucose Monitoring Suppl DEVI Check blood sugars twice daily. E11.9    Continuous Blood Gluc Sensor (FREESTYLE LIBRE 2 SENSOR) MISC Use to check blood sugars daily    gabapentin (NEURONTIN) 100 MG capsule Take 1 capsule (100 mg total) by mouth 3 (three) times daily. (Patient not taking: No sig reported)    Glucagon (GVOKE HYPOPEN 2-PACK) 0.5 MG/0.1ML SOAJ Inject 0.5 mg into the skin as needed. (Patient not taking: No sig reported)    glucose blood (ACCU-CHEK GUIDE) test strip Use as instructed    icosapent Ethyl (VASCEPA) 1 g capsule Take 2 capsules (2 g total) by mouth 2  (two) times daily.    insulin aspart (FIASP FLEXTOUCH) 100 UNIT/ML FlexTouch Pen Inject 6-10 Units into the skin daily as needed (if  blood glucose is in 200 or above). Sliding scale  Use as directed per sliding scale three times daily    Insulin Glargine (BASAGLAR KWIKPEN) 100 UNIT/ML Inject 30 Units into the skin at bedtime. 07/23/2020: Took 15 units last night, 07/22/2020      Insulin Pen Needle (B-D UF III MINI PEN NEEDLES) 31G X 5 MM MISC USE AS DIRECTED DAILY    Lancets (ACCU-CHEK SOFT TOUCH) lancets Use as instructed    losartan (COZAAR) 50 MG tablet Take 1 tablet (50 mg total) by mouth daily.    magnesium 30 MG tablet Take 30 mg by mouth once a week.    Multiple Vitamins-Minerals (MULTIVITAMIN WITH MINERALS) tablet Take 1 tablet by mouth daily.    niacin (NIASPAN) 500 MG CR tablet TAKE ONE TABLET BY MOUTH EVERYDAY AT BEDTIME    oxyCODONE-acetaminophen (PERCOCET) 5-325 MG tablet Take 1 tablet by mouth every 6 (six) hours as needed.    potassium chloride (KLOR-CON) 10 MEQ tablet TAKE ONE TABLET BY MOUTH ONCE DAILY    sitaGLIPtin (JANUVIA) 100 MG tablet Take 1 tablet (100 mg total) by mouth daily.    No facility-administered encounter medications on file as of 08/10/2020.   Reviewed chart for medication changes ahead of medication coordination call.  No OVs, Consults, or hospital visits since last care coordination call/Pharmacist visit. (If appropriate, list visit date, provider name)  No medication changes indicated OR if recent visit, treatment plan here.  BP Readings from  Last 3 Encounters:  07/23/20 118/66  05/26/20 (!) 148/74  05/10/20 124/61    Lab Results  Component Value Date   HGBA1C 14.1 (H) 03/25/2020     Patient obtains medications through Adherence Packaging  90 Days   Last adherence delivery included:  Atorvastatin 80 mg- 1 tablet daily (before breakfast)  Potassium Chloride 10 mg- 1 tablet daily (before breakfast) Amlodipine 5 mg- 1 tablet daily (before  breakfast) Niacin Er 500 mg- 1 tablet daily (bedtime) Patient declined (meds) last month due to PRN use/additional supply on hand.  Patient is due for next adherence delivery on: 08-18-2020 Called patient and reviewed medications and coordinated delivery.  This delivery to include: Atorvastatin 80 mg- 1 tablet daily (before breakfast)  Potassium Chloride 10 mg- 1 tablet daily (breakfast) Amlodipine 5 mg- 1 tablet daily (before breakfast) Niacin Er 500 mg- 1 tablet daily (bedtime) Januvia 100 mg- 1 tablet daily (breakfast)  No acute or short fill needed  Patient declined the following medications: None  Patient needs refills for: Niacin Er 500 mg- 1 tablet daily (bedtime) Potassium Chloride 10 mg- 1 tablet daily (before breakfast) Atorvastatin 80 mg- 1 tablet daily (before breakfast)  Amlodipine 5 mg- 1 tablet daily (before breakfast) Januvia 100 mg- 1 tablet daily (breakfast)  Confirmed delivery date of 08-18-2020 advised patient that pharmacy will contact them the morning of delivery.  Care Gaps: Shingrix overdue Yearly foot exam overdue Colonoscopy overdue Covid booster overdue Medicare wellness 08-12-2020  Star Rating Drugs: Atorvastatin 80 mg- Last filled 05-18-2020 90 [facility] Losartan 50 mg- Last filled 07-29-2020 30 DS [facility] Januvia 100 mg- Last filled 06-14-2020 [facility]  Huey Romans CMA Clinical Pharmacist Assistant [phone]

## 2020-08-11 DIAGNOSIS — E1165 Type 2 diabetes mellitus with hyperglycemia: Secondary | ICD-10-CM | POA: Diagnosis not present

## 2020-08-12 ENCOUNTER — Ambulatory Visit (INDEPENDENT_AMBULATORY_CARE_PROVIDER_SITE_OTHER): Payer: Medicare PPO

## 2020-08-12 VITALS — Ht 64.0 in | Wt 118.0 lb

## 2020-08-12 DIAGNOSIS — Z0000 Encounter for general adult medical examination without abnormal findings: Secondary | ICD-10-CM | POA: Diagnosis not present

## 2020-08-12 NOTE — Patient Instructions (Signed)
Gabriella Acosta , Thank you for taking time to come for your Medicare Wellness Visit. I appreciate your ongoing commitment to your health goals. Please review the following plan we discussed and let me know if I can assist you in the future.   Screening recommendations/referrals: Colonoscopy: passes at this time Mammogram: scheduled 09/21/2020 Bone Density: scheduled 09/21/2020 Recommended yearly ophthalmology/optometry visit for glaucoma screening and checkup Recommended yearly dental visit for hygiene and checkup  Vaccinations: Influenza vaccine: decline Pneumococcal vaccine: decline Tdap vaccine: completed 07/29/2013, due 07/30/2023 Shingles vaccine: discussed   Covid-19: 07/16/2020, 01/05/2020, 03/17/2019  Advanced directives: Advance directive discussed with you today.   Conditions/risks identified: smoking  Next appointment: Follow up in one year for your annual wellness visit    Preventive Care 65 Years and Older, Female Preventive care refers to lifestyle choices and visits with your health care provider that can promote health and wellness. What does preventive care include? A yearly physical exam. This is also called an annual well check. Dental exams once or twice a year. Routine eye exams. Ask your health care provider how often you should have your eyes checked. Personal lifestyle choices, including: Daily care of your teeth and gums. Regular physical activity. Eating a healthy diet. Avoiding tobacco and drug use. Limiting alcohol use. Practicing safe sex. Taking low-dose aspirin every day. Taking vitamin and mineral supplements as recommended by your health care provider. What happens during an annual well check? The services and screenings done by your health care provider during your annual well check will depend on your age, overall health, lifestyle risk factors, and family history of disease. Counseling  Your health care provider may ask you questions about  your: Alcohol use. Tobacco use. Drug use. Emotional well-being. Home and relationship well-being. Sexual activity. Eating habits. History of falls. Memory and ability to understand (cognition). Work and work Astronomer. Reproductive health. Screening  You may have the following tests or measurements: Height, weight, and BMI. Blood pressure. Lipid and cholesterol levels. These may be checked every 5 years, or more frequently if you are over 89 years old. Skin check. Lung cancer screening. You may have this screening every year starting at age 58 if you have a 30-pack-year history of smoking and currently smoke or have quit within the past 15 years. Fecal occult blood test (FOBT) of the stool. You may have this test every year starting at age 77. Flexible sigmoidoscopy or colonoscopy. You may have a sigmoidoscopy every 5 years or a colonoscopy every 10 years starting at age 56. Hepatitis C blood test. Hepatitis B blood test. Sexually transmitted disease (STD) testing. Diabetes screening. This is done by checking your blood sugar (glucose) after you have not eaten for a while (fasting). You may have this done every 1-3 years. Bone density scan. This is done to screen for osteoporosis. You may have this done starting at age 41. Mammogram. This may be done every 1-2 years. Talk to your health care provider about how often you should have regular mammograms. Talk with your health care provider about your test results, treatment options, and if necessary, the need for more tests. Vaccines  Your health care provider may recommend certain vaccines, such as: Influenza vaccine. This is recommended every year. Tetanus, diphtheria, and acellular pertussis (Tdap, Td) vaccine. You may need a Td booster every 10 years. Zoster vaccine. You may need this after age 79. Pneumococcal 13-valent conjugate (PCV13) vaccine. One dose is recommended after age 11. Pneumococcal polysaccharide (PPSV23) vaccine.  One  dose is recommended after age 60. Talk to your health care provider about which screenings and vaccines you need and how often you need them. This information is not intended to replace advice given to you by your health care provider. Make sure you discuss any questions you have with your health care provider. Document Released: 01/22/2015 Document Revised: 09/15/2015 Document Reviewed: 10/27/2014 Elsevier Interactive Patient Education  2017 [place] Prevention in the Home Falls can cause injuries. They can happen to people of all ages. There are many things you can do to make your home safe and to help prevent falls. What can I do on the outside of my home? Regularly fix the edges of walkways and driveways and fix any cracks. Remove anything that might make you trip as you walk through a door, such as a raised step or threshold. Trim any bushes or trees on the path to your home. Use bright outdoor lighting. Clear any walking paths of anything that might make someone trip, such as rocks or tools. Regularly check to see if handrails are loose or broken. Make sure that both sides of any steps have handrails. Any raised decks and porches should have guardrails on the edges. Have any leaves, snow, or ice cleared regularly. Use sand or salt on walking paths during winter. Clean up any spills in your garage right away. This includes oil or grease spills. What can I do in the bathroom? Use night lights. Install grab bars by the toilet and in the tub and shower. Do not use towel bars as grab bars. Use non-skid mats or decals in the tub or shower. If you need to sit down in the shower, use a plastic, non-slip stool. Keep the floor dry. Clean up any water that spills on the floor as soon as it happens. Remove soap buildup in the tub or shower regularly. Attach bath mats securely with double-sided non-slip rug tape. Do not have throw rugs and other things on the floor that can make  you trip. What can I do in the bedroom? Use night lights. Make sure that you have a light by your bed that is easy to reach. Do not use any sheets or blankets that are too big for your bed. They should not hang down onto the floor. Have a firm chair that has side arms. You can use this for support while you get dressed. Do not have throw rugs and other things on the floor that can make you trip. What can I do in the kitchen? Clean up any spills right away. Avoid walking on wet floors. Keep items that you use a lot in easy-to-reach places. If you need to reach something above you, use a strong step stool that has a grab bar. Keep electrical cords out of the way. Do not use floor polish or wax that makes floors slippery. If you must use wax, use non-skid floor wax. Do not have throw rugs and other things on the floor that can make you trip. What can I do with my stairs? Do not leave any items on the stairs. Make sure that there are handrails on both sides of the stairs and use them. Fix handrails that are broken or loose. Make sure that handrails are as long as the stairways. Check any carpeting to make sure that it is firmly attached to the stairs. Fix any carpet that is loose or worn. Avoid having throw rugs at the top or bottom of the stairs.  If you do have throw rugs, attach them to the floor with carpet tape. Make sure that you have a light switch at the top of the stairs and the bottom of the stairs. If you do not have them, ask someone to add them for you. What else can I do to help prevent falls? Wear shoes that: Do not have high heels. Have rubber bottoms. Are comfortable and fit you well. Are closed at the toe. Do not wear sandals. If you use a stepladder: Make sure that it is fully opened. Do not climb a closed stepladder. Make sure that both sides of the stepladder are locked into place. Ask someone to hold it for you, if possible. Clearly mark and make sure that you can  see: Any grab bars or handrails. First and last steps. Where the edge of each step is. Use tools that help you move around (mobility aids) if they are needed. These include: Canes. Walkers. Scooters. Crutches. Turn on the lights when you go into a dark area. Replace any light bulbs as soon as they burn out. Set up your furniture so you have a clear path. Avoid moving your furniture around. If any of your floors are uneven, fix them. If there are any pets around you, be aware of where they are. Review your medicines with your doctor. Some medicines can make you feel dizzy. This can increase your chance of falling. Ask your doctor what other things that you can do to help prevent falls. This information is not intended to replace advice given to you by your health care provider. Make sure you discuss any questions you have with your health care provider. Document Released: 10/22/2008 Document Revised: 06/03/2015 Document Reviewed: 01/30/2014 Elsevier Interactive Patient Education  2017 [employer].
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
## 2020-08-18 DIAGNOSIS — R933 Abnormal findings on diagnostic imaging of other parts of digestive tract: Secondary | ICD-10-CM | POA: Diagnosis not present

## 2020-08-18 DIAGNOSIS — J324 Chronic pansinusitis: Secondary | ICD-10-CM | POA: Diagnosis not present

## 2020-08-18 DIAGNOSIS — K861 Other chronic pancreatitis: Secondary | ICD-10-CM | POA: Diagnosis not present

## 2020-08-19 ENCOUNTER — Telehealth: Payer: Self-pay | Admitting: Nurse Practitioner

## 2020-08-19 NOTE — Telephone Encounter (Signed)
   Telephone encounter was:  Unsuccessful.  08/19/2020 Name: Gabriella Acosta MRN: [id number] DOB: Oct 18, 1945  Unsuccessful outbound call made today to assist with:  Food Insecurity and Financial Difficulties related to paying rent.  Outreach Attempt:  1st Attempt  Phone just rang and rang with no voice mail. Will call back.   April Green Care Guide, Embedded Care Coordination [facility], Care Management Phone: [phone] Email: [email or web address]

## 2020-08-25 ENCOUNTER — Ambulatory Visit: Payer: Medicare PPO | Admitting: Internal Medicine

## 2020-08-31 ENCOUNTER — Ambulatory Visit (INDEPENDENT_AMBULATORY_CARE_PROVIDER_SITE_OTHER): Payer: Medicare PPO

## 2020-08-31 DIAGNOSIS — E1165 Type 2 diabetes mellitus with hyperglycemia: Secondary | ICD-10-CM

## 2020-08-31 DIAGNOSIS — I10 Essential (primary) hypertension: Secondary | ICD-10-CM

## 2020-08-31 DIAGNOSIS — E782 Mixed hyperlipidemia: Secondary | ICD-10-CM

## 2020-08-31 NOTE — Patient Instructions (Signed)
Social Worker Visit Information  Goals we discussed today:   Goals Addressed             This Visit's Progress    COMPLETED: Barriers to Treatment Identified and Managed       Timeframe:  Long-Range Goal Priority:  Low Start Date: 1.20.22                             Patient Goals/Self-Care Activities patient will:   - Contact SW as needed         Materials Provided: No: Patient declined  Follow Up Plan:  No follow up planned at this time. Please contact me as needed.   Bevelyn Ngo, BSW, CDP Social Worker, Certified Dementia Practitioner [facility] Care Management [phone]
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
## 2020-09-01 ENCOUNTER — Telehealth: Payer: Self-pay

## 2020-09-01 NOTE — Chronic Care Management (AMB) (Signed)
    Called Barrie Dunker, No answer, unable to leave a voicemail of appointment on 09-02-2020 at 2:00 via telephone visit with [name], Pharm D.   Care Gaps: Shingrix overdue Yearly foot exam overdue Colonoscopy overdue Covid booster overdue Medicare wellness 09-21-2021  Star Rating Drug: Atorvastatin 80 mg- Last filled 08-13-2020 [facility] Losartan 50 mg- Last filled 07-29-2020 30 DS [facility] Januvia 100 mg- Last filled 08-16-2020 [facility]  Any gaps in medications fill history? No  Huey Romans [facility] Clinical Pharmacist Assistant [phone]

## 2020-09-02 ENCOUNTER — Ambulatory Visit: Payer: Medicare PPO

## 2020-09-02 DIAGNOSIS — E782 Mixed hyperlipidemia: Secondary | ICD-10-CM

## 2020-09-02 DIAGNOSIS — I10 Essential (primary) hypertension: Secondary | ICD-10-CM

## 2020-09-02 DIAGNOSIS — E1165 Type 2 diabetes mellitus with hyperglycemia: Secondary | ICD-10-CM | POA: Diagnosis not present

## 2020-09-02 NOTE — Progress Notes (Signed)
Chronic Care Management Pharmacy Note  09/07/2020 Name:  Gabriella Acosta MRN:  [id number] DOB:  [birth year]  Summary: Patient reports that she is doing well.   Recommendations/Changes made from today's visit: Recommend patient limit the amount of fried and fatty foods she is eating. Recommend patient try to check her BP at home.    Plan: Patient at this time is going to limit the amount of fried and fatty foods she is eating.   Subjective: Gabriella Acosta is an 75 y.o. year old female who is a primary patient of [name], [place].  The CCM team was consulted for assistance with disease management and care coordination needs.    Engaged with patient by telephone for follow up visit in response to provider referral for pharmacy case management and/or care coordination services. Patient reports she is now working full time at a remote job.    Consent to Services:  The patient was given information about Chronic Care Management services, agreed to services, and gave verbal consent prior to initiation of services.  Please see initial visit note for detailed documentation.   Patient Care Team: [name], FNP as PCP - General (General Practice) Mayford Knife, [facility] (Pharmacist)  Recent office visits: 05/05/2020 PCP [facility] visits: None in previous 6 months   Objective:  Lab Results  Component Value Date   CREATININE 0.86 05/26/2020   BUN 17 05/26/2020   GFRNONAA >60 05/26/2020   GFRAA 102 10/20/2019   NA 133 (L) 05/26/2020   K 4.9 05/26/2020   CALCIUM 9.8 05/26/2020   CO2 26 05/26/2020   GLUCOSE 352 (H) 05/26/2020    Lab Results  Component Value Date/Time   HGBA1C 14.1 (H) 03/25/2020 12:13 PM   HGBA1C 14.0 (H) 10/20/2019 10:22 AM   MICROALBUR 30 12/10/2018 05:09 PM   MICROALBUR 150 10/31/2017 12:47 PM    Last diabetic Eye exam:  Lab Results  Component Value Date/Time   HMDIABEYEEXA No Retinopathy 09/02/2019 12:00 AM    Last diabetic Foot  exam: No results found for: HMDIABFOOTEX   Lab Results  Component Value Date   CHOL 185 03/25/2020   HDL 40 03/25/2020   LDLCALC 91 03/25/2020   TRIG 324 (H) 03/25/2020   CHOLHDL 4.6 (H) 03/25/2020    Hepatic Function Latest Ref Rng & Units 05/26/2020 03/25/2020 10/20/2019  Total Protein 6.5 - 8.1 g/dL 7.8 7.4 7.7  Albumin 3.5 - 5.0 g/dL 4.0 4.6 4.5  AST 15 - 41 U/L _0 ALT 0 - 44 U/L 21 39(H) 21  Alk Phosphatase 38 - 126 U/L 122 161(H) 185(H)  Total Bilirubin 0.3 - 1.2 mg/dL 0.9 0.3 0.3    Lab Results  Component Value Date/Time   TSH 1.650 10/20/2019 10:22 AM    CBC Latest Ref Rng & Units 05/26/2020 10/20/2019 07/30/2019  WBC 4.0 - 10.5 K/uL 10.6(H) 12.8(H) 9.9  Hemoglobin 12.0 - 15.0 g/dL 13.4 13.5 12.6  Hematocrit 36.0 - 46.0 % 40.5 42.2 37.7  Platelets 150 - 400 K/uL 120(L) 132(L) 92(LL)    No results found for: VD25OH  Clinical ASCVD: No  The ASCVD Risk score Mikey Bussing DC Jr., et al., 2013) failed to calculate for the following reasons:   The systolic blood pressure is missing    Depression screen [facility] 2/9 08/12/2020 07/30/2019 02/04/2019  Decreased Interest 0 0 0  Down, Depressed, Hopeless 0 0 0  PHQ - 2 Score 0 0 0  Altered sleeping - - -  Tired, decreased energy - - -  Change in appetite - - -  Feeling bad or failure about yourself  - - -  Trouble concentrating - - -  Moving slowly or fidgety/restless - - -  Suicidal thoughts - - -  PHQ-9 Score - - -  Difficult doing work/chores - - -     Social History   Tobacco Use  Smoking Status Every Day   Packs/day: 0.25   Types: Cigarettes  Smokeless Tobacco Current   BP Readings from Last 3 Encounters:  07/23/20 118/66  05/26/20 (!) 148/74  05/10/20 124/61   Pulse Readings from Last 3 Encounters:  07/23/20 66  05/26/20 62  05/10/20 74   Wt Readings from Last 3 Encounters:  08/12/20 118 lb (53.5 kg)  07/23/20 115 lb (52.2 kg)  05/10/20 123 lb 9.6 oz (56.1 kg)   BMI Readings from Last 3 Encounters:   08/12/20 20.25 kg/m  07/23/20 19.74 kg/m  05/10/20 21.22 kg/m    Assessment/Interventions: Review of patient past medical history, allergies, medications, health status, including review of consultants reports, laboratory and other test data, was performed as part of comprehensive evaluation and provision of chronic care management services.   SDOH:  (Social Determinants of Health) assessments and interventions performed: No  SDOH Screenings   Alcohol Screen: Not on file  Depression (PHQ2-9): Low Risk    PHQ-2 Score: 0  Financial Resource Strain: High Risk   Difficulty of Paying Living Expenses: Hard  Food Insecurity: Food Insecurity Present   Worried About Charity fundraiser in the Last Year: Often true   Arboriculturist in the Last Year: Often true  Housing: Not on file  Physical Activity: Inactive   Days of Exercise per Week: 0 days   Minutes of Exercise per Session: 0 min  Social Connections: Not on file  Stress: No Stress Concern Present   Feeling of Stress : Not at all  Tobacco Use: High Risk   Smoking Tobacco Use: Every Day   Smokeless Tobacco Use: Current  Transportation Needs: No Transportation Needs   Lack of Transportation (Medical): No   Lack of Transportation (Non-Medical): No    CCM Care Plan  No Known Allergies  Medications Reviewed Today     Reviewed by Kellie Simmering, LPN (Licensed Practical Nurse) on 08/12/20 at 82  Med List Status: <None>   Medication Order Taking? Sig Documenting Provider Last Dose Status Informant  amLODipine (NORVASC) 5 MG tablet [id number] Yes Take one tablet by mouth daily [name], FNP Taking Active   atorvastatin (LIPITOR) 80 MG tablet [id number] Yes TAKE ONE TABLET BY MOUTH BEFORE Evelena Peat, FNP Taking Active   Blood Glucose Monitoring Suppl DEVI [id number] Yes Check blood sugars twice daily. E11.9 [name], FNP Taking Active Self  Continuous Blood Gluc Sensor (FREESTYLE LIBRE 2 SENSOR) MISC  [id number] Yes Use to check blood sugars daily [name], FNP Taking Active Self  gabapentin (NEURONTIN) 100 MG capsule [id number] No Take 1 capsule (100 mg total) by mouth 3 (three) times daily.  Patient not taking: No sig reported   [name], FNP Not Taking Active Self  Glucagon (GVOKE HYPOPEN 2-PACK) 0.5 MG/0.1ML SOAJ [id number] No Inject 0.5 mg into the skin as needed.  Patient not taking: No sig reported   [name], FNP Not Taking Active Self  glucose blood (ACCU-CHEK GUIDE) test strip [id number] Yes Use as instructed [name], FNP Taking Active Self  icosapent Ethyl (VASCEPA) 1 g  capsule [id number] Yes Take 2 capsules (2 g total) by mouth 2 (two) times daily. Pixie Casino, MD Taking Active Self  insulin aspart (FIASP FLEXTOUCH) 100 UNIT/ML FlexTouch Pen [id number] Yes Inject 6-10 Units into the skin daily as needed (if  blood glucose is in 200 or above). Sliding scale  Use as directed per sliding scale three times daily [provider] Taking Active Self  Insulin Glargine (BASAGLAR KWIKPEN) 100 UNIT/ML [id number] Yes Inject 30 Units into the skin at bedtime. [provider] Taking Active Self           Med Note Alvino Chapel, Pamalee Leyden   Fri Jul 23, 2020 11:28 AM) Took 15 units last night, 07/22/2020     Insulin Pen Needle (B-D UF III MINI PEN NEEDLES) 31G X 5 MM MISC [id number] Yes USE AS DIRECTED DAILY [name], FNP Taking Active Self  Lancets (ACCU-CHEK SOFT TOUCH) lancets [id number] Yes Use as instructed [name], FNP Taking Active Self  losartan (COZAAR) 50 MG tablet [id number] Yes Take 1 tablet (50 mg total) by mouth daily. [name], FNP Taking Active   magnesium 30 MG tablet [id number] Yes Take 30 mg by mouth once a week. [provider] Taking Active Self  Multiple Vitamins-Minerals (MULTIVITAMIN WITH MINERALS) tablet [id number] Yes Take 1 tablet by mouth daily. [provider] Taking Active Self  niacin (NIASPAN) 500 MG CR  tablet [id number] Yes TAKE ONE TABLET BY MOUTH EVERYDAY AT BEDTIME [name], FNP Taking Active   oxyCODONE-acetaminophen (PERCOCET) 5-325 MG tablet [id number] Yes Take 1 tablet by mouth every 6 (six) hours as needed. [name], FNP Taking Active   potassium chloride (KLOR-CON) 10 MEQ tablet [id number] Yes Take 1 tablet (10 mEq total) by mouth daily. [name], FNP Taking Active   sitaGLIPtin (JANUVIA) 100 MG tablet [id number] Yes Take 1 tablet (100 mg total) by mouth daily. [name], FNP Taking Active             Patient Active Problem List   Diagnosis Date Noted   Tobacco abuse 07/22/2020   Type 2 diabetes mellitus ([place]) 07/22/2020   Cognitive deficits 11/07/2018   ETOH abuse 07/30/2013   Anxiety 07/30/2013   Essential hypertension 01/31/2011    Immunization History  Administered Date(s) Administered   Fluad Quad(high Dose 65+) 10/20/2019   Janssen (J&J) SARS-COV-2 Vaccination 03/17/2019   PFIZER(Purple Top)SARS-COV-2 Vaccination 01/05/2020, 07/16/2020   Tdap 01/25/2013, 07/29/2013    Conditions to be addressed/monitored:  Hyperlipidemia and Diabetes  Care Plan : [place]  Updates made by Mayford Knife, [place] since 09/07/2020 12:00 AM     Problem: HLD, DM   Priority: High     Long-Range Goal: Disease Management   Recent Progress: Not on track  Priority: High  Note:    Current Barriers:  Unable to independently monitor therapeutic efficacy Unable to achieve control of diabetes   Unable to maintain control of cholesterol   Pharmacist Clinical Goal(s):  Patient will achieve adherence to monitoring guidelines and medication adherence to achieve therapeutic efficacy through collaboration with PharmD and provider.   Interventions: 1:1 collaboration with [name], FNP regarding development and update of comprehensive plan of care as evidenced by provider attestation and co-signature Inter-disciplinary care team collaboration (see  longitudinal plan of care) Comprehensive medication review performed; medication list updated in electronic medical record  Hyperlipidemia: (LDL goal < 70) -Uncontrolled -Current treatment: Atorvastatin 80 mg tablet Vascepa 1 gram capsule twice daily  -Medications previously tried: will  follow up  -Current dietary patterns: lowered the the amount of fried and fatty foods that she is eating  -Current exercise habits: patient reports at this time she is not exercising.  -Educated on Benefits of statin for ASCVD risk reduction; Exercise goal of 150 minutes per week; -Counseled on diet and exercise extensively Recommended to continue current medication    Diabetes (A1c goal <7%) -Uncontrolled -Current medications: Januvia 100 mg tablet daily  GVOKE Hypopen 2- Pack Insulin Aspart 100 unit/ml  - inject 6-10 units into the skin daily as needed  (if blood glucose is in 200 or above.) Insulin Glargine 100 unit/ml - inject 30 units into the skin at bedtime  -Current home glucose readings fasting glucose: 118,112 Post prandial -  -Denies hypoglycemic/hyperglycemic symptoms -Current meal patterns: will follow up with patient  -Current exercise: will discuss with patient  -Educated on A1c and blood sugar goals; Carbohydrate counting and/or plate method -Counseled to check feet daily and get yearly eye exams -Recommended to continue current medication  Patient Goals/Self-Care Activities Patient will:  - take medications as prescribed  Follow Up Plan: Telephone follow up appointment with care management team member scheduled for: The patient has been provided with contact information for the care management team and has been advised to call with any health related questions or concerns.       Medication Assistance: None required.  Patient affirms current coverage meets needs.  Compliance/Adherence/Medication fill history: Care Gaps: Foot Exam Colonoscopy Shingrix  vaccine Ophthalmology Exam   Star-Rating Drugs: Atorvastatin 80 mg Januvia 100 mg Losartan 50 mg   Patient's preferred pharmacy is:  [id number] [address] [place] [address] Phone: [phone] Fax: [phone]  [facility] - [place], [place] - [address] [place] [address] Phone: [phone] Fax: [phone]  Uses pill box? Yes Pt endorses 95% compliance  We discussed: Benefits of medication synchronization, packaging and delivery as well as enhanced pharmacist oversight with [facility]. Patient decided to: Continue current medication management strategy  Care Plan and Follow Up Patient Decision:  Patient agrees to Care Plan and Follow-up.  Plan: The patient has been provided with contact information for the care management team and has been advised to call with any health related questions or concerns.   [name], PharmD Clinical Pharmacist [facility] [phone]

## 2020-09-07 NOTE — Patient Instructions (Signed)
Visit Information It was great speaking with you today!  Please let me know if you have any questions about our visit.   Goals Addressed             This Visit's Progress    Manage My Medicine       Timeframe:  Long-Range Goal Priority:  High Start Date: 05/05/2020                            Expected End Date: 05/05/2021                      Follow Up Date 09/14/2020   - call for medicine refill 2 or 3 days before it runs out - keep a list of all the medicines I take; vitamins and herbals too - use a pillbox to sort medicine - use an alarm clock or phone to remind me to take my medicine    Why is this important?   These steps will help you keep on track with your medicines.           Patient Care Plan: CCM Pharmacy Care Plan     Problem Identified: HLD, DM   Priority: High     Long-Range Goal: Disease Management   Recent Progress: Not on track  Priority: High  Note:    Current Barriers:  Unable to independently monitor therapeutic efficacy Unable to achieve control of diabetes   Unable to maintain control of cholesterol   Pharmacist Clinical Goal(s):  Patient will achieve adherence to monitoring guidelines and medication adherence to achieve therapeutic efficacy through collaboration with PharmD and provider.   Interventions: 1:1 collaboration with [name], FNP regarding development and update of comprehensive plan of care as evidenced by provider attestation and co-signature Inter-disciplinary care team collaboration (see longitudinal plan of care) Comprehensive medication review performed; medication list updated in electronic medical record  Hyperlipidemia: (LDL goal < 70) -Uncontrolled -Current treatment: Atorvastatin 80 mg tablet Vascepa 1 gram capsule twice daily  -Medications previously tried: will follow up  -Current dietary patterns: lowered the the amount of fried and fatty foods that she is eating  -Current exercise habits: patient  reports at this time she is not exercising.  -Educated on Benefits of statin for ASCVD risk reduction; Exercise goal of 150 minutes per week; -Counseled on diet and exercise extensively Recommended to continue current medication    Diabetes (A1c goal <7%) -Uncontrolled -Current medications: Januvia 100 mg tablet daily  GVOKE Hypopen 2- Pack Insulin Aspart 100 unit/ml  - inject 6-10 units into the skin daily as needed  (if blood glucose is in 200 or above.) Insulin Glargine 100 unit/ml - inject 30 units into the skin at bedtime  -Current home glucose readings fasting glucose: 118,112 Post prandial -  -Denies hypoglycemic/hyperglycemic symptoms -Current meal patterns: will follow up with patient  -Current exercise: will discuss with patient  -Educated on A1c and blood sugar goals; Carbohydrate counting and/or plate method -Counseled to check feet daily and get yearly eye exams -Recommended to continue current medication  Patient Goals/Self-Care Activities Patient will:  - take medications as prescribed  Follow Up Plan: Telephone follow up appointment with care management team member scheduled for: The patient has been provided with contact information for the care management team and has been advised to call with any health related questions or concerns.        Patient agreed to services and  verbal consent obtained.   The patient verbalized understanding of instructions, educational materials, and care plan provided today and agreed to receive a mailed copy of patient instructions, educational materials, and care plan.   [name], PharmD Clinical Pharmacist [facility] [phone]

## 2020-09-10 DIAGNOSIS — E1165 Type 2 diabetes mellitus with hyperglycemia: Secondary | ICD-10-CM | POA: Diagnosis not present

## 2020-09-14 ENCOUNTER — Telehealth: Payer: Medicare PPO

## 2020-09-15 ENCOUNTER — Ambulatory Visit: Payer: Medicare PPO | Admitting: Internal Medicine

## 2020-09-21 ENCOUNTER — Other Ambulatory Visit: Payer: Medicare PPO

## 2020-09-21 ENCOUNTER — Ambulatory Visit: Payer: Medicare PPO

## 2020-09-23 ENCOUNTER — Ambulatory Visit: Payer: Self-pay

## 2020-09-23 ENCOUNTER — Ambulatory Visit (INDEPENDENT_AMBULATORY_CARE_PROVIDER_SITE_OTHER): Payer: Medicare PPO

## 2020-09-23 ENCOUNTER — Telehealth: Payer: Medicare PPO

## 2020-09-23 DIAGNOSIS — E782 Mixed hyperlipidemia: Secondary | ICD-10-CM

## 2020-09-23 DIAGNOSIS — E1165 Type 2 diabetes mellitus with hyperglycemia: Secondary | ICD-10-CM

## 2020-09-23 DIAGNOSIS — I10 Essential (primary) hypertension: Secondary | ICD-10-CM

## 2020-09-23 DIAGNOSIS — Z720 Tobacco use: Secondary | ICD-10-CM

## 2020-09-23 NOTE — Patient Instructions (Signed)
Social Worker Visit Information  Goals we discussed today:   Goals Addressed             This Visit's Progress    [place] Nutrition Received (SW)       Timeframe:  Short-Term Goal Priority:  High Start Date:  9.15.22                           Expected End Date: 10.15.22                       Patient Goals/Self-Care Activities patient will:   - Review provided food pantry list -Contact SW as needed prior to next scheduled call         Materials Provided: Yes: provided list of local food pantries  Patient verbalizes understanding of instructions provided today and agrees to view in MyChart.   Follow Up Plan: SW will follow up with patient by phone over the next week  Bevelyn Ngo, BSW, CDP Social Worker, Certified Dementia Practitioner [facility] Care Management [phone]

## 2020-09-23 NOTE — Chronic Care Management (AMB) (Signed)
Chronic Care Management    Social Work Note  09/23/2020 Name: Gabriella Acosta MRN: [id number] DOB: [birth year]  Gabriella Acosta is a 75 y.o. year old female who is a primary care patient of [name], FNP. The CCM team was consulted to assist the patient with chronic disease management and/or care coordination needs related to: Food Insecurity.   Engaged with patient by telephone for follow up visit in response to provider referral for social work chronic care management and care coordination services.   Consent to Services:  The patient was given information about Chronic Care Management services, agreed to services, and gave verbal consent prior to initiation of services.  Please see initial visit note for detailed documentation.   Patient agreed to services and consent obtained.   Assessment: Review of patient past medical history, allergies, medications, and health status, including review of relevant consultants reports was performed today as part of a comprehensive evaluation and provision of chronic care management and care coordination services.     SDOH (Social Determinants of Health) assessments and interventions performed:  SDOH Interventions    Flowsheet Row Most Recent Value  SDOH Interventions   Food Insecurity Interventions Other (Comment), [id number] Referral  [provided with list of local food pantries]        Advanced Directives Status: Not addressed in this encounter.  CCM Care Plan  No Known Allergies  Outpatient Encounter Medications as of 09/23/2020  Medication Sig Note   amLODipine (NORVASC) 5 MG tablet Take one tablet by mouth daily    atorvastatin (LIPITOR) 80 MG tablet TAKE ONE TABLET BY MOUTH BEFORE BREAKFAST    Blood Glucose Monitoring Suppl DEVI Check blood sugars twice daily. E11.9    Continuous Blood Gluc Sensor (FREESTYLE LIBRE 2 SENSOR) MISC Use to check blood sugars daily    gabapentin (NEURONTIN) 100 MG capsule Take 1 capsule (100 mg  total) by mouth 3 (three) times daily. (Patient not taking: No sig reported)    Glucagon (GVOKE HYPOPEN 2-PACK) 0.5 MG/0.1ML SOAJ Inject 0.5 mg into the skin as needed. (Patient not taking: No sig reported)    glucose blood (ACCU-CHEK GUIDE) test strip Use as instructed    icosapent Ethyl (VASCEPA) 1 g capsule Take 2 capsules (2 g total) by mouth 2 (two) times daily.    insulin aspart (FIASP FLEXTOUCH) 100 UNIT/ML FlexTouch Pen Inject 6-10 Units into the skin daily as needed (if  blood glucose is in 200 or above). Sliding scale  Use as directed per sliding scale three times daily    Insulin Glargine (BASAGLAR KWIKPEN) 100 UNIT/ML Inject 30 Units into the skin at bedtime. 07/23/2020: Took 15 units last night, 07/22/2020      Insulin Pen Needle (B-D UF III MINI PEN NEEDLES) 31G X 5 MM MISC USE AS DIRECTED DAILY    Lancets (ACCU-CHEK SOFT TOUCH) lancets Use as instructed    losartan (COZAAR) 50 MG tablet Take 1 tablet (50 mg total) by mouth daily.    magnesium 30 MG tablet Take 30 mg by mouth once a week.    Multiple Vitamins-Minerals (MULTIVITAMIN WITH MINERALS) tablet Take 1 tablet by mouth daily.    niacin (NIASPAN) 500 MG CR tablet TAKE ONE TABLET BY MOUTH EVERYDAY AT BEDTIME    oxyCODONE-acetaminophen (PERCOCET) 5-325 MG tablet Take 1 tablet by mouth every 6 (six) hours as needed.    potassium chloride (KLOR-CON) 10 MEQ tablet Take 1 tablet (10 mEq total) by mouth daily.    sitaGLIPtin (JANUVIA) 100  MG tablet Take 1 tablet (100 mg total) by mouth daily.    No facility-administered encounter medications on file as of 09/23/2020.    Patient Active Problem List   Diagnosis Date Noted   Tobacco abuse 07/22/2020   Type 2 diabetes mellitus (HCC) 07/22/2020   Cognitive deficits 11/07/2018   ETOH abuse 07/30/2013   Anxiety 07/30/2013   Essential hypertension 01/31/2011    Conditions to be addressed/monitored: HTN, DMII, and Mixed Hyperlipidemia ; Limited access to food  Care Plan : Social  Work [facility] Care Plan  Updates made by Bevelyn Ngo since 09/23/2020 12:00 AM     Problem: Healthy Nutrition (Wellness)      Goal: Healthy Nutrition Achieved   Start Date: 09/23/2020  Expected End Date: 10/23/2020  This Visit's Progress: On track  Priority: High  Note:   Current Barriers:  Chronic disease management support and education needs related to HTN, DM, and Mixed Hyperlipidemia   Limited access to food  Social Worker Clinical Goal(s):  patient will work with SW to identify and address any acute and/or chronic care coordination needs related to the self health management of HTN, DM, and Mixed Hyperlipidemia   explore community resource options for unmet needs related FX:TKWI: Food insecurities patient is unable to afford groceries   SW Interventions:  Inter-disciplinary care team collaboration (see longitudinal plan of care) Collaboration with [name], FNP regarding development and update of comprehensive plan of care as evidenced by provider attestation and co-signature Collaboration with RN Care Manager who indicates patient difficulty affording food Successful outbound call placed to the patient to determine the patient is eating one meal per day and often running out of food without the ability to purchase more Discussed the patient has returned to work full time but does not have many funds leftover after paying bills Determined the patient has applied for food and nutrition benefits but was told she makes too much money Provided the patient with a written list of local food pantries in the area Referred the patient via [id number] platform to [employer] Discussed the opportunity for referral to [employer] site - patient declined referral at this time Determined the patient has contacted her daughter who is sending money to assist with purchasing food Discussed plans for SW to contact the patient within the next 10  days  Patient Goals/Self-Care Activities patient will:   -  Review provided food pantry list -Contact SW as needed prior to next scheduled call  Follow Up Plan:  SW will follow up with the patient over the next 10 days       Follow Up Plan: SW will follow up with patient by phone over the next week      Bevelyn Ngo, BSW, CDP Social Worker, Certified Dementia Practitioner [facility] Care Management [phone]

## 2020-09-28 ENCOUNTER — Other Ambulatory Visit: Payer: Self-pay

## 2020-09-28 ENCOUNTER — Ambulatory Visit: Payer: Medicare PPO

## 2020-09-28 ENCOUNTER — Ambulatory Visit: Payer: Medicare PPO | Admitting: Nurse Practitioner

## 2020-09-28 DIAGNOSIS — E782 Mixed hyperlipidemia: Secondary | ICD-10-CM

## 2020-09-28 DIAGNOSIS — I10 Essential (primary) hypertension: Secondary | ICD-10-CM

## 2020-09-28 DIAGNOSIS — E1165 Type 2 diabetes mellitus with hyperglycemia: Secondary | ICD-10-CM

## 2020-09-28 MED ORDER — MELOXICAM 7.5 MG PO TABS: 7.5000 mg | ORAL_TABLET | Freq: Every day | ORAL | 0 refills | Status: DC

## 2020-09-28 NOTE — Chronic Care Management (AMB) (Signed)
Chronic Care Management    Social Work Note  09/28/2020 Name: Gabriella Acosta MRN: [id number] DOB: [birth year]  Gabriella Acosta is a 75 y.o. year old female who is a primary care patient of [name], FNP. The CCM team was consulted to assist the patient with chronic disease management and/or care coordination needs related to: Food Insecurity.   Engaged with patient by telephone for follow up visit in response to provider referral for social work chronic care management and care coordination services.   Consent to Services:  The patient was given information about Chronic Care Management services, agreed to services, and gave verbal consent prior to initiation of services.  Please see initial visit note for detailed documentation.   Patient agreed to services and consent obtained.   Assessment: Review of patient past medical history, allergies, medications, and health status, including review of relevant consultants reports was performed today as part of a comprehensive evaluation and provision of chronic care management and care coordination services.     SDOH (Social Determinants of Health) assessments and interventions performed:    Advanced Directives Status: Not addressed in this encounter.  CCM Care Plan  No Known Allergies  Outpatient Encounter Medications as of 09/28/2020  Medication Sig Note   amLODipine (NORVASC) 5 MG tablet Take one tablet by mouth daily    atorvastatin (LIPITOR) 80 MG tablet TAKE ONE TABLET BY MOUTH BEFORE BREAKFAST    Blood Glucose Monitoring Suppl DEVI Check blood sugars twice daily. E11.9    Continuous Blood Gluc Sensor (FREESTYLE LIBRE 2 SENSOR) MISC Use to check blood sugars daily    gabapentin (NEURONTIN) 100 MG capsule Take 1 capsule (100 mg total) by mouth 3 (three) times daily. (Patient not taking: No sig reported)    Glucagon (GVOKE HYPOPEN 2-PACK) 0.5 MG/0.1ML SOAJ Inject 0.5 mg into the skin as needed. (Patient not taking: No sig  reported)    glucose blood (ACCU-CHEK GUIDE) test strip Use as instructed    icosapent Ethyl (VASCEPA) 1 g capsule Take 2 capsules (2 g total) by mouth 2 (two) times daily.    insulin aspart (FIASP FLEXTOUCH) 100 UNIT/ML FlexTouch Pen Inject 6-10 Units into the skin daily as needed (if  blood glucose is in 200 or above). Sliding scale  Use as directed per sliding scale three times daily    Insulin Glargine (BASAGLAR KWIKPEN) 100 UNIT/ML Inject 30 Units into the skin at bedtime. 07/23/2020: Took 15 units last night, 07/22/2020      Insulin Pen Needle (B-D UF III MINI PEN NEEDLES) 31G X 5 MM MISC USE AS DIRECTED DAILY    Lancets (ACCU-CHEK SOFT TOUCH) lancets Use as instructed    losartan (COZAAR) 50 MG tablet Take 1 tablet (50 mg total) by mouth daily.    magnesium 30 MG tablet Take 30 mg by mouth once a week.    meloxicam (MOBIC) 7.5 MG tablet Take 1 tablet (7.5 mg total) by mouth daily.    Multiple Vitamins-Minerals (MULTIVITAMIN WITH MINERALS) tablet Take 1 tablet by mouth daily.    niacin (NIASPAN) 500 MG CR tablet TAKE ONE TABLET BY MOUTH EVERYDAY AT BEDTIME    oxyCODONE-acetaminophen (PERCOCET) 5-325 MG tablet Take 1 tablet by mouth every 6 (six) hours as needed.    potassium chloride (KLOR-CON) 10 MEQ tablet Take 1 tablet (10 mEq total) by mouth daily.    sitaGLIPtin (JANUVIA) 100 MG tablet Take 1 tablet (100 mg total) by mouth daily.    No facility-administered encounter medications  on file as of 09/28/2020.    Patient Active Problem List   Diagnosis Date Noted   Tobacco abuse 07/22/2020   Type 2 diabetes mellitus (HCC) 07/22/2020   Cognitive deficits 11/07/2018   ETOH abuse 07/30/2013   Anxiety 07/30/2013   Essential hypertension 01/31/2011    Conditions to be addressed/monitored: HTN and DMII; Limited access to food  Care Plan : Social Work [facility] Care Plan  Updates made by Bevelyn Ngo since 09/28/2020 12:00 AM  Completed 09/28/2020   Problem: Healthy Nutrition  (Wellness) Resolved 09/28/2020     Goal: Healthy Nutrition Achieved Completed 09/28/2020  Start Date: 09/23/2020  Expected End Date: 10/23/2020  Recent Progress: On track  Priority: High  Note:   Current Barriers:  Chronic disease management support and education needs related to HTN, DM, and Mixed Hyperlipidemia   Limited access to food  Social Worker Clinical Goal(s):  patient will work with SW to identify and address any acute and/or chronic care coordination needs related to the self health management of HTN, DM, and Mixed Hyperlipidemia   explore community resource options for unmet needs related GY:JEHU: Food insecurities patient is unable to afford groceries   SW Interventions:  Inter-disciplinary care team collaboration (see longitudinal plan of care) Collaboration with [name], FNP regarding development and update of comprehensive plan of care as evidenced by provider attestation and co-signature Successful outbound call placed to the patient to assess goal progression Confirmed receipt of provided food pantry list Discussed the patient has not follow up with any food pantries due to her car being in the shop Patient reports two of her friend have been assisting with food needs Assessed for other SDoH needs - patient denies acute needs at this time Encouraged the patient to contact SW as needed  Patient Goals/Self-Care Activities patient will:   -  Review provided food pantry list -Contact SW as needed         Follow Up Plan:  No SW follow up planned at this time. Patient will remain engaged with RN Care Manager      Bevelyn Ngo, BSW, CDP Social Worker, Certified Dementia Practitioner [facility] Care Management [phone]

## 2020-09-28 NOTE — Patient Instructions (Signed)
Social Worker Visit Information  Goals we discussed today:   Goals Addressed             This Visit's Progress    COMPLETED: [place] Nutrition Received (SW)       Timeframe:  Short-Term Goal Priority:  High Start Date:  9.15.22                           Expected End Date: 10.15.22                       Patient Goals/Self-Care Activities patient will:   - Review provided food pantry list -Contact SW as needed          Patient verbalizes understanding of instructions provided today and agrees to view in [place].   Follow Up Plan:  No follow up planned at this time. Please contact me as needed.   Bevelyn Ngo, BSW, CDP Social Worker, Certified Dementia Practitioner [facility] Care Management [phone]

## 2020-09-28 NOTE — Patient Instructions (Signed)
Visit Information  PATIENT GOALS:  Goals Addressed       Patient Stated     Glycemic Management Optimized (pt-stated)   Not on track     Timeframe:  Long-Range Goal Priority:  High Start Date: 02/09/20                            Expected End Date: 02/08/21  Follow up date: 10/08/20  Patient Self-Care Activities:  Monitor blood sugars before meals and at bedtime  Take DM medications exactly as prescribed  Adhere to ADA diet as discussed  Attend all scheduled provider appointments Call pharmacy for medication refills Call provider office for new concerns or questions            Other     Abnormal Weight Loss - evaluated and treated        Timeframe:  Long-Range Goal Priority:  High Start Date:  06/14/20                           Expected End Date: 12/14/20  Next Scheduled Follow up date: 10/08/20             Self Care Activities:  Continue to keep all scheduled follow up appointments Take medications as directed  Let your healthcare team know if you are unable to take your medications Call your pharmacy for refills at least 7 days prior to running out of medication Keep a food journal of all foods, snacks and liquids to review at next [facility] follow up  Patient Goals: - to achieve/maintain adequate weight per BMI weight range                 Acute Pain - treatment optimized        Timeframe:  Short-Term Goal Priority:  High Start Date:  06/24/20                           Expected End Date:  09/24/20  Next Scheduled Follow up date: 10/08/20      Self Care Activities:  Self administers medications as prescribed Attends all scheduled provider appointments Calls pharmacy for medication refills Calls provider office for new concerns or questions Patient Goals: - follow MD recommendations for management of acute pain                      Fatty tumor - Surgical Treatment successful        Timeframe:  Short-Term Goal Priority:  High Start Date:  06/14/20                            Expected End Date: 10/15/20     Next Scheduled Follow up call: 10/08/20      Self Care Activities:  Continue to keep all scheduled follow up appointments Take medications as directed  Let your healthcare team know if you are unable to take your medications Call your pharmacy for refills at least 7 days prior to running out of medication Follow up with general sugery as scheduled on 07/01/20 for surgical removal of fatty tumor Patient Goals: - to recover from surgical removal of a fatty tumor without complications                   Hypertriglyceridemia - complicationa minimzed or prevented  Timeframe:  Long-Range Goal Priority:  High Start Date:  06/14/20                           Expected End Date:  06/14/21   Next Scheduled follow up date: 10/08/20          Self Care Activities:  Continue to keep all scheduled follow up appointments Take medications as directed  Let your healthcare team know if you are unable to take your medications Call your pharmacy for refills at least 7 days prior to running out of medication Follow up with GI Specialist for evaluation and treatment of Pancreatic stones Adhere to dietary and exercise recommendations as discussed  Patient Goals: - follow up with GI for evaluation of pancreatic stones                 Medication Adherence Maintained        Timeframe:  Long-Range Goal Priority:  High Start Date:  07/29/20                           Expected End Date: 01/28/21  Next Scheduled follow up: 10/08/20  Self Care Activities:  Self administers medications as prescribed Attends all scheduled provider appointments Calls pharmacy for medication refills Calls provider office for new concerns or questions Patient Goals: - maintain medication adherence                            Smoking Cessation - treatment optimized        Timeframe:  Long-Range Goal Priority:  High Start Date:  06/14/20                           Expected End Date: 06/14/21  Next  Scheduled Follow up date: 10/08/20             Self Care Activities:  Continue to keep all scheduled follow up appointments Take medications as directed  Let your healthcare team know if you are unable to take your medications Call your pharmacy for refills at least 7 days prior to running out of medication Patient Goals: - work with embedded Pharm D for treatment options to help quit smoking - develop a plan to quit smoking - quit smoking                   The patient verbalized understanding of instructions, educational materials, and care plan provided today and declined offer to receive copy of patient instructions, educational materials, and care plan.   Telephone follow up appointment with care management team member scheduled for: 10/08/20  Delsa Sale, RN, BSN, CCM Care Management Coordinator [facility] Care Management/[facility]  Direct Phone: [phone]

## 2020-09-28 NOTE — Chronic Care Management (AMB) (Signed)
Chronic Care Management   CCM RN Visit Note  09/23/2020 Name: Gabriella Acosta MRN: [id number] DOB: 03/23/1945  Subjective: Gabriella Acosta is a 75 y.o. year old female who is a primary care patient of Moore, [name], FNP. The care management team was consulted for assistance with disease management and care coordination needs.    Engaged with patient by telephone for follow up visit in response to provider referral for case management and/or care coordination services.   Consent to Services:  The patient was given information about Chronic Care Management services, agreed to services, and gave verbal consent prior to initiation of services.  Please see initial visit note for detailed documentation.   Patient agreed to services and verbal consent obtained.   Assessment: Review of patient past medical history, allergies, medications, health status, including review of consultants reports, laboratory and other test data, was performed as part of comprehensive evaluation and provision of chronic care management services.   SDOH (Social Determinants of Health) assessments and interventions performed:  Yes, resources needed for food  CCM Care Plan  No Known Allergies  Outpatient Encounter Medications as of 09/23/2020  Medication Sig Note   amLODipine (NORVASC) 5 MG tablet Take one tablet by mouth daily    atorvastatin (LIPITOR) 80 MG tablet TAKE ONE TABLET BY MOUTH BEFORE BREAKFAST    Blood Glucose Monitoring Suppl DEVI Check blood sugars twice daily. E11.9    Continuous Blood Gluc Sensor (FREESTYLE LIBRE 2 SENSOR) MISC Use to check blood sugars daily    gabapentin (NEURONTIN) 100 MG capsule Take 1 capsule (100 mg total) by mouth 3 (three) times daily. (Patient not taking: No sig reported)    Glucagon (GVOKE HYPOPEN 2-PACK) 0.5 MG/0.1ML SOAJ Inject 0.5 mg into the skin as needed. (Patient not taking: No sig reported)    glucose blood (ACCU-CHEK GUIDE) test strip Use as instructed     icosapent Ethyl (VASCEPA) 1 g capsule Take 2 capsules (2 g total) by mouth 2 (two) times daily.    insulin aspart (FIASP FLEXTOUCH) 100 UNIT/ML FlexTouch Pen Inject 6-10 Units into the skin daily as needed (if  blood glucose is in 200 or above). Sliding scale  Use as directed per sliding scale three times daily    Insulin Glargine (BASAGLAR KWIKPEN) 100 UNIT/ML Inject 30 Units into the skin at bedtime. 07/23/2020: Took 15 units last night, 07/22/2020      Insulin Pen Needle (B-D UF III MINI PEN NEEDLES) 31G X 5 MM MISC USE AS DIRECTED DAILY    Lancets (ACCU-CHEK SOFT TOUCH) lancets Use as instructed    losartan (COZAAR) 50 MG tablet Take 1 tablet (50 mg total) by mouth daily.    magnesium 30 MG tablet Take 30 mg by mouth once a week.    Multiple Vitamins-Minerals (MULTIVITAMIN WITH MINERALS) tablet Take 1 tablet by mouth daily.    niacin (NIASPAN) 500 MG CR tablet TAKE ONE TABLET BY MOUTH EVERYDAY AT BEDTIME    oxyCODONE-acetaminophen (PERCOCET) 5-325 MG tablet Take 1 tablet by mouth every 6 (six) hours as needed.    potassium chloride (KLOR-CON) 10 MEQ tablet Take 1 tablet (10 mEq total) by mouth daily.    sitaGLIPtin (JANUVIA) 100 MG tablet Take 1 tablet (100 mg total) by mouth daily.    No facility-administered encounter medications on file as of 09/23/2020.    Patient Active Problem List   Diagnosis Date Noted   Tobacco abuse 07/22/2020   Type 2 diabetes mellitus (HCC) 07/22/2020     Cognitive deficits 11/07/2018   ETOH abuse 07/30/2013   Anxiety 07/30/2013   Essential hypertension 01/31/2011    Conditions to be addressed/monitored: DM II, HTN, Mixed hyperlipidemia, Tobacco Use   Care Plan : Diabetes Type 2 (Adult)  Updates made by Little, Angel L, RN since 09/23/2020 12:00 AM     Problem: Glycemic Management (Diabetes, Type 2)   Priority: High     Long-Range Goal: Glycemic Management Optimized   Start Date: 02/09/2020  Expected End Date: 02/08/2021  Recent Progress: On track   Priority: High  Note:   Objective:  Lab Results  Component Value Date   HGBA1C 14.1 (H) 03/25/2020   Lab Results  Component Value Date   CREATININE 0.86 05/26/2020   CREATININE 0.67 03/25/2020   CREATININE 0.67 10/20/2019   Lab Results  Component Value Date   EGFR 92 03/25/2020  Current Barriers:  Knowledge Deficits related to disease process and Self Health Management of Diabetes Chronic Disease Management support and education needs related to type 2 Diabetes, HTN, Hyperlipidemia Nurse Case Manager Clinical Goal(s):  Patient will work with CCM team and PCP to address needs related to disease education and support for improved Self Health management of DM CCM RN CM Interventions:  09/23/20 completed successful outbound call with patient  Inter-disciplinary care team collaboration (see longitudinal plan of care) Evaluation of current treatment plan related to Diabetes and patient's adherence to plan as established by provider. Provided education to patient about basic DM disease process Review of patient status, including review of consultant's reports, relevant laboratory and other test results, and medications completed. Reviewed medications with patient and discussed importance of medication adherence Educated patient re: current A1c has increased to 14.1%; Educated on target A1c 7.5; Educated on dietary and exercise recommendations; Educated on daily glycemic control FBS 80-130; <180 after meals; Educated on 15'15' rule  Determined patient now has the Libre Scanner and is monitoring and recording her CBG's 3-4 times daily, average readings are above target range, states running in the 200's Assisted patient with scheduled provider appointment with PCP for DM follow up scheduled for: 09/28/20 @4:30 PM  Reinforced the importance of keeping all scheduled PCP f/u appointments  Advised patient, providing education and rationale, to check cbg 2-3 daily before meals and record, calling  the CCM team and PCP for findings outside established parameters Determined patient is having financial difficulty and reports she does not have enough food in her house, she is unable to afford to buy groceries Discussed having the embedded BSW Kendra Humble contact her ASAP to offer assistance, patient agreed Collaborated with Kendra requesting she outreach to Ms. Delman ASAP to offer resources for food Mailed printed educational materials related to Low Carb Smoothies: Mediterranean diet; Carb Choice list  Discussed plans with patient for ongoing care management follow up and provided patient with direct contact information for care management team Patient Goals/Self Care Activities:  Monitor blood sugars as directed  Take DM medications exactly as prescribed  Adhere to ADA diet as discussed  Attend all scheduled provider appointments Call pharmacy for medication refills Call provider office for new concerns or questions  Follow up Plan: RNCM will follow up with patient by phone on or around: 10/08/20    Care Plan : General Plan of Care (Adult)  Updates made by Little, Angel L, RN since 09/23/2020 12:00 AM     Problem: 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   Priority: High     Goal: Fatty Tumor - Surgical   Treatment successful   Start Date: 06/14/2020  Expected End Date: 10/15/2020  Recent Progress: On track  Priority: High  Note:   Current Barriers:  Ineffective Self Health Maintenance  Clinical Goal(s):  Collaboration with Moore, [name], FNP regarding development and update of comprehensive plan of care as evidenced by provider attestation and co-signature Inter-disciplinary care team collaboration (see longitudinal plan of care) patient will work with care management team to address care coordination and chronic disease management needs related to Disease Management Educational Needs Care Coordination Medication Management and Education Psychosocial Support   Interventions:   06/14/20 completed successful outbound call with patient  Evaluation of current treatment plan related to  DM II, HTN, Mixed hyperlipidemia , self-management and patient's adherence to plan as established by provider. 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 imaging Reviewed and discussed patient was referred to [facility] for further evaluation  Determined patient consulted with general surgery today and is scheduled to have the newly diagnosed tumor removed via outpatient surgery on 07/01/20 Discussed plans with patient for ongoing care management follow up and provided patient with direct contact information for care management team Self Care Activities:  Continue to keep all scheduled follow up appointments Take medications as directed  Let your healthcare team know if you are unable to take your medications Call your pharmacy for refills at least 7 days prior to running out of medication Follow up with general sugery as scheduled on 07/01/20 for surgical removal of fatty tumor Patient Goals: - to recover from surgical removal of a fatty tumor without complications    Follow Up Plan: Telephone follow up appointment with care management team member scheduled for: 10/08/20    Care Plan : Abnormal Weight Loss  Updates made by Little, Angel L, RN since 09/23/2020 12:00 AM     Problem: Abnormal Weight Loss   Priority: High     Long-Range Goal: Abnormal Weight Loss - evaluated and treated   Start Date: 06/14/2020  Expected End Date: 12/14/2020  Recent Progress: On track  Priority: High  Note:    WEIGHTS:    Wt Readings from Last 3 Encounters:  05/10/20 123 lb 9.6 oz (56.1 kg)  05/05/20 124 lb (56.2 kg)  03/25/20 124 lb 9.6 oz (56.5 kg)  Current Barriers:  Ineffective Self Health Maintenance  Clinical Goal(s):  Collaboration with Moore, [name], FNP regarding development and update of comprehensive plan of care as evidenced by provider attestation and co-signature Inter-disciplinary care team collaboration (see longitudinal plan of care) patient will work with care management team to address care coordination and chronic disease management needs related to Disease Management Educational Needs Care Coordination Medication Management and Education Psychosocial Support   Interventions:  06/14/20 completed successful outbound call with patient  Evaluation of current treatment plan related to  Abnormal weight loss , self-management and patient's adherence to plan as established by provider. Collaboration with Moore, [name], FNP regarding development and update of comprehensive plan of care as evidenced by provider attestation       and co-signature Inter-disciplinary care team collaboration (see longitudinal plan of care) Determined patient reports having unintentional weight   loss with current weight noted to be 118 lbs  Determined patient reported her concerns to PCP provider and continues to self monitor her weight Educated patient providing rationale for potential weight loss that can occur from uncontrolled diabetes  Encouraged patient to keep a food journal starting with her first meal of the day and to include all liquids and snacks for review at next RNCM follow up call  Discussed plans with patient for ongoing care management follow up and provided patient with direct contact information for care management team Self Care Activities:  Continue to keep all scheduled follow up appointments Take medications as directed  Let your healthcare team know if you are unable to take your  medications Call your pharmacy for refills at least 7 days prior to running out of medication Keep a food journal of all foods, snacks and liquids to review at next RNCM follow up  Patient Goals: - to achieve/maintain adequate weight per BMI weight range   Follow Up Plan: Telephone follow up appointment with care management team member scheduled for: 10/08/20     Care Plan : Hypertriglyceridemia  Updates made by Little, Angel L, RN since 09/23/2020 12:00 AM     Problem: Hypertriglyceridemia   Priority: High     Long-Range Goal: Hypertriglyceridemia - complications minimized or avoided   Start Date: 06/14/2020  Expected End Date: 06/14/2021  Recent Progress: On track  Priority: High  Note:   Current Barriers:  Ineffective Self Health Maintenance  Clinical Goal(s):  Collaboration with Moore, [name], FNP regarding development and update of comprehensive plan of care as evidenced by provider attestation and co-signature Inter-disciplinary care team collaboration (see longitudinal plan of care) patient will work with care management team to address care coordination and chronic disease management needs related to Disease Management Educational Needs Care Coordination Medication Management and Education Psychosocial Support   Interventions:  06/14/20 successful outbound call completed with patient  Evaluation of current treatment plan related to  Mixed Hyperlipidemia , self-management and patient's adherence to plan as established by provider. Collaboration with Moore, [name], FNP regarding development and update of comprehensive plan of care as evidenced by provider attestation       and co-signature Inter-disciplinary care team collaboration (see longitudinal plan of care) Provided education to patient about basic disease process related to Hypertriglyceridemia  Review of patient status, including review of consultant's reports, relevant laboratory and other test results, and medications  completed. Reviewed medications with patient and discussed importance of medication adherence Educated on dietary and exercise recommendations  Determined patient was diagnosed with Pancreatic stones and will follow up with a Surgeon for evaluation and treatment  Educated patient on potential complication including pancreatic involvement secondary to chronic hypertriglyceridemia  Mailed printed educational material related to My Cholesterol Guide; What is Hypertriglyceridemia  Discussed plans with patient for ongoing care management follow up and provided patient with direct contact information for care management team Self Care Activities:  Continue to keep all scheduled follow up appointments Take medications as directed  Let your healthcare team know if you are unable to take your medications Call your pharmacy for refills at least 7 days prior to running out of medication Follow up with GI Specialist for evaluation and treatment of Pancreatic stones Adhere to dietary and exercise recommendations as discussed  Patient Goals: - follow up with GI for evaluation of pancreatic stones   Follow Up Plan: Telephone follow up appointment with care management team member scheduled for: 10/08/20       Care Plan : Smoking Cessation  Updates made by Little, Angel L, RN since 09/23/2020 12:00 AM     Problem: Smoking Cessation   Priority: High     Long-Range Goal: Smoking Cessation - treatment optimized   Start Date: 06/14/2020  Expected End Date: 06/14/2021  Recent Progress: On track  Priority: High  Note:   Current Barriers:  Ineffective Self Health Maintenance  Clinical Goal(s):  Collaboration with Moore, [name], FNP regarding development and update of comprehensive plan of care as evidenced by provider attestation and co-signature Inter-disciplinary care team collaboration (see longitudinal plan of care) patient will work with care management team to address care coordination and chronic  disease management needs related to Disease Management Educational Needs Care Coordination Psychosocial Support   Interventions:  06/14/20 completed successful outbound call with patient  Evaluation of current treatment plan related to DM II, HTN, Mixed hyperlipidemia, self-management and patient's adherence to plan as established by provider. Collaboration with Moore, [name], FNP regarding development and update of comprehensive plan of care as evidenced by provider attestation       and co-signature Inter-disciplinary care team collaboration (see longitudinal plan of care) Determined patient would like to quit smoking; she has been smoking x 25 years but recently cut back to 3 cigarettes per day  Determined patient would like to work with embedded Pharm D for pharmacological options for treatment cessation  Referral sent to embedded Pharm D requesting assistance with smoking cessation per patient request marked normal routine  Discussed plans with patient for ongoing care management follow up and provided patient with direct contact information for care management team Self Care Activities:  Continue to keep all scheduled follow up appointments Take medications as directed  Let your healthcare team know if you are unable to take your medications Call your pharmacy for refills at least 7 days prior to running out of medication Patient Goals: - work with embedded Pharm D for treatment options to help quit smoking - develop a plan to quit smoking - quit smoking   Follow Up Plan: Telephone follow up appointment with care management team member scheduled for: 10/08/20     Care Plan : Acute Pain (Adult)  Updates made by Little, Angel L, RN since 09/23/2020 12:00 AM     Problem: Pain Management (Acute Pain)   Priority: High     Goal: Acute Pain Management - treatment optimized   Start Date: 06/24/2020  Expected End Date: 09/24/2020  Recent Progress: On track  Priority: High  Note:   D  Current Barriers:  Ineffective Self Health Maintenance  Clinical Goal(s):  Collaboration with Moore, [name], FNP regarding development and update of comprehensive plan of care as evidenced by provider attestation and co-signature Inter-disciplinary care team collaboration (see longitudinal plan of care) patient will work with care management team to address care coordination and chronic disease management needs related to Disease Management Educational Needs Care Coordination Medication Management and Education Psychosocial Support   Interventions:  06/24/20 inbound call from patient/Care Coordination  Evaluation of current treatment plan related to  DM II, HTN, Mixed hyperlipidemia , self-management and patient's adherence to plan as established by provider. Collaboration with Moore, [name], FNP regarding development and update of comprehensive plan of care as evidenced by provider attestation       and co-signature Inter-disciplinary care team collaboration (see longitudinal plan of care) Received inbound call from patient stating she has been unable to reach the PCP office in order to fill her pain med   Rx, she is requesting assistance Collaborated with PCP [name] FNP regarding patient's request, [name] stated the office has been in contact with [name] and sent in a refill for her pain medication  Discussed plans with patient for ongoing care management follow up and provided patient with direct contact information for care management team Self Care Activities:  Self administers medications as prescribed Attends all scheduled provider appointments Calls pharmacy for medication refills Calls provider office for new concerns or questions Patient Goals: - follow MD recommendations for management of acute pain  Follow Up Plan: Telephone follow up appointment with care management team member scheduled for: 10/08/20     Care Plan : Medication Adhernece  Updates made by [name], RN since 09/23/2020 12:00 AM     Problem: Medication Adherence   Priority: High     Long-Range Goal: Medication Adherence maintained   Start Date: 07/29/2020  Expected End Date: 01/28/2021  Recent Progress: On track  Priority: High  Note:   Current Barriers:  Ineffective Self Health Maintenance in a patient with DM II, HTN, Mixed hyperlipidemia, Tobacco Use  Clinical Goal(s):  Collaboration with [name], FNP regarding development and update of comprehensive plan of care as evidenced by provider attestation and co-signature Inter-disciplinary care team collaboration (see longitudinal plan of care) patient will work with care management team to address care coordination and chronic disease management needs related to Disease Management Educational Needs Care Coordination Medication Management and Education Medication Reconciliation Psychosocial Support   Interventions:  07/29/20 completed successful inbound call with patient  Evaluation of current treatment plan related to DM II, HTN, Mixed hyperlipidemia, Tobacco Use ,  self-management and patient's adherence to plan as established by provider. Collaboration with [name], FNP regarding development and update of comprehensive plan of care as evidenced by provider attestation       and co-signature Inter-disciplinary care team collaboration (see longitudinal plan of care) Determined patient has several questions regarding her prescribed medication regimen Reviewed medications with patient and discussed importance of medication adherence, answered patient questions and confirmed patient has a better understanding of her prescribed medication regimen  Discussed plans with patient for ongoing care management follow up and provided patient with direct contact information for care management team Self Care Activities:  Self administers medications as prescribed Attends all scheduled provider appointments Calls pharmacy  for medication refills Calls provider office for new concerns or questions Patient Goals: - maintain medication adherence  Follow Up Plan: Telephone follow up appointment with care management team member scheduled for: 10/08/20     Plan:Telephone follow up appointment with care management team member scheduled for:  10/08/20  Barb Merino, RN, BSN, CCM Care Management Coordinator [facility] [facility]  Direct Phone: [phone]

## 2020-10-04 ENCOUNTER — Telehealth: Payer: Self-pay | Admitting: Nurse Practitioner

## 2020-10-04 NOTE — Telephone Encounter (Signed)
   Telephone encounter was:  Successful.  10/04/2020 Name: Gabriella Acosta MRN: [id number] DOB: Jul 01, 1945  Gabriella Acosta is a 76 y.o. year old female who is a primary care patient of [name], FNP . The community resource team was consulted for assistance with Food Insecurity and Financial Difficulties related to paying utilities.  Care guide performed the following interventions: Patient provided with information about care guide support team and interviewed to confirm resource needs  Emailed pt list of local food banks and financial resources for [facility].  Follow Up Plan:  No further follow up planned at this time. The patient has been provided with needed resources.  April Green Care Guide, Embedded Care Coordination [facility], Care Management Phone: [phone] Email: [email or web address]

## 2020-10-08 ENCOUNTER — Ambulatory Visit: Payer: Self-pay

## 2020-10-08 ENCOUNTER — Telehealth: Payer: Medicare PPO

## 2020-10-08 DIAGNOSIS — I10 Essential (primary) hypertension: Secondary | ICD-10-CM

## 2020-10-08 DIAGNOSIS — E782 Mixed hyperlipidemia: Secondary | ICD-10-CM | POA: Diagnosis not present

## 2020-10-08 DIAGNOSIS — E1165 Type 2 diabetes mellitus with hyperglycemia: Secondary | ICD-10-CM

## 2020-10-08 DIAGNOSIS — Z720 Tobacco use: Secondary | ICD-10-CM

## 2020-10-08 NOTE — Chronic Care Management (AMB) (Signed)
Chronic Care Management   CCM RN Visit Note  10/08/2020 Name: Gabriella Acosta MRN: [id number] DOB: 1945-04-21  Subjective: Gabriella Acosta is a 75 y.o. year old female who is a primary care patient of [name], [place]. The care management team was consulted for assistance with disease management and care coordination needs.    Engaged with patient by telephone for follow up visit in response to provider referral for case management and/or care coordination services.   Consent to Services:  The patient was given information about Chronic Care Management services, agreed to services, and gave verbal consent prior to initiation of services.  Please see initial visit note for detailed documentation.   Patient agreed to services and verbal consent obtained.   Assessment: Review of patient past medical history, allergies, medications, health status, including review of consultants reports, laboratory and other test data, was performed as part of comprehensive evaluation and provision of chronic care management services.   SDOH (Social Determinants of Health) assessments and interventions performed:  Yes, no acute needs   CCM Care Plan  No Known Allergies  Outpatient Encounter Medications as of 10/08/2020  Medication Sig Note   amLODipine (NORVASC) 5 MG tablet Take one tablet by mouth daily    atorvastatin (LIPITOR) 80 MG tablet TAKE ONE TABLET BY MOUTH BEFORE BREAKFAST    Blood Glucose Monitoring Suppl DEVI Check blood sugars twice daily. E11.9    Continuous Blood Gluc Sensor (FREESTYLE LIBRE 2 SENSOR) MISC Use to check blood sugars daily    gabapentin (NEURONTIN) 100 MG capsule Take 1 capsule (100 mg total) by mouth 3 (three) times daily. (Patient not taking: No sig reported)    Glucagon (GVOKE HYPOPEN 2-PACK) 0.5 MG/0.1ML SOAJ Inject 0.5 mg into the skin as needed. (Patient not taking: No sig reported)    glucose blood (ACCU-CHEK GUIDE) test strip Use as instructed    icosapent  Ethyl (VASCEPA) 1 g capsule Take 2 capsules (2 g total) by mouth 2 (two) times daily.    insulin aspart (FIASP FLEXTOUCH) 100 UNIT/ML FlexTouch Pen Inject 6-10 Units into the skin daily as needed (if  blood glucose is in 200 or above). Sliding scale  Use as directed per sliding scale three times daily    Insulin Glargine (BASAGLAR KWIKPEN) 100 UNIT/ML Inject 30 Units into the skin at bedtime. 07/23/2020: Took 15 units last night, 07/22/2020      Insulin Pen Needle (B-D UF III MINI PEN NEEDLES) 31G X 5 MM MISC USE AS DIRECTED DAILY    Lancets (ACCU-CHEK SOFT TOUCH) lancets Use as instructed    losartan (COZAAR) 50 MG tablet Take 1 tablet (50 mg total) by mouth daily.    magnesium 30 MG tablet Take 30 mg by mouth once a week.    meloxicam (MOBIC) 7.5 MG tablet Take 1 tablet (7.5 mg total) by mouth daily.    Multiple Vitamins-Minerals (MULTIVITAMIN WITH MINERALS) tablet Take 1 tablet by mouth daily.    niacin (NIASPAN) 500 MG CR tablet TAKE ONE TABLET BY MOUTH EVERYDAY AT BEDTIME    oxyCODONE-acetaminophen (PERCOCET) 5-325 MG tablet Take 1 tablet by mouth every 6 (six) hours as needed.    potassium chloride (KLOR-CON) 10 MEQ tablet Take 1 tablet (10 mEq total) by mouth daily.    sitaGLIPtin (JANUVIA) 100 MG tablet Take 1 tablet (100 mg total) by mouth daily.    No facility-administered encounter medications on file as of 10/08/2020.    Patient Active Problem List   Diagnosis  Date Noted   Tobacco abuse 07/22/2020   Type 2 diabetes mellitus ([place]) 07/22/2020   Cognitive deficits 11/07/2018   ETOH abuse 07/30/2013   Anxiety 07/30/2013   Essential hypertension 01/31/2011    Conditions to be addressed/monitored: DM II, HTN, Mixed hyperlipidemia, Tobacco Use   Care Plan : Diabetes Type 2 (Adult)  Updates made by [name], RN since 10/08/2020 12:00 AM     Problem: Glycemic Management (Diabetes, Type 2)   Priority: High     Long-Range Goal: Glycemic Management Optimized   Start  Date: 02/09/2020  Expected End Date: 02/08/2021  Recent Progress: On track  Priority: High  Note:   Objective:  Lab Results  Component Value Date   HGBA1C 14.1 (H) 03/25/2020   Lab Results  Component Value Date   CREATININE 0.86 05/26/2020   CREATININE 0.67 03/25/2020   CREATININE 0.67 10/20/2019   Lab Results  Component Value Date   EGFR 92 03/25/2020  Current Barriers:  Knowledge Deficits related to disease process and Self Health Management of Diabetes Chronic Disease Management support and education needs related to type 2 Diabetes, HTN, Hyperlipidemia Nurse Case Manager Clinical Goal(s):  Patient will work with CCM team and PCP to address needs related to disease education and support for improved Self Health management of DM CCM RN CM Interventions:  10/08/20 completed successful outbound call with patient  Inter-disciplinary care team collaboration (see longitudinal plan of care) Evaluation of current treatment plan related to Diabetes and patient's adherence to plan as established by provider. Provided education to patient about basic DM disease process including potential complications for uncontrolled DM  Review of patient status, including review of consultant's reports, relevant laboratory and other test results, and medications completed. Reviewed medications with patient and discussed importance of medication adherence Educated patient re: current A1c has increased to 14.1%; Educated on target A1c 7.5; Educated on dietary and exercise recommendations; Educated on daily glycemic control FBS 80-130; <180 after meals; Educated on 15'15' rule  Determined patient now has the [employer] and is monitoring and recording her CBG's 3-4 times daily, average readings are above target range, states running in the 200's by lunch Advised patient, providing education and rationale, to check cbg 2-3 daily before meals and at bedtime and record, calling the CCM team and PCP for findings  outside established parameters Determined patient missed new patient appointment with Endocrinologist Dr. Kelton Pillar on 08/25/20 Provided patient with the contact number and address for Dr. Kelton Pillar and instructed patient to call to reschedule this new patient appointment ASAP, patient aware this provider has indicated she will not be able to reschedule if no show or next appointment is cancelled, patient verbalizes understanding and is agreeable to reschedule  Mailed printed educational materials related to Meal Planning, Preventing Complications from Diabetes; Diabetes Care Schedule; Hypo/Hyperglycemia Zone Safety Tool  Discussed plans with patient for ongoing care management follow up and provided patient with direct contact information for care management team Patient Self Care Activities:  Monitor blood sugars as directed  Take DM medications exactly as prescribed  Adhere to ADA diet as discussed  Attend all scheduled provider appointments Call pharmacy for medication refills Call provider office for new concerns or questions Patient Goals:  - call Dr. Kelton Pillar today to reschedule new patient appointment   Follow up Plan: RNCM will follow up with patient by phone on or around: 10/08/20    Plan:Telephone follow up appointment with care management team member scheduled for:  12/13/20  [facility]  Sharvil Hoey, RN, BSN, CCM Care Management Coordinator [facility]  Direct Phone: [phone]

## 2020-10-08 NOTE — Patient Instructions (Signed)
Visit Information  PATIENT GOALS:  Goals Addressed       Patient Stated     Glycemic Management Optimized (pt-stated)   On track     Timeframe:  Long-Range Goal Priority:  High Start Date: 02/09/20                            Expected End Date: 02/08/21  Follow up date: 12/13/20  Patient Self-Care Activities:  Monitor blood sugars before meals and at bedtime  Take DM medications exactly as prescribed  Adhere to ADA diet as discussed  Attend all scheduled provider appointments Call pharmacy for medication refills Call provider office for new concerns or questions    Patient Goals:  - call Dr. Lonzo Cloud today to reschedule new patient appointment          Other     Abnormal Weight Loss - evaluated and treated        Timeframe:  Long-Range Goal Priority:  High Start Date:  06/14/20                           Expected End Date: 12/14/20  Follow up date: 12/13/20             Self Care Activities:  Continue to keep all scheduled follow up appointments Take medications as directed  Let your healthcare team know if you are unable to take your medications Call your pharmacy for refills at least 7 days prior to running out of medication Keep a food journal of all foods, snacks and liquids to review at next [facility] follow up  Patient Goals: - to achieve/maintain adequate weight per BMI weight range                 Acute Pain - treatment optimized        Timeframe:  Short-Term Goal Priority:  High Start Date:  06/24/20                           Expected End Date:  12/14/20  Follow up date: 12/13/20      Self Care Activities:  Self administers medications as prescribed Attends all scheduled provider appointments Calls pharmacy for medication refills Calls provider office for new concerns or questions Patient Goals: - follow MD recommendations for management of acute pain                      Fatty tumor - Surgical Treatment successful        Timeframe:  Short-Term Goal Priority:   High Start Date:  06/14/20                           Expected End Date: 12/14/20     Next Scheduled Follow up call: 12/13/20      Self Care Activities:  Continue to keep all scheduled follow up appointments Take medications as directed  Let your healthcare team know if you are unable to take your medications Call your pharmacy for refills at least 7 days prior to running out of medication Follow up with general sugery as scheduled on 07/01/20 for surgical removal of fatty tumor Patient Goals: - to recover from surgical removal of a fatty tumor without complications  Hypertriglyceridemia - complicationa minimzed or prevented        Timeframe:  Long-Range Goal Priority:  High Start Date:  06/14/20                           Expected End Date:  06/14/21  Follow up date: 12/13/20          Self Care Activities:  Continue to keep all scheduled follow up appointments Take medications as directed  Let your healthcare team know if you are unable to take your medications Call your pharmacy for refills at least 7 days prior to running out of medication Follow up with GI Specialist for evaluation and treatment of Pancreatic stones Adhere to dietary and exercise recommendations as discussed  Patient Goals: - follow up with GI for evaluation of pancreatic stones                 Medication Adherence Maintained        Timeframe:  Long-Range Goal Priority:  High Start Date:  07/29/20                           Expected End Date: 01/28/21  Follow up date: 12/13/20  Self Care Activities:  Self administers medications as prescribed Attends all scheduled provider appointments Calls pharmacy for medication refills Calls provider office for new concerns or questions Patient Goals: - maintain medication adherence                            COMPLETED: Smoking Cessation - treatment optimized        Timeframe:  Long-Range Goal Priority:  High Start Date:  06/14/20                            Expected End Date: 06/14/21  Next Scheduled Follow up date: 10/08/20             Self Care Activities:  Continue to keep all scheduled follow up appointments Take medications as directed  Let your healthcare team know if you are unable to take your medications Call your pharmacy for refills at least 7 days prior to running out of medication Patient Goals: - work with embedded Pharm D for treatment options to help quit smoking - develop a plan to quit smoking - quit smoking                   The patient verbalized understanding of instructions, educational materials, and care plan provided today and declined offer to receive copy of patient instructions, educational materials, and care plan.   Telephone follow up appointment with care management team member scheduled for: 12/13/20  Delsa Sale, RN, BSN, CCM Care Management Coordinator [facility]  Direct Phone: [phone]

## 2020-10-13 ENCOUNTER — Telehealth: Payer: Self-pay

## 2020-10-13 NOTE — Chronic Care Management (AMB) (Signed)
  Gabriella Acosta was reminded to have all medications, supplements and any blood glucose and blood pressure readings available for review with [name], Pharm. D, at her telephone visit on 10-14-2020 at 3:45.   Questions: Have you had any recent office visit or specialist visit outside of [facility]? Patient stated no  Are there any concerns you would like to discuss during your office visit? Patient stated she is concerned about the amount of weight she has loss and she stopped taking one of her medications. Patient was at work at the time so was unable to provide medication name.  Are you having any problems obtaining your medications? (Whether it pharmacy issues or cost) Patient stated no  If patient has any PAP medications ask if they are having any problems getting their PAP medication or refill? No PAP medications  Care Gaps: Shingrix overdue Yearly foot exam overdue Colonoscopy overdue Medicare wellness 09-21-2021 Flu vaccine overdue last completed 10-20-2019 Yearly ophthalmology overdue last completed 09-02-2019 6 month A1C overdue last completed 03-25-2020 Mammogram overdue  Dexa scan overdue  Star Rating Drug: Atorvastatin 80 mg- Last filled 08-13-2020 [facility] Losartan 50 mg- Last filled 09-30-2020 [facility] Januvia 100 mg- Last filled 08-16-2020 [facility]  Any gaps in medications fill history? No  Huey Romans [facility] Clinical Pharmacist Assistant [phone]

## 2020-10-14 ENCOUNTER — Ambulatory Visit (INDEPENDENT_AMBULATORY_CARE_PROVIDER_SITE_OTHER): Payer: Medicare PPO

## 2020-10-14 DIAGNOSIS — E782 Mixed hyperlipidemia: Secondary | ICD-10-CM

## 2020-10-14 DIAGNOSIS — E1165 Type 2 diabetes mellitus with hyperglycemia: Secondary | ICD-10-CM

## 2020-10-14 NOTE — Progress Notes (Signed)
Chronic Care Management Pharmacy Note  10/21/2020 Name:  Gabriella Acosta MRN:  [id number] DOB:  [birth year]  Summary: Patient reports that her BS are still high and she does not feel like they are getting any better.   Recommendations/Changes made from today's visit: Recommend patient take her medication at the same time each day.  Recommend C-peptide testing and possible referral with endocrinologist.   Plan: C-peptide testing and follow up appointment with pharmacist and PCP team.    Subjective: Gabriella Acosta is an 75 y.o. year old female who is a primary patient of [name], [place].  The CCM team was consulted for assistance with disease management and care coordination needs.    Engaged with patient by telephone for follow up visit in response to provider referral for pharmacy case management and/or care coordination services. Patient reports that she is concerned about hair loss.   Consent to Services:  The patient was given information about Chronic Care Management services, agreed to services, and gave verbal consent prior to initiation of services.  Please see initial visit note for detailed documentation.   Patient Care Team: [name], FNP as PCP - General (General Practice) Mayford Knife, [facility] (Pharmacist) Daneen Schick as [place] Management  Recent office visits: 10/18/2020 PCP OV  Recent consult visits: None noted   Hospital visits: 07/23/2020 Hospital OV    Objective:  Lab Results  Component Value Date   CREATININE 0.86 05/26/2020   BUN 17 05/26/2020   GFRNONAA >60 05/26/2020   GFRAA 102 10/20/2019   NA 133 (L) 05/26/2020   K 4.9 05/26/2020   CALCIUM 9.8 05/26/2020   CO2 26 05/26/2020   GLUCOSE 352 (H) 05/26/2020    Lab Results  Component Value Date/Time   HGBA1C 13.8 (H) 10/18/2020 10:21 AM   HGBA1C 14.1 (H) 03/25/2020 12:13 PM   MICROALBUR 30 12/10/2018 05:09 PM   MICROALBUR 150 10/31/2017 12:47 PM     Last diabetic Eye exam:  Lab Results  Component Value Date/Time   HMDIABEYEEXA No Retinopathy 09/02/2019 12:00 AM    Last diabetic Foot exam: No results found for: HMDIABFOOTEX   Lab Results  Component Value Date   CHOL 185 03/25/2020   HDL 40 03/25/2020   LDLCALC 91 03/25/2020   TRIG 324 (H) 03/25/2020   CHOLHDL 4.6 (H) 03/25/2020    Hepatic Function Latest Ref Rng & Units 05/26/2020 03/25/2020 10/20/2019  Total Protein 6.5 - 8.1 g/dL 7.8 7.4 7.7  Albumin 3.5 - 5.0 g/dL 4.0 4.6 4.5  AST 15 - 41 U/L '26 30 18  ' ALT 0 - 44 U/L 21 39(H) 21  Alk Phosphatase 38 - 126 U/L 122 161(H) 185(H)  Total Bilirubin 0.3 - 1.2 mg/dL 0.9 0.3 0.3    Lab Results  Component Value Date/Time   TSH 1.240 10/18/2020 10:21 AM   TSH 1.650 10/20/2019 10:22 AM    CBC Latest Ref Rng & Units 05/26/2020 10/20/2019 07/30/2019  WBC 4.0 - 10.5 K/uL 10.6(H) 12.8(H) 9.9  Hemoglobin 12.0 - 15.0 g/dL 13.4 13.5 12.6  Hematocrit 36.0 - 46.0 % 40.5 42.2 37.7  Platelets 150 - 400 K/uL 120(L) 132(L) 92(LL)    No results found for: VD25OH  Clinical ASCVD: No  The 10-year ASCVD risk score (Arnett DK, et al., 2019) is: 48.2%   Values used to calculate the score:     Age: 65 years     Sex: Female     Is Non-Hispanic African American: Yes  Diabetic: Yes     Tobacco smoker: Yes     Systolic Blood Pressure: 546 mmHg     Is BP treated: Yes     HDL Cholesterol: 40 mg/dL     Total Cholesterol: 185 mg/dL    Depression screen [facility] 2/9 08/12/2020 07/30/2019 02/04/2019  Decreased Interest 0 0 0  Down, Depressed, Hopeless 0 0 0  PHQ - 2 Score 0 0 0  Altered sleeping - - -  Tired, decreased energy - - -  Change in appetite - - -  Feeling bad or failure about yourself  - - -  Trouble concentrating - - -  Moving slowly or fidgety/restless - - -  Suicidal thoughts - - -  PHQ-9 Score - - -  Difficult doing work/chores - - -      Social History   Tobacco Use  Smoking Status Every Day   Packs/day: 0.25    Types: Cigarettes  Smokeless Tobacco Current   BP Readings from Last 3 Encounters:  10/18/20 134/80  07/23/20 118/66  05/26/20 (!) 148/74   Pulse Readings from Last 3 Encounters:  10/18/20 74  07/23/20 66  05/26/20 62   Wt Readings from Last 3 Encounters:  10/18/20 116 lb 12.8 oz (53 kg)  08/12/20 118 lb (53.5 kg)  07/23/20 115 lb (52.2 kg)   BMI Readings from Last 3 Encounters:  10/18/20 20.30 kg/m  08/12/20 20.25 kg/m  07/23/20 19.74 kg/m    Assessment/Interventions: Review of patient past medical history, allergies, medications, health status, including review of consultants reports, laboratory and other test data, was performed as part of comprehensive evaluation and provision of chronic care management services.   SDOH:  (Social Determinants of Health) assessments and interventions performed: No  SDOH Screenings   Alcohol Screen: Not on file  Depression (PHQ2-9): Low Risk    PHQ-2 Score: 0  Financial Resource Strain: High Risk   Difficulty of Paying Living Expenses: Hard  Food Insecurity: Food Insecurity Present   Worried About Charity fundraiser in the Last Year: Often true   Arboriculturist in the Last Year: Often true  Housing: Not on file  Physical Activity: Inactive   Days of Exercise per Week: 0 days   Minutes of Exercise per Session: 0 min  Social Connections: Not on file  Stress: No Stress Concern Present   Feeling of Stress : Not at all  Tobacco Use: High Risk   Smoking Tobacco Use: Every Day   Smokeless Tobacco Use: Current  Transportation Needs: No Transportation Needs   Lack of Transportation (Medical): No   Lack of Transportation (Non-Medical): No    CCM Care Plan  No Known Allergies  Medications Reviewed Today     Reviewed by [name], FNP (Family Nurse Practitioner) on 10/18/20 at [place] [facility] Status: <None>   Medication Order Taking? Sig Documenting Provider Last Dose Status Informant  amLODipine (NORVASC) 5 MG tablet  [id number] Yes Take one tablet by mouth daily [name], FNP Taking Active   atorvastatin (LIPITOR) 80 MG tablet [id number]  TAKE ONE TABLET BY MOUTH AT BEDTIME [name], FNP  Active   Blood Glucose Monitoring Suppl DEVI [id number] Yes Check blood sugars twice daily. E11.9 [name], FNP Taking Active Self  Continuous Blood Gluc Sensor (FREESTYLE LIBRE 2 SENSOR) MISC [id number] Yes Use to check blood sugars daily [name], FNP Taking Active Self  glucose blood (ACCU-CHEK GUIDE) test strip [id number] Yes Use as instructed [name],  FNP Taking Active Self  icosapent Ethyl (VASCEPA) 1 g capsule [id number] Yes Take 2 capsules (2 g total) by mouth 2 (two) times daily. Pixie Casino, MD Taking Active Self  insulin aspart (FIASP FLEXTOUCH) 100 UNIT/ML FlexTouch Pen [id number] Yes Inject 6-10 Units into the skin daily as needed (if  blood glucose is in 200 or above). Sliding scale  Use as directed per sliding scale three times daily [provider] Taking Active Self  Insulin Glargine (BASAGLAR KWIKPEN) 100 UNIT/ML [id number] Yes Inject 30 Units into the skin at bedtime. [provider] Taking Active Self           Med Note Alvino Chapel, DONNA J   Fri Jul 23, 2020 11:28 AM) Took 15 units last night, 07/22/2020     Insulin Pen Needle 32G X 6 MM MISC [id number] Yes 1 each by Does not apply route in the morning, at noon, in the evening, and at bedtime. [name], FNP  Active   Lancets (ACCU-CHEK SOFT [place]) lancets [id number] Yes Use as instructed [name], FNP Taking Active Self  losartan (COZAAR) 50 MG tablet [id number]  Take 1 tablet (50 mg total) by mouth daily. [name], FNP  Active   magnesium 30 MG tablet [id number] Yes Take 30 mg by mouth once a week. [provider] Taking Active Self  Multiple Vitamins-Minerals (MULTIVITAMIN WITH MINERALS) tablet [id number] Yes Take 1 tablet by mouth daily. [provider] Taking Active Self  niacin  (NIASPAN) 500 MG CR tablet [id number] Yes TAKE ONE TABLET BY MOUTH EVERYDAY AT BEDTIME [name], FNP Taking Active   potassium chloride (KLOR-CON) 10 MEQ tablet [id number] Yes Take 1 tablet (10 mEq total) by mouth daily. [name], FNP Taking Active   sitaGLIPtin (JANUVIA) 100 MG tablet [id number] Yes Take 1 tablet (100 mg total) by mouth daily. [name], FNP Taking Active   Zoster Vaccine Adjuvanted [facility]) injection [id number] Yes Inject 0.5 mLs into the muscle once for 1 dose. [name], FNP  Active             Patient Active Problem List   Diagnosis Date Noted   Other insomnia 10/18/2020   Uncontrolled type 2 diabetes mellitus with hyperglycemia ([place]) 10/18/2020   Tobacco abuse 07/22/2020   Type 2 diabetes mellitus ([place]) 07/22/2020   Cognitive deficits 11/07/2018   ETOH abuse 07/30/2013   Anxiety 07/30/2013   Essential hypertension 01/31/2011    Immunization History  Administered Date(s) Administered   Fluad Quad(high Dose 65+) 10/20/2019   Janssen (J&J) SARS-COV-2 Vaccination 03/17/2019   PFIZER(Purple Top)SARS-COV-2 Vaccination 01/05/2020, 07/16/2020   Tdap 01/25/2013, 07/29/2013    Conditions to be addressed/monitored:  Hyperlipidemia and Diabetes  Care Plan : [facility]  Updates made by Mayford Knife, [place] since 10/21/2020 12:00 AM     Problem: HLD, DM   Priority: High     Long-Range Goal: Disease Management   Recent Progress: Not on track  Priority: High  Note:   Current Barriers:  Unable to independently monitor therapeutic efficacy  Pharmacist Clinical Goal(s):  Patient will achieve adherence to monitoring guidelines and medication adherence to achieve therapeutic efficacy through collaboration with PharmD and provider.   Interventions: 1:1 collaboration with [name], FNP regarding development and update of comprehensive plan of care as evidenced by provider attestation and co-signature Inter-disciplinary care team  collaboration (see longitudinal plan of care) Comprehensive medication review performed; medication list updated in electronic medical record  Hyperlipidemia: (LDL goal < 70) -Uncontrolled -  Current treatment: Atorvastatin 80 mg tablet once per day  Vascepa 1 gram capsule - taking 2 capsules by mouth two times per day  -Medications previously tried: none noted   -Current dietary patterns: she is trying to limit fried and fatty foods.  -Current exercise habits: patient is not currently exercising  -Educated on Cholesterol goals;  Benefits of statin for ASCVD risk reduction; Importance of limiting foods high in cholesterol; -Recommended to continue current medication  Diabetes (A1c goal <8%) -Uncontrolled -Current medications: Insulin Aspart - she is tired of injecting her self with insulin  No longer taking the sliding scale  Januvia 100 mg tablet once per day  Insulin Glargine 100 unit/ml - inject 30 units at bedtime -Current home glucose readings: she is tired of checking her BS  fasting glucose: she checks her BS through out the day and her readings are in the 200's or higher  -Denies hypoglycemic/hyperglycemic symptoms -Current meal patterns: patient reports she will discuss in more detail during next visit.  -Current exercise: none at this time.  -Patient reports that she does not see any change with her BS, so she is frustrated and would like to stop taking the sliding scale  -Educated on A1c and blood sugar goals; Complications of diabetes including kidney damage, retinal damage, and cardiovascular disease; -Counseled to check feet daily and get yearly eye exams -Recommended to continue current medication -Patient reports poor compliance to her medication regimen because she does not feel like her medication is working when she takes it.  Camera operator with PCP team to have patient C-Peptide test completed   Patient Goals/Self-Care Activities Patient will:  - take  medications as prescribed  Follow Up Plan: The patient has been provided with contact information for the care management team and has been advised to call with any health related questions or concerns.       Medication Assistance: None required.  Patient affirms current coverage meets needs.  Compliance/Adherence/Medication fill history: Care Gaps: Colonoscopy  Shingrix Vaccine Mammogram DEXA Scan Opthamology Exam   Star-Rating Drugs: Atorvastatin 80 mg tablet Losartan 50 mg tablet Januvia 100 mg tablet   Patient's preferred pharmacy is:  [facility] [place], [address] [place] [address] Phone: [phone] Fax: [phone]  [facility] - [place], [place] - [address] [place] [address] Phone: [phone] Fax: [phone]  Uses pill box? No - patient keeps her medication in vials  Pt endorses 85% compliance  We discussed: Benefits of medication synchronization, packaging and delivery as well as enhanced pharmacist oversight with [facility]. Patient decided to: Continue current medication management strategy  Care Plan and Follow Up Patient Decision:  Patient agrees to Care Plan and Follow-up.  Plan: The patient has been provided with contact information for the care management team and has been advised to call with any health related questions or concerns.   [name], PharmD Clinical Pharmacist [facility] [phone]

## 2020-10-18 ENCOUNTER — Encounter: Payer: Self-pay | Admitting: Nurse Practitioner

## 2020-10-18 ENCOUNTER — Ambulatory Visit: Payer: Medicare PPO | Admitting: Nurse Practitioner

## 2020-10-18 VITALS — BP 134/80 | HR 74 | Temp 98.7°F | Ht 63.6 in | Wt 116.8 lb

## 2020-10-18 DIAGNOSIS — I10 Essential (primary) hypertension: Secondary | ICD-10-CM

## 2020-10-18 DIAGNOSIS — R202 Paresthesia of skin: Secondary | ICD-10-CM | POA: Diagnosis not present

## 2020-10-18 DIAGNOSIS — E782 Mixed hyperlipidemia: Secondary | ICD-10-CM

## 2020-10-18 DIAGNOSIS — R200 Anesthesia of skin: Secondary | ICD-10-CM

## 2020-10-18 DIAGNOSIS — Z720 Tobacco use: Secondary | ICD-10-CM | POA: Diagnosis not present

## 2020-10-18 DIAGNOSIS — Z23 Encounter for immunization: Secondary | ICD-10-CM

## 2020-10-18 DIAGNOSIS — E1165 Type 2 diabetes mellitus with hyperglycemia: Secondary | ICD-10-CM

## 2020-10-18 DIAGNOSIS — G4709 Other insomnia: Secondary | ICD-10-CM

## 2020-10-18 DIAGNOSIS — Z2821 Immunization not carried out because of patient refusal: Secondary | ICD-10-CM

## 2020-10-18 DIAGNOSIS — Z1211 Encounter for screening for malignant neoplasm of colon: Secondary | ICD-10-CM | POA: Diagnosis not present

## 2020-10-18 MED ORDER — ATORVASTATIN CALCIUM 80 MG PO TABS: ORAL_TABLET | ORAL | 1 refills | Status: DC

## 2020-10-18 MED ORDER — LOSARTAN POTASSIUM 50 MG PO TABS: 50.0000 mg | ORAL_TABLET | Freq: Every day | ORAL | 1 refills | Status: DC

## 2020-10-18 MED ORDER — ZOSTER VAC RECOMB ADJUVANTED 50 MCG/0.5ML IM SUSR: 0.5000 mL | Freq: Once | INTRAMUSCULAR | 0 refills | Status: AC

## 2020-10-18 MED ORDER — INSULIN PEN NEEDLE 32G X 6 MM MISC: 1.0000 | Freq: Four times a day (QID) | 3 refills | Status: DC

## 2020-10-18 NOTE — Patient Instructions (Addendum)
Diabetes Mellitus and Nutrition, Adult When you have diabetes, or diabetes mellitus, it is very important to have healthy eating habits because your blood sugar (glucose) levels are greatly affected by what you eat and drink. Eating healthy foods in the right amounts, at about the same times every day, can help you: Control your blood glucose. Lower your risk of heart disease. Improve your blood pressure. Reach or maintain a healthy weight. What can affect my meal plan? Every person with diabetes is different, and each person has different needs for a meal plan. Your health care provider may recommend that you work with a dietitian to make a meal plan that is best for you. Your meal plan may vary depending on factors such as: The calories you need. The medicines you take. Your weight. Your blood glucose, blood pressure, and cholesterol levels. Your activity level. Other health conditions you have, such as heart or kidney disease. How do carbohydrates affect me? Carbohydrates, also called carbs, affect your blood glucose level more than any other type of food. Eating carbs naturally raises the amount of glucose in your blood. Carb counting is a method for keeping track of how many carbs you eat. Counting carbs is important to keep your blood glucose at a healthy level, especially if you use insulin or take certain oral diabetes medicines. It is important to know how many carbs you can safely have in each meal. This is different for every person. Your dietitian can help you calculate how many carbs you should have at each meal and for each snack. How does alcohol affect me? Alcohol can cause a sudden decrease in blood glucose (hypoglycemia), especially if you use insulin or take certain oral diabetes medicines. Hypoglycemia can be a life-threatening condition. Symptoms of hypoglycemia, such as sleepiness, dizziness, and confusion, are similar to symptoms of having too much alcohol. Do not drink  alcohol if: Your health care provider tells you not to drink. You are pregnant, may be pregnant, or are planning to become pregnant. If you drink alcohol: Do not drink on an empty stomach. Limit how much you use to: 0-1 drink a day for women. 0-2 drinks a day for men. Be aware of how much alcohol is in your drink. In the [place] one drink equals one 12 oz bottle of beer (355 mL), one 5 oz glass of wine (148 mL), or one 1 oz glass of hard liquor (44 mL). Keep yourself hydrated with water, diet soda, or unsweetened iced tea. Keep in mind that regular soda, juice, and other mixers may contain a lot of sugar and must be counted as carbs. What are tips for following this plan? Reading food labels Start by checking the serving size on the "Nutrition Facts" label of packaged foods and drinks. The amount of calories, carbs, fats, and other nutrients listed on the label is based on one serving of the item. Many items contain more than one serving per package. Check the total grams (g) of carbs in one serving. You can calculate the number of servings of carbs in one serving by dividing the total carbs by 15. For example, if a food has 30 g of total carbs per serving, it would be equal to 2 servings of carbs. Check the number of grams (g) of saturated fats and trans fats in one serving. Choose foods that have a low amount or none of these fats. Check the number of milligrams (mg) of salt (sodium) in one serving. Most people should limit  total sodium intake to less than 2,300 mg per day. Always check the nutrition information of foods labeled as "low-fat" or "nonfat." These foods may be higher in added sugar or refined carbs and should be avoided. Talk to your dietitian to identify your daily goals for nutrients listed on the label. Shopping Avoid buying canned, pre-made, or processed foods. These foods tend to be high in fat, sodium, and added sugar. Shop around the outside edge of the grocery store. This  is where you will most often find fresh fruits and vegetables, bulk grains, fresh meats, and fresh dairy. Cooking Use low-heat cooking methods, such as baking, instead of high-heat cooking methods like deep frying. Cook using healthy oils, such as olive, canola, or sunflower oil. Avoid cooking with butter, cream, or high-fat meats. Meal planning Eat meals and snacks regularly, preferably at the same times every day. Avoid going long periods of time without eating. Eat foods that are high in fiber, such as fresh fruits, vegetables, beans, and whole grains. Talk with your dietitian about how many servings of carbs you can eat at each meal. Eat 4-6 oz (112-168 g) of lean protein each day, such as lean meat, chicken, fish, eggs, or tofu. One ounce (oz) of lean protein is equal to: 1 oz (28 g) of meat, chicken, or fish. 1 egg.  cup (62 g) of tofu. Eat some foods each day that contain healthy fats, such as avocado, nuts, seeds, and fish. What foods should I eat? Fruits Berries. Apples. Oranges. Peaches. Apricots. Plums. Grapes. Mango. Papaya. Pomegranate. Kiwi. Cherries. Vegetables Lettuce. Spinach. Leafy greens, including kale, chard, collard greens, and mustard greens. Beets. Cauliflower. Cabbage. Broccoli. Carrots. Green beans. Tomatoes. Peppers. Onions. Cucumbers. Brussels sprouts. Grains Whole grains, such as whole-wheat or whole-grain bread, crackers, tortillas, cereal, and pasta. Unsweetened oatmeal. Quinoa. Brown or wild rice. Meats and other proteins Seafood. Poultry without skin. Lean cuts of poultry and beef. Tofu. Nuts. Seeds. Dairy Low-fat or fat-free dairy products such as milk, yogurt, and cheese. The items listed above may not be a complete list of foods and beverages you can eat. Contact a dietitian for more information. What foods should I avoid? Fruits Fruits canned with syrup. Vegetables Canned vegetables. Frozen vegetables with butter or cream sauce. Grains Refined  white flour and flour products such as bread, pasta, snack foods, and cereals. Avoid all processed foods. Meats and other proteins Fatty cuts of meat. Poultry with skin. Breaded or fried meats. Processed meat. Avoid saturated fats. Dairy Full-fat yogurt, cheese, or milk. Beverages Sweetened drinks, such as soda or iced tea. The items listed above may not be a complete list of foods and beverages you should avoid. Contact a dietitian for more information. Questions to ask a health care provider Do I need to meet with a diabetes educator? Do I need to meet with a dietitian? What number can I call if I have questions? When are the best times to check my blood glucose? Where to find more information: American Diabetes Association: diabetes.org Academy of Nutrition and Dietetics: www.eatright.org National Institute of Diabetes and Digestive and Kidney Diseases: www.niddk.nih.gov Association of Diabetes Care and Education Specialists: www.diabeteseducator.org Summary It is important to have healthy eating habits because your blood sugar (glucose) levels are greatly affected by what you eat and drink. A healthy meal plan will help you control your blood glucose and maintain a healthy lifestyle. Your health care provider may recommend that you work with a dietitian to make a meal plan that   is best for you. Keep in mind that carbohydrates (carbs) and alcohol have immediate effects on your blood glucose levels. It is important to count carbs and to use alcohol carefully. This information is not intended to replace advice given to you by your health care provider. Make sure you discuss any questions you have with your health care provider. Document Revised: 12/03/2018 Document Reviewed: 12/03/2018 Elsevier Patient Education  2021 Elsevier Inc.  Check your blood sugar 3 times a day and call us with your readings on Monday and Thursday.  Take melatonin up to 10 mg at bedtime for sleep and apply pain  cream to left arm.

## 2020-10-18 NOTE — Progress Notes (Signed)
I,Yamilka J Llittleton,acting as a Education administrator for [employer], FNP.,have documented all relevant documentation on the behalf of [name], FNP,as directed by  [name], FNP while in the presence of [name], [place].   This visit occurred during the SARS-CoV-2 public health emergency.  Safety protocols were in place, including screening questions prior to the visit, additional usage of staff PPE, and extensive cleaning of exam room while observing appropriate contact time as indicated for disinfecting solutions.  Subjective:     Patient ID: Gabriella Acosta , female    DOB: 10-29-1945 , 75 y.o.   MRN: [id number]   Chief Complaint  Patient presents with   Diabetes   Hypertension     HPI  Patient presents today for a f/u on her diabetes/bp. She is on Basaglar 30 units and Fiasp on sliding scale. She is on Creon for her pancreas inflammation. She continues to have a poor appetite. She denies drinking alcohol.   Wt Readings from Last 3 Encounters: 10/18/20 : 116 lb 12.8 oz (53 kg) 08/12/20 : 118 lb (53.5 kg) 07/23/20 : 115 lb (52.2 kg)    Diabetes She presents for her follow-up diabetic visit. She has type 2 diabetes mellitus. Her disease course has been worsening. Pertinent negatives for hypoglycemia include no dizziness or headaches. Associated symptoms include fatigue and weakness (left arm). Pertinent negatives for diabetes include no blurred vision, no chest pain, no polydipsia, no polyphagia and no polyuria. There are no hypoglycemic complications. There are no diabetic complications. Risk factors for coronary artery disease include sedentary lifestyle. She is following a generally unhealthy diet. When asked about meal planning, she reported none. She has not had a previous visit with a dietitian. She never participates in exercise. (230's - 400's) An ACE inhibitor/angiotensin II receptor blocker is being taken. She does not see a podiatrist.Eye exam is not current (she has not had a  diabetic eye exam).  Hyperlipidemia This is a chronic problem. The problem is uncontrolled. Recent lipid tests were reviewed and are high. Pertinent negatives include no chest pain.  Hypertension This is a chronic problem. The current episode started more than 1 year ago. The problem has been gradually improving since onset. Condition status: better controlled. Pertinent negatives include no blurred vision, chest pain, headaches or palpitations. Risk factors for coronary artery disease include sedentary lifestyle, dyslipidemia and diabetes mellitus. Past treatments include angiotensin blockers. There is no history of angina or kidney disease.    Past Medical History:  Diagnosis Date   Diabetes mellitus without complication ([place])    Hypertension    Type 2 diabetes mellitus ([place]) 07/22/2020     Family History  Problem Relation Age of Onset   Diabetes Mother    Diabetes Father    Heart disease Father      Current Outpatient Medications:    amLODipine (NORVASC) 5 MG tablet, Take one tablet by mouth daily, Disp: 90 tablet, Rfl: 0   Blood Glucose Monitoring Suppl DEVI, Check blood sugars twice daily. E11.9, Disp: 1 kit, Rfl: 0   Continuous Blood Gluc Sensor (FREESTYLE LIBRE 2 SENSOR) MISC, Use to check blood sugars daily, Disp: 1 each, Rfl: 5   glucose blood (ACCU-CHEK GUIDE) test strip, Use as instructed, Disp: 100 each, Rfl: 12   icosapent Ethyl (VASCEPA) 1 g capsule, Take 2 capsules (2 g total) by mouth 2 (two) times daily., Disp: 120 capsule, Rfl: 3   insulin aspart (FIASP FLEXTOUCH) 100 UNIT/ML FlexTouch Pen, Inject 6-10 Units into the  skin daily as needed (if  blood glucose is in 200 or above). Sliding scale  Use as directed per sliding scale three times daily, Disp: , Rfl:    Insulin Glargine (BASAGLAR KWIKPEN) 100 UNIT/ML, Inject 30 Units into the skin at bedtime., Disp: , Rfl:    Insulin Pen Needle 32G X 6 MM MISC, 1 each by Does not apply route in the morning, at noon, in the  evening, and at bedtime., Disp: 300 each, Rfl: 3   Lancets (ACCU-CHEK SOFT TOUCH) lancets, Use as instructed, Disp: 100 each, Rfl: 12   magnesium 30 MG tablet, Take 30 mg by mouth once a week., Disp: , Rfl:    Multiple Vitamins-Minerals (MULTIVITAMIN WITH MINERALS) tablet, Take 1 tablet by mouth daily., Disp: , Rfl:    niacin (NIASPAN) 500 MG CR tablet, TAKE ONE TABLET BY MOUTH EVERYDAY AT BEDTIME, Disp: 90 tablet, Rfl: 1   potassium chloride (KLOR-CON) 10 MEQ tablet, Take 1 tablet (10 mEq total) by mouth daily., Disp: 90 tablet, Rfl: 1   sitaGLIPtin (JANUVIA) 100 MG tablet, Take 1 tablet (100 mg total) by mouth daily., Disp: 90 tablet, Rfl: 0   Zoster Vaccine Adjuvanted (SHINGRIX) injection, Inject 0.5 mLs into the muscle once for 1 dose., Disp: 0.5 mL, Rfl: 0   atorvastatin (LIPITOR) 80 MG tablet, TAKE ONE TABLET BY MOUTH AT BEDTIME, Disp: 90 tablet, Rfl: 1   losartan (COZAAR) 50 MG tablet, Take 1 tablet (50 mg total) by mouth daily., Disp: 90 tablet, Rfl: 1   No Known Allergies   Review of Systems  Constitutional:  Positive for appetite change and fatigue.  Eyes:  Negative for blurred vision.  Respiratory: Negative.    Cardiovascular:  Negative for chest pain, palpitations and leg swelling.  Endocrine: Negative for polydipsia, polyphagia and polyuria.  Musculoskeletal: Negative.        Left arm is weak and has pain to the forearm for 2 - 3 weeks  Neurological:  Positive for weakness (left arm). Negative for dizziness and headaches.  Psychiatric/Behavioral: Negative.      Today's Vitals   10/18/20 0928  BP: 134/80  Pulse: 74  Temp: 98.7 F (37.1 C)  Weight: 116 lb 12.8 oz (53 kg)  Height: 5' 3.6" (1.615 m)  PainSc: 3   PainLoc: Arm   Body mass index is 20.3 kg/m.   Objective:  Physical Exam Constitutional:      General: She is not in acute distress.    Appearance: Normal appearance. She is normal weight.  Cardiovascular:     Rate and Rhythm: Normal rate and regular  rhythm.     Pulses: Normal pulses.     Heart sounds: No murmur heard. Pulmonary:     Effort: Pulmonary effort is normal. No respiratory distress.     Breath sounds: Normal breath sounds. No wheezing.  Abdominal:     General: Abdomen is flat. Bowel sounds are normal.     Palpations: Abdomen is soft.  Musculoskeletal:        General: Tenderness (tenderness to left inner elbow antecubital area) present. Normal range of motion.     Cervical back: Normal range of motion and neck supple.  Skin:    General: Skin is warm and dry.     Capillary Refill: Capillary refill takes less than 2 seconds.  Neurological:     General: No focal deficit present.     Mental Status: She is alert and oriented to person, place, and time.  Cranial Nerves: No cranial nerve deficit.     Motor: No weakness.     Gait: Gait normal.  Psychiatric:        Mood and Affect: Mood normal.        Behavior: Behavior normal.        Thought Content: Thought content normal.        Judgment: Judgment normal.        Assessment And Plan:     1. Uncontrolled type 2 diabetes mellitus with hyperglycemia ([place]) Her last HgbA1c was 14.1, she has not followed up with Endocrinology and reports she has an appt in December. I have advised her to be sure to follow up with Endocrinology and to not miss the appt. I feel her fatigue is coming from her elevated HgbA1c. Will check levels today and increase Basaglar as necessary. She is to call on Monday and Thursday with her blood sugar readings.  - Hemoglobin A1c  2. Other insomnia She is to try melatonin nightly  3. Essential hypertension Fair control, she continues with amlodipine Her losartan is likely for kidney protection  - losartan (COZAAR) 50 MG tablet; Take 1 tablet (50 mg total) by mouth daily.  Dispense: 90 tablet; Refill: 1  4. Numbness and tingling in both hands Strength is equal bilaterally, will check for metabolic cause - Vitamin M76 - TSH  5. Immunization  due Rx sent to pharmacy - Zoster Vaccine Adjuvanted [facility]) injection; Inject 0.5 mLs into the muscle once for 1 dose.  Dispense: 0.5 mL; Refill: 0  6. Influenza vaccination declined Patient declined influenza vaccination at this time. Patient is aware that influenza vaccine prevents illness in 70% of healthy people, and reduces hospitalizations to 30-70% in elderly. This vaccine is recommended annually. Pt is willing to accept risk associated with refusing vaccination.  7. Screening for colon cancer According to USPTF Colorectal cancer Screening guidelines. Colonoscopy is recommended every 10 years, starting at age 67 years. Will refer to GI for colon cancer screening. - Ambulatory referral to Gastroenterology  8. Mixed hyperlipidemia Chronic, not well controlled Continue current medications - atorvastatin (LIPITOR) 80 MG tablet; TAKE ONE TABLET BY MOUTH AT BEDTIME  Dispense: 90 tablet; Refill: 1  9. 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  - CT CHEST LUNG CA SCREEN LOW DOSE W/O CM; Future      Patient was given opportunity to ask questions. Patient verbalized understanding of the plan and was able to repeat key elements of the plan. All questions were answered to their satisfaction.  [name], FNP   I, [name], FNP, have reviewed all documentation for this visit. The documentation on 10/18/20 for the exam, diagnosis, procedures, and orders are all accurate and complete.   IF YOU HAVE BEEN REFERRED TO A SPECIALIST, IT MAY TAKE 1-2 WEEKS TO SCHEDULE/PROCESS THE REFERRAL. IF YOU HAVE NOT HEARD FROM US/SPECIALIST IN TWO WEEKS, PLEASE GIVE [place] A CALL AT [phone].   THE PATIENT IS ENCOURAGED TO PRACTICE SOCIAL DISTANCING DUE TO THE COVID-19 PANDEMIC.

## 2020-10-21 ENCOUNTER — Other Ambulatory Visit: Payer: Self-pay | Admitting: Nurse Practitioner

## 2020-10-21 DIAGNOSIS — E1165 Type 2 diabetes mellitus with hyperglycemia: Secondary | ICD-10-CM

## 2020-10-21 LAB — HEMOGLOBIN A1C
Est. average glucose Bld gHb Est-mCnc: 349 mg/dL
Hgb A1c MFr Bld: 13.8 % — ABNORMAL HIGH (ref 4.8–5.6)

## 2020-10-21 LAB — TSH: TSH: 1.24 u[IU]/mL (ref 0.450–4.500)

## 2020-10-21 LAB — VITAMIN B12: Vitamin B-12: 622 pg/mL (ref 232–1245)

## 2020-10-21 MED ORDER — EMPAGLIFLOZIN 10 MG PO TABS: 10.0000 mg | ORAL_TABLET | Freq: Every day | ORAL | 2 refills | Status: DC

## 2020-10-21 NOTE — Patient Instructions (Addendum)
Visit Information It was great speaking with you today!  Please let me know if you have any questions about our visit.   Goals Addressed             This Visit's Progress    Manage My Medicine       Timeframe:  Long-Range Goal Priority:  High Start Date: 05/05/2020                            Expected End Date: 05/05/2021                      Follow Up Date 12/07/2020  In Progress:  - call for medicine refill 2 or 3 days before it runs out - keep a list of all the medicines I take; vitamins and herbals too - use a pillbox to sort medicine - use an alarm clock or phone to remind me to take my medicine    Why is this important?   These steps will help you keep on track with your medicines.           Patient Care Plan: CCM Pharmacy Care Plan     Problem Identified: HLD, DM   Priority: High     Long-Range Goal: Disease Management   Recent Progress: Not on track  Priority: High  Note:    Current Barriers:  Unable to independently monitor therapeutic efficacy Unable to achieve control of diabetes   Unable to maintain control of cholesterol   Pharmacist Clinical Goal(s):  Patient will achieve adherence to monitoring guidelines and medication adherence to achieve therapeutic efficacy through collaboration with PharmD and provider.   Interventions: 1:1 collaboration with [name], FNP regarding development and update of comprehensive plan of care as evidenced by provider attestation and co-signature Inter-disciplinary care team collaboration (see longitudinal plan of care) Comprehensive medication review performed; medication list updated in electronic medical record  Hyperlipidemia: (LDL goal < 70) -Uncontrolled -Current treatment: Atorvastatin 80 mg tablet Vascepa 1 gram capsule twice daily  -Medications previously tried: will follow up  -Current dietary patterns: lowered the the amount of fried and fatty foods that she is eating  -Current exercise  habits: patient reports at this time she is not exercising.  -Educated on Benefits of statin for ASCVD risk reduction; Exercise goal of 150 minutes per week; -Counseled on diet and exercise extensively Recommended to continue current medication    Diabetes (A1c goal <7%) -Uncontrolled -Current medications: Januvia 100 mg tablet daily  GVOKE Hypopen 2- Pack Insulin Aspart 100 unit/ml  - inject 6-10 units into the skin daily as needed  (if blood glucose is in 200 or above.) Insulin Glargine 100 unit/ml - inject 30 units into the skin at bedtime  -Current home glucose readings fasting glucose: 118,112 Post prandial -  -Denies hypoglycemic/hyperglycemic symptoms -Current meal patterns: will follow up with patient  -Current exercise: will discuss with patient  -Educated on A1c and blood sugar goals; Carbohydrate counting and/or plate method -Counseled to check feet daily and get yearly eye exams -Recommended to continue current medication  Patient Goals/Self-Care Activities Patient will:  - take medications as prescribed  Follow Up Plan: Telephone follow up appointment with care management team member scheduled for: The patient has been provided with contact information for the care management team and has been advised to call with any health related questions or concerns.        Patient agreed to  services and verbal consent obtained.   The patient verbalized understanding of instructions, educational materials, and care plan provided today and agreed to receive a mailed copy of patient instructions, educational materials, and care plan.   Gabriella Acosta, PharmD Clinical Pharmacist [facility] [phone]

## 2020-11-04 ENCOUNTER — Telehealth: Payer: Self-pay

## 2020-11-04 NOTE — Chronic Care Management (AMB) (Signed)
Chronic Care Management Pharmacy Assistant   Name: Gabriella Acosta  MRN: [id number] DOB: 19-May-1945   Reason for Encounter: Medication Review/ Medication coordination  Recent office visits:  10-18-2020 [name], FNP. Shingrix ordered. Patient reported not taking gabapentin, glucagon, meloxicam and percocet. A1C= 13.8  Recent consult visits:  None  Hospital visits:  None in previous 6 months  Medications: Outpatient Encounter Medications as of 11/04/2020  Medication Sig Note   amLODipine (NORVASC) 5 MG tablet Take one tablet by mouth daily    atorvastatin (LIPITOR) 80 MG tablet TAKE ONE TABLET BY MOUTH AT BEDTIME    Blood Glucose Monitoring Suppl DEVI Check blood sugars twice daily. E11.9    Continuous Blood Gluc Sensor (FREESTYLE LIBRE 2 SENSOR) MISC Use to check blood sugars daily    empagliflozin (JARDIANCE) 10 MG TABS tablet Take 1 tablet (10 mg total) by mouth daily.    glucose blood (ACCU-CHEK GUIDE) test strip Use as instructed    icosapent Ethyl (VASCEPA) 1 g capsule Take 2 capsules (2 g total) by mouth 2 (two) times daily.    insulin aspart (FIASP FLEXTOUCH) 100 UNIT/ML FlexTouch Pen Inject 6-10 Units into the skin daily as needed (if  blood glucose is in 200 or above). Sliding scale  Use as directed per sliding scale three times daily    Insulin Glargine (BASAGLAR KWIKPEN) 100 UNIT/ML Inject 30 Units into the skin at bedtime. 07/23/2020: Took 15 units last night, 07/22/2020      Insulin Pen Needle 32G X 6 MM MISC 1 each by Does not apply route in the morning, at noon, in the evening, and at bedtime.    Lancets (ACCU-CHEK SOFT TOUCH) lancets Use as instructed    losartan (COZAAR) 50 MG tablet Take 1 tablet (50 mg total) by mouth daily.    magnesium 30 MG tablet Take 30 mg by mouth once a week.    Multiple Vitamins-Minerals (MULTIVITAMIN WITH MINERALS) tablet Take 1 tablet by mouth daily.    niacin (NIASPAN) 500 MG CR tablet TAKE ONE TABLET BY MOUTH EVERYDAY  AT BEDTIME    potassium chloride (KLOR-CON) 10 MEQ tablet Take 1 tablet (10 mEq total) by mouth daily.    sitaGLIPtin (JANUVIA) 100 MG tablet Take 1 tablet (100 mg total) by mouth daily.    No facility-administered encounter medications on file as of 11/04/2020.   Reviewed chart for medication changes ahead of medication coordination call.  No OVs, Consults, or hospital visits since last care coordination call/Pharmacist visit. (If appropriate, list visit date, provider name)  No medication changes indicated OR if recent visit, treatment plan here.  BP Readings from Last 3 Encounters:  10/18/20 134/80  07/23/20 118/66  05/26/20 (!) 148/74    Lab Results  Component Value Date   HGBA1C 13.8 (H) 10/18/2020     Patient obtains medications through Adherence Packaging  90 Days   Last adherence delivery included:  Atorvastatin 80 mg- 1 tablet daily (before breakfast)  Potassium Chloride 10 mg- 1 tablet daily (breakfast) Amlodipine 5 mg- 1 tablet daily (before breakfast) Niacin Er 500 mg- 1 tablet daily (bedtime) Januvia 100 mg- 1 tablet daily (breakfast)   Patient declined (meds) last delivery:  None  Patient is due for next adherence delivery on: 11-16-2020 Called patient and reviewed medications and coordinated delivery.  This delivery to include: Jardiance 10 mg at breakfast Losartan 50 mg at breakfast Atorvastatin 80 mg- 1 tablet daily (before breakfast) Potassium Chloride 10 mg- 1 tablet daily (before  breakfast) Niacin Er 500 mg- 1 tablet daily (bedtime) Januvia 100 mg- 1 tablet daily (breakfast) Amlodipine 5 mg- 1 tablet daily (before breakfast)  Patient will need a short fill of (med), prior to adherence delivery. (To align with sync date or if PRN med)  Coordinated acute fill for (med) to be delivered (date).  Patient declined the following medications (meds) due to (reason)  Patient needs refills for: Jardiance Januvia Amlodipine  Confirmed delivery date of  11-16-2020  advised patient that pharmacy will contact them the morning of delivery.  Care Gaps: Colonoscopy overdue Shingrix overdue PNA vac overdue Yearly ophthalmology last completed 09-02-2019 Covid booster overdue last completed 07-16-2020  Star Rating Drugs: Atorvastatin 80 mg- Last filled 08-13-2020 [facility] Losartan 50 mg- Last filled 09-30-2020 90 [facility] Januvia 100 mg- Last filled 08-16-2020 [facility] Jardiance 10 mg- Last filled 10-21-2020 [facility]  Huey Romans CMA Clinical Pharmacist Assistant [phone]

## 2020-11-08 ENCOUNTER — Other Ambulatory Visit: Payer: Self-pay

## 2020-11-08 DIAGNOSIS — E1165 Type 2 diabetes mellitus with hyperglycemia: Secondary | ICD-10-CM

## 2020-11-08 DIAGNOSIS — E782 Mixed hyperlipidemia: Secondary | ICD-10-CM

## 2020-11-08 MED ORDER — EMPAGLIFLOZIN 10 MG PO TABS: 10.0000 mg | ORAL_TABLET | Freq: Every day | ORAL | 2 refills | Status: DC

## 2020-11-08 MED ORDER — AMLODIPINE BESYLATE 5 MG PO TABS: ORAL_TABLET | ORAL | 0 refills | Status: DC

## 2020-11-08 MED ORDER — SITAGLIPTIN PHOSPHATE 100 MG PO TABS: 100.0000 mg | ORAL_TABLET | Freq: Every day | ORAL | 0 refills | Status: DC

## 2020-11-10 ENCOUNTER — Other Ambulatory Visit: Payer: Self-pay | Admitting: Nurse Practitioner

## 2020-11-10 DIAGNOSIS — E1165 Type 2 diabetes mellitus with hyperglycemia: Secondary | ICD-10-CM

## 2020-12-06 ENCOUNTER — Other Ambulatory Visit: Payer: Self-pay | Admitting: Nurse Practitioner

## 2020-12-06 ENCOUNTER — Telehealth: Payer: Self-pay

## 2020-12-06 NOTE — Chronic Care Management (AMB) (Signed)
  Gabriella Acosta was reminded to have all medications, supplements and any blood glucose and blood pressure readings available for review with Gabriella Acosta, Gabriella Acosta, at her telephone visit on 12-07-2020 at 11:00.   Questions: Have you had any recent office visit or specialist visit outside of [facility]? Patient stated no  Are there any concerns you would like to discuss during your office visit? Patient stated no  Are you having any problems obtaining your medications? (Whether it pharmacy issues or cost) Patient stated no  If patient has any PAP medications ask if they are having any problems getting their PAP medication or refill? No PAP medications  Care Gaps: PNA vac overdue Colonoscopy overdue Shingrix overdue Yearly ophthalmology overdue Covid booster overdue  Star Rating Drug: Atorvastatin 80 mg- Last filled 11-09-2020 [facility] Losartan 50 mg- Last filled 11-14-2020 90 [facility] Januvia 100 mg- Last filled 11-09-2020 [facility] Jardiance 10 mg- Last filled 11-16-2020 [facility]  Any gaps in medications fill history? No  Huey Romans [facility] Clinical Pharmacist Assistant [phone]

## 2020-12-07 ENCOUNTER — Ambulatory Visit (INDEPENDENT_AMBULATORY_CARE_PROVIDER_SITE_OTHER): Payer: Medicare PPO

## 2020-12-07 DIAGNOSIS — E1165 Type 2 diabetes mellitus with hyperglycemia: Secondary | ICD-10-CM

## 2020-12-07 DIAGNOSIS — E782 Mixed hyperlipidemia: Secondary | ICD-10-CM

## 2020-12-07 NOTE — Patient Instructions (Addendum)
Visit Information It was great speaking with you today!  Please let me know if you have any questions about our visit.   Goals Addressed             This Visit's Progress    Monitor and Manage My Blood Sugar-Diabetes Type 2       Timeframe:  Long-Range Goal Priority:  High Start Date:                             Expected End Date:                       Follow Up Date 01/11/2021   - check blood sugar at prescribed times - check blood sugar if I feel it is too high or too low - enter blood sugar readings and medication or insulin into daily log - take the blood sugar log to all doctor visits - take the blood sugar meter to all doctor visits    Why is this important?   Checking your blood sugar at home helps to keep it from getting very high or very low.  Writing the results in a diary or log helps the doctor know how to care for you.  Your blood sugar log should have the time, date and the results.  Also, write down the amount of insulin or other medicine that you take.  Other information, like what you ate, exercise done and how you were feeling, will also be helpful.     Notes:  Please call if you have any questions.         Patient Care Plan: CCM Pharmacy Care Plan     Problem Identified: HLD, DM   Priority: High     Long-Range Goal: Disease Management   Recent Progress: Not on track  Priority: High  Note:   Current Barriers:  Unable to achieve control of diabetes    Pharmacist Clinical Goal(s):  Patient will achieve adherence to monitoring guidelines and medication adherence to achieve therapeutic efficacy through collaboration with PharmD and provider.   Interventions: 1:1 collaboration with [name], FNP regarding development and update of comprehensive plan of care as evidenced by provider attestation and co-signature Inter-disciplinary care team collaboration (see longitudinal plan of care) Comprehensive medication review performed; medication list  updated in electronic medical record  Diabetes (A1c goal <7%) -Uncontrolled -Current medications: Insulin Glargine 100 unit/ml- inject 30 units into the skin at bedtime Insulin Aspart- inject 6-10 units into the skin daily as needed Jardiance 10 mg tablet once per day Januvia 100 mg tablet once per day -Medications previously tried:   -Current home glucose readings fasting glucose: 80,  110 -Denies hypoglycemic/hyperglycemic symptoms -Current meal patterns: will discuss further during next office visit -Current exercise: she is exercising  -Educated on A1c and blood sugar goals; Complications of diabetes including kidney damage, retinal damage, and cardiovascular disease; Counseled to check feet daily and get yearly eye exams -Counseled to check feet daily and get yearly eye exams -Recommended to continue current medication  Hyperlipidemia: (LDL goal < 70) -Not ideally controlled -Current treatment: Atorvastatin 80 mg tablet once per day  -Current dietary patterns: she is limiting fried and fatty foods  -Current exercise habits: will discuss during next office visit.  -Educated on Benefits of statin for ASCVD risk reduction; -Recommended to continue current medication   Patient Goals/Self-Care Activities Patient will:  - take medications as prescribed  as evidenced by patient report and record review  Follow Up Plan: The patient has been provided with contact information for the care management team and has been advised to call with any health related questions or concerns.        Patient agreed to services and verbal consent obtained.   The patient verbalized understanding of instructions, educational materials, and care plan provided today and agreed to receive a mailed copy of patient instructions, educational materials, and care plan.   [name], PharmD Clinical Pharmacist [facility] [phone]

## 2020-12-07 NOTE — Progress Notes (Signed)
Chronic Care Management Pharmacy Note  12/07/2020 Name:  Gabriella Acosta MRN:  [id number] DOB:  October 08, 1945  Summary: Patient reports that her BS readings have improved tremendously   Recommendations/Changes made from today's visit: Recommend patient continue current medication regimen. Encouraged patient to be vaccinated again pneumonia and flu.  Plan: Patient reports that she is going to continue to take her medication everyday. Patient is not interested in being vaccinated against flu or pneumonia at this time.   Subjective: Gabriella Acosta is an 75 y.o. year old female who is a primary patient of [name], [place].  The CCM team was consulted for assistance with disease management and care coordination needs.  Patient reports that she is working at [employer] in [employer]. She is working full time. She works from 8 am-5 pm. She reports that she is still very Psychologist, educational with patient by telephone for follow up visit in response to provider referral for pharmacy case management and/or care coordination services.   Consent to Services:  The patient was given information about Chronic Care Management services, agreed to services, and gave verbal consent prior to initiation of services.  Please see initial visit note for detailed documentation.   Patient Care Team: [name], FNP as PCP - General (General Practice) Mayford Knife, [facility] (Pharmacist) Daneen Schick as [facility] Management  Recent office visits: 10/18/2020 PCP OV  Recent consult visits: None noted in the past 6 months  Hospital visits: None in previous 6 months   Objective:  Lab Results  Component Value Date   CREATININE 0.86 05/26/2020   BUN 17 05/26/2020   GFRNONAA >60 05/26/2020   GFRAA 102 10/20/2019   NA 133 (L) 05/26/2020   K 4.9 05/26/2020   CALCIUM 9.8 05/26/2020   CO2 26 05/26/2020   GLUCOSE 352 (H) 05/26/2020    Lab Results  Component Value  Date/Time   HGBA1C 13.8 (H) 10/18/2020 10:21 AM   HGBA1C 14.1 (H) 03/25/2020 12:13 PM   MICROALBUR 30 12/10/2018 05:09 PM   MICROALBUR 150 10/31/2017 12:47 PM    Last diabetic Eye exam:  Lab Results  Component Value Date/Time   HMDIABEYEEXA No Retinopathy 09/02/2019 12:00 AM    Last diabetic Foot exam: No results found for: HMDIABFOOTEX   Lab Results  Component Value Date   CHOL 185 03/25/2020   HDL 40 03/25/2020   LDLCALC 91 03/25/2020   TRIG 324 (H) 03/25/2020   CHOLHDL 4.6 (H) 03/25/2020    Hepatic Function Latest Ref Rng & Units 05/26/2020 03/25/2020 10/20/2019  Total Protein 6.5 - 8.1 g/dL 7.8 7.4 7.7  Albumin 3.5 - 5.0 g/dL 4.0 4.6 4.5  AST 15 - 41 U/L '26 30 18  ' ALT 0 - 44 U/L 21 39(H) 21  Alk Phosphatase 38 - 126 U/L 122 161(H) 185(H)  Total Bilirubin 0.3 - 1.2 mg/dL 0.9 0.3 0.3    Lab Results  Component Value Date/Time   TSH 1.240 10/18/2020 10:21 AM   TSH 1.650 10/20/2019 10:22 AM    CBC Latest Ref Rng & Units 05/26/2020 10/20/2019 07/30/2019  WBC 4.0 - 10.5 K/uL 10.6(H) 12.8(H) 9.9  Hemoglobin 12.0 - 15.0 g/dL 13.4 13.5 12.6  Hematocrit 36.0 - 46.0 % 40.5 42.2 37.7  Platelets 150 - 400 K/uL 120(L) 132(L) 92(LL)    No results found for: VD25OH  Clinical ASCVD: No  The 10-year ASCVD risk score (Arnett DK, et al., 2019) is: 48.2%   Values  used to calculate the score:     Age: 40 years     Sex: Female     Is Non-Hispanic African American: Yes     Diabetic: Yes     Tobacco smoker: Yes     Systolic Blood Pressure: 809 mmHg     Is BP treated: Yes     HDL Cholesterol: 40 mg/dL     Total Cholesterol: 185 mg/dL    Depression screen [facility] 2/9 08/12/2020 07/30/2019 02/04/2019  Decreased Interest 0 0 0  Down, Depressed, Hopeless 0 0 0  PHQ - 2 Score 0 0 0  Altered sleeping - - -  Tired, decreased energy - - -  Change in appetite - - -  Feeling bad or failure about yourself  - - -  Trouble concentrating - - -  Moving slowly or fidgety/restless - - -  Suicidal  thoughts - - -  PHQ-9 Score - - -  Difficult doing work/chores - - -     Social History   Tobacco Use  Smoking Status Every Day   Packs/day: 0.25   Types: Cigarettes  Smokeless Tobacco Current   BP Readings from Last 3 Encounters:  10/18/20 134/80  07/23/20 118/66  05/26/20 (!) 148/74   Pulse Readings from Last 3 Encounters:  10/18/20 74  07/23/20 66  05/26/20 62   Wt Readings from Last 3 Encounters:  10/18/20 116 lb 12.8 oz (53 kg)  08/12/20 118 lb (53.5 kg)  07/23/20 115 lb (52.2 kg)   BMI Readings from Last 3 Encounters:  10/18/20 20.30 kg/m  08/12/20 20.25 kg/m  07/23/20 19.74 kg/m    Assessment/Interventions: Review of patient past medical history, allergies, medications, health status, including review of consultants reports, laboratory and other test data, was performed as part of comprehensive evaluation and provision of chronic care management services.   SDOH:  (Social Determinants of Health) assessments and interventions performed: No  SDOH Screenings   Alcohol Screen: Not on file  Depression (PHQ2-9): Low Risk    PHQ-2 Score: 0  Financial Resource Strain: High Risk   Difficulty of Paying Living Expenses: Hard  Food Insecurity: Food Insecurity Present   Worried About Charity fundraiser in the Last Year: Often true   Arboriculturist in the Last Year: Often true  Housing: Not on file  Physical Activity: Inactive   Days of Exercise per Week: 0 days   Minutes of Exercise per Session: 0 min  Social Connections: Not on file  Stress: No Stress Concern Present   Feeling of Stress : Not at all  Tobacco Use: High Risk   Smoking Tobacco Use: Every Day   Smokeless Tobacco Use: Current   Passive Exposure: Not on file  Transportation Needs: No Transportation Needs   Lack of Transportation (Medical): No   Lack of Transportation (Non-Medical): No    CCM Care Plan  No Known Allergies  Medications Reviewed Today     Reviewed by [name], FNP  (Family Nurse Practitioner) on 10/18/20 at [place] List Status: <None>   Medication Order Taking? Sig Documenting Provider Last Dose Status Informant  amLODipine (NORVASC) 5 MG tablet [id number] Yes Take one tablet by mouth daily [name], FNP Taking Active   atorvastatin (LIPITOR) 80 MG tablet [id number]  TAKE ONE TABLET BY MOUTH AT BEDTIME [name], FNP  Active   Blood Glucose Monitoring Suppl DEVI [id number] Yes Check blood sugars twice daily. E11.9 [name], FNP Taking Active Self  Continuous Blood Gluc Sensor (FREESTYLE LIBRE 2 SENSOR) MISC [id number] Yes Use to check blood sugars daily [name], FNP Taking Active Self  glucose blood (ACCU-CHEK GUIDE) test strip [id number] Yes Use as instructed [name], FNP Taking Active Self  icosapent Ethyl (VASCEPA) 1 g capsule [id number] Yes Take 2 capsules (2 g total) by mouth 2 (two) times daily. Pixie Casino, MD Taking Active Self  insulin aspart (FIASP FLEXTOUCH) 100 UNIT/ML FlexTouch Pen [id number] Yes Inject 6-10 Units into the skin daily as needed (if  blood glucose is in 200 or above). Sliding scale  Use as directed per sliding scale three times daily [provider] Taking Active Self  Insulin Glargine (BASAGLAR KWIKPEN) 100 UNIT/ML [id number] Yes Inject 30 Units into the skin at bedtime. [provider] Taking Active Self           Med Note Alvino Chapel, DONNA J   Fri Jul 23, 2020 11:28 AM) Took 15 units last night, 07/22/2020     Insulin Pen Needle 32G X 6 MM MISC [id number] Yes 1 each by Does not apply route in the morning, at noon, in the evening, and at bedtime. [name], FNP  Active   Lancets (ACCU-CHEK SOFT [place]) lancets [id number] Yes Use as instructed [name], FNP Taking Active Self  losartan (COZAAR) 50 MG tablet [id number]  Take 1 tablet (50 mg total) by mouth daily. [name], FNP  Active   magnesium 30 MG tablet [id number] Yes Take 30 mg by mouth once a week. [provider] Taking Active Self  Multiple Vitamins-Minerals (MULTIVITAMIN WITH MINERALS) tablet [id number] Yes Take 1 tablet by mouth daily. [provider] Taking Active Self  niacin (NIASPAN) 500 MG CR tablet [id number] Yes TAKE ONE TABLET BY MOUTH EVERYDAY AT BEDTIME [name], FNP Taking Active   potassium chloride (KLOR-CON) 10 MEQ tablet [id number] Yes Take 1 tablet (10 mEq total) by mouth daily. [name], FNP Taking Active   sitaGLIPtin (JANUVIA) 100 MG tablet [id number] Yes Take 1 tablet (100 mg total) by mouth daily. [name], FNP Taking Active   Zoster Vaccine Adjuvanted [facility]) injection [id number] Yes Inject 0.5 mLs into the muscle once for 1 dose. [name], FNP  Active             Patient Active Problem List   Diagnosis Date Noted   Other insomnia 10/18/2020   Uncontrolled type 2 diabetes mellitus with hyperglycemia ([place]) 10/18/2020   Tobacco abuse 07/22/2020   Type 2 diabetes mellitus ([place]) 07/22/2020   Cognitive deficits 11/07/2018   ETOH abuse 07/30/2013   Anxiety 07/30/2013   Essential hypertension 01/31/2011    Immunization History  Administered Date(s) Administered   Fluad Quad(high Dose 65+) 10/20/2019   Janssen (J&J) SARS-COV-2 Vaccination 03/17/2019   PFIZER(Purple Top)SARS-COV-2 Vaccination 01/05/2020, 07/16/2020   Tdap 01/25/2013, 07/29/2013    Conditions to be addressed/monitored:  Hyperlipidemia and Diabetes  Care Plan : [place]  Updates made by Mayford Knife, [place] since 12/07/2020 12:00 AM     Problem: HLD, DM   Priority: High     Long-Range Goal: Disease Management   Recent Progress: Not on track  Priority: High  Note:   Current Barriers:  Unable to achieve control of diabetes    Pharmacist Clinical Goal(s):  Patient will achieve adherence to monitoring guidelines and medication adherence to achieve therapeutic efficacy through collaboration with PharmD and provider.   Interventions: 1:1 collaboration  with [name], [place] regarding development and update  of comprehensive plan of care as evidenced by provider attestation and co-signature Inter-disciplinary care team collaboration (see longitudinal plan of care) Comprehensive medication review performed; medication list updated in electronic medical record  Diabetes (A1c goal <7%) -Uncontrolled -Current medications: Insulin Glargine 100 unit/ml- inject 30 units into the skin at bedtime Insulin Aspart- inject 6-10 units into the skin daily as needed Jardiance 10 mg tablet once per day Januvia 100 mg tablet once per day -Medications previously tried:   -Current home glucose readings fasting glucose: 80,  110 -Denies hypoglycemic/hyperglycemic symptoms -Current meal patterns: will discuss further during next office visit -Current exercise: she is exercising  -Educated on A1c and blood sugar goals; Complications of diabetes including kidney damage, retinal damage, and cardiovascular disease; Counseled to check feet daily and get yearly eye exams -Counseled to check feet daily and get yearly eye exams -Recommended to continue current medication  Hyperlipidemia: (LDL goal < 70) -Not ideally controlled -Current treatment: Atorvastatin 80 mg tablet once per day  -Current dietary patterns: she is limiting fried and fatty foods  -Current exercise habits: will discuss during next office visit.  -Educated on Benefits of statin for ASCVD risk reduction; -Recommended to continue current medication   Patient Goals/Self-Care Activities Patient will:  - take medications as prescribed as evidenced by patient report and record review  Follow Up Plan: The patient has been provided with contact information for the care management team and has been advised to call with any health related questions or concerns.       Medication Assistance: None required.  Patient affirms current coverage meets needs.  Compliance/Adherence/Medication fill  history: Care Gaps: Pneumonia Vaccine Colonoscopy Shingrix Vaccine Mammogram DEXA Scan Ophthalmology Exam COVID-19 Vaccine booster  Star-Rating Drugs: Jardiance 10 mg tablet  Januvia 100 mg tablet  Atorvastatin 80 mg tablet   Patient's preferred pharmacy is:  [facility] [place], [place] [address] [address] Phone: [phone] Fax: [phone]  [facility] - [place], [place] - [address] [place] [address] Phone: [phone] Fax: [phone]  Uses pill box? Yes Pt endorses 95% compliance  We discussed: Benefits of medication synchronization, packaging and delivery as well as enhanced pharmacist oversight with [facility]. Patient decided to: Continue current medication management strategy  Care Plan and Follow Up Patient Decision:  Patient agrees to Care Plan and Follow-up.  Plan: The patient has been provided with contact information for the care management team and has been advised to call with any health related questions or concerns.   [name], CPP, PharmD Clinical Pharmacist Practitioner [facility] [phone]

## 2020-12-08 DIAGNOSIS — E782 Mixed hyperlipidemia: Secondary | ICD-10-CM

## 2020-12-08 DIAGNOSIS — Z7984 Long term (current) use of oral hypoglycemic drugs: Secondary | ICD-10-CM

## 2020-12-08 DIAGNOSIS — E1169 Type 2 diabetes mellitus with other specified complication: Secondary | ICD-10-CM | POA: Diagnosis not present

## 2020-12-08 DIAGNOSIS — Z794 Long term (current) use of insulin: Secondary | ICD-10-CM | POA: Diagnosis not present

## 2020-12-09 ENCOUNTER — Telehealth: Payer: Self-pay

## 2020-12-09 ENCOUNTER — Ambulatory Visit (INDEPENDENT_AMBULATORY_CARE_PROVIDER_SITE_OTHER): Payer: Medicare PPO | Admitting: Internal Medicine

## 2020-12-09 ENCOUNTER — Other Ambulatory Visit (HOSPITAL_COMMUNITY): Payer: Self-pay

## 2020-12-09 ENCOUNTER — Other Ambulatory Visit: Payer: Self-pay

## 2020-12-09 ENCOUNTER — Telehealth: Payer: Self-pay | Admitting: Pharmacy Technician

## 2020-12-09 ENCOUNTER — Encounter: Payer: Self-pay | Admitting: Internal Medicine

## 2020-12-09 VITALS — BP 108/72 | HR 74 | Ht 63.6 in | Wt 116.2 lb

## 2020-12-09 DIAGNOSIS — E1165 Type 2 diabetes mellitus with hyperglycemia: Secondary | ICD-10-CM

## 2020-12-09 DIAGNOSIS — E785 Hyperlipidemia, unspecified: Secondary | ICD-10-CM | POA: Diagnosis not present

## 2020-12-09 LAB — MICROALBUMIN / CREATININE URINE RATIO
Creatinine,U: 45.1 mg/dL
Microalb Creat Ratio: 1.6 mg/g (ref 0.0–30.0)
Microalb, Ur: <0.7 mg/dL (ref 0.0–1.9)

## 2020-12-09 LAB — LIPID PANEL
Cholesterol: 149 mg/dL (ref 0–200)
HDL: 47.5 mg/dL (ref >39.00)
LDL Cholesterol: 67 mg/dL (ref 0–99)
NonHDL: 101.31
Total CHOL/HDL Ratio: 3
Triglycerides: 174 mg/dL — ABNORMAL HIGH (ref 0.0–149.0)
VLDL: 34.8 mg/dL (ref 0.0–40.0)

## 2020-12-09 LAB — BASIC METABOLIC PANEL
BUN: 10 mg/dL (ref 6–23)
CO2: 30 mEq/L (ref 19–32)
Calcium: 10.7 mg/dL — ABNORMAL HIGH (ref 8.4–10.5)
Chloride: 103 mEq/L (ref 96–112)
Creatinine, Ser: 0.7 mg/dL (ref 0.40–1.20)
GFR: 85.51 mL/min (ref >60.00)
Glucose, Bld: 277 mg/dL — ABNORMAL HIGH (ref 70–99)
Potassium: 4.6 mEq/L (ref 3.5–5.1)
Sodium: 142 mEq/L (ref 135–145)

## 2020-12-09 LAB — GLUCOSE, POCT (MANUAL RESULT ENTRY): POC Glucose: 288 mg/dl — AB (ref 70–99)

## 2020-12-09 MED ORDER — DEXCOM G6 TRANSMITTER MISC: 1.0000 | 3 refills | Status: DC

## 2020-12-09 MED ORDER — EMPAGLIFLOZIN 10 MG PO TABS: 10.0000 mg | ORAL_TABLET | Freq: Every day | ORAL | 1 refills | Status: DC

## 2020-12-09 MED ORDER — PIOGLITAZONE HCL 15 MG PO TABS: 15.0000 mg | ORAL_TABLET | Freq: Every day | ORAL | 1 refills | Status: DC

## 2020-12-09 MED ORDER — BASAGLAR KWIKPEN 100 UNIT/ML ~~LOC~~ SOPN: 32.0000 [IU] | PEN_INJECTOR | Freq: Every day | SUBCUTANEOUS | 3 refills | Status: DC

## 2020-12-09 MED ORDER — DEXCOM G6 SENSOR MISC: 1.0000 | 3 refills | Status: DC

## 2020-12-09 NOTE — Telephone Encounter (Signed)
Patient Advocate Encounter   Received notification from [facility]CoverMyMeds that prior authorization for Gabriella Acosta is required by his/her insurance [employer].  Per Test Claim: Lantus is preferred and has a $0 copay.
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
## 2020-12-09 NOTE — Telephone Encounter (Signed)
Error

## 2020-12-09 NOTE — Patient Instructions (Addendum)
-   STOP Januvia  - INcrease Basaglar to 32 units daily  - Continue Jardiance 10 mg daily  - Start Pioglitazone 15 mg daily        Fasting sugar goal less then 140  Two Hours after meals less then 180 mg/dL     HOW TO TREAT LOW BLOOD SUGARS (Blood sugar LESS THAN 70 MG/DL) Please follow the RULE OF 15 for the treatment of hypoglycemia treatment (when your (blood sugars are less than 70 mg/dL)   STEP 1: Take 15 grams of carbohydrates when your blood sugar is low, which includes:  3-4 GLUCOSE TABS  OR 3-4 OZ OF JUICE OR REGULAR SODA OR ONE TUBE OF GLUCOSE GEL    STEP 2: RECHECK blood sugar in 15 MINUTES STEP 3: If your blood sugar is still low at the 15 minute recheck --> then, go back to STEP 1 and treat AGAIN with another 15 grams of carbohydrates.

## 2020-12-10 MED ORDER — LANTUS SOLOSTAR 100 UNIT/ML ~~LOC~~ SOPN: 32.0000 [IU] | PEN_INJECTOR | Freq: Every day | SUBCUTANEOUS | 6 refills | Status: DC

## 2020-12-13 ENCOUNTER — Ambulatory Visit (INDEPENDENT_AMBULATORY_CARE_PROVIDER_SITE_OTHER): Payer: Medicare PPO

## 2020-12-13 ENCOUNTER — Telehealth: Payer: Medicare PPO

## 2020-12-13 DIAGNOSIS — Z720 Tobacco use: Secondary | ICD-10-CM

## 2020-12-13 DIAGNOSIS — I10 Essential (primary) hypertension: Secondary | ICD-10-CM

## 2020-12-13 DIAGNOSIS — E782 Mixed hyperlipidemia: Secondary | ICD-10-CM

## 2020-12-13 DIAGNOSIS — E1165 Type 2 diabetes mellitus with hyperglycemia: Secondary | ICD-10-CM

## 2020-12-14 NOTE — Patient Instructions (Signed)
Visit Information   Thank you for taking time to visit with me today. Please don't hesitate to contact me if I can be of assistance to you before our next scheduled telephone appointment.  Following are the goals we discussed today:  (Copy and paste patient goals from clinical care plan here)  Our next appointment is by telephone on 01/24/21 at 11:40 AM   Please call the care guide team at [phone] if you need to cancel or reschedule your appointment.   If you are experiencing a Mental Health or [place] or need someone to talk to, please call 1-800-273-TALK (toll free, 24 hour hotline)   Following is a copy of your full care plan:   Care Plan : [place] of Care  Updates made by [name], RN since 12/13/2020 12:00 AM     Problem: No plan of care established for management of chronic disease states (DM II, HTN, Mixed hyperlipidemia, Tobacco Use)   Priority: High     Long-Range Goal: Development of plan of care for chronic disease management for DM II, HTN, Mixed hyperlipidemia, Tobacco Use   Start Date: 12/13/2020  Expected End Date: 12/13/2021  This Visit's Progress: On track  Priority: High  Note:   Current Barriers:  Knowledge Deficits related to plan of care for management of DM II, HTN, Mixed hyperlipidemia, Tobacco Use   Chronic Disease Management support and education needs related to DM II, HTN, Mixed hyperlipidemia, Tobacco Use   Financial Restraints   RNCM Clinical Goal(s):  Patient will verbalize basic understanding of  DM II, HTN, Mixed hyperlipidemia, Tobacco Use  disease process and self health management plan as evidenced by patient will report having no disease exacerbations related to her chronic disease states listed above  take all medications exactly as prescribed and will call provider for medication related questions as evidenced by patient will report having no missed doses of her prescribed medications demonstrate Improved  health management independence as evidenced by patient will report 100% adherence to following her prescribed treatment plan as directed  continue to work with RN Care Manager to address care management and care coordination needs related to  DM II, HTN, Mixed hyperlipidemia, Tobacco Use  as evidenced by adherence to CM Team Scheduled appointments demonstrate ongoing self health care management ability   as evidenced by    through collaboration with RN Care manager, provider, and care team.   Interventions: 1:1 collaboration with primary care provider regarding development and update of comprehensive plan of care as evidenced by provider attestation and co-signature Inter-disciplinary care team collaboration (see longitudinal plan of care) Evaluation of current treatment plan related to  self management and patient's adherence to plan as established by provider   Diabetes Interventions:  (Status:  Goal on track:  Yes.) Long Term Goal Discussed patient's recent visit with Endocrinologist, Dr. Kelton Pillar, reviewed and discussed treatment recommendations: Confirmed patient vebalizes understanding of her prescribed treatment plan and denies having questions   MEDICATIONS: STOP Januvia   INcrease Basaglar to 32 units daily - changed to Lantus  Continue Jardiance 10 mg daily  Start Pioglitazone 15 mg daily  EDUCATION / INSTRUCTIONS: BG monitoring instructions: Patient is instructed to check her blood sugars 1 times a day, fasting . Call [place] [facility] if: BG persistently < 70  I reviewed the Rule of 15 for the treatment of hypoglycemia in detail with the patient. Literature supplied.  Assessed patient's understanding of A1c goal: <7% Provided education  to patient about basic DM disease process Reviewed medications with patient and discussed importance of medication adherence Counseled on importance of regular laboratory monitoring as prescribed Provided patient with written  educational materials related to hypo and hyperglycemia and importance of correct treatment Advised patient, providing education and rationale, to check cbg daily before meals and at bedtime and record, calling PCP and RN CM for findings outside established parameters Review of patient status, including review of consultants reports, relevant laboratory and other test results, and medications completed Screening for signs and symptoms of depression related to chronic disease state  Assessed social determinant of health barriers Discussed plans with patient for ongoing care management follow up and provided patient with direct contact information for care management team Lab Results  Component Value Date   HGBA1C 13.8 (H) 10/18/2020   Hyperlipidemia Interventions:  (Status:  Goal on track:  Yes.) Long Term Goal Medication review performed; medication list updated in electronic medical record.  Provider established cholesterol goals reviewed Counseled on importance of regular laboratory monitoring as prescribed Provided HLD educational materials Reviewed exercise goals and target of 150 minutes per week Screening for signs and symptoms of depression related to chronic disease state Discussed plans with patient for ongoing care management follow up and provided patient with direct contact information for care management team Lipid Panel     Component Value Date/Time   CHOL 149 12/09/2020 0856   CHOL 185 03/25/2020 1213   TRIG 174.0 (H) 12/09/2020 0856   HDL 47.50 12/09/2020 0856   HDL 40 03/25/2020 1213   CHOLHDL 3 12/09/2020 0856   VLDL 34.8 12/09/2020 0856   LDLCALC 67 12/09/2020 0856   LDLCALC 91 03/25/2020 1213   LABVLDL 54 (H) 03/25/2020 1213  Patient Goals/Self-Care Activities: Take all medications as prescribed Attend all scheduled provider appointments Call pharmacy for medication refills 3-7 days in advance of running out of medications Perform all self care activities  independently  Call provider office for new concerns or questions  drink 6 to 8 glasses of water each day manage portion size take all medications exactly as prescribed call doctor with any symptoms you believe are related to your medicine call doctor when you experience any new symptoms adhere to prescribed diet: low Sodium, low fat  Follow Up Plan:  Telephone follow up appointment with care management team member scheduled for:  01/24/21      Consent to CCM Services: Ms. Hallmark was given information about Chronic Care Management services including:  CCM service includes personalized support from designated clinical staff supervised by her physician, including individualized plan of care and coordination with other care providers 24/7 contact phone numbers for assistance for urgent and routine care needs. Service will only be billed when office clinical staff spend 20 minutes or more in a month to coordinate care. Only one practitioner may furnish and bill the service in a calendar month. The patient may stop CCM services at any time (effective at the end of the month) by phone call to the office staff. The patient will be responsible for cost sharing (co-pay) of up to 20% of the service fee (after annual deductible is met).  Patient agreed to services and verbal consent obtained.   Patient verbalizes understanding of instructions provided today and agrees to view in [place].   Telephone follow up appointment with care management team member scheduled for: 01/24/21 '@11' :40 AM

## 2020-12-14 NOTE — Chronic Care Management (AMB) (Signed)
Chronic Care Management   CCM RN Visit Note  12/13/2020 Name: Gabriella Acosta MRN: [id number] DOB: Apr 04, 1945  Subjective: Gabriella Acosta is a 75 y.o. year old female who is a primary care patient of [name], FNP. The care management team was consulted for assistance with disease management and care coordination needs.    Engaged with patient by telephone for follow up visit in response to provider referral for case management and/or care coordination services.   Consent to Services:  The patient was given information about Chronic Care Management services, agreed to services, and gave verbal consent prior to initiation of services.  Please see initial visit note for detailed documentation.   Patient agreed to services and verbal consent obtained.   Assessment: Review of patient past medical history, allergies, medications, health status, including review of consultants reports, laboratory and other test data, was performed as part of comprehensive evaluation and provision of chronic care management services.   SDOH (Social Determinants of Health) assessments and interventions performed:  Yes, no acute challenges   CCM Care Plan  No Known Allergies  Outpatient Encounter Medications as of 12/13/2020  Medication Sig   amLODipine (NORVASC) 5 MG tablet Take one tablet by mouth daily   atorvastatin (LIPITOR) 80 MG tablet TAKE ONE TABLET BY MOUTH AT BEDTIME   Blood Glucose Monitoring Suppl DEVI Check blood sugars twice daily. E11.9   Continuous Blood Gluc Sensor (DEXCOM G6 SENSOR) MISC 1 Device by Does not apply route as directed.   Continuous Blood Gluc Transmit (DEXCOM G6 TRANSMITTER) MISC 1 Device by Does not apply route as directed.   empagliflozin (JARDIANCE) 10 MG TABS tablet Take 1 tablet (10 mg total) by mouth daily.   icosapent Ethyl (VASCEPA) 1 g capsule Take 2 capsules (2 g total) by mouth 2 (two) times daily. (Patient not taking: Reported on 12/09/2020)   insulin  glargine (LANTUS SOLOSTAR) 100 UNIT/ML Solostar Pen Inject 32 Units into the skin daily.   Insulin Pen Needle 32G X 6 MM MISC 1 each by Does not apply route in the morning, at noon, in the evening, and at bedtime.   losartan (COZAAR) 50 MG tablet Take 1 tablet (50 mg total) by mouth daily.   magnesium 30 MG tablet Take 30 mg by mouth once a week.   Multiple Vitamins-Minerals (MULTIVITAMIN WITH MINERALS) tablet Take 1 tablet by mouth daily.   niacin (NIASPAN) 500 MG CR tablet TAKE ONE TABLET BY MOUTH EVERYDAY AT BEDTIME   pioglitazone (ACTOS) 15 MG tablet Take 1 tablet (15 mg total) by mouth daily.   potassium chloride (KLOR-CON) 10 MEQ tablet Take 1 tablet (10 mEq total) by mouth daily.   No facility-administered encounter medications on file as of 12/13/2020.    Patient Active Problem List   Diagnosis Date Noted   Dyslipidemia 12/09/2020   Other insomnia 10/18/2020   Uncontrolled type 2 diabetes mellitus with hyperglycemia (HCC) 10/18/2020   Tobacco abuse 07/22/2020   Type 2 diabetes mellitus (HCC) 07/22/2020   Cognitive deficits 11/07/2018   ETOH abuse 07/30/2013   Anxiety 07/30/2013   Essential hypertension 01/31/2011    Conditions to be addressed/monitored: DM II, HTN, Mixed hyperlipidemia, Tobacco Use   Care Plan : RN Care Manager Plan of Care  Updates made by Riley Churches, RN since 12/13/2020 12:00 AM     Problem: No plan of care established for management of chronic disease states (DM II, HTN, Mixed hyperlipidemia, Tobacco Use)   Priority: High  Long-Range Goal: Development of plan of care for chronic disease management for DM II, HTN, Mixed hyperlipidemia, Tobacco Use   Start Date: 12/13/2020  Expected End Date: 12/13/2021  This Visit's Progress: On track  Priority: High  Note:   Current Barriers:  Knowledge Deficits related to plan of care for management of DM II, HTN, Mixed hyperlipidemia, Tobacco Use   Chronic Disease Management support and education needs  related to DM II, HTN, Mixed hyperlipidemia, Tobacco Use   Financial Restraints   RNCM Clinical Goal(s):  Patient will verbalize basic understanding of  DM II, HTN, Mixed hyperlipidemia, Tobacco Use  disease process and self health management plan as evidenced by patient will report having no disease exacerbations related to her chronic disease states listed above  take all medications exactly as prescribed and will call provider for medication related questions as evidenced by patient will report having no missed doses of her prescribed medications demonstrate Improved health management independence as evidenced by patient will report 100% adherence to following her prescribed treatment plan as directed  continue to work with RN Care Manager to address care management and care coordination needs related to  DM II, HTN, Mixed hyperlipidemia, Tobacco Use  as evidenced by adherence to CM Team Scheduled appointments demonstrate ongoing self health care management ability   as evidenced by    through collaboration with RN Care manager, provider, and care team.   Interventions: 1:1 collaboration with primary care provider regarding development and update of comprehensive plan of care as evidenced by provider attestation and co-signature Inter-disciplinary care team collaboration (see longitudinal plan of care) Evaluation of current treatment plan related to  self management and patient's adherence to plan as established by provider   Diabetes Interventions:  (Status:  Goal on track:  Yes.) Long Term Goal Discussed patient's recent visit with Endocrinologist, Dr. Lonzo Cloud, reviewed and discussed treatment recommendations: Confirmed patient vebalizes understanding of her prescribed treatment plan and denies having questions   MEDICATIONS: STOP Januvia   INcrease Basaglar to 32 units daily - changed to Lantus  Continue Jardiance 10 mg daily  Start Pioglitazone 15 mg daily  EDUCATION /  INSTRUCTIONS: BG monitoring instructions: Patient is instructed to check her blood sugars 1 times a day, fasting . Call [place] [facility] if: BG persistently < 70  I reviewed the Rule of 15 for the treatment of hypoglycemia in detail with the patient. Literature supplied.  Assessed patient's understanding of A1c goal: <7% Provided education to patient about basic DM disease process Reviewed medications with patient and discussed importance of medication adherence Counseled on importance of regular laboratory monitoring as prescribed Provided patient with written educational materials related to hypo and hyperglycemia and importance of correct treatment Advised patient, providing education and rationale, to check cbg daily before meals and at bedtime and record, calling PCP and RN CM for findings outside established parameters Review of patient status, including review of consultants reports, relevant laboratory and other test results, and medications completed Screening for signs and symptoms of depression related to chronic disease state  Assessed social determinant of health barriers Discussed plans with patient for ongoing care management follow up and provided patient with direct contact information for care management team Lab Results  Component Value Date   HGBA1C 13.8 (H) 10/18/2020   Hyperlipidemia Interventions:  (Status:  Goal on track:  Yes.) Long Term Goal Medication review performed; medication list updated in electronic medical record.  Provider established cholesterol goals reviewed Counseled on importance of regular  laboratory monitoring as prescribed Provided HLD educational materials Reviewed exercise goals and target of 150 minutes per week Screening for signs and symptoms of depression related to chronic disease state Discussed plans with patient for ongoing care management follow up and provided patient with direct contact information for care management  team Lipid Panel     Component Value Date/Time   CHOL 149 12/09/2020 0856   CHOL 185 03/25/2020 1213   TRIG 174.0 (H) 12/09/2020 0856   HDL 47.50 12/09/2020 0856   HDL 40 03/25/2020 1213   CHOLHDL 3 12/09/2020 0856   VLDL 34.8 12/09/2020 0856   LDLCALC 67 12/09/2020 0856   LDLCALC 91 03/25/2020 1213   LABVLDL 54 (H) 03/25/2020 1213  Patient Goals/Self-Care Activities: Take all medications as prescribed Attend all scheduled provider appointments Call pharmacy for medication refills 3-7 days in advance of running out of medications Perform all self care activities independently  Call provider office for new concerns or questions  drink 6 to 8 glasses of water each day manage portion size take all medications exactly as prescribed call doctor with any symptoms you believe are related to your medicine call doctor when you experience any new symptoms adhere to prescribed diet: low Sodium, low fat  Follow Up Plan:  Telephone follow up appointment with care management team member scheduled for:  01/24/21      Plan:Telephone follow up appointment with care management team member scheduled for:  01/24/21  Delsa Sale, RN, BSN, CCM Care Management Coordinator [facility] Management/[facility]  Direct Phone: [phone]

## 2020-12-19 LAB — ISLET CELL AB SCREEN RFLX TO TITER: ISLET CELL ANTIBODY SCREEN: NEGATIVE

## 2020-12-19 LAB — GLUTAMIC ACID DECARBOXYLASE AUTO ABS: Glutamic Acid Decarb Ab: <5 IU/mL (ref <5)

## 2021-01-08 DIAGNOSIS — E1165 Type 2 diabetes mellitus with hyperglycemia: Secondary | ICD-10-CM

## 2021-01-08 DIAGNOSIS — I10 Essential (primary) hypertension: Secondary | ICD-10-CM

## 2021-01-08 DIAGNOSIS — E782 Mixed hyperlipidemia: Secondary | ICD-10-CM | POA: Diagnosis not present

## 2021-01-11 ENCOUNTER — Ambulatory Visit (INDEPENDENT_AMBULATORY_CARE_PROVIDER_SITE_OTHER): Payer: Medicare PPO

## 2021-01-11 DIAGNOSIS — E782 Mixed hyperlipidemia: Secondary | ICD-10-CM

## 2021-01-11 DIAGNOSIS — E1165 Type 2 diabetes mellitus with hyperglycemia: Secondary | ICD-10-CM

## 2021-01-11 NOTE — Progress Notes (Signed)
Chronic Care Management Pharmacy Note  01/14/2021 Name:  Gabriella Acosta MRN:  [id number] DOB:  1945/06/01  Summary: Patient reports that she is doing well.   Recommendations/Changes made from today's visit: Recommend patient receive COVID-19 booster.   Plan: Patient to receive COVID-19 booster at [facility]. Patient requests assistance with getting a CGM device that can be be placed on her stomach and can be scanned by her phone.    Subjective: Gabriella Acosta is an 76 y.o. year old female who is a primary patient of [name], [place].  The CCM team was consulted for assistance with disease management and care coordination needs.    Engaged with patient by telephone for follow up visit in response to provider referral for pharmacy case management and/or care coordination services.   Consent to Services:  The patient was given information about Chronic Care Management services, agreed to services, and gave verbal consent prior to initiation of services.  Please see initial visit note for detailed documentation.   Patient Care Team: [name], FNP as PCP - General (General Practice) Mayford Knife, [facility] (Pharmacist) Daneen Schick as [place] Management  Recent office visits: 10/18/2020 PCP OV   Recent consult visits: 12/09/2020 Endocrinology [facility] visits: None in previous 6 months   Objective:  Lab Results  Component Value Date   CREATININE 0.70 12/09/2020   BUN 10 12/09/2020   GFR 85.51 12/09/2020   GFRNONAA >60 05/26/2020   GFRAA 102 10/20/2019   NA 142 12/09/2020   K 4.6 12/09/2020   CALCIUM 10.7 (H) 12/09/2020   CO2 30 12/09/2020   GLUCOSE 277 (H) 12/09/2020    Lab Results  Component Value Date/Time   HGBA1C 13.8 (H) 10/18/2020 10:21 AM   HGBA1C 14.1 (H) 03/25/2020 12:13 PM   GFR 85.51 12/09/2020 08:56 AM   MICROALBUR <0.7 12/09/2020 08:56 AM   MICROALBUR 30 12/10/2018 05:09 PM   MICROALBUR 150 10/31/2017  12:47 PM    Last diabetic Eye exam:  Lab Results  Component Value Date/Time   HMDIABEYEEXA No Retinopathy 09/02/2019 12:00 AM    Last diabetic Foot exam: No results found for: HMDIABFOOTEX   Lab Results  Component Value Date   CHOL 149 12/09/2020   HDL 47.50 12/09/2020   LDLCALC 67 12/09/2020   TRIG 174.0 (H) 12/09/2020   CHOLHDL 3 12/09/2020    Hepatic Function Latest Ref Rng & Units 05/26/2020 03/25/2020 10/20/2019  Total Protein 6.5 - 8.1 g/dL 7.8 7.4 7.7  Albumin 3.5 - 5.0 g/dL 4.0 4.6 4.5  AST 15 - 41 U/L _0 ALT 0 - 44 U/L 21 39(H) 21  Alk Phosphatase 38 - 126 U/L 122 161(H) 185(H)  Total Bilirubin 0.3 - 1.2 mg/dL 0.9 0.3 0.3    Lab Results  Component Value Date/Time   TSH 1.240 10/18/2020 10:21 AM   TSH 1.650 10/20/2019 10:22 AM    CBC Latest Ref Rng & Units 05/26/2020 10/20/2019 07/30/2019  WBC 4.0 - 10.5 K/uL 10.6(H) 12.8(H) 9.9  Hemoglobin 12.0 - 15.0 g/dL 13.4 13.5 12.6  Hematocrit 36.0 - 46.0 % 40.5 42.2 37.7  Platelets 150 - 400 K/uL 120(L) 132(L) 92(LL)    No results found for: VD25OH  Clinical ASCVD: No  The 10-year ASCVD risk score (Arnett DK, et al., 2019) is: 33.9%   Values used to calculate the score:     Age: 55 years     Sex: Female     Is Non-Hispanic  African American: Yes     Diabetic: Yes     Tobacco smoker: Yes     Systolic Blood Pressure: 694 mmHg     Is BP treated: Yes     HDL Cholesterol: 47.5 mg/dL     Total Cholesterol: 149 mg/dL    Depression screen [facility] 2/9 08/12/2020 07/30/2019 02/04/2019  Decreased Interest 0 0 0  Down, Depressed, Hopeless 0 0 0  PHQ - 2 Score 0 0 0  Altered sleeping - - -  Tired, decreased energy - - -  Change in appetite - - -  Feeling bad or failure about yourself  - - -  Trouble concentrating - - -  Moving slowly or fidgety/restless - - -  Suicidal thoughts - - -  PHQ-9 Score - - -  Difficult doing work/chores - - -       Social History   Tobacco Use  Smoking Status Every Day   Packs/day:  0.25   Types: Cigarettes  Smokeless Tobacco Current   BP Readings from Last 3 Encounters:  12/09/20 108/72  10/18/20 134/80  07/23/20 118/66   Pulse Readings from Last 3 Encounters:  12/09/20 74  10/18/20 74  07/23/20 66   Wt Readings from Last 3 Encounters:  12/09/20 116 lb 3.2 oz (52.7 kg)  10/18/20 116 lb 12.8 oz (53 kg)  08/12/20 118 lb (53.5 kg)   BMI Readings from Last 3 Encounters:  12/09/20 20.20 kg/m  10/18/20 20.30 kg/m  08/12/20 20.25 kg/m    Assessment/Interventions: Review of patient past medical history, allergies, medications, health status, including review of consultants reports, laboratory and other test data, was performed as part of comprehensive evaluation and provision of chronic care management services.   SDOH:  (Social Determinants of Health) assessments and interventions performed: Yes  SDOH Screenings   Alcohol Screen: Not on file  Depression (PHQ2-9): Low Risk    PHQ-2 Score: 0  Financial Resource Strain: High Risk   Difficulty of Paying Living Expenses: Hard  Food Insecurity: Food Insecurity Present   Worried About Charity fundraiser in the Last Year: Often true   Arboriculturist in the Last Year: Often true  Housing: Not on file  Physical Activity: Inactive   Days of Exercise per Week: 0 days   Minutes of Exercise per Session: 0 min  Social Connections: Not on file  Stress: No Stress Concern Present   Feeling of Stress : Not at all  Tobacco Use: High Risk   Smoking Tobacco Use: Every Day   Smokeless Tobacco Use: Current   Passive Exposure: Not on file  Transportation Needs: No Transportation Needs   Lack of Transportation (Medical): No   Lack of Transportation (Non-Medical): No    CCM Care Plan  No Known Allergies  Medications Reviewed Today     Reviewed by Mayford Knife, RPH (Pharmacist) on 01/11/21 at 1152  Med List Status: <None>   Medication Order Taking? Sig Documenting Provider Last Dose Status Informant   amLODipine (NORVASC) 5 MG tablet [id number] Yes Take one tablet by mouth daily [name], FNP  Active   atorvastatin (LIPITOR) 80 MG tablet [id number] Yes TAKE ONE TABLET BY MOUTH AT BEDTIME [name], FNP  Active   Blood Glucose Monitoring Suppl DEVI [id number]  Check blood sugars twice daily. E11.9 [name], FNP  Active Self  Continuous Blood Gluc Sensor (DEXCOM G6 SENSOR) MISC [id number] Yes 1 Device by Does not apply route as directed. Shamleffer, Melanie Crazier,  MD  Active   Continuous Blood Gluc Transmit (DEXCOM G6 TRANSMITTER) MISC [id number] Yes 1 Device by Does not apply route as directed. Shamleffer, Melanie Crazier, MD  Active   empagliflozin (JARDIANCE) 10 MG TABS tablet [id number]  Take 1 tablet (10 mg total) by mouth daily. Shamleffer, Melanie Crazier, MD  Active   icosapent Ethyl (VASCEPA) 1 g capsule [id number]  Take 2 capsules (2 g total) by mouth 2 (two) times daily.  Patient not taking: Reported on 12/09/2020   Pixie Casino, MD  Active Self  insulin glargine (LANTUS SOLOSTAR) 100 UNIT/ML Solostar Pen [id number] Yes Inject 32 Units into the skin daily. Shamleffer, Melanie Crazier, MD  Active   Insulin Pen Needle 32G X 6 MM MISC [id number] Yes 1 each by Does not apply route in the morning, at noon, in the evening, and at bedtime. [name], FNP  Active   losartan (COZAAR) 50 MG tablet [id number]  Take 1 tablet (50 mg total) by mouth daily. [name], FNP  Active   magnesium 30 MG tablet [id number]  Take 30 mg by mouth once a week. [provider]  Active Self  Multiple Vitamins-Minerals (MULTIVITAMIN WITH MINERALS) tablet [id number]  Take 1 tablet by mouth daily. [provider]  Active Self  niacin (NIASPAN) 500 MG CR tablet [id number]  TAKE ONE TABLET BY MOUTH EVERYDAY AT BEDTIME [name], FNP  Active   pioglitazone (ACTOS) 15 MG tablet [id number] Yes Take 1 tablet (15 mg total) by mouth daily. Shamleffer, Melanie Crazier, MD  Active   potassium  chloride (KLOR-CON) 10 MEQ tablet [id number]  Take 1 tablet (10 mEq total) by mouth daily. [name], FNP  Active             Patient Active Problem List   Diagnosis Date Noted   Dyslipidemia 12/09/2020   Other insomnia 10/18/2020   Uncontrolled type 2 diabetes mellitus with hyperglycemia ([place]) 10/18/2020   Tobacco abuse 07/22/2020   Type 2 diabetes mellitus ([place]) 07/22/2020   Cognitive deficits 11/07/2018   ETOH abuse 07/30/2013   Anxiety 07/30/2013   Essential hypertension 01/31/2011    Immunization History  Administered Date(s) Administered   Fluad Quad(high Dose 65+) 10/20/2019   Janssen (J&J) SARS-COV-2 Vaccination 03/17/2019   PFIZER(Purple Top)SARS-COV-2 Vaccination 01/05/2020, 07/16/2020   Tdap 01/25/2013, 07/29/2013    Conditions to be addressed/monitored:  Hyperlipidemia and Diabetes  Care Plan : [place]  Updates made by Mayford Knife, RPH since 01/14/2021 12:00 AM     Problem: HLD, DM   Priority: High     Long-Range Goal: Disease Management   This Visit's Progress: On track  Recent Progress: Not on track  Priority: High  Note:   Current Barriers:  Unable to independently monitor therapeutic efficacy  Pharmacist Clinical Goal(s):  Patient will achieve adherence to monitoring guidelines and medication adherence to achieve therapeutic efficacy through collaboration with PharmD and provider.   Interventions: 1:1 collaboration with [name], FNP regarding development and update of comprehensive plan of care as evidenced by provider attestation and co-signature Inter-disciplinary care team collaboration (see longitudinal plan of care) Comprehensive medication review performed; medication list updated in electronic medical record  Hyperlipidemia: (LDL goal < 70) -Controlled -Current treatment: Atorvastatin 80 mg tablet once per day Appropriate, Effective, Safe, Affordable  Vascepa 1 gram capsule - 2 grams 2 times daily  Appropriate, Effective, Safe, Affordable Niacin 500 mg CR -take one tablet by mouth every day at bedtime Appropriate, Query effective -Current dietary  patterns: she is avoiding fried and fatty foods  -Current exercise habits: she is not going out after work, she is at work during the day, she does a lot of exercise at work and across campus -Cpngratulated patient on meeting cholesterol goals  -Educated on Exercise goal of 150 minutes per week; -Recommended to continue current medication  Diabetes (A1c goal <8%) -Not ideally controlled -Current medications: Jardiance 10 mg tablet once per day  (appropriate, effectice, safe and accessible) Pioglitazone 15 mg tablet once per day( appropriate, effectice, safe and accessible) Lantus 32 units daily (appropriate, effectice, safe and accessible)  -Current home glucose readings - patient reports that she has not checked her BS in a while  -Denies hypoglycemic/hyperglycemic symptoms -Current meal patterns: she is eating much better and has an increased appetite  drinks: she is drinking a lot of water, all day long  -Current exercise: patient reports that she has not started back going to the gym yet because of the time she goes  -Educated on Continuous glucose monitoring; -She would like help accessing the other monitor that goes on her stomach that she will be able to use her phone with -Patient is going to continue to use the  -Counseled to check feet daily and get yearly eye exams -Recommended to continue current medication  Health Maintenance -Vaccine gaps: COVID-19 Vaccine booster (scheduled during this OV at [facility] today at 5:30 PM), Pneumonia Vaccine, Shingrix Vaccine -Educated on the importance of receiving vaccinations  -Discussed vaccination eligibility patient agreeable  -Collaborated with patient to schedule COVID-19 booster vaccine for today.   Patient Goals/Self-Care Activities Patient will:  - take medications as prescribed  as evidenced by patient report and record review  Follow Up Plan: The patient has been provided with contact information for the care management team and has been advised to call with any health related questions or concerns.       Medication Assistance: None required.  Patient affirms current coverage meets needs.  Compliance/Adherence/Medication fill history: Care Gaps: Pneumonia Vaccine Colonoscopy Shingrix Vaccine Mammogram DEXA Scan  Ophthalmology Exam COVID-19 Booster  Star-Rating Drugs: Atorvastatin 80 mg tablet Actos 15 mg tablet  Losartan 50 mg tablet Jardiance 10 mg tablet   Patient's preferred pharmacy is:  [facility] [address], [address] [place] [address] Phone: [phone] Fax: [phone]  [facility] - [place], [place] - [address] [place] [address] Phone: [phone] Fax: [phone]  Uses pill box? Yes Pt endorses 90% compliance  We discussed: Benefits of medication synchronization, packaging and delivery as well as enhanced pharmacist oversight with [facility]. Patient decided to: Continue current medication management strategy . Gabriella Acosta is already using [facility] for medication packaging and deliver services.   Care Plan and Follow Up Patient Decision:  Patient agrees to Care Plan and Follow-up.  Plan: The patient has been provided with contact information for the care management team and has been advised to call with any health related questions or concerns.   [name], CPP, PharmD Clinical Pharmacist Practitioner [facility] [phone]

## 2021-01-14 NOTE — Patient Instructions (Addendum)
Visit Information It was great speaking with you today!  Please let me know if you have any questions about our visit.   Goals Addressed             This Visit's Progress    Manage My Medicine       Timeframe:  Long-Range Goal Priority:  High Start Date: 05/05/2020                            Expected End Date: 05/05/2021                      Follow Up Date 03/25/2021  In Progress:  - call for medicine refill 2 or 3 days before it runs out - keep a list of all the medicines I take; vitamins and herbals too - use a pillbox to sort medicine - use an alarm clock or phone to remind me to take my medicine    Why is this important?   These steps will help you keep on track with your medicines.           Patient Care Plan: CCM Pharmacy Care Plan     Problem Identified: HLD, DM   Priority: High     Long-Range Goal: Disease Management   This Visit's Progress: On track  Recent Progress: Not on track  Priority: High  Note:   Current Barriers:  Unable to independently monitor therapeutic efficacy  Pharmacist Clinical Goal(s):  Patient will achieve adherence to monitoring guidelines and medication adherence to achieve therapeutic efficacy through collaboration with PharmD and provider.   Interventions: 1:1 collaboration with [name], FNP regarding development and update of comprehensive plan of care as evidenced by provider attestation and co-signature Inter-disciplinary care team collaboration (see longitudinal plan of care) Comprehensive medication review performed; medication list updated in electronic medical record  Hyperlipidemia: (LDL goal < 70) -Controlled -Current treatment: Atorvastatin 80 mg tablet once per day Appropriate, Effective, Safe, Affordable  Vascepa 1 gram capsule - 2 grams 2 times daily Appropriate, Effective, Safe, Affordable Niacin 500 mg CR -take one tablet by mouth every day at bedtime Appropriate, Query effective -Current dietary  patterns: she is avoiding fried and fatty foods  -Current exercise habits: she is not going out after work, she is at work during the day, she does a lot of exercise at work and across campus -Cpngratulated patient on meeting cholesterol goals  -Educated on Exercise goal of 150 minutes per week; -Recommended to continue current medication  Diabetes (A1c goal <8%) -Not ideally controlled -Current medications: Jardiance 10 mg tablet once per day  (appropriate, effectice, safe and accessible) Pioglitazone 15 mg tablet once per day( appropriate, effectice, safe and accessible) Lantus 32 units daily (appropriate, effectice, safe and accessible)  -Current home glucose readings - patient reports that she has not checked her BS in a while  -Denies hypoglycemic/hyperglycemic symptoms -Current meal patterns: she is eating much better and has an increased appetite  drinks: she is drinking a lot of water, all day long  -Current exercise: patient reports that she has not started back going to the gym yet because of the time she goes  -Educated on Continuous glucose monitoring; -She would like help accessing the other monitor that goes on her stomach that she will be able to use her phone with -Patient is going to continue to use the  -Counseled to check feet daily and get yearly eye exams -  Recommended to continue current medication  Health Maintenance -Vaccine gaps: COVID-19 Vaccine booster (scheduled during this OV at [facility] today at 5:30 PM), Pneumonia Vaccine, Shingrix Vaccine -Educated on the importance of receiving vaccinations  -Discussed vaccination eligibility patient agreeable  -Collaborated with patient to schedule COVID-19 booster vaccine for today.   Patient Goals/Self-Care Activities Patient will:  - take medications as prescribed as evidenced by patient report and record review  Follow Up Plan: The patient has been provided with contact information for the care management team  and has been advised to call with any health related questions or concerns.       Patient agreed to services and verbal consent obtained.   The patient verbalized understanding of instructions, educational materials, and care plan provided today and agreed to receive a mailed copy of patient instructions, educational materials, and care plan.   [name], PharmD Clinical Pharmacist [facility] [phone]

## 2021-01-20 ENCOUNTER — Ambulatory Visit: Payer: Medicare PPO | Admitting: Nurse Practitioner

## 2021-01-24 ENCOUNTER — Ambulatory Visit: Payer: Self-pay

## 2021-01-24 ENCOUNTER — Telehealth: Payer: Medicare PPO

## 2021-01-24 DIAGNOSIS — Z720 Tobacco use: Secondary | ICD-10-CM

## 2021-01-24 DIAGNOSIS — I10 Essential (primary) hypertension: Secondary | ICD-10-CM

## 2021-01-24 DIAGNOSIS — E1165 Type 2 diabetes mellitus with hyperglycemia: Secondary | ICD-10-CM

## 2021-01-24 DIAGNOSIS — E782 Mixed hyperlipidemia: Secondary | ICD-10-CM

## 2021-01-24 NOTE — Chronic Care Management (AMB) (Signed)
Chronic Care Management   CCM RN Visit Note  01/24/2021 Name: Gabriella Acosta MRN: [id number] DOB: 01-04-1946  Subjective: Gabriella Acosta is a 76 y.o. year old female who is a primary care patient of [name], [name]. The care management team was consulted for assistance with disease management and care coordination needs.    Engaged with patient by telephone for follow up visit in response to provider referral for case management and/or care coordination services.   Consent to Services:  The patient was given information about Chronic Care Management services, agreed to services, and gave verbal consent prior to initiation of services.  Please see initial visit note for detailed documentation.   Patient agreed to services and verbal consent obtained.   Assessment: Review of patient past medical history, allergies, medications, health status, including review of consultants reports, laboratory and other test data, was performed as part of comprehensive evaluation and provision of chronic care management services.   SDOH (Social Determinants of Health) assessments and interventions performed:    CCM Care Plan  No Known Allergies  Outpatient Encounter Medications as of 01/24/2021  Medication Sig   amLODipine (NORVASC) 5 MG tablet Take one tablet by mouth daily   atorvastatin (LIPITOR) 80 MG tablet TAKE ONE TABLET BY MOUTH AT BEDTIME   Blood Glucose Monitoring Suppl DEVI Check blood sugars twice daily. E11.9   Continuous Blood Gluc Sensor (DEXCOM G6 SENSOR) MISC 1 Device by Does not apply route as directed.   Continuous Blood Gluc Transmit (DEXCOM G6 TRANSMITTER) MISC 1 Device by Does not apply route as directed.   empagliflozin (JARDIANCE) 10 MG TABS tablet Take 1 tablet (10 mg total) by mouth daily.   icosapent Ethyl (VASCEPA) 1 g capsule Take 2 capsules (2 g total) by mouth 2 (two) times daily. (Patient not taking: Reported on 12/09/2020)   insulin glargine (LANTUS SOLOSTAR)  100 UNIT/ML Solostar Pen Inject 32 Units into the skin daily.   Insulin Pen Needle 32G X 6 MM MISC 1 each by Does not apply route in the morning, at noon, in the evening, and at bedtime.   losartan (COZAAR) 50 MG tablet Take 1 tablet (50 mg total) by mouth daily.   magnesium 30 MG tablet Take 30 mg by mouth once a week.   Multiple Vitamins-Minerals (MULTIVITAMIN WITH MINERALS) tablet Take 1 tablet by mouth daily.   niacin (NIASPAN) 500 MG CR tablet TAKE ONE TABLET BY MOUTH EVERYDAY AT BEDTIME   pioglitazone (ACTOS) 15 MG tablet Take 1 tablet (15 mg total) by mouth daily.   potassium chloride (KLOR-CON) 10 MEQ tablet Take 1 tablet (10 mEq total) by mouth daily.   No facility-administered encounter medications on file as of 01/24/2021.    Patient Active Problem List   Diagnosis Date Noted   Dyslipidemia 12/09/2020   Other insomnia 10/18/2020   Uncontrolled type 2 diabetes mellitus with hyperglycemia ([place]) 10/18/2020   Tobacco abuse 07/22/2020   Type 2 diabetes mellitus ([place]) 07/22/2020   Cognitive deficits 11/07/2018   ETOH abuse 07/30/2013   Anxiety 07/30/2013   Essential hypertension 01/31/2011    Conditions to be addressed/monitored: DM II, HTN, Mixed hyperlipidemia, Tobacco Use   Care Plan : Acute Pain (Adult)  Updates made by [name], RN since 01/24/2021 12:00 AM  Completed 01/24/2021   Problem: Pain Management (Acute Pain) Resolved 12/13/2020  Priority: High  Note:   Resolving due to duplicate goal    Care Plan : [place]  Updates made by [name], RN since 01/24/2021 12:00 AM     Problem: No plan of care established for management of chronic disease states (DM II, HTN, Mixed hyperlipidemia, Tobacco Use)   Priority: High     Long-Range Goal: Development of plan of care for chronic disease management for DM II, HTN, Mixed hyperlipidemia, Tobacco Use   Start Date: 12/13/2020  Expected End Date: 12/13/2021  Recent Progress: On track   Priority: High  Note:   Current Barriers:  Knowledge Deficits related to plan of care for management of DM II, HTN, Mixed hyperlipidemia, Tobacco Use   Chronic Disease Management support and education needs related to DM II, HTN, Mixed hyperlipidemia, Tobacco Use   Financial Restraints   RNCM Clinical Goal(s):  Patient will verbalize basic understanding of  DM II, HTN, Mixed hyperlipidemia, Tobacco Use  disease process and self health management plan as evidenced by patient will report having no disease exacerbations related to her chronic disease states listed above  take all medications exactly as prescribed and will call provider for medication related questions as evidenced by patient will report having no missed doses of her prescribed medications demonstrate Improved health management independence as evidenced by patient will report 100% adherence to following her prescribed treatment plan as directed  continue to work with RN Care Manager to address care management and care coordination needs related to  DM II, HTN, Mixed hyperlipidemia, Tobacco Use  as evidenced by adherence to CM Team Scheduled appointments demonstrate ongoing self health care management ability   as evidenced by    through collaboration with RN Care manager, provider, and care team.   Interventions: 1:1 collaboration with primary care provider regarding development and update of comprehensive plan of care as evidenced by provider attestation and co-signature Inter-disciplinary care team collaboration (see longitudinal plan of care) Evaluation of current treatment plan related to  self management and patient's adherence to plan as established by provider  Diabetes Interventions:  (Status:  Goal on track:  Yes.) Long Term Goal MEDICATIONS: STOP Januvia   INcrease Basaglar to 32 units daily - changed to Lantus  Continue Jardiance 10 mg daily  Start Pioglitazone 15 mg daily  EDUCATION / INSTRUCTIONS: BG monitoring  instructions: Patient is instructed to check her blood sugars 1 times a day, fasting . Call [facility] if: BG persistently < 70  Assessed patient's understanding of A1c goal: <7% Review of patient status, including review of consultants reports, relevant laboratory and other test results, and medications completed Reviewed medications with patient and discussed importance of medication adherence Counseled on importance of regular laboratory monitoring as prescribed Advised patient, providing education and rationale, to continue monitoring cbg's daily before meals and at bedtime and record using her Lib[employer] freestyle scanner system, calling PCP and RN CM for findings outside established parameters Determined patient is finding her sugars to be within normal range, FBS 80-130, <180 after meals, patient denies hypoglycemic events Determined patient's activity has increased due to returning to work, 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 Kelton Pillar on 03/10/21 @10 :30 AM Discussed plans with patient for ongoing care management follow up and provided patient with direct contact information for care management team Lab Results  Component Value Date   HGBA1C 13.8 (H) 10/18/2020   Hyperlipidemia Interventions:  (Status:  Condition stable.  Not addressed this visit.) Long Term Goal Medication review performed; medication list updated in electronic medical record.  Provider established cholesterol goals reviewed Counseled on importance of regular laboratory monitoring as prescribed Provided HLD educational materials Reviewed exercise goals and target of 150 minutes per week Screening for signs and symptoms of depression related to chronic disease state Discussed plans with patient for  ongoing care management follow up and provided patient with direct contact information for care management team Lipid Panel     Component Value Date/Time   CHOL 149 12/09/2020 0856   CHOL 185 03/25/2020 1213   TRIG 174.0 (H) 12/09/2020 0856   HDL 47.50 12/09/2020 0856   HDL 40 03/25/2020 1213   CHOLHDL 3 12/09/2020 0856   VLDL 34.8 12/09/2020 0856   LDLCALC 67 12/09/2020 0856   LDLCALC 91 03/25/2020 1213   LABVLDL 54 (H) 03/25/2020 1213  Patient Goals/Self-Care Activities: Take all medications as prescribed Attend all scheduled provider appointments Call pharmacy for medication refills 3-7 days in advance of running out of medications Perform all self care activities independently  Call provider office for new concerns or questions  drink 6 to 8 glasses of water each day manage portion size take all medications exactly as prescribed call doctor with any symptoms you believe are related to your medicine call doctor when you experience any new symptoms adhere to prescribed diet: low Sodium, low fat  Follow Up Plan:  Telephone follow up appointment with care management team member scheduled for:  03/14/21       Plan:Telephone follow up appointment with care management team member scheduled for:  03/14/21  Barb Merino, RN, BSN, CCM Care Management Coordinator [facility]  Direct Phone: [phone]

## 2021-01-24 NOTE — Patient Instructions (Signed)
Visit Information  Thank you for taking time to visit with me today. Please don't hesitate to contact me if I can be of assistance to you before our next scheduled telephone appointment.  Following are the goals we discussed today:  (Copy and paste patient goals from clinical care plan here)  Our next appointment is by telephone on 03/14/21 at 12:35 PM  Please call the care guide team at [phone] if you need to cancel or reschedule your appointment.   If you are experiencing a Mental Health or Behavioral Health Crisis or need someone to talk to, please call 1-800-273-TALK (toll free, 24 hour hotline)   Patient verbalizes understanding of instructions and care plan provided today and agrees to view in MyChart. Active MyChart status confirmed with patient.    Delsa Sale, RN, BSN, CCM Care Management Coordinator [facility]  Direct Phone: [phone]

## 2021-02-03 ENCOUNTER — Telehealth: Payer: Self-pay

## 2021-02-03 NOTE — Chronic Care Management (AMB) (Signed)
Chronic Care Management Pharmacy Assistant   Name: Gabriella Acosta  MRN: [id number] DOB: 12/22/1945  Reason for Encounter: Medication Review/ Medication coordination  Recent office visits:  01-24-2021 Little, Claudette Stapler, RN (CCM)  Recent consult visits:  None  Hospital visits:  None in previous 6 months  Medications: Outpatient Encounter Medications as of 02/03/2021  Medication Sig   amLODipine (NORVASC) 5 MG tablet Take one tablet by mouth daily   atorvastatin (LIPITOR) 80 MG tablet TAKE ONE TABLET BY MOUTH AT BEDTIME   Blood Glucose Monitoring Suppl DEVI Check blood sugars twice daily. E11.9   Continuous Blood Gluc Sensor (DEXCOM G6 SENSOR) MISC 1 Device by Does not apply route as directed.   Continuous Blood Gluc Transmit (DEXCOM G6 TRANSMITTER) MISC 1 Device by Does not apply route as directed.   empagliflozin (JARDIANCE) 10 MG TABS tablet Take 1 tablet (10 mg total) by mouth daily.   icosapent Ethyl (VASCEPA) 1 g capsule Take 2 capsules (2 g total) by mouth 2 (two) times daily. (Patient not taking: Reported on 12/09/2020)   insulin glargine (LANTUS SOLOSTAR) 100 UNIT/ML Solostar Pen Inject 32 Units into the skin daily.   Insulin Pen Needle 32G X 6 MM MISC 1 each by Does not apply route in the morning, at noon, in the evening, and at bedtime.   losartan (COZAAR) 50 MG tablet Take 1 tablet (50 mg total) by mouth daily.   magnesium 30 MG tablet Take 30 mg by mouth once a week.   Multiple Vitamins-Minerals (MULTIVITAMIN WITH MINERALS) tablet Take 1 tablet by mouth daily.   niacin (NIASPAN) 500 MG CR tablet TAKE ONE TABLET BY MOUTH EVERYDAY AT BEDTIME   pioglitazone (ACTOS) 15 MG tablet Take 1 tablet (15 mg total) by mouth daily.   potassium chloride (KLOR-CON) 10 MEQ tablet Take 1 tablet (10 mEq total) by mouth daily.   No facility-administered encounter medications on file as of 02/03/2021.   Reviewed chart for medication changes ahead of medication coordination call.  No  OVs, Consults, or hospital visits since last care coordination call/Pharmacist visit.  No medication changes indicated OR if recent visit, treatment plan here.  BP Readings from Last 3 Encounters:  12/09/20 108/72  10/18/20 134/80  07/23/20 118/66    Lab Results  Component Value Date   HGBA1C 13.8 (H) 10/18/2020     Patient obtains medications through Adherence Packaging  90 Days   Last adherence delivery included:  Jardiance 10 mg at breakfast Losartan 50 mg at breakfast Atorvastatin 80 mg- 1 tablet daily (before breakfast) Potassium Chloride 10 mg- 1 tablet daily (before breakfast) Niacin Er 500 mg- 1 tablet daily (bedtime) Januvia 100 mg- 1 tablet daily (breakfast) Amlodipine 5 mg- 1 tablet daily (before breakfast)  Patient declined (meds) last month: None  Patient is due for next adherence delivery on: 02-15-2021  Called patient and reviewed medications and coordinated delivery.  This delivery to include: Jardiance 10 mg daily at breakfast Amlodipine 5 mg daily before breakfast Niacin Er 500 mg daily at bedtime Potassium Chloride 10 mg daily before breakfast Atorvastatin 80 mg daily before breakfast Losartan 50 mg daily at breakfast  No short/acute fill needed  Patient declined the following medications: Januvia 100 mg- Discontinued  Patient needs refills for: Refill request sent Potassium chloride Niacin Amlodipine  Confirmed delivery date of 02-15-2021 advised patient that pharmacy will contact them the morning of delivery.  Care Gaps: PNA Vac overdue Colonoscopy overdue Shingrix overdue Yearly ophthalmology overdue Covid booster  overdue AWV 09-21-2021  Star Rating Drugs: Jardiance 10 mg- Last filled 11-16-2020 [facility] Losartan 50 mg- Last filled 11-14-2020 [facility] Atorvastatin 80 mg- Last filled 11-09-2020 [facility]  [place] Clinical Pharmacist Assistant [phone]

## 2021-02-04 ENCOUNTER — Other Ambulatory Visit: Payer: Self-pay

## 2021-02-04 DIAGNOSIS — E876 Hypokalemia: Secondary | ICD-10-CM

## 2021-02-04 DIAGNOSIS — E781 Pure hyperglyceridemia: Secondary | ICD-10-CM

## 2021-02-04 MED ORDER — AMLODIPINE BESYLATE 5 MG PO TABS: ORAL_TABLET | ORAL | 0 refills | Status: DC

## 2021-02-04 MED ORDER — NIACIN ER (ANTIHYPERLIPIDEMIC) 500 MG PO TBCR: EXTENDED_RELEASE_TABLET | ORAL | 1 refills | Status: DC

## 2021-02-04 MED ORDER — POTASSIUM CHLORIDE ER 10 MEQ PO TBCR: 10.0000 meq | EXTENDED_RELEASE_TABLET | Freq: Every day | ORAL | 1 refills | Status: DC

## 2021-02-08 DIAGNOSIS — E782 Mixed hyperlipidemia: Secondary | ICD-10-CM

## 2021-02-08 DIAGNOSIS — E1165 Type 2 diabetes mellitus with hyperglycemia: Secondary | ICD-10-CM

## 2021-02-08 DIAGNOSIS — I10 Essential (primary) hypertension: Secondary | ICD-10-CM

## 2021-02-09 ENCOUNTER — Other Ambulatory Visit: Payer: Self-pay

## 2021-02-09 ENCOUNTER — Encounter: Payer: Self-pay | Admitting: Nurse Practitioner

## 2021-02-09 ENCOUNTER — Ambulatory Visit (INDEPENDENT_AMBULATORY_CARE_PROVIDER_SITE_OTHER): Payer: Medicare PPO | Admitting: Nurse Practitioner

## 2021-02-09 VITALS — BP 128/74 | HR 77 | Temp 98.2°F | Ht 63.6 in | Wt 126.0 lb

## 2021-02-09 DIAGNOSIS — E2839 Other primary ovarian failure: Secondary | ICD-10-CM | POA: Diagnosis not present

## 2021-02-09 DIAGNOSIS — Z1231 Encounter for screening mammogram for malignant neoplasm of breast: Secondary | ICD-10-CM | POA: Diagnosis not present

## 2021-02-09 DIAGNOSIS — Z2821 Immunization not carried out because of patient refusal: Secondary | ICD-10-CM

## 2021-02-09 DIAGNOSIS — E1165 Type 2 diabetes mellitus with hyperglycemia: Secondary | ICD-10-CM | POA: Diagnosis not present

## 2021-02-09 DIAGNOSIS — E782 Mixed hyperlipidemia: Secondary | ICD-10-CM

## 2021-02-09 DIAGNOSIS — I10 Essential (primary) hypertension: Secondary | ICD-10-CM

## 2021-02-09 MED ORDER — DEXCOM G6 SENSOR MISC: 1.0000 | 3 refills | Status: DC

## 2021-02-09 MED ORDER — DEXCOM G6 TRANSMITTER MISC: 1.0000 | 3 refills | Status: DC

## 2021-02-09 NOTE — Patient Instructions (Signed)

## 2021-02-09 NOTE — Progress Notes (Signed)
I,Katawbba Wiggins,acting as a Education administrator for [employer], FNP.,have documented all relevant documentation on the behalf of [name], FNP,as directed by  [name], FNP while in the presence of [name], [place].   This visit occurred during the SARS-CoV-2 public health emergency.  Safety protocols were in place, including screening questions prior to the visit, additional usage of staff PPE, and extensive cleaning of exam room while observing appropriate contact time as indicated for disinfecting solutions.  Subjective:     Patient ID: Gabriella Acosta , female    DOB: December 14, 1945 , 76 y.o.   MRN: [id number]   Chief Complaint  Patient presents with   Diabetes   Headache   Hyperlipidemia    HPI  Patient presents today for a f/u on her diabetes/bp. She has seen Dr. Charlett Lango in December who now has her on Lantus 32 units daily, Jardiance and pioglitazone. F/u in March 2023.  She has not checked her blood sugar in 3 weeks.   Diabetes She presents for her follow-up diabetic visit. She has type 2 diabetes mellitus. Her disease course has been worsening. Hypoglycemia symptoms include headaches. Pertinent negatives for hypoglycemia include no dizziness. Associated symptoms include fatigue and weakness (left arm). Pertinent negatives for diabetes include no blurred vision, no chest pain, no polydipsia, no polyphagia and no polyuria. There are no hypoglycemic complications. There are no diabetic complications. Risk factors for coronary artery disease include sedentary lifestyle. She is following a generally unhealthy diet. When asked about meal planning, she reported none. She has not had a previous visit with a dietitian. She never participates in exercise. (230's - 400's) An ACE inhibitor/angiotensin II receptor blocker is being taken. She does not see a podiatrist.Eye exam is not current (she has not had a diabetic eye exam).  Headache  Associated symptoms include weakness (left arm).  Pertinent negatives include no blurred vision or dizziness. Her past medical history is significant for hypertension.  Hyperlipidemia This is a chronic problem. The problem is uncontrolled. Recent lipid tests were reviewed and are high. Pertinent negatives include no chest pain.  Hypertension This is a chronic problem. The current episode started more than 1 year ago. The problem has been gradually improving since onset. Condition status: better controlled. Associated symptoms include headaches. Pertinent negatives include no blurred vision, chest pain or palpitations. Risk factors for coronary artery disease include sedentary lifestyle, dyslipidemia and diabetes mellitus. Past treatments include angiotensin blockers. There is no history of angina or kidney disease.    Past Medical History:  Diagnosis Date   Diabetes mellitus without complication ([place])    Hypertension    Type 2 diabetes mellitus ([place]) 07/22/2020     Family History  Problem Relation Age of Onset   Diabetes Mother    Diabetes Father    Heart disease Father    Breast cancer Neg Hx      Current Outpatient Medications:    amLODipine (NORVASC) 5 MG tablet, Take one tablet by mouth daily, Disp: 90 tablet, Rfl: 0   atorvastatin (LIPITOR) 80 MG tablet, TAKE ONE TABLET BY MOUTH AT BEDTIME, Disp: 90 tablet, Rfl: 1   Blood Glucose Monitoring Suppl DEVI, Check blood sugars twice daily. E11.9, Disp: 1 kit, Rfl: 0   Continuous Blood Gluc Sensor (DEXCOM G6 SENSOR) MISC, 1 Device by Does not apply route as directed., Disp: 9 each, Rfl: 3   Continuous Blood Gluc Transmit (DEXCOM G6 TRANSMITTER) MISC, 1 Device by Does not apply route as directed., Disp: 1 each,  Rfl: 3   empagliflozin (JARDIANCE) 10 MG TABS tablet, Take 1 tablet (10 mg total) by mouth daily., Disp: 90 tablet, Rfl: 1   icosapent Ethyl (VASCEPA) 1 g capsule, Take 2 capsules (2 g total) by mouth 2 (two) times daily., Disp: 120 capsule, Rfl: 3   insulin glargine (LANTUS  SOLOSTAR) 100 UNIT/ML Solostar Pen, Inject 32 Units into the skin daily., Disp: 30 mL, Rfl: 6   Insulin Pen Needle 32G X 6 MM MISC, 1 each by Does not apply route in the morning, at noon, in the evening, and at bedtime., Disp: 300 each, Rfl: 3   losartan (COZAAR) 50 MG tablet, Take 1 tablet (50 mg total) by mouth daily., Disp: 90 tablet, Rfl: 1   magnesium 30 MG tablet, Take 30 mg by mouth once a week., Disp: , Rfl:    Multiple Vitamins-Minerals (MULTIVITAMIN WITH MINERALS) tablet, Take 1 tablet by mouth daily., Disp: , Rfl:    niacin (NIASPAN) 500 MG CR tablet, TAKE ONE TABLET BY MOUTH EVERYDAY AT BEDTIME, Disp: 90 tablet, Rfl: 1   pioglitazone (ACTOS) 15 MG tablet, Take 1 tablet (15 mg total) by mouth daily., Disp: 90 tablet, Rfl: 1   potassium chloride (KLOR-CON) 10 MEQ tablet, Take 1 tablet (10 mEq total) by mouth daily., Disp: 90 tablet, Rfl: 1   No Known Allergies   Review of Systems  Constitutional:  Positive for fatigue.  Eyes:  Negative for blurred vision.  Cardiovascular:  Negative for chest pain and palpitations.  Endocrine: Negative for polydipsia, polyphagia and polyuria.  Neurological:  Positive for weakness (left arm) and headaches. Negative for dizziness.    Today's Vitals   02/09/21 1214  BP: 128/74  Pulse: 77  Temp: 98.2 F (36.8 C)  Weight: 126 lb (57.2 kg)  Height: 5' 3.6" (1.615 m)  PainSc: 0-No pain   Body mass index is 21.9 kg/m.  Wt Readings from Last 3 Encounters:  02/09/21 126 lb (57.2 kg)  12/09/20 116 lb 3.2 oz (52.7 kg)  10/18/20 116 lb 12.8 oz (53 kg)    BP Readings from Last 3 Encounters:  02/09/21 128/74  12/09/20 108/72  10/18/20 134/80    Objective:  Physical Exam Vitals reviewed.  Constitutional:      General: She is not in acute distress.    Appearance: She is well-developed.  Cardiovascular:     Heart sounds: Normal heart sounds. No murmur heard. Pulmonary:     Effort: Pulmonary effort is normal. No respiratory distress.      Breath sounds: Normal breath sounds. No wheezing.  Skin:    General: Skin is warm and dry.  Neurological:     Mental Status: She is alert.  Psychiatric:        Mood and Affect: Mood normal. Mood is not anxious.        Behavior: Behavior normal. Behavior is not agitated.        Assessment And Plan:     1. Uncontrolled type 2 diabetes mellitus with hyperglycemia (HCC) Comments: Being followed by Dr. Charlett Lango, reports she is doing well only checks BS occasionally. Awaiting Dexcom - Hemoglobin A1c  2. Essential hypertension Comments: Blood pressure is well controlled. Continue current medications  3. Mixed hyperlipidemia Comments: Cholesterol levels had improved at last visit, continue statin tolerating well.   4. Varicella zoster virus (VZV) vaccination declined  5. Pneumococcal vaccination declined  6. Encounter for screening mammogram for breast cancer Pt instructed on Self Breast Exam.According to ACOG guidelines Women aged  46 and older are recommended to get an annual mammogram. Form completed and given to patient contact the [facility] for appointment scheduing.  Pt encouraged to get annual mammogram - MM DIGITAL SCREENING BILATERAL; Future  7. Decreased estrogen level - DG Bone Density; Future     Patient was given opportunity to ask questions. Patient verbalized understanding of the plan and was able to repeat key elements of the plan. All questions were answered to their satisfaction.  [name], FNP   I, [name], FNP, have reviewed all documentation for this visit. The documentation on 02/09/21 for the exam, diagnosis, procedures, and orders are all accurate and complete.   IF YOU HAVE BEEN REFERRED TO A SPECIALIST, IT MAY TAKE 1-2 WEEKS TO SCHEDULE/PROCESS THE REFERRAL. IF YOU HAVE NOT HEARD FROM US/SPECIALIST IN TWO WEEKS, PLEASE GIVE [place] A CALL AT [phone].   THE PATIENT IS ENCOURAGED TO PRACTICE SOCIAL DISTANCING DUE TO THE COVID-19  PANDEMIC.

## 2021-02-10 LAB — HEMOGLOBIN A1C
Est. average glucose Bld gHb Est-mCnc: 226 mg/dL
Hgb A1c MFr Bld: 9.5 % — ABNORMAL HIGH (ref 4.8–5.6)

## 2021-02-25 ENCOUNTER — Telehealth: Payer: Self-pay

## 2021-02-25 NOTE — Chronic Care Management (AMB) (Signed)
° ° °  Chronic Care Management Pharmacy Assistant   Name: Gabriella Acosta  MRN: [id number] DOB: September 02, 1945   Reason for Encounter: Benjie Karvonen follow up   Medications: Outpatient Encounter Medications as of 02/25/2021  Medication Sig   amLODipine (NORVASC) 5 MG tablet Take one tablet by mouth daily   atorvastatin (LIPITOR) 80 MG tablet TAKE ONE TABLET BY MOUTH AT BEDTIME   Blood Glucose Monitoring Suppl DEVI Check blood sugars twice daily. E11.9   Continuous Blood Gluc Sensor (DEXCOM G6 SENSOR) MISC 1 Device by Does not apply route as directed.   Continuous Blood Gluc Transmit (DEXCOM G6 TRANSMITTER) MISC 1 Device by Does not apply route as directed.   empagliflozin (JARDIANCE) 10 MG TABS tablet Take 1 tablet (10 mg total) by mouth daily.   icosapent Ethyl (VASCEPA) 1 g capsule Take 2 capsules (2 g total) by mouth 2 (two) times daily.   insulin glargine (LANTUS SOLOSTAR) 100 UNIT/ML Solostar Pen Inject 32 Units into the skin daily.   Insulin Pen Needle 32G X 6 MM MISC 1 each by Does not apply route in the morning, at noon, in the evening, and at bedtime.   losartan (COZAAR) 50 MG tablet Take 1 tablet (50 mg total) by mouth daily.   magnesium 30 MG tablet Take 30 mg by mouth once a week.   Multiple Vitamins-Minerals (MULTIVITAMIN WITH MINERALS) tablet Take 1 tablet by mouth daily.   niacin (NIASPAN) 500 MG CR tablet TAKE ONE TABLET BY MOUTH EVERYDAY AT BEDTIME   pioglitazone (ACTOS) 15 MG tablet Take 1 tablet (15 mg total) by mouth daily.   potassium chloride (KLOR-CON) 10 MEQ tablet Take 1 tablet (10 mEq total) by mouth daily.   No facility-administered encounter medications on file as of 02/25/2021.   02-25-2021: Tried contacting [facility] to follow up on patient's Dexcom but was on hold for 10 minutes. Instructed patient to try contacting pharmacy and follow up with me.  [place] Pharmacist Assistant [phone]

## 2021-03-04 ENCOUNTER — Ambulatory Visit
Admission: RE | Admit: 2021-03-04 | Discharge: 2021-03-04 | Disposition: A | Discharge status: Home / Self Care | Payer: Medicare PPO | Source: Ambulatory Visit | Attending: Nurse Practitioner | Admitting: Nurse Practitioner

## 2021-03-04 ENCOUNTER — Other Ambulatory Visit: Payer: Self-pay

## 2021-03-04 DIAGNOSIS — Z1231 Encounter for screening mammogram for malignant neoplasm of breast: Secondary | ICD-10-CM

## 2021-03-10 ENCOUNTER — Ambulatory Visit: Payer: Medicare PPO | Admitting: Internal Medicine

## 2021-03-10 ENCOUNTER — Encounter: Payer: Self-pay | Admitting: Internal Medicine

## 2021-03-10 ENCOUNTER — Other Ambulatory Visit: Payer: Self-pay

## 2021-03-10 VITALS — BP 130/74 | HR 88 | Ht 63.0 in | Wt 118.0 lb

## 2021-03-10 DIAGNOSIS — E785 Hyperlipidemia, unspecified: Secondary | ICD-10-CM

## 2021-03-10 DIAGNOSIS — R739 Hyperglycemia, unspecified: Secondary | ICD-10-CM

## 2021-03-10 DIAGNOSIS — E1165 Type 2 diabetes mellitus with hyperglycemia: Secondary | ICD-10-CM | POA: Diagnosis not present

## 2021-03-10 LAB — POCT GLUCOSE (DEVICE FOR HOME USE): Glucose Fasting, POC: 194 mg/dL — AB (ref 70–99)

## 2021-03-10 MED ORDER — PIOGLITAZONE HCL 15 MG PO TABS
15.0000 mg | ORAL_TABLET | Freq: Every day | ORAL | 3 refills | Status: DC
Start: 2021-03-10 — End: 2021-06-23

## 2021-03-10 MED ORDER — LANTUS SOLOSTAR 100 UNIT/ML ~~LOC~~ SOPN: 35.0000 [IU] | PEN_INJECTOR | Freq: Every day | SUBCUTANEOUS | 6 refills | Status: DC

## 2021-03-10 MED ORDER — EMPAGLIFLOZIN 25 MG PO TABS: 25.0000 mg | ORAL_TABLET | Freq: Every day | ORAL | 3 refills | Status: DC
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
## 2021-03-10 NOTE — Patient Instructions (Signed)
?-   INcrease Lantus  to 35 units daily  ?- Increase Jardiance 25 mg daily  ?- Continue Pioglitazone 15 mg daily  ? ? ? ? ? ? ? ? ? ?HOW TO TREAT LOW BLOOD SUGARS (Blood sugar LESS THAN 70 MG/DL) ?Please follow the RULE OF 15 for the treatment of hypoglycemia treatment (when your (blood sugars are less than 70 mg/dL)  ? ?STEP 1: Take 15 grams of carbohydrates when your blood sugar is low, which includes:  ?3-4 GLUCOSE TABS  OR ?3-4 OZ OF JUICE OR REGULAR SODA OR ?ONE TUBE OF GLUCOSE GEL   ? ?STEP 2: RECHECK blood sugar in 15 MINUTES ?STEP 3: If your blood sugar is still low at the 15 minute recheck --> then, go back to STEP 1 and treat AGAIN with another 15 grams of carbohydrates. ? ?

## 2021-03-14 ENCOUNTER — Other Ambulatory Visit: Payer: Self-pay

## 2021-03-14 ENCOUNTER — Telehealth: Payer: Medicare PPO

## 2021-03-14 NOTE — Telephone Encounter (Signed)
?  Care Management  ? ?Follow Up Note ? ? ?03/14/2021 ?Name: Gabriella Acosta MRN: [id number] DOB: 12-12-1945 ? ? ?Referred by: [name], FNP ?Reason for referral : Chronic Care Management (RN CM Follow up call ) ? ? ?An unsuccessful telephone outreach was attempted today. The patient was referred to the case management team for assistance with care management and care coordination.  ? ?Follow Up Plan: Telephone follow up appointment with care management team member scheduled for: 03/17/21 ? ?Delsa Sale, RN, BSN, CCM ?Care Management Coordinator ?[facility] Care Management/[facility]  ?Direct Phone: [phone]' ? ? ?

## 2021-03-16 ENCOUNTER — Other Ambulatory Visit: Payer: Self-pay

## 2021-03-16 ENCOUNTER — Encounter (HOSPITAL_COMMUNITY): Payer: Self-pay | Admitting: Emergency Medicine

## 2021-03-16 ENCOUNTER — Ambulatory Visit (HOSPITAL_COMMUNITY)
Admission: EM | Admit: 2021-03-16 | Discharge: 2021-03-16 | Disposition: A | Discharge status: Home / Self Care | Payer: Medicare PPO | Attending: Family Medicine | Admitting: Family Medicine

## 2021-03-16 ENCOUNTER — Other Ambulatory Visit: Payer: Self-pay | Admitting: Internal Medicine

## 2021-03-16 DIAGNOSIS — B029 Zoster without complications: Secondary | ICD-10-CM

## 2021-03-16 MED ORDER — VALACYCLOVIR HCL 1 G PO TABS: 1000.0000 mg | ORAL_TABLET | Freq: Three times a day (TID) | ORAL | 0 refills | Status: AC

## 2021-03-16 MED ORDER — PREDNISONE 20 MG PO TABS
20.0000 mg | ORAL_TABLET | Freq: Every day | ORAL | 0 refills | Status: AC
Start: 2021-03-16 — End: 2021-03-21

## 2021-03-16 NOTE — ED Triage Notes (Signed)
On Sunday started having left side pain, then noticed bumps on left torso.  Blistery, painful rash to left torso ?
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
## 2021-03-16 NOTE — Discharge Instructions (Addendum)
Take medication as prescribed. ?Apply cool coughs to the affected area for pain or discomfort. ?The rash covered with a loose bandage to prevent spread.  Avoid contact with others if the blisters are oozing. ?May use Aveeno Colloidal oatmeal bath as needed. ?Follow-up with your primary care physician if symptoms do not improve. ? ?

## 2021-03-17 ENCOUNTER — Ambulatory Visit (INDEPENDENT_AMBULATORY_CARE_PROVIDER_SITE_OTHER): Payer: Medicare PPO

## 2021-03-17 ENCOUNTER — Telehealth: Payer: Medicare PPO

## 2021-03-17 DIAGNOSIS — Z720 Tobacco use: Secondary | ICD-10-CM

## 2021-03-17 DIAGNOSIS — E782 Mixed hyperlipidemia: Secondary | ICD-10-CM

## 2021-03-17 DIAGNOSIS — E1165 Type 2 diabetes mellitus with hyperglycemia: Secondary | ICD-10-CM

## 2021-03-17 DIAGNOSIS — I10 Essential (primary) hypertension: Secondary | ICD-10-CM

## 2021-03-18 NOTE — Patient Instructions (Signed)
Visit Information ? ?Thank you for taking time to visit with me today. Please don't hesitate to contact me if I can be of assistance to you before our next scheduled telephone appointment. ? ?Following are the goals we discussed today:  ?(Copy and paste patient goals from clinical care plan here) ? ?Our next appointment is by telephone on 05/17/21 at 10:30 AM  ? ?Please call the care guide team at [phone] if you need to cancel or reschedule your appointment.  ? ?If you are experiencing a Mental Health or Behavioral Health Crisis or need someone to talk to, please call 1-800-273-TALK (toll free, 24 hour hotline)  ? ?Patient verbalizes understanding of instructions and care plan provided today and agrees to view in MyChart. Active MyChart status confirmed with patient.   ? ?Delsa Sale, RN, BSN, CCM ?Care Management Coordinator ?[facility] Care Management/[facility]  ?Direct Phone: [phone] ? ? ?

## 2021-03-18 NOTE — Chronic Care Management (AMB) (Cosign Needed)
Chronic Care Management   CCM RN Visit Note  03/17/2021 Name: Gabriella Acosta MRN: [id number] DOB: 1945-04-24  Subjective: Gabriella Acosta is a 76 y.o. year old female who is a primary care patient of [name], FNP. The care management team was consulted for assistance with disease management and care coordination needs.    Engaged with patient by telephone for follow up visit in response to provider referral for case management and/or care coordination services.   Consent to Services:  The patient was given information about Chronic Care Management services, agreed to services, and gave verbal consent prior to initiation of services.  Please see initial visit note for detailed documentation.   Patient agreed to services and verbal consent obtained.   Assessment: Review of patient past medical history, allergies, medications, health status, including review of consultants reports, laboratory and other test data, was performed as part of comprehensive evaluation and provision of chronic care management services.   SDOH (Social Determinants of Health) assessments and interventions performed:  Yes, no acute challenges   CCM Care Plan  No Known Allergies  Outpatient Encounter Medications as of 03/17/2021  Medication Sig   amLODipine (NORVASC) 5 MG tablet Take one tablet by mouth daily   atorvastatin (LIPITOR) 80 MG tablet TAKE ONE TABLET BY MOUTH AT BEDTIME   Blood Glucose Monitoring Suppl DEVI Check blood sugars twice daily. E11.9 (Patient not taking: Reported on 03/10/2021)   empagliflozin (JARDIANCE) 25 MG TABS tablet Take 1 tablet (25 mg total) by mouth daily before breakfast.   insulin glargine (LANTUS SOLOSTAR) 100 UNIT/ML Solostar Pen Inject 35 Units into the skin daily.   Insulin Pen Needle 32G X 6 MM MISC 1 each by Does not apply route in the morning, at noon, in the evening, and at bedtime.   losartan (COZAAR) 50 MG tablet Take 1 tablet (50 mg total) by mouth daily.    magnesium 30 MG tablet Take 30 mg by mouth once a week.   Multiple Vitamins-Minerals (MULTIVITAMIN WITH MINERALS) tablet Take 1 tablet by mouth daily.   niacin (NIASPAN) 500 MG CR tablet TAKE ONE TABLET BY MOUTH EVERYDAY AT BEDTIME   pioglitazone (ACTOS) 15 MG tablet Take 1 tablet (15 mg total) by mouth daily.   potassium chloride (KLOR-CON) 10 MEQ tablet Take 1 tablet (10 mEq total) by mouth daily.   predniSONE (DELTASONE) 20 MG tablet Take 1 tablet (20 mg total) by mouth daily with breakfast for 5 days.   valACYclovir (VALTREX) 1000 MG tablet Take 1 tablet (1,000 mg total) by mouth 3 (three) times daily for 7 days.   VASCEPA 1 g capsule TAKE 2 CAPSULES(2 GRAMS) BY MOUTH TWICE DAILY   No facility-administered encounter medications on file as of 03/17/2021.    Patient Active Problem List   Diagnosis Date Noted   Dyslipidemia 12/09/2020   Other insomnia 10/18/2020   Uncontrolled type 2 diabetes mellitus with hyperglycemia (HCC) 10/18/2020   Tobacco abuse 07/22/2020   Type 2 diabetes mellitus (HCC) 07/22/2020   Cognitive deficits 11/07/2018   ETOH abuse 07/30/2013   Anxiety 07/30/2013   Essential hypertension 01/31/2011    Conditions to be addressed/monitored: DM II, HTN, Mixed hyperlipidemia, Tobacco Use   Care Plan : RN Care Manager Plan of Care  Updates made by Riley Churches, RN since 03/18/2021 12:00 AM     Problem: No plan of care established for management of chronic disease states (DM II, HTN, Mixed hyperlipidemia, Tobacco Use)   Priority: High  Long-Range Goal: Development of plan of care for chronic disease management for DM II, HTN, Mixed hyperlipidemia, Tobacco Use   Start Date: 12/13/2020  Expected End Date: 12/13/2021  Recent Progress: On track  Priority: High  Note:   Current Barriers:  Knowledge Deficits related to plan of care for management of DM II, HTN, Mixed hyperlipidemia, Tobacco Use   Chronic Disease Management support and education needs related to DM  II, HTN, Mixed hyperlipidemia, Tobacco Use   Financial Restraints   RNCM Clinical Goal(s):  Patient will verbalize basic understanding of  DM II, HTN, Mixed hyperlipidemia, Tobacco Use  disease process and self health management plan as evidenced by patient will report having no disease exacerbations related to her chronic disease states listed above  take all medications exactly as prescribed and will call provider for medication related questions as evidenced by patient will report having no missed doses of her prescribed medications demonstrate Improved health management independence as evidenced by patient will report 100% adherence to following her prescribed treatment plan as directed  continue to work with RN Care Manager to address care management and care coordination needs related to  DM II, HTN, Mixed hyperlipidemia, Tobacco Use  as evidenced by adherence to CM Team Scheduled appointments demonstrate ongoing self health care management ability   as evidenced by    through collaboration with RN Care manager, provider, and care team.   Interventions: 1:1 collaboration with primary care provider regarding development and update of comprehensive plan of care as evidenced by provider attestation and co-signature Inter-disciplinary care team collaboration (see longitudinal plan of care) Evaluation of current treatment plan related to  self management and patient's adherence to plan as established by provider  Diabetes Interventions:  (Status:  Goal on track:  Yes.) Long Term Goal Assessed patient's understanding of A1c goal: <7% Review of patient status, including review of consultant's reports, relevant laboratory and other test results, and medications completed Reviewed medications with patient and discussed importance of medication adherence Reviewed and discussed recent Endo follow with completed with Dr. Lonzo Cloud on 03/10/21 with the following Assessment/Plan noted:  1) Type 2 Diabetes  Mellitus, with improving glycemic control Without complications - Most recent A1c of 9.5 %. Goal A1c < 7.0 %.   - A1c down from 13.8%  - I suspect this is due to pancreatic dysfunction given hx of pancreatitis and ETOH abuse  - She is unable to take prandial insulin, pt states her working schedule interferes with this  MEDICATIONS: - INcrease Lantus to 35 units daily  - Increase Jardiance 25 mg daily  -Continue pioglitazone 15 mg daily  EDUCATION / INSTRUCTIONS: BG monitoring instructions: Patient is instructed to check her blood sugars 1 times a day, fasting . Call [facility] if: BG persistently < 70  I reviewed the Rule of 15 for the treatment of hypoglycemia in detail with the patient. Literature supplied. 2) Diabetic complications:  Eye: Does not have known diabetic retinopathy.  Neuro/ Feet: Does not have known diabetic peripheral neuropathy. Renal: Patient does not have known baseline CKD. She is  on an ACEI/ARB at present. 3) Dyslipidemia  - LDL is at goal  - Tg elevated, will monitor  Continue Atorvastatin 80 mg daily  Advised patient, providing education and rationale, to check cbg once daily before meals per Dr. Lonzo Cloud and record, calling Dr. Lonzo Cloud for findings outside established parameters Educated patient and provided rationale on importance of close monitoring of CBG's while taking insulin for Shingles, calling  Dr. Lonzo CloudShamleffer for persistent CBG's >250 Reinforced importance of adhering to dietary and exercise recommendation  Mailed printed educational materials related to Carb Choices; The Dangers in Skipping Meals Lab Results  Component Value Date   HGBA1C 9.5 (H) 02/09/2021    Hyperlipidemia Interventions:  (Status:  Goal on track. Yes) Long Term Goal Review of patient status, including review of consultant's reports, relevant laboratory and other test results, and medications completed  Provider established cholesterol goals reviewed Reviewed  exercise goals and target of 150 minutes per week Educated on dietary recommendations for eating a low Saturated and low Trans fat diet  Mailed printed educational materials related to [employer]; Full Body Workout with 6 Chair Exerices Discussed plans with patient for ongoing care management follow up and provided patient with direct contact information for care management team Lipid Panel     Component Value Date/Time   CHOL 149 12/09/2020 0856   CHOL 185 03/25/2020 1213   TRIG 174.0 (H) 12/09/2020 0856   HDL 47.50 12/09/2020 0856   HDL 40 03/25/2020 1213   CHOLHDL 3 12/09/2020 0856   VLDL 34.8 12/09/2020 0856   LDLCALC 67 12/09/2020 0856   LDLCALC 91 03/25/2020 1213   LABVLDL 54 (H) 03/25/2020 1213   Herpes Zoster without complications Interventions:  (Status:  New goal.)  Short Term Goal Evaluation of current treatment plan related to  Shingles outbreak ,  self-management and patient's adherence to plan as established by provider Determined [facility] for diagnosis of Herpes Zoster Educated patient on basic disease process related to Herpes Zoster, including treatment, potential complications and when to seek medical attention if complications occur Reviewed and discussed the following Assessment/Plan for this condition: Symptoms are consistent with a herpes zoster rash.  Patient's symptoms developed approximately 3 days ago along the left torso with a dermatomal distribution.  The rash presents in a dermatomal distribution along the left torso.  We will prescribe the patient valacyclovir for her symptoms.  We will also prescribe prednisone to help with inflammation and itching.  Patient encouraged to avoid scratching or irritating the area, keep the area loosely covered, and avoid contact if the blisters are oozing.  Follow-up with your primary care if symptoms do not improve. Reviewed medications with patient and discussed importance of  medication adherence, educated on potential increase in blood sugars while taking Prednisone, instructed patient to monitor her sugars more closely and to report persistent readings >250 to Dr. Lonzo CloudShamleffer Educated patient on cdc recommendations for Shingrix vaccinations, encouraged patient to discuss further with embedded pharm D during next scheduled call  Reviewed scheduled/upcoming provider appointments including: Pharm D telephonic visit scheduled for 03/25/21 @11 :00 AM Discussed plans with patient for ongoing care management follow up and provided patient with direct contact information for care management team  Patient Goals/Self-Care Activities: Take all medications as prescribed Attend all scheduled provider appointments Call pharmacy for medication refills 3-7 days in advance of running out of medications Perform all self care activities independently  Call provider office for new concerns or questions  drink 6 to 8 glasses of water each day manage portion size take all medications exactly as prescribed call doctor with any symptoms you believe are related to your medicine call doctor when you experience any new symptoms adhere to prescribed diet: low Sodium, low fat  Follow Up Plan:  Telephone follow up appointment with care management team member scheduled for:  05/17/21  Delsa Sale, RN, BSN, CCM Care Management Coordinator [facility]  Direct Phone: [phone]

## 2021-03-22 ENCOUNTER — Telehealth: Payer: Self-pay

## 2021-03-22 NOTE — Chronic Care Management (AMB) (Signed)
? ? ? ?  Gabriella Acosta was reminded to have all medications, supplements and any blood glucose and blood pressure readings available for review with [name], Pharm. D, at her telephone visit on 03-25-2021 at 11:00. ? ? ?[name] CMA ?Clinical Pharmacist Assistant ?[phone] ? ? ?

## 2021-03-25 ENCOUNTER — Telehealth: Payer: Medicare PPO

## 2021-03-25 ENCOUNTER — Telehealth: Payer: Self-pay

## 2021-03-25 NOTE — Chronic Care Management (AMB) (Signed)
03-17-2023[name] CPP requested patient to reschedule today's appointment to April since patient had a visit with CCM RN. Patient was very understanding and rescheduled to April. ? ?Gabriella Acosta CMA ?Clinical Pharmacist Assistant ?[phone] ? ?

## 2021-04-08 DIAGNOSIS — E1165 Type 2 diabetes mellitus with hyperglycemia: Secondary | ICD-10-CM | POA: Diagnosis not present

## 2021-04-08 DIAGNOSIS — E782 Mixed hyperlipidemia: Secondary | ICD-10-CM

## 2021-04-19 ENCOUNTER — Telehealth: Payer: Self-pay

## 2021-04-19 NOTE — Chronic Care Management (AMB) (Signed)
? ?  Gabriella Acosta was reminded to have all medications, supplements and any blood glucose and blood pressure readings available for review with Gabriella Acosta, Gabriella Acosta, at her telephone visit on 04-22-2021 at 12:00. ? ? ?Gabriella Acosta CMA ?Clinical Pharmacist Assistant ?[phone] ? ? ?

## 2021-04-22 ENCOUNTER — Ambulatory Visit (INDEPENDENT_AMBULATORY_CARE_PROVIDER_SITE_OTHER): Payer: Medicare PPO

## 2021-04-22 DIAGNOSIS — I10 Essential (primary) hypertension: Secondary | ICD-10-CM

## 2021-04-22 DIAGNOSIS — E1165 Type 2 diabetes mellitus with hyperglycemia: Secondary | ICD-10-CM

## 2021-04-22 NOTE — Progress Notes (Addendum)
? ?Chronic Care Management ?Pharmacy Note ? ?05/04/2021 ?Name:  Gabriella Acosta MRN:  [id number] DOB:  [birth year] ? ?Summary: ?Patient reports that she is taking her medication daily.  ? ?Recommendations/Changes made from today's visit: ?Recommend patient receive the Shingrix Vaccine in May ?Recommend patient receive colonoscopy  ?Recommend patient begin using Freestyle Libre 2 for checking blood sugar ?Recommend patients Losartan be increased to 100 mg tablet daily, with chemistry to be completed in two weeks.  ?Let [name] know about her upcoming appointment with [name] in June, patient reports that she will be out of town.  ? ?Plan: ?Patient reports that she is going to receive her Shingrix Vaccine in may.  ?I contacted PCP team to inform that [name] will be out of town in June and would like her PCP visit in May, sent secure message.  ?Collaborate with PCP team to help patient apply for CGM system.  ? ? ?Subjective: ?Gabriella Acosta is an 76 y.o. year old female who is a primary patient of [name], [place].  The CCM team was consulted for assistance with disease management and care coordination needs.   ? ?Engaged with patient by telephone for follow up visit in response to provider referral for pharmacy case management and/or care coordination services.  ? ?Consent to Services:  ?The patient was given information about Chronic Care Management services, agreed to services, and gave verbal consent prior to initiation of services.  Please see initial visit note for detailed documentation.  ? ?Patient Care Team: ?[name], FNP as PCP - General (General Practice) ?Mayford Knife, RPH (Pharmacist) ?Daneen Schick as [place] Management ? ?Recent office visits: ?02/09/2021 PCP [facility] ? ?Recent consult visits: ?03/10/2021 Endocrinology [facility] ?03/04/2021 Mammogram screening  ? ?Hospital visits: ?None in previous 6 months ? ? ?Objective: ? ?Lab Results  ?Component Value Date  ? CREATININE  0.70 12/09/2020  ? BUN 10 12/09/2020  ? GFR 85.51 12/09/2020  ? EGFR 92 03/25/2020  ? GFRNONAA >60 05/26/2020  ? GFRAA 102 10/20/2019  ? NA 142 12/09/2020  ? K 4.6 12/09/2020  ? CALCIUM 10.7 (H) 12/09/2020  ? CO2 30 12/09/2020  ? GLUCOSE 277 (H) 12/09/2020  ? ? ?Lab Results  ?Component Value Date/Time  ? HGBA1C 9.5 (H) 02/09/2021 12:52 PM  ? HGBA1C 13.8 (H) 10/18/2020 10:21 AM  ? GFR 85.51 12/09/2020 08:56 AM  ? MICROALBUR <0.7 12/09/2020 08:56 AM  ? MICROALBUR 30 12/10/2018 05:09 PM  ? MICROALBUR 150 10/31/2017 12:47 PM  ?  ?Last diabetic Eye exam:  ?Lab Results  ?Component Value Date/Time  ? HMDIABEYEEXA No Retinopathy 10/02/2019 12:00 AM  ?  ?Last diabetic Foot exam: No results found for: HMDIABFOOTEX  ? ?Lab Results  ?Component Value Date  ? CHOL 149 12/09/2020  ? HDL 47.50 12/09/2020  ? [place] 67 12/09/2020  ? TRIG 174.0 (H) 12/09/2020  ? CHOLHDL 3 12/09/2020  ? ? ? ?  Latest Ref Rng & Units 05/26/2020  ?  3:17 PM 03/25/2020  ? 12:13 PM 10/20/2019  ? 10:22 AM  ?Hepatic Function  ?Total Protein 6.5 - 8.1 g/dL 7.8   7.4   7.7    ?Albumin 3.5 - 5.0 g/dL 4.0   4.6   4.5    ?AST 15 - 41 U/L '26   30   18    ' ?ALT 0 - 44 U/L 21   39   21    ?Alk Phosphatase 38 - 126 U/L 122  161   185    ?Total Bilirubin 0.3 - 1.2 mg/dL 0.9   0.3   0.3    ? ? ?Lab Results  ?Component Value Date/Time  ? TSH 1.240 10/18/2020 10:21 AM  ? TSH 1.650 10/20/2019 10:22 AM  ? ? ? ?  Latest Ref Rng & Units 05/26/2020  ?  3:17 PM 10/20/2019  ? 10:22 AM 07/30/2019  ?  3:33 PM  ?CBC  ?WBC 4.0 - 10.5 K/uL 10.6   12.8   9.9    ?Hemoglobin 12.0 - 15.0 g/dL 13.4   13.5   12.6    ?Hematocrit 36.0 - 46.0 % 40.5   42.2   37.7    ?Platelets 150 - 400 K/uL 120   132   92    ? ? ?No results found for: VD25OH ? ?Clinical ASCVD: No  ?The 10-year ASCVD risk score (Arnett DK, et al., 2019) is: 54.5% ?  Values used to calculate the score: ?    Age: 52 years ?    Sex: Female ?    Is Non-Hispanic African American: Yes ?    Diabetic: Yes ?    Tobacco smoker: Yes ?     Systolic Blood Pressure: 563 mmHg ?    Is BP treated: Yes ?    HDL Cholesterol: 47.5 mg/dL ?    Total Cholesterol: 149 mg/dL   ? ? ?  08/12/2020  ? 10:42 AM 07/30/2019  ?  2:16 PM 02/04/2019  ?  2:14 PM  ?Depression screen PHQ 2/9  ?Decreased Interest 0 0 0  ?Down, Depressed, Hopeless 0 0 0  ?PHQ - 2 Score 0 0 0  ?  ? ? ?Social History  ? ?Tobacco Use  ?Smoking Status Every Day  ? Packs/day: 0.25  ? Types: Cigarettes  ?Smokeless Tobacco Current  ? ?BP Readings from Last 3 Encounters:  ?03/16/21 (!) 161/81  ?03/10/21 130/74  ?02/09/21 128/74  ? ?Pulse Readings from Last 3 Encounters:  ?03/16/21 81  ?03/10/21 88  ?02/09/21 77  ? ?Wt Readings from Last 3 Encounters:  ?03/10/21 118 lb (53.5 kg)  ?02/09/21 126 lb (57.2 kg)  ?12/09/20 116 lb 3.2 oz (52.7 kg)  ? ?BMI Readings from Last 3 Encounters:  ?03/10/21 20.90 kg/m?  ?02/09/21 21.90 kg/m?  ?12/09/20 20.20 kg/m?  ? ? ?Assessment/Interventions: Review of patient past medical history, allergies, medications, health status, including review of consultants reports, laboratory and other test data, was performed as part of comprehensive evaluation and provision of chronic care management services.  ? ?SDOH:  (Social Determinants of Health) assessments and interventions performed: No ? ?SDOH Screenings  ? ?Alcohol Screen: Low Risk   ? Last Alcohol Screening Score (AUDIT): 0  ?Depression (PHQ2-9): Low Risk   ? PHQ-2 Score: 0  ?Financial Resource Strain: High Risk  ? Difficulty of Paying Living Expenses: Hard  ?Food Insecurity: Food Insecurity Present  ? Worried About Charity fundraiser in the Last Year: Often true  ? Ran Out of Food in the Last Year: Often true  ?Housing: Not on file  ?Physical Activity: Inactive  ? Days of Exercise per Week: 0 days  ? Minutes of Exercise per Session: 0 min  ?Social Connections: Not on file  ?Stress: No Stress Concern Present  ? Feeling of Stress : Not at all  ?Tobacco Use: High Risk  ? Smoking Tobacco Use: Every Day  ? Smokeless Tobacco Use:  Current  ? Passive Exposure: Not on file  ?Transportation Needs:  No Transportation Needs  ? Lack of Transportation (Medical): No  ? Lack of Transportation (Non-Medical): No  ? ? ?CCM Care Plan ? ?No Known Allergies ? ?Medications Reviewed Today   ? ? Reviewed by [name], RN (Registered Nurse) on 03/17/21 at 85  Med List Status: <None>  ? ?Medication Order Taking? Sig Documenting Provider Last Dose Status Informant  ?amLODipine (NORVASC) 5 MG tablet [id number] No Take one tablet by mouth daily [name], FNP Taking Active   ?atorvastatin (LIPITOR) 80 MG tablet [id number] No TAKE ONE TABLET BY MOUTH AT BEDTIME [name], FNP Taking Active   ?Blood Glucose Monitoring Suppl DEVI [id number] No Check blood sugars twice daily. E11.9  ?Patient not taking: Reported on 03/10/2021  ? [name], FNP Not Taking Active Self  ?empagliflozin (JARDIANCE) 25 MG TABS tablet [id number]  Take 1 tablet (25 mg total) by mouth daily before breakfast. Shamleffer, Melanie Crazier, MD  Active   ?insulin glargine (LANTUS SOLOSTAR) 100 UNIT/ML Solostar Pen [id number]  Inject 35 Units into the skin daily. Shamleffer, Melanie Crazier, MD  Active   ?Insulin Pen Needle 32G X 6 MM MISC [id number] No 1 each by Does not apply route in the morning, at noon, in the evening, and at bedtime. [name], FNP Taking Active   ?losartan (COZAAR) 50 MG tablet [id number] No Take 1 tablet (50 mg total) by mouth daily. [name], FNP Taking Active   ?magnesium 30 MG tablet [id number] No Take 30 mg by mouth once a week. [provider] Taking Active Self  ?Multiple Vitamins-Minerals (MULTIVITAMIN WITH MINERALS) tablet [id number] No Take 1 tablet by mouth daily. [provider] Taking Active Self  ?niacin (NIASPAN) 500 MG CR tablet [id number] No TAKE ONE TABLET BY MOUTH EVERYDAY AT BEDTIME [name], FNP Taking Active   ?pioglitazone (ACTOS) 15 MG tablet [id number]  Take 1 tablet (15 mg total) by mouth daily. Shamleffer,  Melanie Crazier, MD  Active   ?potassium chloride (KLOR-CON) 10 MEQ tablet [id number] No Take 1 tablet (10 mEq total) by mouth daily. [name], FNP Taking Active   ?predniSONE (DELTASONE) 20 MG tablet [phone]

## 2021-05-04 ENCOUNTER — Telehealth: Payer: Self-pay

## 2021-05-04 NOTE — Patient Instructions (Addendum)
Visit Information ?It was great speaking with you today!  Please let me know if you have any questions about our visit. ? ? Goals Addressed   ? ?  ?  ?  ?  ? This Visit's Progress  ?  Manage My Medicine     ?  Timeframe:  Long-Range Goal ?Priority:  High ?Start Date: 05/05/2020                            ?Expected End Date: 10/2021                    ? ?Follow Up Date:07/15/2021 ? ?In Progress:  ?- call for medicine refill 2 or 3 days before it runs out ?- keep a list of all the medicines I take; vitamins and herbals too ?- use a pillbox to sort medicine ?- use an alarm clock or phone to remind me to take my medicine  ?  ?Why is this important?   ?These steps will help you keep on track with your medicines. ?  ? ?  ? ?  ? ?Patient Care Plan: CCM Pharmacy Care Plan  ?  ? ?Problem Identified: HLD, DM   ?Priority: High  ?  ? ?Long-Range Goal: Disease Management   ?Recent Progress: On track  ?Priority: High  ?Note:   ? ? ?Current Barriers:  ?Unable to independently monitor therapeutic efficacy ? ?Pharmacist Clinical Goal(s):  ?Patient will achieve adherence to monitoring guidelines and medication adherence to achieve therapeutic efficacy through collaboration with PharmD and provider.  ? ?Interventions: ?1:1 collaboration with [name], FNP regarding development and update of comprehensive plan of care as evidenced by provider attestation and co-signature ?Inter-disciplinary care team collaboration (see longitudinal plan of care) ?Comprehensive medication review performed; medication list updated in electronic medical record ? ?Hypertension (BP goal <130/80) ?-Uncontrolled ?-Current treatment: ?Amlodipine 5 mg tablet once per day Appropriate, Effective, Safe, Accessible ?Losartan 50 mg tablet daily  Appropriate, Query Effective ?-Current home readings: she is not currently checking her BP at home  ?-Current dietary habits: she is no using salt on her food  ?-Current exercise habits: she reports that she is not  exercising at this time  ?-Denies hypotensive/hypertensive symptoms ?-Educated on Importance of home blood pressure monitoring; ?Proper BP monitoring technique; ?-Counseled to monitor BP at home at least twice per week, document, and provide log at future appointments ?-Recommended to continue current medication ?Collaborated with PCP team and recommend patients Losartan be increased to 100 mg tablet daily, with chemistry panel to be completed in 2 weeks, this can be done possibly during next office visit.  ? ?Diabetes (A1c goal <7%) ?-Uncontrolled ?-Current medications: ?Jardiance 25 mg tablet once per day  ?Lantus 35 units daily  ?Pioglitazone 15 once daily  ?-Current home glucose readings: patient reports that she is not checking her BS ?-Denies hypoglycemic/hyperglycemic symptoms ?-Current meal patterns: it varies   ?breakfast:  bowl of grits, toast and jelly, sometimes bacon and eggs  ?lunch: burger or fish  ?dinner: sometimes she has dinner it depends  ?snacks: rarely eats any snacks  ?drinks: beverages include tea, and water all day long. She is drinking gallons of water  ?-Current exercise: will discuss further during next office visit  ?-Educated on Benefits of routine self-monitoring of blood sugar; ?Continuous glucose monitoring; ?-Counseled to check feet daily and get yearly eye exams ?-Congratulated patient on her decrease in A1c.  ?-She was interested in  having a new meter in particular one that will allow her more freedom. She does not want to carry around the monitor to check her BS, she would like something that she can monitor on the phone  ?-Recommended to continue current medication ?-Collaborate with PCP team to help patient apply for CGM.  ? ?Patient Goals/Self-Care Activities ?Patient will:  ?- collaborate with provider on medication access solutions ? ?Follow Up Plan: The patient has been provided with contact information for the care management team and has been advised to call with any  health related questions or concerns.  ?-Patient to have PCP office visit  FNP-[place], on 05/17/2021  ?  ? ? ? ?Patient agreed to services and verbal consent obtained.  ? ?The patient verbalized understanding of instructions, educational materials, and care plan provided today and agreed to receive a mailed copy of patient instructions, educational materials, and care plan.  ? ?[name], PharmD ?Clinical Pharmacist ?[facility] ?[phone] ?  ?

## 2021-05-04 NOTE — Chronic Care Management (AMB) (Signed)
? ? ?  Chronic Care Management ?Pharmacy Assistant  ? ?Name: Gabriella Acosta  MRN: [id number] DOB: 1945-12-12 ? ?Reason for Encounter: Patient Assistance Coordination ?  ?05/04/2021- Filled out form with [facility] Written order for [employer]. Awaiting provider signature to fax.  ? ?Received providers signature, faxed to [facility]. ? ?Medications: ?Outpatient Encounter Medications as of 05/04/2021  ?Medication Sig  ? amLODipine (NORVASC) 5 MG tablet Take one tablet by mouth daily  ? atorvastatin (LIPITOR) 80 MG tablet TAKE ONE TABLET BY MOUTH AT BEDTIME  ? Blood Glucose Monitoring Suppl DEVI Check blood sugars twice daily. E11.9 (Patient not taking: Reported on 03/10/2021)  ? empagliflozin (JARDIANCE) 25 MG TABS tablet Take 1 tablet (25 mg total) by mouth daily before breakfast.  ? insulin glargine (LANTUS SOLOSTAR) 100 UNIT/ML Solostar Pen Inject 35 Units into the skin daily.  ? Insulin Pen Needle 32G X 6 MM MISC 1 each by Does not apply route in the morning, at noon, in the evening, and at bedtime.  ? losartan (COZAAR) 50 MG tablet Take 1 tablet (50 mg total) by mouth daily.  ? magnesium 30 MG tablet Take 30 mg by mouth once a week.  ? Multiple Vitamins-Minerals (MULTIVITAMIN WITH MINERALS) tablet Take 1 tablet by mouth daily.  ? niacin (NIASPAN) 500 MG CR tablet TAKE ONE TABLET BY MOUTH EVERYDAY AT BEDTIME  ? pioglitazone (ACTOS) 15 MG tablet Take 1 tablet (15 mg total) by mouth daily.  ? potassium chloride (KLOR-CON) 10 MEQ tablet Take 1 tablet (10 mEq total) by mouth daily.  ? VASCEPA 1 g capsule TAKE 2 CAPSULES(2 GRAMS) BY MOUTH TWICE DAILY  ? ?No facility-administered encounter medications on file as of 05/04/2021.  ? ?Billee Cashing, CMA ?Clinical Pharmacist Assistant ?[phone] ? ?

## 2021-05-08 DIAGNOSIS — E1165 Type 2 diabetes mellitus with hyperglycemia: Secondary | ICD-10-CM | POA: Diagnosis not present

## 2021-05-08 DIAGNOSIS — I10 Essential (primary) hypertension: Secondary | ICD-10-CM

## 2021-05-09 ENCOUNTER — Telehealth: Payer: Self-pay

## 2021-05-09 NOTE — Chronic Care Management (AMB) (Signed)
? ? ?Chronic Care Management ?Pharmacy Assistant  ? ?Name: Gabriella Acosta  MRN: [id number] DOB: 12-14-1945 ? ?Reason for Encounter: Medication Review/ Medication coordination ? ?Recent office visits:  ?None ? ?Recent consult visits:  ?None ? ?Hospital visits:  ?Medication Reconciliation was completed by comparing discharge summary, patient?s EMR and Pharmacy list, and upon discussion with patient. ? ?Admitted to the hospital on 03-16-2021 due to Herpes zoster without complication. Discharge date was 03-16-2021. Discharged from [facility] health urgent care. ? ?New?Medications Started at [facility] Discharge:?? ?Prednisone 20 mg daily ?VALTREX 1,000 mg 3 times daily ? ?Medication Changes at Hospital Discharge: ?None ? ?Medications Discontinued at Hospital Discharge: ?None ? ?Medications that remain the same after Hospital Discharge:??  ?-All other medications will remain the same.   ? ?Medications: ?Outpatient Encounter Medications as of 05/09/2021  ?Medication Sig  ? amLODipine (NORVASC) 5 MG tablet Take one tablet by mouth daily  ? atorvastatin (LIPITOR) 80 MG tablet TAKE ONE TABLET BY MOUTH AT BEDTIME  ? Blood Glucose Monitoring Suppl DEVI Check blood sugars twice daily. E11.9 (Patient not taking: Reported on 03/10/2021)  ? empagliflozin (JARDIANCE) 25 MG TABS tablet Take 1 tablet (25 mg total) by mouth daily before breakfast.  ? insulin glargine (LANTUS SOLOSTAR) 100 UNIT/ML Solostar Pen Inject 35 Units into the skin daily.  ? Insulin Pen Needle 32G X 6 MM MISC 1 each by Does not apply route in the morning, at noon, in the evening, and at bedtime.  ? losartan (COZAAR) 50 MG tablet Take 1 tablet (50 mg total) by mouth daily.  ? magnesium 30 MG tablet Take 30 mg by mouth once a week.  ? Multiple Vitamins-Minerals (MULTIVITAMIN WITH MINERALS) tablet Take 1 tablet by mouth daily.  ? niacin (NIASPAN) 500 MG CR tablet TAKE ONE TABLET BY MOUTH EVERYDAY AT BEDTIME  ? pioglitazone (ACTOS) 15 MG tablet Take 1 tablet (15 mg total)  by mouth daily.  ? potassium chloride (KLOR-CON) 10 MEQ tablet Take 1 tablet (10 mEq total) by mouth daily.  ? VASCEPA 1 g capsule TAKE 2 CAPSULES(2 GRAMS) BY MOUTH TWICE DAILY  ? ?No facility-administered encounter medications on file as of 05/09/2021.  ?Reviewed chart for medication changes ahead of medication coordination call. ? ?No OVs, Consults, since last care coordination call/Pharmacist visit. ? ?No medication changes indicated OR if recent visit, treatment plan here. ? ?BP Readings from Last 3 Encounters:  ?03/16/21 (!) 161/81  ?03/10/21 130/74  ?02/09/21 128/74  ?  ?Lab Results  ?Component Value Date  ? HGBA1C 9.5 (H) 02/09/2021  ?  ? ?Patient obtains medications through Adherence Packaging  90 Days  ? ?Last adherence delivery included:  ?Jardiance 10 mg daily at breakfast ?Amlodipine 5 mg daily before breakfast ?Niacin Er 500 mg daily at bedtime ?Potassium Chloride 10 mg daily before breakfast ?Atorvastatin 80 mg daily before breakfast ?Losartan 50 mg daily at breakfast ? ?Patient declined (meds) last month:  ?Januvia 100 mg- Discontinued ? ?Patient is due for next adherence delivery on: 05-17-2021 ? ?Called patient and reviewed medications and coordinated delivery. ? ?This delivery to include: ?Losartan 50 mg daily at breakfast ?Atorvastatin 80 mg daily before breakfast ?Niacin Er 500 mg daily at bedtime ?Potassium Chloride 10 mg daily before breakfast ?Jardiance 10 mg daily at breakfast ?Amlodipine 5 mg daily before breakfast ? ?No acute/short fill needed ? ?Patient declined the following medications: ?None ? ?Patient needs refills for: Refill request sent ?Jardiance ?Amlodipine ? ?Confirmed delivery date of 05-17-2021 advised patient that pharmacy  will contact them the morning of delivery. ? ? ? ?Care Gaps: ?Colonoscopy overdue ?Yearly ophthalmology overdue ?AWV 09-21-2021 ? ?Star Rating Drugs: ?Jardiance 10 mg- Last filled 03-10-2021 90 DS [facility] ?Losartan 50 mg- Last filled 02-09-2021 90 DS  [facility] ?Atorvastatin 80 mg- Last filled 02-09-2021 90 [facility] ?Actos 15 mg- Last filled 03-10-2021 90 DS [facility] ? ?[name] CMA ?Clinical Pharmacist Assistant ?[phone] ? ?

## 2021-05-11 ENCOUNTER — Other Ambulatory Visit: Payer: Self-pay | Admitting: Nurse Practitioner

## 2021-05-11 DIAGNOSIS — E1165 Type 2 diabetes mellitus with hyperglycemia: Secondary | ICD-10-CM

## 2021-05-11 NOTE — Telephone Encounter (Signed)
Approved Amlodipine 5mg  ?

## 2021-05-17 ENCOUNTER — Telehealth: Payer: Medicare PPO

## 2021-05-17 ENCOUNTER — Encounter: Payer: Self-pay | Admitting: Nurse Practitioner

## 2021-05-17 ENCOUNTER — Ambulatory Visit: Payer: Medicare PPO | Admitting: Nurse Practitioner

## 2021-05-17 VITALS — BP 108/80 | HR 84 | Temp 98.1°F | Ht 63.0 in | Wt 122.4 lb

## 2021-05-17 DIAGNOSIS — Z23 Encounter for immunization: Secondary | ICD-10-CM | POA: Diagnosis not present

## 2021-05-17 DIAGNOSIS — E1165 Type 2 diabetes mellitus with hyperglycemia: Secondary | ICD-10-CM | POA: Diagnosis not present

## 2021-05-17 DIAGNOSIS — E781 Pure hyperglyceridemia: Secondary | ICD-10-CM

## 2021-05-17 DIAGNOSIS — E782 Mixed hyperlipidemia: Secondary | ICD-10-CM | POA: Diagnosis not present

## 2021-05-17 DIAGNOSIS — I10 Essential (primary) hypertension: Secondary | ICD-10-CM | POA: Diagnosis not present

## 2021-05-17 MED ORDER — SHINGRIX 50 MCG/0.5ML IM SUSR: 0.5000 mL | Freq: Once | INTRAMUSCULAR | 1 refills | Status: AC

## 2021-05-17 NOTE — Patient Instructions (Signed)

## 2021-05-17 NOTE — Progress Notes (Signed)
I,[name],acting as a Education administrator for [name], FNP.,have documented all relevant documentation on the behalf of [name], FNP,as directed by  [name], FNP while in the presence of [name], [place].   This visit occurred during the SARS-CoV-2 public health emergency.  Safety protocols were in place, including screening questions prior to the visit, additional usage of staff PPE, and extensive cleaning of exam room while observing appropriate contact time as indicated for disinfecting solutions.  Subjective:     Patient ID: Gabriella Acosta , female    DOB: 1945-11-17 , 76 y.o.   MRN: [id number]   Chief Complaint  Patient presents with   Diabetes   Hypertension    HPI  Pt here today for bp & dm check (she is being followed by Dr. Charlett Lango).  She had changes to her medications increased her Lantus to 35 units, Jardiance to 25 mg and pioglitazone 15 mg.  She has no specific questions or concerns at this time.    Diabetes She presents for her follow-up diabetic visit. She has type 2 diabetes mellitus. Her disease course has been worsening. Pertinent negatives for diabetes include no polydipsia, no polyphagia and no polyuria. There are no hypoglycemic complications. There are no diabetic complications. Risk factors for coronary artery disease include sedentary lifestyle. She is following a generally unhealthy diet. When asked about meal planning, she reported none. She has not had a previous visit with a dietitian. She never participates in exercise. (Blood sugars have been as low as 70's) An ACE inhibitor/angiotensin II receptor blocker is being taken. She does not see a podiatrist.Eye exam is not current (she has not had a diabetic eye exam).  Hypertension This is a chronic problem. The current episode started more than 1 year ago. The problem has been gradually improving since onset. Condition status: better controlled. Risk factors for coronary artery disease include  sedentary lifestyle, dyslipidemia and diabetes mellitus. Past treatments include angiotensin blockers. There is no history of angina or kidney disease.  Hyperlipidemia This is a chronic problem. The problem is uncontrolled. Recent lipid tests were reviewed and are high. Current antihyperlipidemic treatment includes nicotinic acid. There are no compliance problems.  Risk factors for coronary artery disease include dyslipidemia, obesity, hypertension, a sedentary lifestyle and diabetes mellitus.    Past Medical History:  Diagnosis Date   Diabetes mellitus without complication (HCC)    Hypertension    Type 2 diabetes mellitus ([place]) 07/22/2020     Family History  Problem Relation Age of Onset   Diabetes Mother    Diabetes Father    Heart disease Father    Breast cancer Neg Hx      Current Outpatient Medications:    amLODipine (NORVASC) 5 MG tablet, TAKE ONE TABLET BY MOUTH BEFORE BREAKFAST, Disp: 90 tablet, Rfl: 2   atorvastatin (LIPITOR) 80 MG tablet, TAKE ONE TABLET BY MOUTH AT BEDTIME, Disp: 90 tablet, Rfl: 1   empagliflozin (JARDIANCE) 25 MG TABS tablet, Take 1 tablet (25 mg total) by mouth daily before breakfast., Disp: 90 tablet, Rfl: 3   insulin glargine (LANTUS SOLOSTAR) 100 UNIT/ML Solostar Pen, Inject 35 Units into the skin daily., Disp: 30 mL, Rfl: 6   Insulin Pen Needle 32G X 6 MM MISC, 1 each by Does not apply route in the morning, at noon, in the evening, and at bedtime., Disp: 300 each, Rfl: 3   losartan (COZAAR) 50 MG tablet, Take 1 tablet (50 mg total) by mouth daily., Disp: 90  tablet, Rfl: 1   magnesium 30 MG tablet, Take 30 mg by mouth once a week., Disp: , Rfl:    Multiple Vitamins-Minerals (MULTIVITAMIN WITH MINERALS) tablet, Take 1 tablet by mouth daily., Disp: , Rfl:    potassium chloride (KLOR-CON) 10 MEQ tablet, Take 1 tablet (10 mEq total) by mouth daily., Disp: 90 tablet, Rfl: 1   VASCEPA 1 g capsule, TAKE 2 CAPSULES(2 GRAMS) BY MOUTH TWICE DAILY, Disp: 120  capsule, Rfl: 3   Blood Glucose Monitoring Suppl DEVI, Check blood sugars twice daily. E11.9 (Patient not taking: Reported on 03/10/2021), Disp: 1 kit, Rfl: 0   JARDIANCE 10 MG TABS tablet, TAKE ONE TABLET BY MOUTH ONCE DAILY (Patient not taking: Reported on 05/17/2021), Disp: 30 tablet, Rfl: 2   pioglitazone (ACTOS) 15 MG tablet, Take 1 tablet (15 mg total) by mouth daily. (Patient not taking: Reported on 05/17/2021), Disp: 90 tablet, Rfl: 3   No Known Allergies   Review of Systems  Constitutional: Negative.   Respiratory: Negative.    Endocrine: Negative for polydipsia, polyphagia and polyuria.  Psychiatric/Behavioral: Negative.      Today's Vitals   05/17/21 0903  BP: 108/80  Pulse: 84  Temp: 98.1 F (36.7 C)  SpO2: 98%  Weight: 122 lb 6.4 oz (55.5 kg)  Height: '5\' 3"'  (1.6 m)  PainSc: 0-No pain   Body mass index is 21.68 kg/m.  Wt Readings from Last 3 Encounters:  05/17/21 122 lb 6.4 oz (55.5 kg)  03/10/21 118 lb (53.5 kg)  02/09/21 126 lb (57.2 kg)    Objective:  Physical Exam Vitals reviewed.  Constitutional:      General: She is not in acute distress.    Appearance: She is well-developed.  Cardiovascular:     Rate and Rhythm: Normal rate and regular rhythm.     Pulses: Normal pulses.     Heart sounds: Normal heart sounds. No murmur heard. Pulmonary:     Effort: Pulmonary effort is normal. No respiratory distress.     Breath sounds: Normal breath sounds. No wheezing.  Skin:    General: Skin is warm and dry.  Neurological:     General: No focal deficit present.     Mental Status: She is alert and oriented to person, place, and time.     Cranial Nerves: No cranial nerve deficit.     Motor: No weakness.  Psychiatric:        Mood and Affect: Mood normal. Mood is not anxious.        Behavior: Behavior normal. Behavior is not agitated.        Thought Content: Thought content normal.        Judgment: Judgment normal.        Assessment And Plan:     1. Uncontrolled  type 2 diabetes mellitus with hyperglycemia ([place]) Comments: Continue follow up with Endocrinology.  Continue current medications. - CMP14+EGFR - Hemoglobin A1c - Ambulatory referral to Ophthalmology  2. Essential hypertension Comments: Well controlled, continue current medications - CMP14+EGFR  3. Mixed hyperlipidemia Comments: Stable, continue current medications. Also continue low fat diet - Lipid panel  4. Immunization due Comments: Shingrix Rx sent to pharmacy unable to get at office rejected by [facility].      Patient was given opportunity to ask questions. Patient verbalized understanding of the plan and was able to repeat key elements of the plan. All questions were answered to their satisfaction.  [name], FNP   I, [name], FNP, have reviewed  all documentation for this visit. The documentation on 05/17/21 for the exam, diagnosis, procedures, and orders are all accurate and complete.   IF YOU HAVE BEEN REFERRED TO A SPECIALIST, IT MAY TAKE 1-2 WEEKS TO SCHEDULE/PROCESS THE REFERRAL. IF YOU HAVE NOT HEARD FROM US/SPECIALIST IN TWO WEEKS, PLEASE GIVE [place] A CALL AT [phone].   THE PATIENT IS ENCOURAGED TO PRACTICE SOCIAL DISTANCING DUE TO THE COVID-19 PANDEMIC.

## 2021-05-24 ENCOUNTER — Ambulatory Visit (HOSPITAL_COMMUNITY)
Admission: RE | Admit: 2021-05-24 | Payer: Medicare PPO | Source: Ambulatory Visit | Attending: Internal Medicine | Admitting: Internal Medicine

## 2021-05-25 ENCOUNTER — Telehealth: Payer: Self-pay

## 2021-05-25 NOTE — Chronic Care Management (AMB) (Signed)
05-25-2021: Was instructed by [name] CPP to tell patient to discontinue the niacin. Patient stated she has been off of niacin since 05-17-2021 office visit.  Huey Romans [facility] Clinical Pharmacist Assistant [phone]

## 2021-05-25 NOTE — Telephone Encounter (Signed)
?  Chronic Care Management  ? ?Provider Question Niacin and Flushing ? ? ?05/25/2021 ?Name: Gabriella Acosta MRN: [id number] DOB: [birth year] ? ?Referred by: [name], FNP ?Reason for referral : No chief complaint on file. ? ? ?Gabriella Acosta is a 76 y.o. year old female who is a primary care patient of [name], FNP. The CCM team was consulted for assistance with chronic disease management and care coordination needs.   ? ? ?Objective:   ? ?Provider Question:  ? ?-PCP contacted PharmD in regards to the use of Niacin in patient. Recommended patient not take this medication due to its low ability to help patients improve in cardiovascular risk.  ? ?According to the NIH Niacin Fact Sheet for Health Professionals: https://ods.ThisMLS.nl ? ?In their guidelines for lowering blood cholesterol levels, the [employer] of Cardiology and the American Heart Association advise that nonstatin therapies, compared with or in addition to statin therapy, do not provide atherosclerotic cardiovascular disease risk-reduction benefits that outweigh the potential harms of their adverse effects [29].  In their 2018 report, these two professional societies stated what although niacin may be useful in some cases of severe hypertriglyceridemia, it has only mild LDL-lowering effects. The societies therefore do not recommend using it as an add-on drug to statin therapy [35]. ? ?Overall, the evidence indicates that nicotinic acid supplementation improves blood lipid profiles but has no significant effects on risk of cardiovascular events. The societies also recommend that patients not use nicotinic acid supplements or stop using them if their hepatic transaminase levels are more than two or three times the upper limits of normal; if they develop persistent hyperglycemia, acute gout, unexplained abdominal pain, gastrointestinal symptoms, new-onset atrial fibrillation, or weight loss; or if  they have persistent and severe skin reactions, such as flushing or rashes. ? ?-Stone et al. [employer] of Cardiology/American Heart Association Task Force on [employer] 2013 ACC/AHA guideline on the treatment of blood cholesterol to reduce atherosclerotic cardiovascular risk in adults. J Am Coll Cardiol [phone]. [PubMed abstract] ? ?-Quintella Baton, [place], [place], [place], [place], [place] al. 2018 AHA/ACC/AACVPR/AAP/ABC/ACPM/ADA/AGS/APhA/ASPC/NLA/PCNA Guideline on the management of blood cholesterol. Circulation. Published November 18, 2016. [PubMed abstract] ? ? ? ? ?Plan: I recommended patients Niacin be discontinued, due to flushing that PCP team noted was occurring with the medication and gave the information above.  ? ?Next PCP appointment scheduled for:   09/21/2021  ? ? ?[name], CPP, PharmD ?Clinical Pharmacist Practitioner ?[facility] ?[phone] ? ?  ?

## 2021-05-25 NOTE — Addendum Note (Signed)
Addended by: Mayford Knife on: 05/25/2021 11:02 AM ? ? Modules accepted: Orders ? ?

## 2021-05-27 ENCOUNTER — Telehealth: Payer: Self-pay

## 2021-05-27 ENCOUNTER — Telehealth: Payer: Medicare PPO

## 2021-05-27 NOTE — Telephone Encounter (Signed)
  Care Management   Follow Up Note   05/27/2021 Name: Gabriella Acosta MRN: [id number] DOB: [birth year]   Referred by: [name], FNP Reason for referral : Chronic Care Management (RN CM Follow up call )   An unsuccessful telephone outreach was attempted today. The patient was referred to the case management team for assistance with care management and care coordination.   Follow Up Plan: Telephone follow up appointment with care management team member scheduled for: 06/15/21  Delsa Sale, RN, BSN, CCM Care Management Coordinator [facility] Care Management/[facility]  Direct Phone: [phone]

## 2021-06-02 ENCOUNTER — Ambulatory Visit: Payer: Medicare PPO | Admitting: Nurse Practitioner

## 2021-06-03 ENCOUNTER — Encounter: Payer: Self-pay | Admitting: Nurse Practitioner

## 2021-06-13 ENCOUNTER — Ambulatory Visit: Payer: Medicare PPO | Admitting: Nurse Practitioner

## 2021-06-15 ENCOUNTER — Ambulatory Visit (INDEPENDENT_AMBULATORY_CARE_PROVIDER_SITE_OTHER): Payer: Medicare PPO

## 2021-06-15 ENCOUNTER — Telehealth: Payer: Medicare PPO

## 2021-06-15 DIAGNOSIS — I10 Essential (primary) hypertension: Secondary | ICD-10-CM

## 2021-06-15 DIAGNOSIS — E782 Mixed hyperlipidemia: Secondary | ICD-10-CM

## 2021-06-15 DIAGNOSIS — Z720 Tobacco use: Secondary | ICD-10-CM

## 2021-06-15 DIAGNOSIS — E1165 Type 2 diabetes mellitus with hyperglycemia: Secondary | ICD-10-CM

## 2021-06-16 NOTE — Chronic Care Management (AMB) (Signed)
Chronic Care Management   CCM RN Visit Note  06/15/2021 Name: Gabriella DunkerLoretta Dixon Froman MRN: [id number] DOB: 11/28/1945  Subjective: Gabriella Acosta is a 76 y.o. year old female who is a primary care patient of Arnette FeltsMoore, [name], FNP. The care management team was consulted for assistance with disease management and care coordination needs.    Engaged with patient by telephone for follow up visit in response to provider referral for case management and/or care coordination services.   Consent to Services:  The patient was given information about Chronic Care Management services, agreed to services, and gave verbal consent prior to initiation of services.  Please see initial visit note for detailed documentation.   Patient agreed to services and verbal consent obtained.   Assessment: Review of patient past medical history, allergies, medications, health status, including review of consultants reports, laboratory and other test data, was performed as part of comprehensive evaluation and provision of chronic care management services.   SDOH (Social Determinants of Health) assessments and interventions performed:    CCM Care Plan  No Known Allergies  Outpatient Encounter Medications as of 06/15/2021  Medication Sig   amLODipine (NORVASC) 5 MG tablet TAKE ONE TABLET BY MOUTH BEFORE BREAKFAST   atorvastatin (LIPITOR) 80 MG tablet TAKE ONE TABLET BY MOUTH AT BEDTIME   Blood Glucose Monitoring Suppl DEVI Check blood sugars twice daily. E11.9 (Patient not taking: Reported on 03/10/2021)   empagliflozin (JARDIANCE) 25 MG TABS tablet Take 1 tablet (25 mg total) by mouth daily before breakfast.   insulin glargine (LANTUS SOLOSTAR) 100 UNIT/ML Solostar Pen Inject 35 Units into the skin daily.   Insulin Pen Needle 32G X 6 MM MISC 1 each by Does not apply route in the morning, at noon, in the evening, and at bedtime.   JARDIANCE 10 MG TABS tablet TAKE ONE TABLET BY MOUTH ONCE DAILY (Patient not taking: Reported  on 05/17/2021)   losartan (COZAAR) 50 MG tablet Take 1 tablet (50 mg total) by mouth daily.   magnesium 30 MG tablet Take 30 mg by mouth once a week.   Multiple Vitamins-Minerals (MULTIVITAMIN WITH MINERALS) tablet Take 1 tablet by mouth daily.   pioglitazone (ACTOS) 15 MG tablet Take 1 tablet (15 mg total) by mouth daily. (Patient not taking: Reported on 05/17/2021)   potassium chloride (KLOR-CON) 10 MEQ tablet Take 1 tablet (10 mEq total) by mouth daily.   VASCEPA 1 g capsule TAKE 2 CAPSULES(2 GRAMS) BY MOUTH TWICE DAILY   No facility-administered encounter medications on file as of 06/15/2021.    Patient Active Problem List   Diagnosis Date Noted   Dyslipidemia 12/09/2020   Other insomnia 10/18/2020   Uncontrolled type 2 diabetes mellitus with hyperglycemia (HCC) 10/18/2020   Tobacco abuse 07/22/2020   Type 2 diabetes mellitus (HCC) 07/22/2020   Cognitive deficits 11/07/2018   ETOH abuse 07/30/2013   Anxiety 07/30/2013   Essential hypertension 01/31/2011    Conditions to be addressed/monitored: DM II, HTN, Mixed hyperlipidemia, Tobacco Use   Care Plan : RN Care Manager Plan of Care  Updates made by Riley ChurchesLittle, Thomas Rhude L, RN since 06/15/2021 12:00 AM     Problem: No plan of care established for management of chronic disease states (DM II, HTN, Mixed hyperlipidemia, Tobacco Use)   Priority: High     Long-Range Goal: Development of plan of care for chronic disease management for DM II, HTN, Mixed hyperlipidemia, Tobacco Use   Start Date: 12/13/2020  Expected End Date: 12/13/2021  Recent Progress: On  track  Priority: High  Note:   Current Barriers:  Knowledge Deficits related to plan of care for management of DM II, HTN, Mixed hyperlipidemia, Tobacco Use   Chronic Disease Management support and education needs related to DM II, HTN, Mixed hyperlipidemia, Tobacco Use   Financial Restraints   RNCM Clinical Goal(s):  Patient will verbalize basic understanding of  DM II, HTN, Mixed  hyperlipidemia, Tobacco Use  disease process and self health management plan as evidenced by patient will report having no disease exacerbations related to her chronic disease states listed above  take all medications exactly as prescribed and will call provider for medication related questions as evidenced by patient will report having no missed doses of her prescribed medications demonstrate Improved health management independence as evidenced by patient will report 100% adherence to following her prescribed treatment plan as directed  continue to work with RN Care Manager to address care management and care coordination needs related to  DM II, HTN, Mixed hyperlipidemia, Tobacco Use  as evidenced by adherence to CM Team Scheduled appointments demonstrate ongoing self health care management ability   as evidenced by    through collaboration with RN Care manager, provider, and care team.   Interventions: 1:1 collaboration with primary care provider regarding development and update of comprehensive plan of care as evidenced by provider attestation and co-signature Inter-disciplinary care team collaboration (see longitudinal plan of care) Evaluation of current treatment plan related to  self management and patient's adherence to plan as established by provider  Diabetes Interventions:  (Status:  Condition stable.  Not addressed this visit.) Long Term Goal Assessed patient's understanding of A1c goal: <7% Review of patient status, including review of consultant's reports, relevant laboratory and other test results, and medications completed Reviewed medications with patient and discussed importance of medication adherence Reviewed and discussed recent Endo follow with completed with Dr. Lonzo Cloud on 03/10/21 with the following Assessment/Plan noted:  1) Type 2 Diabetes Mellitus, with improving glycemic control Without complications - Most recent A1c of 9.5 %. Goal A1c < 7.0 %.   - A1c down from 13.8%   - I suspect this is due to pancreatic dysfunction given hx of pancreatitis and ETOH abuse  - She is unable to take prandial insulin, pt states her working schedule interferes with this  MEDICATIONS: - INcrease Lantus to 35 units daily  - Increase Jardiance 25 mg daily  -Continue pioglitazone 15 mg daily  EDUCATION / INSTRUCTIONS: BG monitoring instructions: Patient is instructed to check her blood sugars 1 times a day, fasting . Call [facility] if: BG persistently < 70  I reviewed the Rule of 15 for the treatment of hypoglycemia in detail with the patient. Literature supplied. 2) Diabetic complications:  Eye: Does not have known diabetic retinopathy.  Neuro/ Feet: Does not have known diabetic peripheral neuropathy. Renal: Patient does not have known baseline CKD. She is  on an ACEI/ARB at present. 3) Dyslipidemia  - LDL is at goal  - Tg elevated, will monitor  Continue Atorvastatin 80 mg daily  Advised patient, providing education and rationale, to check cbg once daily before meals per Dr. Lonzo Cloud and record, calling Dr. Lonzo Cloud for findings outside established parameters Educated patient and provided rationale on importance of close monitoring of CBG's while taking insulin for Shingles, calling Dr. Lonzo Cloud for persistent CBG's >250 Reinforced importance of adhering to dietary and exercise recommendation  Mailed printed educational materials related to Carb Choices; The Dangers in Skipping Meals Lab Results  Component Value Date   HGBA1C 9.5 (H) 02/09/2021    Hyperlipidemia Interventions:  (Status: Condition stable.  Not addressed this visit. ) Long Term Goal Review of patient status, including review of consultant's reports, relevant laboratory and other test results, and medications completed  Provider established cholesterol goals reviewed Reviewed exercise goals and target of 150 minutes per week Educated on dietary recommendations for eating a low  Saturated and low Trans fat diet  Mailed printed educational materials related to [employer] on Fats; Full Body Workout with 6 Chair Exerices Discussed plans with patient for ongoing care management follow up and provided patient with direct contact information for care management team Lipid Panel     Component Value Date/Time   CHOL 149 12/09/2020 0856   CHOL 185 03/25/2020 1213   TRIG 174.0 (H) 12/09/2020 0856   HDL 47.50 12/09/2020 0856   HDL 40 03/25/2020 1213   CHOLHDL 3 12/09/2020 0856   VLDL 34.8 12/09/2020 0856   LDLCALC 67 12/09/2020 0856   LDLCALC 91 03/25/2020 1213   LABVLDL 54 (H) 03/25/2020 1213   Herpes Zoster without complications Interventions:  (Status:  Condition stable.  Not addressed this visit.)  Short Term Goal Evaluation of current treatment plan related to  Shingles outbreak ,  self-management and patient's adherence to plan as established by provider Determined patient visited the [facility] on 03/17/21 for diagnosis of Herpes Zoster Educated patient on basic disease process related to Herpes Zoster, including treatment, potential complications and when to seek medical attention if complications occur Reviewed and discussed the following Assessment/Plan for this condition: Symptoms are consistent with a herpes zoster rash.  Patient's symptoms developed approximately 3 days ago along the left torso with a dermatomal distribution.  The rash presents in a dermatomal distribution along the left torso.  We will prescribe the patient valacyclovir for her symptoms.  We will also prescribe prednisone to help with inflammation and itching.  Patient encouraged to avoid scratching or irritating the area, keep the area loosely covered, and avoid contact if the blisters are oozing.  Follow-up with your primary care if symptoms do not improve. Reviewed medications with patient and discussed importance of medication adherence, educated on potential increase in blood sugars  while taking Prednisone, instructed patient to monitor her sugars more closely and to report persistent readings >250 to Dr. Lonzo Cloud Educated patient on cdc recommendations for Shingrix vaccinations, encouraged patient to discuss further with embedded pharm D during next scheduled call  Reviewed scheduled/upcoming provider appointments including: Pharm D telephonic visit scheduled for 03/25/21 @11 :00 AM Discussed plans with patient for ongoing care management follow up and provided patient with direct contact information for care management team   Ineffective Health Maintenance Interventions:  (Status:  New goal.)  Short Term Goal Completed successful outbound call with patient, she asked to keep the call brief due to being at work  Evaluation of current treatment plan related to  Ineffective Health Maintenance , self-management and patient's adherence to plan as established by provider Evaluation of current treatment plan related to health maintenance and patient's adherence to plan as established by provider Determined patient missed her PCP follow up appointment, she also missed her Carotid doppler study due to work conflict Educated patient on the importance to attend all scheduled provider appointments for effective health maintenance, to help with early diagnosis and treatment and to help best manage her chronic health conditions Advised patient to discuss rescheduled Carotid doppler study with provider; Instructed patient to  contact her PCP office to reschedule her follow up appointment when ready  Discussed plans with patient for ongoing care management follow up and provided patient with direct contact information for care management team  Patient Goals/Self-Care Activities: Take all medications as prescribed Attend all scheduled provider appointments Call pharmacy for medication refills 3-7 days in advance of running out of medications Perform all self care activities independently   Call provider office for new concerns or questions  drink 6 to 8 glasses of water each day manage portion size take all medications exactly as prescribed call doctor with any symptoms you believe are related to your medicine call doctor when you experience any new symptoms adhere to prescribed diet: low Sodium, low fat Contact your health care providers in order to reschedule your missed appointments   Follow Up Plan:  Telephone follow up appointment with care management team member scheduled for:  720/23      Delsa Sale, RN, BSN, CCM Care Management Coordinator [facility] Care Management/[facility]  Direct Phone: [phone]

## 2021-06-16 NOTE — Patient Instructions (Signed)
Visit Information  Thank you for taking time to visit with me today. Please don't hesitate to contact me if I can be of assistance to you before our next scheduled telephone appointment.  Following are the goals we discussed today:  (Copy and paste patient goals from clinical care plan here)  Our next appointment is by telephone on 07/28/21 at 12:15 PM   Please call the care guide team at [phone] if you need to cancel or reschedule your appointment.   If you are experiencing a Mental Health or Behavioral Health Crisis or need someone to talk to, please call 1-800-273-TALK (toll free, 24 hour hotline)   Patient verbalizes understanding of instructions and care plan provided today and agrees to view in MyChart. Active MyChart status and patient understanding of how to access instructions and care plan via MyChart confirmed with patient.     Delsa Sale, RN, BSN, CCM Care Management Coordinator [facility] Care Management/[facility]  Direct Phone: [phone]

## 2021-06-23 ENCOUNTER — Other Ambulatory Visit: Payer: Self-pay | Admitting: Internal Medicine

## 2021-07-08 DIAGNOSIS — Z794 Long term (current) use of insulin: Secondary | ICD-10-CM

## 2021-07-08 DIAGNOSIS — E1169 Type 2 diabetes mellitus with other specified complication: Secondary | ICD-10-CM | POA: Diagnosis not present

## 2021-07-08 DIAGNOSIS — E785 Hyperlipidemia, unspecified: Secondary | ICD-10-CM

## 2021-07-13 ENCOUNTER — Telehealth: Payer: Self-pay

## 2021-07-13 NOTE — Chronic Care Management (AMB) (Addendum)
    Called Barrie Dunker, No answer, Unable to leave message of appointment on 07-15-2021 at 12:00 via telephone visit with [name], Pharm D.   07-14-2021: called patient back to offer an earlier appointment time on 07-15-2021. Patient was ok with moving appointment to 11:00.  Huey Romans [facility] Clinical Pharmacist Assistant [phone]

## 2021-07-15 ENCOUNTER — Ambulatory Visit: Payer: Medicare PPO

## 2021-07-15 ENCOUNTER — Telehealth: Payer: Self-pay

## 2021-07-15 ENCOUNTER — Telehealth: Payer: Medicare PPO

## 2021-07-15 DIAGNOSIS — E1165 Type 2 diabetes mellitus with hyperglycemia: Secondary | ICD-10-CM

## 2021-07-15 DIAGNOSIS — E782 Mixed hyperlipidemia: Secondary | ICD-10-CM

## 2021-07-15 NOTE — Patient Instructions (Signed)
Visit Information It was great speaking with you today!  Please let me know if you have any questions about our visit.   Goals Addressed             This Visit's Progress    Manage My Medicine       Timeframe:  Long-Range Goal Priority:  High Start Date: 05/05/2020                            Expected End Date: 10/2021                     Follow Up Date:08/09/2021  In Progress:  - call for medicine refill 2 or 3 days before it runs out - keep a list of all the medicines I take; vitamins and herbals too - use a pillbox to sort medicine - use an alarm clock or phone to remind me to take my medicine    Why is this important?   These steps will help you keep on track with your medicines.          Patient Care Plan: CCM Pharmacy Care Plan     Problem Identified: HTN, DM II   Priority: High     Long-Range Goal: Disease Management   Recent Progress: On track  Priority: High  Note:   Current Barriers:  Unable to independently monitor therapeutic efficacy Unable to achieve control of Diabetes    Pharmacist Clinical Goal(s):  Patient will achieve adherence to monitoring guidelines and medication adherence to achieve therapeutic efficacy through collaboration with PharmD and provider.   Interventions: 1:1 collaboration with [name], FNP regarding development and update of comprehensive plan of care as evidenced by provider attestation and co-signature Inter-disciplinary care team collaboration (see longitudinal plan of care) Comprehensive medication review performed; medication list updated in electronic medical record  Hyperlipidemia: (LDL goal < 55) -Not ideally controlled -Current treatment: Atorvastatin 80 mg tablet daily Appropriate, Effective, Safe, Accessible Vascepa 1 gram capsule- take 2 capsules by mouth twice daily Appropriate, Effective, Safe, Accessible  -Current dietary patterns: Breakfast, lunch, dinner  -Current exercise habits: did not  discuss -Educated on Cholesterol goals;  Benefits of statin for ASCVD risk reduction; -Recommended to continue current medication  Diabetes (A1c goal <7%) -Not ideally controlled -Current medications: Jardiance 25 mg tablet once per day Appropriate, Effective, Safe, Accessible Lantus 100 unit/ ml - 35 units daily Appropriate, Effective, Safe, Accessible Actos 15 mg tablet daily Appropriate, Effective, Safe, Accessible -Current home glucose readings: patient reports that she is not using her glucose meter anymore, because it is really disctracting and she goes by the way she feels. She reports that when her blood sugar is off she might feel hot.  -Denies hypoglycemic/hyperglycemic symptoms  -Current meal patterns:  lunch: varies: fish, chicken, hamburgers   -Current exercise: will discuss more during next office visit -Educated on Complications of diabetes including kidney damage, retinal damage, and cardiovascular disease; Prevention and management of hypoglycemic episodes; Benefits of routine self-monitoring of blood sugar; -At this time she is not interested in checking her BS through pricking her finger or CGM.  -She is willing to check her BS twice per week, I explained that the only method that she would be able to use where she checks her BS twice per week would be through the finger prick method. She voiced understanding and does not want to use this method.  -I explained the difference between  A1c and glucose readings from CGM. I also described the importance of checking her BS at least 4-5 times per day with this device for accuracy. She was open to this but needs helps with placing the app on her phone for easy use.  -I recommended that she come into the PCP ofice to be taught. She reports that she is only available on Thursday morning, I sent a secure chat to PCP team to set up a nurse visit.  -Patient confirmed that she was okay with me leaving a VM on her phone with appointment  details on Tuesday.  -Counseled to check feet daily and get yearly eye exams -Recommended to continue current medication  Patient Goals/Self-Care Activities Patient will:  - take medications as prescribed as evidenced by patient report and record review  Follow Up Plan: The patient has been provided with contact information for the care management team and has been advised to call with any health related questions or concerns.       Patient agreed to services and verbal consent obtained.   The patient verbalized understanding of instructions, educational materials, and care plan provided today and agreed to receive a mailed copy of patient instructions, educational materials, and care plan.   [name], PharmD Clinical Pharmacist [facility] [phone]

## 2021-07-15 NOTE — Chronic Care Management (AMB) (Signed)
07-15-2021: Contacted [facility] to request a transfer to [facility] for Jardiance, Actos, Lantus and Vascepa. Pharmacist stated [facility] will have to request the transfer. Updated Chasity.  Huey Romans [facility] Clinical Pharmacist Assistant [phone]

## 2021-07-15 NOTE — Progress Notes (Signed)
Chronic Care Management Pharmacy Note  07/15/2021 Name:  Gabriella Acosta MRN:  [id number] DOB:  06/10/1945  Summary: Patient reports that she would like all of her medication to come through delivery service.   Recommendations/Changes made from today's visit: Recommend pharmacist team collaborate with specialist to send in prescriptions to patients preferred pharmacy. Recommend Ms. Gabriella Acosta start checking her BP at least twice per day.  Recommend PCP add Uncontrolled Type 2 diabetes mellitus with long term insulin use with hyperglycemia  Patient will need a lipid panel completed  Plan: Pharmacy assistant will be reaching out to specialist to help with prescriptions being sent to [facility].  Patient to have a nurse visit to help download Freestyle 2 app to her phone.    Subjective: Gabriella Acosta is an 77 y.o. year old female who is a primary patient of [name], [place].  The CCM team was consulted for assistance with disease management and care coordination needs.    Engaged with patient by telephone for follow up visit in response to provider referral for pharmacy case management and/or care coordination services.   Consent to Services:  The patient was given information about Chronic Care Management services, agreed to services, and gave verbal consent prior to initiation of services.  Please see initial visit note for detailed documentation.   Patient Care Team: [name], FNP as PCP - General (General Practice) Mayford Knife, [facility] (Pharmacist) Daneen Schick as [place] [facility] [place], Claudette Stapler, RN as [place] [facility]  Recent office visits: 05/17/2021 PCP OV   Recent consult visits: 03/10/2021 [facility] visits: 03/16/2021 ED visit for shingle   Objective:  Lab Results  Component Value Date   CREATININE 0.70 12/09/2020   BUN 10 12/09/2020   GFR 85.51 12/09/2020   EGFR 92 03/25/2020    GFRNONAA >60 05/26/2020   GFRAA 102 10/20/2019   NA 142 12/09/2020   K 4.6 12/09/2020   CALCIUM 10.7 (H) 12/09/2020   CO2 30 12/09/2020   GLUCOSE 277 (H) 12/09/2020    Lab Results  Component Value Date/Time   HGBA1C 9.5 (H) 02/09/2021 12:52 PM   HGBA1C 13.8 (H) 10/18/2020 10:21 AM   GFR 85.51 12/09/2020 08:56 AM   MICROALBUR <0.7 12/09/2020 08:56 AM   MICROALBUR 30 12/10/2018 05:09 PM   MICROALBUR 150 10/31/2017 12:47 PM    Last diabetic Eye exam:  Lab Results  Component Value Date/Time   HMDIABEYEEXA No Retinopathy 10/02/2019 12:00 AM    Last diabetic Foot exam: No results found for: "HMDIABFOOTEX"   Lab Results  Component Value Date   CHOL 149 12/09/2020   HDL 47.50 12/09/2020   LDLCALC 67 12/09/2020   TRIG 174.0 (H) 12/09/2020   CHOLHDL 3 12/09/2020       Latest Ref Rng & Units 05/26/2020    3:17 PM 03/25/2020   12:13 PM 10/20/2019   10:22 AM  Hepatic Function  Total Protein 6.5 - 8.1 g/dL 7.8  7.4  7.7   Albumin 3.5 - 5.0 g/dL 4.0  4.6  4.5   AST 15 - 41 U/L _0 ALT 0 - 44 U/L 21  39  21   Alk Phosphatase 38 - 126 U/L 122  161  185   Total Bilirubin 0.3 - 1.2 mg/dL 0.9  0.3  0.3     Lab Results  Component Value Date/Time   TSH 1.240 10/18/2020 10:21 AM  TSH 1.650 10/20/2019 10:22 AM       Latest Ref Rng & Units 05/26/2020    3:17 PM 10/20/2019   10:22 AM 07/30/2019    3:33 PM  CBC  WBC 4.0 - 10.5 K/uL 10.6  12.8  9.9   Hemoglobin 12.0 - 15.0 g/dL 13.4  13.5  12.6   Hematocrit 36.0 - 46.0 % 40.5  42.2  37.7   Platelets 150 - 400 K/uL 120  132  92     No results found for: "VD25OH"  Clinical ASCVD: No  The 10-year ASCVD risk score (Arnett DK, et al., 2019) is: 33.9%   Values used to calculate the score:     Age: 89 years     Sex: Female     Is Non-Hispanic African American: Yes     Diabetic: Yes     Tobacco smoker: Yes     Systolic Blood Pressure: 382 mmHg     Is BP treated: Yes     HDL Cholesterol: 47.5 mg/dL     Total  Cholesterol: 149 mg/dL       08/12/2020   10:42 AM 07/30/2019    2:16 PM 02/04/2019    2:14 PM  Depression screen PHQ 2/9  Decreased Interest 0 0 0  Down, Depressed, Hopeless 0 0 0  PHQ - 2 Score 0 0 0      Social History   Tobacco Use  Smoking Status Every Day   Packs/day: 0.25   Types: Cigarettes  Smokeless Tobacco Current   BP Readings from Last 3 Encounters:  05/17/21 108/80  03/16/21 (!) 161/81  03/10/21 130/74   Pulse Readings from Last 3 Encounters:  05/17/21 84  03/16/21 81  03/10/21 88   Wt Readings from Last 3 Encounters:  05/17/21 122 lb 6.4 oz (55.5 kg)  03/10/21 118 lb (53.5 kg)  02/09/21 126 lb (57.2 kg)   BMI Readings from Last 3 Encounters:  05/17/21 21.68 kg/m  03/10/21 20.90 kg/m  02/09/21 21.90 kg/m    Assessment/Interventions: Review of patient past medical history, allergies, medications, health status, including review of consultants reports, laboratory and other test data, was performed as part of comprehensive evaluation and provision of chronic care management services.   SDOH:  (Social Determinants of Health) assessments and interventions performed: No  SDOH Screenings   Alcohol Screen: Low Risk  (04/22/2021)   Alcohol Screen    Last Alcohol Screening Score (AUDIT): 0  Depression (PHQ2-9): Low Risk  (08/12/2020)   Depression (PHQ2-9)    PHQ-2 Score: 0  Financial Resource Strain: High Risk (08/12/2020)   Overall Financial Resource Strain (CARDIA)    Difficulty of Paying Living Expenses: Hard  Food Insecurity: Food Insecurity Present (09/23/2020)   Hunger Vital Sign    Worried About Running Out of Food in the Last Year: Often true    Ran Out of Food in the Last Year: Often true  Housing: Low Risk  (07/17/2019)   Housing    Last Housing Risk Score: 0  Physical Activity: Inactive (08/12/2020)   Exercise Vital Sign    Days of Exercise per Week: 0 days    Minutes of Exercise per Session: 0 min  Social Connections: Not on file  Stress: No  Stress Concern Present (08/12/2020)   [place]    Feeling of Stress : Not at all  Tobacco Use: High Risk (06/03/2021)   Patient History    Smoking Tobacco Use: Every Day  Smokeless Tobacco Use: Current    Passive Exposure: Not on file  Transportation Needs: No Transportation Needs (08/12/2020)   PRAPARE - Transportation    Lack of Transportation (Medical): No    Lack of Transportation (Non-Medical): No    CCM Care Plan  No Known Allergies  Medications Reviewed Today     Reviewed by Mayford Knife, RPH (Pharmacist) on 07/15/21 at 1130  Med List Status: <None>   Medication Order Taking? Sig Documenting Provider Last Dose Status Informant  amLODipine (NORVASC) 5 MG tablet [id number] No TAKE ONE TABLET BY MOUTH BEFORE Evelena Peat, FNP Taking Active   atorvastatin (LIPITOR) 80 MG tablet [id number] No TAKE ONE TABLET BY MOUTH AT BEDTIME [name], FNP Taking Active   Blood Glucose Monitoring Suppl DEVI [id number] No Check blood sugars twice daily. E11.9  Patient not taking: Reported on 03/10/2021   [name], FNP Not Taking Active Self  empagliflozin (JARDIANCE) 25 MG TABS tablet [id number] No Take 1 tablet (25 mg total) by mouth daily before breakfast. Shamleffer, Melanie Crazier, MD Taking Active   insulin glargine (LANTUS SOLOSTAR) 100 UNIT/ML Solostar Pen [id number] No Inject 35 Units into the skin daily. Shamleffer, Melanie Crazier, MD Taking Active   Insulin Pen Needle 32G X 6 MM MISC [id number] No 1 each by Does not apply route in the morning, at noon, in the evening, and at bedtime. [name], FNP Taking Active   losartan (COZAAR) 50 MG tablet [id number] No Take 1 tablet (50 mg total) by mouth daily. [name], FNP Taking Active   magnesium 30 MG tablet [id number] No Take 30 mg by mouth once a week. [provider] Taking Active Self  Multiple Vitamins-Minerals (MULTIVITAMIN WITH  MINERALS) tablet [id number] No Take 1 tablet by mouth daily. [provider] Taking Active Self  pioglitazone (ACTOS) 15 MG tablet [id number]  TAKE 1 TABLET(15 MG) BY MOUTH DAILY Shamleffer, Melanie Crazier, MD  Active   potassium chloride (KLOR-CON) 10 MEQ tablet [id number] No Take 1 tablet (10 mEq total) by mouth daily. [name], FNP Taking Active   VASCEPA 1 g capsule [id number] No TAKE 2 CAPSULES(2 GRAMS) BY MOUTH TWICE DAILY Hilty, Nadean Corwin, MD Taking Active             Patient Active Problem List   Diagnosis Date Noted   Dyslipidemia 12/09/2020   Other insomnia 10/18/2020   Uncontrolled type 2 diabetes mellitus with hyperglycemia ([place]) 10/18/2020   Tobacco abuse 07/22/2020   Type 2 diabetes mellitus ([place]) 07/22/2020   Cognitive deficits 11/07/2018   ETOH abuse 07/30/2013   Anxiety 07/30/2013   Essential hypertension 01/31/2011    Immunization History  Administered Date(s) Administered   Fluad Quad(high Dose 65+) 10/20/2019   Janssen (J&J) SARS-COV-2 Vaccination 03/17/2019   PFIZER(Purple Top)SARS-COV-2 Vaccination 01/05/2020, 07/16/2020   Pfizer Covid-19 Vaccine Bivalent Booster 5y-11y 01/11/2021   Tdap 01/25/2013, 07/29/2013    Conditions to be addressed/monitored:  Hyperlipidemia and Diabetes  Care Plan : [place]  Updates made by Mayford Knife, RPH since 07/15/2021 12:00 AM     Problem: HTN, DM II   Priority: High     Long-Range Goal: Disease Management   Recent Progress: On track  Priority: High  Note:   Current Barriers:  Unable to independently monitor therapeutic efficacy Unable to achieve control of Diabetes    Pharmacist Clinical Goal(s):  Patient will achieve adherence to monitoring guidelines and medication adherence to achieve therapeutic efficacy through collaboration with PharmD and  provider.   Interventions: 1:1 collaboration with [name], FNP regarding development and update of comprehensive plan of care as  evidenced by provider attestation and co-signature Inter-disciplinary care team collaboration (see longitudinal plan of care) Comprehensive medication review performed; medication list updated in electronic medical record  Hyperlipidemia: (LDL goal < 55) -Not ideally controlled -Current treatment: Atorvastatin 80 mg tablet daily Appropriate, Effective, Safe, Accessible Vascepa 1 gram capsule- take 2 capsules by mouth twice daily Appropriate, Effective, Safe, Accessible  -Current dietary patterns: Breakfast, lunch, dinner  -Current exercise habits: did not discuss -Educated on Cholesterol goals;  Benefits of statin for ASCVD risk reduction; -Recommended to continue current medication  Diabetes (A1c goal <7%) -Not ideally controlled -Current medications: Jardiance 25 mg tablet once per day Appropriate, Effective, Safe, Accessible Lantus 100 unit/ ml - 35 units daily Appropriate, Effective, Safe, Accessible Actos 15 mg tablet daily Appropriate, Effective, Safe, Accessible -Current home glucose readings: patient reports that she is not using her glucose meter anymore, because it is really disctracting and she goes by the way she feels. She reports that when her blood sugar is off she might feel hot.  -Denies hypoglycemic/hyperglycemic symptoms  -Current meal patterns:  lunch: varies: fish, chicken, hamburgers   -Current exercise: will discuss more during next office visit -Educated on Complications of diabetes including kidney damage, retinal damage, and cardiovascular disease; Prevention and management of hypoglycemic episodes; Benefits of routine self-monitoring of blood sugar; -At this time she is not interested in checking her BS through pricking her finger or CGM.  -She is willing to check her BS twice per week, I explained that the only method that she would be able to use where she checks her BS twice per week would be through the finger prick method. She voiced understanding and  does not want to use this method.  -I explained the difference between A1c and glucose readings from CGM. I also described the importance of checking her BS at least 4-5 times per day with this device for accuracy. She was open to this but needs helps with placing the app on her phone for easy use.  -I recommended that she come into the PCP ofice to be taught. She reports that she is only available on Thursday morning, I sent a secure chat to PCP team to set up a nurse visit.  -Patient confirmed that she was okay with me leaving a VM on her phone with appointment details on Tuesday.  -Counseled to check feet daily and get yearly eye exams -Recommended to continue current medication  Patient Goals/Self-Care Activities Patient will:  - take medications as prescribed as evidenced by patient report and record review  Follow Up Plan: The patient has been provided with contact information for the care management team and has been advised to call with any health related questions or concerns.       Medication Assistance: None required.  Patient affirms current coverage meets needs.  Compliance/Adherence/Medication fill history: Care Gaps: Colonoscopy  COVID-19 Booster  Star-Rating Drugs: Atorvastatin 80 mg tablet  Jardiance 25 mg tablet   Patient's preferred pharmacy is:  [facility] [place], [address] [place] [address] Phone: [phone] Fax: [phone]  [facility] - [place], [place] - [address] [place] [address] Phone: [phone] Fax: [phone]  Uses pill box? No - patient  keeps her medication in vials  Pt endorses 80% compliance  We discussed: Benefits of medication synchronization, packaging and delivery as well as enhanced pharmacist oversight with [facility]. Patient decided to: Continue current medication  management strategy  Care Plan and Follow Up Patient Decision:  Patient agrees to Care Plan and Follow-up.  Plan: The patient has been provided with contact information for the care management team and has been advised to call with any health related questions or concerns.   [name], CPP, PharmD Clinical Pharmacist Practitioner [facility] [phone]

## 2021-07-17 ENCOUNTER — Emergency Department (HOSPITAL_COMMUNITY): Payer: Medicare PPO

## 2021-07-17 ENCOUNTER — Emergency Department (HOSPITAL_COMMUNITY)
Admission: EM | Admit: 2021-07-17 | Discharge: 2021-07-17 | Disposition: A | Discharge status: Home / Self Care | Payer: Medicare PPO | Attending: Student | Admitting: Student

## 2021-07-17 ENCOUNTER — Encounter (HOSPITAL_COMMUNITY): Payer: Self-pay

## 2021-07-17 DIAGNOSIS — E876 Hypokalemia: Secondary | ICD-10-CM | POA: Diagnosis not present

## 2021-07-17 DIAGNOSIS — R21 Rash and other nonspecific skin eruption: Secondary | ICD-10-CM | POA: Insufficient documentation

## 2021-07-17 DIAGNOSIS — Z79899 Other long term (current) drug therapy: Secondary | ICD-10-CM | POA: Diagnosis not present

## 2021-07-17 DIAGNOSIS — F17210 Nicotine dependence, cigarettes, uncomplicated: Secondary | ICD-10-CM | POA: Diagnosis not present

## 2021-07-17 DIAGNOSIS — N390 Urinary tract infection, site not specified: Secondary | ICD-10-CM | POA: Diagnosis not present

## 2021-07-17 DIAGNOSIS — R1033 Periumbilical pain: Secondary | ICD-10-CM | POA: Diagnosis present

## 2021-07-17 DIAGNOSIS — Z7984 Long term (current) use of oral hypoglycemic drugs: Secondary | ICD-10-CM | POA: Diagnosis not present

## 2021-07-17 DIAGNOSIS — R109 Unspecified abdominal pain: Secondary | ICD-10-CM | POA: Diagnosis not present

## 2021-07-17 DIAGNOSIS — Z794 Long term (current) use of insulin: Secondary | ICD-10-CM | POA: Diagnosis not present

## 2021-07-17 DIAGNOSIS — R8289 Other abnormal findings on cytological and histological examination of urine: Secondary | ICD-10-CM | POA: Insufficient documentation

## 2021-07-17 DIAGNOSIS — D72829 Elevated white blood cell count, unspecified: Secondary | ICD-10-CM | POA: Insufficient documentation

## 2021-07-17 DIAGNOSIS — K861 Other chronic pancreatitis: Secondary | ICD-10-CM | POA: Diagnosis not present

## 2021-07-17 DIAGNOSIS — B0229 Other postherpetic nervous system involvement: Secondary | ICD-10-CM

## 2021-07-17 DIAGNOSIS — E119 Type 2 diabetes mellitus without complications: Secondary | ICD-10-CM | POA: Insufficient documentation

## 2021-07-17 DIAGNOSIS — R1030 Lower abdominal pain, unspecified: Secondary | ICD-10-CM

## 2021-07-17 DIAGNOSIS — R1032 Left lower quadrant pain: Secondary | ICD-10-CM | POA: Diagnosis not present

## 2021-07-17 DIAGNOSIS — I10 Essential (primary) hypertension: Secondary | ICD-10-CM | POA: Diagnosis not present

## 2021-07-17 DIAGNOSIS — I700 Atherosclerosis of aorta: Secondary | ICD-10-CM | POA: Diagnosis not present

## 2021-07-17 LAB — URINALYSIS, ROUTINE W REFLEX MICROSCOPIC
Bacteria, UA: NONE SEEN
Bilirubin Urine: NEGATIVE
Glucose, UA: >=500 mg/dL — AB
Ketones, ur: NEGATIVE mg/dL
Nitrite: NEGATIVE
Protein, ur: NEGATIVE mg/dL
Specific Gravity, Urine: 1.03 (ref 1.005–1.030)
pH: 6 (ref 5.0–8.0)

## 2021-07-17 LAB — CBC WITH DIFFERENTIAL/PLATELET
Abs Immature Granulocytes: 0.04 10*3/uL (ref 0.00–0.07)
Basophils Absolute: 0.1 10*3/uL (ref 0.0–0.1)
Basophils Relative: 1 %
Eosinophils Absolute: 0.1 10*3/uL (ref 0.0–0.5)
Eosinophils Relative: 1 %
HCT: 42 % (ref 36.0–46.0)
Hemoglobin: 13.5 g/dL (ref 12.0–15.0)
Immature Granulocytes: 0 %
Lymphocytes Relative: 25 %
Lymphs Abs: 2.7 10*3/uL (ref 0.7–4.0)
MCH: 29 pg (ref 26.0–34.0)
MCHC: 32.1 g/dL (ref 30.0–36.0)
MCV: 90.1 fL (ref 80.0–100.0)
Monocytes Absolute: 0.7 10*3/uL (ref 0.1–1.0)
Monocytes Relative: 7 %
Neutro Abs: 7.1 10*3/uL (ref 1.7–7.7)
Neutrophils Relative %: 66 %
Platelets: 109 10*3/uL — ABNORMAL LOW (ref 150–400)
RBC: 4.66 MIL/uL (ref 3.87–5.11)
RDW: 15.2 % (ref 11.5–15.5)
WBC: 10.8 10*3/uL — ABNORMAL HIGH (ref 4.0–10.5)
nRBC: 0 % (ref 0.0–0.2)

## 2021-07-17 LAB — COMPREHENSIVE METABOLIC PANEL
ALT: 21 U/L (ref 0–44)
AST: 24 U/L (ref 15–41)
Albumin: 3.9 g/dL (ref 3.5–5.0)
Alkaline Phosphatase: 100 U/L (ref 38–126)
Anion gap: 8 (ref 5–15)
BUN: 13 mg/dL (ref 8–23)
CO2: 25 mmol/L (ref 22–32)
Calcium: 9.4 mg/dL (ref 8.9–10.3)
Chloride: 105 mmol/L (ref 98–111)
Creatinine, Ser: 0.5 mg/dL (ref 0.44–1.00)
GFR, Estimated: >60 mL/min (ref >60)
Glucose, Bld: 110 mg/dL — ABNORMAL HIGH (ref 70–99)
Potassium: 3.4 mmol/L — ABNORMAL LOW (ref 3.5–5.1)
Sodium: 138 mmol/L (ref 135–145)
Total Bilirubin: 0.5 mg/dL (ref 0.3–1.2)
Total Protein: 7.3 g/dL (ref 6.5–8.1)

## 2021-07-17 LAB — LIPASE, BLOOD: Lipase: 35 U/L (ref 11–51)

## 2021-07-17 MED ORDER — DICYCLOMINE HCL 20 MG PO TABS: 20.0000 mg | ORAL_TABLET | Freq: Two times a day (BID) | ORAL | 0 refills | Status: DC

## 2021-07-17 MED ORDER — HYDROMORPHONE HCL 1 MG/ML IJ SOLN
1.0000 mg | Freq: Once | INTRAMUSCULAR | Status: AC
  Administered 2021-07-17: 1 mg via INTRAVENOUS
  Filled 2021-07-17: qty 1

## 2021-07-17 MED ORDER — HYDROCODONE-ACETAMINOPHEN 5-325 MG PO TABS: 2.0000 | ORAL_TABLET | ORAL | 0 refills | Status: DC | PRN

## 2021-07-17 MED ORDER — LIDOCAINE 5 % EX PTCH
1.0000 | MEDICATED_PATCH | CUTANEOUS | Status: DC
  Administered 2021-07-17: 1 via TRANSDERMAL
  Filled 2021-07-17: qty 1

## 2021-07-17 MED ORDER — CEFADROXIL 500 MG PO CAPS
500.0000 mg | ORAL_CAPSULE | Freq: Once | ORAL | Status: AC
  Administered 2021-07-17: 500 mg via ORAL
  Filled 2021-07-17: qty 1

## 2021-07-17 MED ORDER — ONDANSETRON HCL 4 MG/2ML IJ SOLN
4.0000 mg | Freq: Once | INTRAMUSCULAR | Status: AC
  Administered 2021-07-17: 4 mg via INTRAVENOUS
  Filled 2021-07-17: qty 2

## 2021-07-17 MED ORDER — VALACYCLOVIR HCL 1 G PO TABS: 1000.0000 mg | ORAL_TABLET | Freq: Three times a day (TID) | ORAL | 0 refills | Status: AC

## 2021-07-17 MED ORDER — VALACYCLOVIR HCL 500 MG PO TABS
1000.0000 mg | ORAL_TABLET | Freq: Once | ORAL | Status: AC
  Administered 2021-07-17: 1000 mg via ORAL
  Filled 2021-07-17: qty 2

## 2021-07-17 MED ORDER — IOHEXOL 300 MG/ML  SOLN
60.0000 mL | Freq: Once | INTRAMUSCULAR | Status: AC | PRN
  Administered 2021-07-17: 60 mL via INTRAVENOUS

## 2021-07-17 MED ORDER — LIDOCAINE 5 % EX PTCH: 1.0000 | MEDICATED_PATCH | CUTANEOUS | 0 refills | Status: DC

## 2021-07-17 MED ORDER — MORPHINE SULFATE (PF) 4 MG/ML IV SOLN
4.0000 mg | Freq: Once | INTRAVENOUS | Status: AC
  Administered 2021-07-17: 4 mg via INTRAVENOUS
  Filled 2021-07-17: qty 1

## 2021-07-17 MED ORDER — LACTATED RINGERS IV BOLUS
1000.0000 mL | Freq: Once | INTRAVENOUS | Status: AC
  Administered 2021-07-17: 1000 mL via INTRAVENOUS

## 2021-07-17 MED ORDER — SODIUM CHLORIDE (PF) 0.9 % IJ SOLN
INTRAMUSCULAR | Status: AC
  Filled 2021-07-17: qty 100

## 2021-07-17 MED ORDER — CEFADROXIL 500 MG PO CAPS: 500.0000 mg | ORAL_CAPSULE | Freq: Two times a day (BID) | ORAL | 0 refills | Status: AC

## 2021-07-17 NOTE — ED Provider Triage Note (Signed)
Emergency Medicine Provider Triage Evaluation Note  Gabriella Acosta , a 76 y.o. female  was evaluated in triage.  Pt complains of abd pain. Report periumbilical abd pain x 3 weeks, wax and wane.  No fever, n/v/d, dysuria  Review of Systems  Positive: As above Negative: As above  Physical Exam  BP (!) 162/81 (BP Location: Right Arm)   Pulse 73   Temp 98.1 F (36.7 C) (Oral)   Resp 18   SpO2 100%  Gen:   Awake, no distress   Resp:  Normal effort  MSK:   Moves extremities without difficulty  Other:    Medical Decision Making  Medically screening exam initiated at 1:46 PM.  Appropriate orders placed.  Gabriella Acosta was informed that the remainder of the evaluation will be completed by another provider, this initial triage assessment does not replace that evaluation, and the importance of remaining in the ED until their evaluation is complete.     Fayrene Helper, PA-C 07/17/21 1347

## 2021-07-17 NOTE — ED Notes (Signed)
Pt care taken, waiting for ct scan

## 2021-07-17 NOTE — ED Triage Notes (Signed)
Pt presents with c/o abdominal pain for approx 3 weeks. Pt denies any N/V.
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
## 2021-07-18 ENCOUNTER — Ambulatory Visit: Payer: Medicare PPO | Admitting: Internal Medicine
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
## 2021-07-19 ENCOUNTER — Telehealth: Payer: Self-pay

## 2021-07-19 NOTE — Telephone Encounter (Signed)
Spoke with patient after visiting the ED, patient reports she does not need an appointment at this time. Patient notified if any questions, concerns or if she would like an appointment to give the office a call.

## 2021-07-21 ENCOUNTER — Ambulatory Visit: Payer: Medicare PPO | Admitting: Internal Medicine

## 2021-07-21 ENCOUNTER — Encounter: Payer: Self-pay | Admitting: Internal Medicine

## 2021-07-21 VITALS — BP 120/70 | HR 63 | Ht 63.0 in | Wt 122.8 lb

## 2021-07-21 DIAGNOSIS — E1165 Type 2 diabetes mellitus with hyperglycemia: Secondary | ICD-10-CM

## 2021-07-21 DIAGNOSIS — R6889 Other general symptoms and signs: Secondary | ICD-10-CM | POA: Diagnosis not present

## 2021-07-21 LAB — POCT GLUCOSE (DEVICE FOR HOME USE): Glucose Fasting, POC: 148 mg/dL — AB (ref 70–99)

## 2021-07-21 LAB — T4, FREE: Free T4: 0.86 ng/dL (ref 0.60–1.60)

## 2021-07-21 LAB — POCT GLYCOSYLATED HEMOGLOBIN (HGB A1C): Hemoglobin A1C: 9.3 % — AB (ref 4.0–5.6)

## 2021-07-21 LAB — TSH: TSH: 2.1 u[IU]/mL (ref 0.35–5.50)

## 2021-07-21 MED ORDER — CEQUR SIMPLICITY 2U DEVI
1.0000 | Freq: Once | 3 refills | Status: AC
Start: 2021-07-21 — End: 2021-07-21

## 2021-07-21 MED ORDER — CEQUR SIMPLICITY INSERTER MISC: 1.0000 | Freq: Every day | 0 refills | Status: DC
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
## 2021-07-21 NOTE — Patient Instructions (Signed)
-   Continue Lantus 35 units daily  - Continue  Jardiance 25 mg daily  - Continue Pioglitazone 15 mg daily           HOW TO TREAT LOW BLOOD SUGARS (Blood sugar LESS THAN 70 MG/DL) Please follow the RULE OF 15 for the treatment of hypoglycemia treatment (when your (blood sugars are less than 70 mg/dL)   STEP 1: Take 15 grams of carbohydrates when your blood sugar is low, which includes:  3-4 GLUCOSE TABS  OR 3-4 OZ OF JUICE OR REGULAR SODA OR ONE TUBE OF GLUCOSE GEL    STEP 2: RECHECK blood sugar in 15 MINUTES STEP 3: If your blood sugar is still low at the 15 minute recheck --> then, go back to STEP 1 and treat AGAIN with another 15 grams of carbohydrates.

## 2021-07-22 ENCOUNTER — Inpatient Hospital Stay: Admission: RE | Admit: 2021-07-22 | Payer: Medicare PPO | Source: Ambulatory Visit

## 2021-07-25 ENCOUNTER — Telehealth: Payer: Self-pay

## 2021-07-25 NOTE — Telephone Encounter (Signed)
Patient reports her sugars have been dropping low.   07/23/21  8:30am 65  07/24/21  9am 67   07/25/21   9am 99   Patient is taking the Lantus 35 units at bedtime and Jardiance and Actos after breakfast.   Patient also states that she is having stomach pain possible from shingles and was advised by her pcp to contact us.

## 2021-07-25 NOTE — Telephone Encounter (Signed)
Patient notified and will make medication adjustment  

## 2021-07-28 ENCOUNTER — Ambulatory Visit: Payer: Self-pay

## 2021-07-28 ENCOUNTER — Telehealth: Payer: Medicare PPO

## 2021-07-28 ENCOUNTER — Telehealth: Payer: Self-pay

## 2021-07-28 DIAGNOSIS — E1165 Type 2 diabetes mellitus with hyperglycemia: Secondary | ICD-10-CM

## 2021-07-28 DIAGNOSIS — I10 Essential (primary) hypertension: Secondary | ICD-10-CM

## 2021-07-28 DIAGNOSIS — E782 Mixed hyperlipidemia: Secondary | ICD-10-CM

## 2021-07-28 DIAGNOSIS — Z720 Tobacco use: Secondary | ICD-10-CM

## 2021-07-28 NOTE — Telephone Encounter (Signed)
[facility] is requesting a drug change on the Cequr Simplicity Patch 2U Kit.  Change we find out what the preferred is.  

## 2021-07-28 NOTE — Telephone Encounter (Signed)
Script canceled

## 2021-07-29 NOTE — Patient Instructions (Signed)
Visit Information  Thank you for taking time to visit with me today. Please don't hesitate to contact me if I can be of assistance to you before our next scheduled telephone appointment.  Following are the goals we discussed today:  Take all medications as prescribed Attend all scheduled provider appointments Call pharmacy for medication refills 3-7 days in advance of running out of medications Perform all self care activities independently  Call provider office for new concerns or questions  drink 6 to 8 glasses of water each day manage portion size take all medications exactly as prescribed call doctor with any symptoms you believe are related to your medicine call doctor when you experience any new symptoms adhere to prescribed diet: low Sodium, low fat Schedule your lung CT scan and bone density tests when ready   If you are experiencing a Mental Health or Behavioral Health Crisis or need someone to talk to, please call 1-800-273-TALK (toll free, 24 hour hotline)   Patient verbalizes understanding of instructions and care plan provided today and agrees to view in MyChart. Active MyChart status and patient understanding of how to access instructions and care plan via MyChart confirmed with patient.     Delsa Sale, RN, BSN, CCM Care Management Coordinator [facility]  Direct Phone: [phone]

## 2021-07-29 NOTE — Chronic Care Management (AMB) (Signed)
Care Management    RN Visit Note  07/28/2021 Name: Gabriella Acosta MRN: [id number] DOB: [birth year]  Subjective: Gabriella Acosta is a 76 y.o. year old female who is a primary care patient of [name], [place]. The care management team was consulted for assistance with disease management and care coordination needs.    Engaged with patient by telephone for follow up visit in response to provider referral for case management and/or care coordination services.   Consent to Services:   [name] was given information about Care Management services today including:  Care Management services includes personalized support from designated clinical staff supervised by her physician, including individualized plan of care and coordination with other care providers 24/7 contact phone numbers for assistance for urgent and routine care needs. The patient may stop case management services at any time by phone call to the office staff.  Patient agreed to services and consent obtained.   Assessment: Review of patient past medical history, allergies, medications, health status, including review of consultants reports, laboratory and other test data, was performed as part of comprehensive evaluation and provision of chronic care management services.   SDOH (Social Determinants of Health) assessments and interventions performed:  Yes, no acute needs   Care Plan  No Known Allergies  Outpatient Encounter Medications as of 07/28/2021  Medication Sig   amLODipine (NORVASC) 5 MG tablet TAKE ONE TABLET BY MOUTH BEFORE BREAKFAST   atorvastatin (LIPITOR) 80 MG tablet TAKE ONE TABLET BY MOUTH AT BEDTIME   Blood Glucose Monitoring Suppl DEVI Check blood sugars twice daily. E11.9   dicyclomine (BENTYL) 20 MG tablet Take 1 tablet (20 mg total) by mouth 2 (two) times daily.   empagliflozin (JARDIANCE) 25 MG TABS tablet Take 1 tablet (25 mg total) by mouth daily before breakfast.   HYDROcodone-acetaminophen  (NORCO/VICODIN) 5-325 MG tablet Take 2 tablets by mouth every 4 (four) hours as needed.   Injection Device for Insulin (CEQUR SIMPLICITY INSERTER) MISC 1 Device by Does not apply route daily in the afternoon.   insulin glargine (LANTUS SOLOSTAR) 100 UNIT/ML Solostar Pen Inject 35 Units into the skin daily.   Insulin Pen Needle 32G X 6 MM MISC 1 each by Does not apply route in the morning, at noon, in the evening, and at bedtime.   lidocaine (LIDODERM) 5 % Place 1 patch onto the skin daily. Remove & Discard patch within 12 hours or as directed by MD   losartan (COZAAR) 50 MG tablet Take 1 tablet (50 mg total) by mouth daily.   magnesium 30 MG tablet Take 30 mg by mouth once a week.   Multiple Vitamins-Minerals (MULTIVITAMIN WITH MINERALS) tablet Take 1 tablet by mouth daily.   pioglitazone (ACTOS) 15 MG tablet TAKE 1 TABLET(15 MG) BY MOUTH DAILY   potassium chloride (KLOR-CON) 10 MEQ tablet Take 1 tablet (10 mEq total) by mouth daily.   VASCEPA 1 g capsule TAKE 2 CAPSULES(2 GRAMS) BY MOUTH TWICE DAILY   No facility-administered encounter medications on file as of 07/28/2021.    Patient Active Problem List   Diagnosis Date Noted   Dyslipidemia 12/09/2020   Other insomnia 10/18/2020   Uncontrolled type 2 diabetes mellitus with hyperglycemia ([place]) 10/18/2020   Tobacco abuse 07/22/2020   Type 2 diabetes mellitus ([place]) 07/22/2020   Cognitive deficits 11/07/2018   ETOH abuse 07/30/2013   Anxiety 07/30/2013   Essential hypertension 01/31/2011    Conditions to be addressed/monitored: DM II, HTN, Mixed hyperlipidemia, Tobacco Use  Care Plan : RN Care Manager Plan of Care  Updates made by [name], RN since 07/28/2021 12:00 AM  Completed 07/29/2021   Problem: No plan of care established for management of chronic disease states (DM II, HTN, Mixed hyperlipidemia, Tobacco Use) Resolved 07/28/2021  Priority: High     Long-Range Goal: Development of plan of care for chronic disease  management for DM II, HTN, Mixed hyperlipidemia, Tobacco Use Completed 07/28/2021  Start Date: 12/13/2020  Expected End Date: 12/13/2021  Recent Progress: On track  Priority: High  Note:   Current Barriers:  Knowledge Deficits related to plan of care for management of DM II, HTN, Mixed hyperlipidemia, Tobacco Use   Chronic Disease Management support and education needs related to DM II, HTN, Mixed hyperlipidemia, Tobacco Use   Financial Restraints   RNCM Clinical Goal(s):  Patient will verbalize basic understanding of  DM II, HTN, Mixed hyperlipidemia, Tobacco Use  disease process and self health management plan as evidenced by patient will report having no disease exacerbations related to her chronic disease states listed above  take all medications exactly as prescribed and will call provider for medication related questions as evidenced by patient will report having no missed doses of her prescribed medications demonstrate Improved health management independence as evidenced by patient will report 100% adherence to following her prescribed treatment plan as directed  continue to work with RN Care Manager to address care management and care coordination needs related to  DM II, HTN, Mixed hyperlipidemia, Tobacco Use  as evidenced by adherence to CM Team Scheduled appointments demonstrate ongoing self health care management ability   as evidenced by    through collaboration with RN Care manager, provider, and care team.   Interventions: 1:1 collaboration with primary care provider regarding development and update of comprehensive plan of care as evidenced by provider attestation and co-signature Inter-disciplinary care team collaboration (see longitudinal plan of care) Evaluation of current treatment plan related to  self management and patient's adherence to plan as established by provider  Diabetes Interventions:  (Status:  Goal Met.) Long Term Goal Assessed patient's understanding of A1c  goal: <7% Provided education to patient about basic DM disease process Reviewed medications with patient and discussed importance of medication adherence Counseled on importance of regular laboratory monitoring as prescribed Provided patient with written educational materials related to hypo and hyperglycemia and importance of correct treatment Advised patient, providing education and rationale, to check cbg daily before meals and at bedtime and record, calling PCP for findings outside established parameters Review of patient status, including review of consultants reports, relevant laboratory and other test results, and medications completed Screening for signs and symptoms of depression related to chronic disease state  Assessed social determinant of health barriers Lab Results  Component Value Date   HGBA1C 9.3 (A) 07/21/2021   Hyperlipidemia Interventions:  (Status:  Goal Met.) Long Term Goal Medication review performed; medication list updated in electronic medical record.  Provider established cholesterol goals reviewed Counseled on importance of regular laboratory monitoring as prescribed Provided HLD educational materials Reviewed importance of limiting foods high in cholesterol Reviewed exercise goals and target of 150 minutes per week Lipid Panel     Component Value Date/Time   CHOL 149 12/09/2020 0856   CHOL 185 03/25/2020 1213   TRIG 174.0 (H) 12/09/2020 0856   HDL 47.50 12/09/2020 0856   HDL 40 03/25/2020 1213   CHOLHDL 3 12/09/2020 0856   VLDL 34.8 12/09/2020 0856   LDLCALC 67  12/09/2020 0856   LDLCALC 91 03/25/2020 1213   LABVLDL 54 (H) 03/25/2020 1213      Herpes Zoster without complications Interventions:  (Status:  Goal Met.)  Short Term Goal Evaluation of current treatment plan related to  Shingles outbreak ,  self-management and patient's adherence to plan as established by provider Determined patient's Shingles have resolved, unfortunately patient was  diagnosed with post herpetic neuralgia around her umbilicus  Reviewed and discussed with patient doctor recommendations for use of Lidocaine patches to help with pain Reviewed and discussed with patient an Rx for Lidocaine patches has been sent to her pharmacy when ready to fill  Assessed for patient understanding of her prescribed treatment plan, she verbalizes understanding and denies having questions at this time   Discussed plans with patient for ongoing care management follow up and provided patient with direct contact information for care management team  Ineffective Health Maintenance Interventions:  (Status:  Goal Met.)  Short Term Goal Completed successful outbound call with patient, she asked to keep the call brief due to being at work  Evaluation of current treatment plan related to  Ineffective Health Maintenance , self-management and patient's adherence to plan as established by provider Determined patient is up to date with her MD follow up appointments as prescribed  Discussed with patient referrals for a lung cancer screening and bone density scan Confirmed patient has the contact number and location for which to receive these tests and she plans to call to schedule these tests in the near future  Encouraged patient to attend all scheduled provider appointments as directed  Assessed for SDOH barriers, none identified at this time  Discussed plans with patient for ongoing care management follow up and provided patient with direct contact information for care management team  Patient Goals/Self-Care Activities: Take all medications as prescribed Attend all scheduled provider appointments Call pharmacy for medication refills 3-7 days in advance of running out of medications Perform all self care activities independently  Call provider office for new concerns or questions  drink 6 to 8 glasses of water each day manage portion size take all medications exactly as prescribed call  doctor with any symptoms you believe are related to your medicine call doctor when you experience any new symptoms adhere to prescribed diet: low Sodium, low fat Schedule your lung CT scan and bone density tests when ready   Follow Up Plan:  No further follow up required     Barb Merino, RN, BSN, CCM Care Management Coordinator [facility]  Direct Phone: [phone]

## 2021-08-03 ENCOUNTER — Telehealth: Payer: Self-pay

## 2021-08-03 NOTE — Chronic Care Management (AMB) (Addendum)
Chronic Care Management Pharmacy Assistant   Name: Gabriella Acosta  MRN: [id number] DOB: 05/04/1945  Reason for Encounter: Medication Review/ Medication coordination  Recent office visits:  07-28-2021 Riley Churches, RN (CCM)  Recent consult visits:  07-21-2021 Shamleffer, Konrad Dolores, MD (Endo). Glucose= 148. A1C= 9.3. Referral placed to nutrition and diabetes.  Hospital visits:  Medication Reconciliation was completed by comparing discharge summary, patient's EMR and Pharmacy list, and upon discussion with patient.  Admitted to the hospital on 07-17-2021 due to abdominal pain. Discharge date was 07-17-2021. Discharged from [facility].    New?Medications Started at [facility] Discharge:?? Cefadroxil 500 mg twice daily dicyclomine 20 mg twice daily HYDROcodone-acetaminophen 5-325 mg 2 tablets every 4 hours PRN lidocaine 5% Place 1 patch onto the skin daily. Remove & Discard patch within 12 hours  valACYclovir 1,000 mg 3 times daily  Medication Changes at Hospital Discharge: None  Medications Discontinued at Hospital Discharge: None  Medications that remain the same after Hospital Discharge:??  -All other medications will remain the same.    Medications: Outpatient Encounter Medications as of 08/03/2021  Medication Sig   amLODipine (NORVASC) 5 MG tablet TAKE ONE TABLET BY MOUTH BEFORE BREAKFAST   atorvastatin (LIPITOR) 80 MG tablet TAKE ONE TABLET BY MOUTH AT BEDTIME   Blood Glucose Monitoring Suppl DEVI Check blood sugars twice daily. E11.9   dicyclomine (BENTYL) 20 MG tablet Take 1 tablet (20 mg total) by mouth 2 (two) times daily.   empagliflozin (JARDIANCE) 25 MG TABS tablet Take 1 tablet (25 mg total) by mouth daily before breakfast.   HYDROcodone-acetaminophen (NORCO/VICODIN) 5-325 MG tablet Take 2 tablets by mouth every 4 (four) hours as needed.   Injection Device for Insulin (CEQUR SIMPLICITY INSERTER) MISC 1 Device by Does not apply route daily in  the afternoon.   insulin glargine (LANTUS SOLOSTAR) 100 UNIT/ML Solostar Pen Inject 35 Units into the skin daily.   Insulin Pen Needle 32G X 6 MM MISC 1 each by Does not apply route in the morning, at noon, in the evening, and at bedtime.   lidocaine (LIDODERM) 5 % Place 1 patch onto the skin daily. Remove & Discard patch within 12 hours or as directed by MD   losartan (COZAAR) 50 MG tablet Take 1 tablet (50 mg total) by mouth daily.   magnesium 30 MG tablet Take 30 mg by mouth once a week.   Multiple Vitamins-Minerals (MULTIVITAMIN WITH MINERALS) tablet Take 1 tablet by mouth daily.   pioglitazone (ACTOS) 15 MG tablet TAKE 1 TABLET(15 MG) BY MOUTH DAILY   potassium chloride (KLOR-CON) 10 MEQ tablet Take 1 tablet (10 mEq total) by mouth daily.   VASCEPA 1 g capsule TAKE 2 CAPSULES(2 GRAMS) BY MOUTH TWICE DAILY   No facility-administered encounter medications on file as of 08/03/2021.  Reviewed chart for medication changes ahead of medication coordination call.  BP Readings from Last 3 Encounters:  07/21/21 120/70  07/17/21 (!) 163/86  05/17/21 108/80    Lab Results  Component Value Date   HGBA1C 9.3 (A) 07/21/2021     Patient obtains medications through Adherence Packaging  90 Days   Last adherence delivery included:  Losartan 50 mg daily at breakfast Atorvastatin 80 mg daily before breakfast Niacin Er 500 mg daily at bedtime Potassium Chloride 10 mg daily before breakfast Jardiance 10 mg daily at breakfast Amlodipine 5 mg daily before breakfast  Patient declined (meds) last delivery: None  Patient is due for next adherence delivery on:  08-15-2021  Called patient and reviewed medications and coordinated delivery.  This delivery to include: Losartan 50 mg daily at breakfast Atorvastatin 80 mg daily before breakfast Niacin Er 500 mg daily at bedtime Potassium Chloride 10 mg daily before breakfast Jardiance 25 mg daily at breakfast Amlodipine 5 mg daily before  breakfast   Patient declined the following medications: Unable to reach patient  Patient needs refills for: Sent Losartan Atorvastatin  Potassium Chloride  Amlodipine   Unable to confirmed delivery date of 08-15-2021  08-03-2021: 1st attempt VM full 08-04-2021: 2nd attempt VM full 08-05-2021: 3rd attempt VM full. Also called for appointment reminder with [name] on 08-09-2021 at 3:00.  NOTES: Patient's niacin was discontinued and Jardiance increased to 25 mg.  Care Gaps: Colonoscopy overdue Covid booster overdue AWV 09-21-2021  Star Rating Drugs: Actos 15 mg- Last filled 06-22-2021 90 [facility] Losartan 50 mg- Last filled 05-11-2021 90 [facility] Atorvastatin 80 mg- Last filled 05-11-2021 90 [facility] Jardiance 25 mg- Last filled 05-25-2021 90 DS [facility]  [facility] CMA Clinical Pharmacist Assistant [phone]

## 2021-08-05 ENCOUNTER — Telehealth: Payer: Self-pay

## 2021-08-05 DIAGNOSIS — E782 Mixed hyperlipidemia: Secondary | ICD-10-CM

## 2021-08-05 DIAGNOSIS — I10 Essential (primary) hypertension: Secondary | ICD-10-CM

## 2021-08-05 MED ORDER — LOSARTAN POTASSIUM 50 MG PO TABS: 50.0000 mg | ORAL_TABLET | Freq: Every day | ORAL | 1 refills | Status: DC

## 2021-08-05 MED ORDER — POTASSIUM CHLORIDE CRYS ER 10 MEQ PO TBCR: 10.0000 meq | EXTENDED_RELEASE_TABLET | Freq: Every day | ORAL | 1 refills | Status: DC

## 2021-08-05 MED ORDER — ATORVASTATIN CALCIUM 80 MG PO TABS: ORAL_TABLET | ORAL | 1 refills | Status: DC

## 2021-08-05 MED ORDER — AMLODIPINE BESYLATE 5 MG PO TABS: ORAL_TABLET | ORAL | 2 refills | Status: DC

## 2021-08-05 NOTE — Telephone Encounter (Signed)
Patient needs refills of medications including the following:   Atorvastatin  80 mg tablet  Potassium Chloride 10 meQ Amlodipine 5 mg tablet Losartan 50 mg tablet  PCP agreed with refill request.

## 2021-08-09 ENCOUNTER — Ambulatory Visit (INDEPENDENT_AMBULATORY_CARE_PROVIDER_SITE_OTHER): Payer: Medicare PPO

## 2021-08-09 ENCOUNTER — Other Ambulatory Visit: Payer: Self-pay

## 2021-08-09 DIAGNOSIS — I10 Essential (primary) hypertension: Secondary | ICD-10-CM

## 2021-08-09 DIAGNOSIS — E782 Mixed hyperlipidemia: Secondary | ICD-10-CM

## 2021-08-09 DIAGNOSIS — E1165 Type 2 diabetes mellitus with hyperglycemia: Secondary | ICD-10-CM

## 2021-08-09 MED ORDER — POTASSIUM CHLORIDE CRYS ER 10 MEQ PO TBCR: 10.0000 meq | EXTENDED_RELEASE_TABLET | Freq: Every day | ORAL | 1 refills | Status: DC

## 2021-08-09 MED ORDER — LOSARTAN POTASSIUM 50 MG PO TABS: 50.0000 mg | ORAL_TABLET | Freq: Every day | ORAL | 1 refills | Status: DC

## 2021-08-09 MED ORDER — EMPAGLIFLOZIN 25 MG PO TABS: 25.0000 mg | ORAL_TABLET | Freq: Every day | ORAL | 3 refills | Status: DC

## 2021-08-09 MED ORDER — AMLODIPINE BESYLATE 5 MG PO TABS: ORAL_TABLET | ORAL | 2 refills | Status: DC

## 2021-08-09 MED ORDER — ATORVASTATIN CALCIUM 80 MG PO TABS: ORAL_TABLET | ORAL | 1 refills | Status: DC

## 2021-08-09 NOTE — Patient Instructions (Signed)
Visit Information It was great speaking with you today!  Please let me know if you have any questions about our visit.   Goals Addressed             This Visit's Progress    Manage My Medicine       Timeframe:  Long-Range Goal Priority:  High Start Date: 05/05/2020                            Expected End Date: 10/2021                     Follow Up Date:10/26/2021  In Progress:  - call for medicine refill 2 or 3 days before it runs out - keep a list of all the medicines I take; vitamins and herbals too - use a pillbox to sort medicine - use an alarm clock or phone to remind me to take my medicine    Why is this important?   These steps will help you keep on track with your medicines.           Patient Care Plan: CCM Pharmacy Care Plan     Problem Identified: HTN, DM II   Priority: High     Long-Range Goal: Disease Management   Recent Progress: On track  Priority: High  Note:    Current Barriers:  Unable to independently monitor therapeutic efficacy Unable to achieve control of diabetes   Pharmacist Clinical Goal(s):  Patient will achieve adherence to monitoring guidelines and medication adherence to achieve therapeutic efficacy through collaboration with PharmD and provider.   Interventions: 1:1 collaboration with [name], FNP regarding development and update of comprehensive plan of care as evidenced by provider attestation and co-signature Inter-disciplinary care team collaboration (see longitudinal plan of care) Comprehensive medication review performed; medication list updated in electronic medical record   Hyperlipidemia: (LDL goal < 70) -Not ideally controlled -Current treatment: Atorvastatin 80 mg tablet daily. Appropriate, Effective, Safe, Accessible Vascepa 1 gram- taking 1 capsule twice daily. Appropriate, Effective, Safe, Accessible -Current dietary patterns: she reports eating healthy  -Current exercise habits: will discuss further   -Educated on Cholesterol goals;  Benefits of statin for ASCVD risk reduction; -Recommended to continue current medication  Diabetes (A1c goal <8%) -Uncontrolled -Current medications: Jardiance 25 mg tablet once per day Appropriate, Effective, Safe, Accessible Pioglitazone 15 mg tablet daily Appropriate, Effective, Safe, Accessible Lantus 35 units in the morning daily Appropriate, Effective, Safe, Accessible -Current home glucose readings fasting glucose: 98-178, normally around 80's or 90's post prandial glucose: 140-187 -Denies hypoglycemic/hyperglycemic symptoms -Current meal patterns: she has never had a problem with her eating habits  drinks: drinking plenty of water  -Current exercise: will discuss further during next office visit  -Educated on A1c and blood sugar goals; Exercise goal of 150 minutes per week; Counseled to check feet daily and get yearly eye exams -Recommended to continue current medication  Patient Goals/Self-Care Activities Patient will:  - take medications as prescribed as evidenced by patient report and record review  Follow Up Plan: The patient has been provided with contact information for the care management team and has been advised to call with any health related questions or concerns.        Patient agreed to services and verbal consent obtained.   The patient verbalized understanding of instructions, educational materials, and care plan provided today and agreed to receive a mailed copy of patient instructions,  educational materials, and care plan.   [name], PharmD Clinical Pharmacist [facility] [phone]

## 2021-08-09 NOTE — Progress Notes (Signed)
Chronic Care Management Pharmacy Note  08/09/2021 Name:  Samaiyah Howes MRN:  [id number] DOB:  October 24, 1945  Summary: Patient reports that she is out of town she is taking her medication.   Recommendations/Changes made from today's visit: Recommend patient continue to check BS at least twice per day.   Plan: Ms. Hearn is going to continue to check her BS and write down readings in a log book.   Subjective: Seniah Lawrence is an 76 y.o. year old female who is a primary patient of [name], [place].  The CCM team was consulted for assistance with disease management and care coordination needs.    Engaged with patient by telephone for follow up visit in response to provider referral for pharmacy case management and/or care coordination services.   Consent to Services:  The patient was given information about Chronic Care Management services, agreed to services, and gave verbal consent prior to initiation of services.  Please see initial visit note for detailed documentation.   Patient Care Team: [name], FNP as PCP - General (General Practice) Mayford Knife, [facility] (Pharmacist)  Recent office visits: 05/17/2021 PCP OV  Recent consult visits: 07/21/2021 Endocrinology [facility] visits: 07/17/2021 - ED visit Herpetic neuralgia   Objective:  Lab Results  Component Value Date   CREATININE 0.50 07/17/2021   BUN 13 07/17/2021   GFR 85.51 12/09/2020   EGFR 92 03/25/2020   GFRNONAA >60 07/17/2021   GFRAA 102 10/20/2019   NA 138 07/17/2021   K 3.4 (L) 07/17/2021   CALCIUM 9.4 07/17/2021   CO2 25 07/17/2021   GLUCOSE 110 (H) 07/17/2021    Lab Results  Component Value Date/Time   HGBA1C 9.3 (A) 07/21/2021 09:24 AM   HGBA1C 9.5 (H) 02/09/2021 12:52 PM   HGBA1C 13.8 (H) 10/18/2020 10:21 AM   GFR 85.51 12/09/2020 08:56 AM   MICROALBUR <0.7 12/09/2020 08:56 AM   MICROALBUR 30 12/10/2018 05:09 PM   MICROALBUR 150 10/31/2017 12:47 PM    Last diabetic Eye exam:   Lab Results  Component Value Date/Time   HMDIABEYEEXA No Retinopathy 10/02/2019 12:00 AM    Last diabetic Foot exam: No results found for: "HMDIABFOOTEX"   Lab Results  Component Value Date   CHOL 149 12/09/2020   HDL 47.50 12/09/2020   LDLCALC 67 12/09/2020   TRIG 174.0 (H) 12/09/2020   CHOLHDL 3 12/09/2020       Latest Ref Rng & Units 07/17/2021    1:46 PM 05/26/2020    3:17 PM 03/25/2020   12:13 PM  Hepatic Function  Total Protein 6.5 - 8.1 g/dL 7.3  7.8  7.4   Albumin 3.5 - 5.0 g/dL 3.9  4.0  4.6   AST 15 - 41 U/L _0 ALT 0 - 44 U/L 21  21  39   Alk Phosphatase 38 - 126 U/L 100  122  161   Total Bilirubin 0.3 - 1.2 mg/dL 0.5  0.9  0.3     Lab Results  Component Value Date/Time   TSH 2.10 07/21/2021 09:55 AM   TSH 1.240 10/18/2020 10:21 AM   FREET4 0.86 07/21/2021 09:55 AM       Latest Ref Rng & Units 07/17/2021    1:46 PM 05/26/2020    3:17 PM 10/20/2019   10:22 AM  CBC  WBC 4.0 - 10.5 K/uL 10.8  10.6  12.8   Hemoglobin 12.0 - 15.0 g/dL 13.5  13.4  13.5   Hematocrit 36.0 - 46.0 % 42.0  40.5  42.2   Platelets 150 - 400 K/uL 109  120  132     No results found for: "VD25OH"  Clinical ASCVD: No  The 10-year ASCVD risk score (Arnett DK, et al., 2019) is: 38.8%   Values used to calculate the score:     Age: 45 years     Sex: Female     Is Non-Hispanic African American: Yes     Diabetic: Yes     Tobacco smoker: Yes     Systolic Blood Pressure: 696 mmHg     Is BP treated: Yes     HDL Cholesterol: 47.5 mg/dL     Total Cholesterol: 149 mg/dL       08/12/2020   10:42 AM 07/30/2019    2:16 PM 02/04/2019    2:14 PM  Depression screen PHQ 2/9  Decreased Interest 0 0 0  Down, Depressed, Hopeless 0 0 0  PHQ - 2 Score 0 0 0      Social History   Tobacco Use  Smoking Status Every Day   Packs/day: 0.25   Types: Cigarettes  Smokeless Tobacco Current   BP Readings from Last 3 Encounters:  07/21/21 120/70  07/17/21 (!) 163/86  05/17/21 108/80    Pulse Readings from Last 3 Encounters:  07/21/21 63  07/17/21 82  05/17/21 84   Wt Readings from Last 3 Encounters:  07/21/21 122 lb 12.8 oz (55.7 kg)  05/17/21 122 lb 6.4 oz (55.5 kg)  03/10/21 118 lb (53.5 kg)   BMI Readings from Last 3 Encounters:  07/21/21 21.75 kg/m  05/17/21 21.68 kg/m  03/10/21 20.90 kg/m    Assessment/Interventions: Review of patient past medical history, allergies, medications, health status, including review of consultants reports, laboratory and other test data, was performed as part of comprehensive evaluation and provision of chronic care management services.   SDOH:  (Social Determinants of Health) assessments and interventions performed: No  SDOH Screenings   Alcohol Screen: Low Risk  (04/22/2021)   Alcohol Screen    Last Alcohol Screening Score (AUDIT): 0  Depression (PHQ2-9): Low Risk  (08/12/2020)   Depression (PHQ2-9)    PHQ-2 Score: 0  Financial Resource Strain: High Risk (08/12/2020)   Overall Financial Resource Strain (CARDIA)    Difficulty of Paying Living Expenses: Hard  Food Insecurity: Food Insecurity Present (09/23/2020)   Hunger Vital Sign    Worried About Running Out of Food in the Last Year: Often true    Ran Out of Food in the Last Year: Often true  Housing: Low Risk  (07/17/2019)   Housing    Last Housing Risk Score: 0  Physical Activity: Inactive (08/12/2020)   Exercise Vital Sign    Days of Exercise per Week: 0 days    Minutes of Exercise per Session: 0 min  Social Connections: Not on file  Stress: No Stress Concern Present (08/12/2020)   [place]    Feeling of Stress : Not at all  Tobacco Use: High Risk (07/21/2021)   Patient History    Smoking Tobacco Use: Every Day    Smokeless Tobacco Use: Current    Passive Exposure: Not on file  Transportation Needs: No Transportation Needs (08/12/2020)   PRAPARE - Transportation    Lack of Transportation (Medical):  No    Lack of Transportation (Non-Medical): No    CCM Care Plan  No Known Allergies  Medications  Reviewed Today     Reviewed by Mayford Knife, RPH (Pharmacist) on 08/09/21 at 1510  Med List Status: <None>   Medication Order Taking? Sig Documenting Provider Last Dose Status Informant  amLODipine (NORVASC) 5 MG tablet [id number] Yes TAKE ONE TABLET BY MOUTH BEFORE Evelena Peat, FNP Taking Active   atorvastatin (LIPITOR) 80 MG tablet [id number] Yes TAKE ONE TABLET BY MOUTH AT BEDTIME [name], FNP Taking Active   Blood Glucose Monitoring Suppl DEVI [id number] Yes Check blood sugars twice daily. E11.9 [name], FNP Taking Active Self  dicyclomine (BENTYL) 20 MG tablet [id number] Yes Take 1 tablet (20 mg total) by mouth 2 (two) times daily. Kommor, Madison, MD Taking Active   empagliflozin (JARDIANCE) 25 MG TABS tablet [id number] Yes Take 1 tablet (25 mg total) by mouth daily before breakfast. Shamleffer, Melanie Crazier, MD Taking Active   HYDROcodone-acetaminophen (NORCO/VICODIN) 5-325 MG tablet [id number] Yes Take 2 tablets by mouth every 4 (four) hours as needed. Kommor, Madison, MD Taking Active   Injection Device for Insulin [facility] SIMPLICITY INSERTER) MISC [id number] Yes 1 Device by Does not apply route daily in the afternoon. Shamleffer, Melanie Crazier, MD Taking Active   insulin glargine (LANTUS SOLOSTAR) 100 UNIT/ML Solostar Pen [id number] Yes Inject 35 Units into the skin daily. Shamleffer, Melanie Crazier, MD Taking Active   Insulin Pen Needle 32G X 6 MM MISC [id number] Yes 1 each by Does not apply route in the morning, at noon, in the evening, and at bedtime. [name], FNP Taking Active   lidocaine (LIDODERM) 5 % [id number] Yes Place 1 patch onto the skin daily. Remove & Discard patch within 12 hours or as directed by MD Kommor, Debe Coder, MD Taking Active   losartan (COZAAR) 50 MG tablet [id number] Yes Take 1 tablet (50 mg total) by mouth daily. [name], FNP  Taking Active   magnesium 30 MG tablet [id number] Yes Take 30 mg by mouth once a week. [provider] Taking Active Self  Multiple Vitamins-Minerals (MULTIVITAMIN WITH MINERALS) tablet [id number] Yes Take 1 tablet by mouth daily. [provider] Taking Active Self  pioglitazone (ACTOS) 15 MG tablet [id number] Yes TAKE 1 TABLET(15 MG) BY MOUTH DAILY Shamleffer, Melanie Crazier, MD Taking Active   potassium chloride (KLOR-CON M) 10 MEQ tablet [id number] Yes Take 1 tablet (10 mEq total) by mouth daily for 180 doses. [name], FNP Taking Active   VASCEPA 1 g capsule [id number] Yes TAKE 2 CAPSULES(2 GRAMS) BY MOUTH TWICE DAILY Debara Pickett Nadean Corwin, MD Taking Active             Patient Active Problem List   Diagnosis Date Noted   Dyslipidemia 12/09/2020   Other insomnia 10/18/2020   Uncontrolled type 2 diabetes mellitus with hyperglycemia ([place]) 10/18/2020   Tobacco abuse 07/22/2020   Type 2 diabetes mellitus ([place]) 07/22/2020   Cognitive deficits 11/07/2018   ETOH abuse 07/30/2013   Anxiety 07/30/2013   Essential hypertension 01/31/2011    Immunization History  Administered Date(s) Administered   Fluad Quad(high Dose 65+) 10/20/2019   Janssen (J&J) SARS-COV-2 Vaccination 03/17/2019   PFIZER(Purple Top)SARS-COV-2 Vaccination 01/05/2020, 07/16/2020   Pfizer Covid-19 Vaccine Bivalent Booster 5y-11y 01/11/2021   Tdap 01/25/2013, 07/29/2013    Conditions to be addressed/monitored:  Hyperlipidemia and Diabetes  Care Plan : [place]  Updates made by Mayford Knife, RPH since 08/09/2021 12:00 AM     Problem: HTN, DM II   Priority: High     Long-Range Goal: Disease Management  Recent Progress: On track  Priority: High  Note:    Current Barriers:  Unable to independently monitor therapeutic efficacy Unable to achieve control of diabetes   Pharmacist Clinical Goal(s):  Patient will achieve adherence to monitoring guidelines and medication adherence  to achieve therapeutic efficacy through collaboration with PharmD and provider.   Interventions: 1:1 collaboration with [name], FNP regarding development and update of comprehensive plan of care as evidenced by provider attestation and co-signature Inter-disciplinary care team collaboration (see longitudinal plan of care) Comprehensive medication review performed; medication list updated in electronic medical record   Hyperlipidemia: (LDL goal < 70) -Not ideally controlled -Current treatment: Atorvastatin 80 mg tablet daily. Appropriate, Effective, Safe, Accessible Vascepa 1 gram- taking 1 capsule twice daily. Appropriate, Effective, Safe, Accessible -Current dietary patterns: she reports eating healthy  -Current exercise habits: will discuss further  -Educated on Cholesterol goals;  Benefits of statin for ASCVD risk reduction; -Recommended to continue current medication  Diabetes (A1c goal <8%) -Uncontrolled -Current medications: Jardiance 25 mg tablet once per day Appropriate, Effective, Safe, Accessible Pioglitazone 15 mg tablet daily Appropriate, Effective, Safe, Accessible Lantus 35 units in the morning daily Appropriate, Effective, Safe, Accessible -Current home glucose readings fasting glucose: 98-178, normally around 80's or 90's post prandial glucose: 140-187 -Denies hypoglycemic/hyperglycemic symptoms -Current meal patterns: she has never had a problem with her eating habits  drinks: drinking plenty of water  -Current exercise: will discuss further during next office visit  -Educated on A1c and blood sugar goals; Exercise goal of 150 minutes per week; Counseled to check feet daily and get yearly eye exams -Counseled to check feet daily and get yearly eye exams -Recommended to continue current medication  Patient Goals/Self-Care Activities Patient will:  - take medications as prescribed as evidenced by patient report and record review  Follow Up Plan: The  patient has been provided with contact information for the care management team and has been advised to call with any health related questions or concerns.       Medication Assistance: None required.  Patient affirms current coverage meets needs.  Compliance/Adherence/Medication fill history: Care Gaps: Colonoscopy - she would like to do the cologuard   Star-Rating Drugs: Atorvastatin 80 mg tablet  Jardiance 25 mg tablet Losartan 50 mg tablet  Actos 15 mg tablet   Patient's preferred pharmacy is:  [facility] [place], [address] [place] [address] Phone: [phone] Fax: [phone]  [facility] - [place], [place] - [address] [place] [address] Phone: [phone] Fax: [phone]  [facility] - [address], [place] [address] [place] [address] Phone: [phone] Fax: [phone]  Uses pill box? No - her medication is placed in vials and delivered Pt endorses 93% compliance  We discussed: Benefits of medication synchronization, packaging and delivery as well as enhanced pharmacist oversight with [facility]. Patient decided to: Continue current medication management strategy  Care Plan and Follow Up Patient Decision:  Patient agrees to Care Plan and Follow-up.  Plan: The patient has been provided with contact information for the care management team and has been advised to call with any health related questions or concerns.   [name], CPP, PharmD Clinical Pharmacist Practitioner [facility] [phone]

## 2021-09-08 DIAGNOSIS — E1165 Type 2 diabetes mellitus with hyperglycemia: Secondary | ICD-10-CM

## 2021-09-08 DIAGNOSIS — Z794 Long term (current) use of insulin: Secondary | ICD-10-CM | POA: Diagnosis not present

## 2021-09-08 DIAGNOSIS — E785 Hyperlipidemia, unspecified: Secondary | ICD-10-CM

## 2021-09-21 ENCOUNTER — Ambulatory Visit: Payer: Medicare PPO | Admitting: Nurse Practitioner

## 2021-09-21 ENCOUNTER — Ambulatory Visit: Payer: Medicare PPO

## 2021-09-27 ENCOUNTER — Telehealth: Payer: Self-pay | Admitting: Nurse Practitioner

## 2021-09-27 NOTE — Telephone Encounter (Signed)
Left message for patient to call back and schedule Medicare Annual Wellness Visit (AWV) either virtually or in office.  Left both my jabber number [phone] and office number    Last AWV ;08/12/20  [facility]

## 2021-09-28 ENCOUNTER — Encounter: Payer: Self-pay | Admitting: *Deleted

## 2021-09-28 NOTE — Progress Notes (Signed)
[facility] Quality Team Note  Name: Gabriella Acosta Date of Birth: 19-Jan-1945 MRN: [id number] Date: 09/28/2021  [facility] Quality Team has reviewed this patient's chart, please see recommendations below:  Diabetic Retinal Eye Exam; Patient requests provider to schedule Diabetic Retinal Eye exam at [facility]). Please call patient back with appointment details. Called pt and offered to schedule for eye event on 10/12/2021.  Pt declined due to working.  Requested referral to Dr. Katy Fitch.

## 2021-10-01 ENCOUNTER — Encounter: Payer: Self-pay | Admitting: Nurse Practitioner

## 2021-10-08 ENCOUNTER — Ambulatory Visit (INDEPENDENT_AMBULATORY_CARE_PROVIDER_SITE_OTHER): Payer: Medicare PPO

## 2021-10-08 DIAGNOSIS — Z0000 Encounter for general adult medical examination without abnormal findings: Secondary | ICD-10-CM | POA: Diagnosis not present

## 2021-10-08 NOTE — Patient Instructions (Signed)
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
## 2021-10-19 DIAGNOSIS — K861 Other chronic pancreatitis: Secondary | ICD-10-CM | POA: Diagnosis not present

## 2021-10-19 DIAGNOSIS — E119 Type 2 diabetes mellitus without complications: Secondary | ICD-10-CM | POA: Diagnosis not present

## 2021-10-24 ENCOUNTER — Telehealth: Payer: Self-pay

## 2021-10-24 NOTE — Chronic Care Management (AMB) (Signed)
  Gabriella Acosta was reminded to have all medications, supplements and any blood glucose and blood pressure readings available for review with [name], Pharm. D, at her telephone visit on 10-26-2021 at 3:00.   Questions: Have you had any recent office visit or specialist visit outside of [place]? Patient stated Dr. Benson Norway  Are there any concerns you would like to discuss during your office visit? Patient stated no  Are you having any problems obtaining your medications? (Whether it pharmacy issues or cost) Patient stated no  If patient has any PAP medications ask if they are having any problems getting their PAP medication or refill? No PAP meds  Care Gaps: Colonoscopy overdue Covid booster overdue Shingrix overdue Flu vaccine overdue Foot exam overdue  Star Rating Drug: Actos 15 mg- Last filled 10-13-2021 32 DS upstream Losartan 50 mg- Last filled 08-11-2021 90 DS Upstream Atorvastatin 80 mg- Last filled 08-11-2021 90 DS Upstream Jardiance 25 mg- Last filled 08-11-2021 90 DS upstream  Any gaps in medications fill history? No  [place] Pharmacist Assistant [phone]

## 2021-10-26 ENCOUNTER — Telehealth: Payer: Medicare PPO

## 2021-10-28 ENCOUNTER — Ambulatory Visit (INDEPENDENT_AMBULATORY_CARE_PROVIDER_SITE_OTHER): Payer: Medicare PPO

## 2021-10-28 DIAGNOSIS — E1165 Type 2 diabetes mellitus with hyperglycemia: Secondary | ICD-10-CM

## 2021-10-28 DIAGNOSIS — I10 Essential (primary) hypertension: Secondary | ICD-10-CM

## 2021-10-28 NOTE — Progress Notes (Signed)
Chronic Care Management Pharmacy Note  11/04/2021 Name:  Gabriella Acosta MRN:  [id number] DOB:  [birth year]  Summary: Gabriella Acosta reports that her BS are doing better.   Recommendations/Changes made from today's visit: Recommend patient get DEXA scan completed   Plan: Patient reports that she will discuss with PCP    Subjective: Gabriella Acosta is an 77 y.o. year old female who is a primary patient of [name], [place].  The CCM team was consulted for assistance with disease management and care coordination needs.    Engaged with patient by telephone for follow up visit in response to provider referral for pharmacy case management and/or care coordination services.   Consent to Services:  The patient was given the following information about Chronic Care Management services today, agreed to services, and gave verbal consent: 1. CCM service includes personalized support from designated clinical staff supervised by the primary care provider, including individualized plan of care and coordination with other care providers 2. 24/7 contact phone numbers for assistance for urgent and routine care needs. 3. Service will only be billed when office clinical staff spend 20 minutes or more in a month to coordinate care. 4. Only one practitioner may furnish and bill the service in a calendar month. 5.The patient may stop CCM services at any time (effective at the end of the month) by phone call to the office staff. 6. The patient will be responsible for cost sharing (co-pay) of up to 20% of the service fee (after annual deductible is met). Patient agreed to services and consent obtained.  Patient Care Team: [name], FNP as PCP - General (General Practice) Mayford Knife, [facility] (Pharmacist)  Recent office visits: 05/17/2021 PCP OV  Recent consult visits: 10/19/2021 Visit with Dr. Benson Norway 07/21/2021 Visit with [facility] visits: None in previous 6  months   Objective:  Lab Results  Component Value Date   CREATININE 0.50 07/17/2021   BUN 13 07/17/2021   GFR 85.51 12/09/2020   EGFR 92 03/25/2020   GFRNONAA >60 07/17/2021   GFRAA 102 10/20/2019   NA 138 07/17/2021   K 3.4 (L) 07/17/2021   CALCIUM 9.4 07/17/2021   CO2 25 07/17/2021   GLUCOSE 110 (H) 07/17/2021    Lab Results  Component Value Date/Time   HGBA1C 9.3 (A) 07/21/2021 09:24 AM   HGBA1C 9.5 (H) 02/09/2021 12:52 PM   HGBA1C 13.8 (H) 10/18/2020 10:21 AM   GFR 85.51 12/09/2020 08:56 AM   MICROALBUR <0.7 12/09/2020 08:56 AM   MICROALBUR 30 12/10/2018 05:09 PM   MICROALBUR 150 10/31/2017 12:47 PM    Last diabetic Eye exam:  Lab Results  Component Value Date/Time   HMDIABEYEEXA No Retinopathy 10/02/2019 12:00 AM    Last diabetic Foot exam: No results found for: "HMDIABFOOTEX"   Lab Results  Component Value Date   CHOL 149 12/09/2020   HDL 47.50 12/09/2020   LDLCALC 67 12/09/2020   TRIG 174.0 (H) 12/09/2020   CHOLHDL 3 12/09/2020       Latest Ref Rng & Units 07/17/2021    1:46 PM 05/26/2020    3:17 PM 03/25/2020   12:13 PM  Hepatic Function  Total Protein 6.5 - 8.1 g/dL 7.3  7.8  7.4   Albumin 3.5 - 5.0 g/dL 3.9  4.0  4.6   AST 15 - 41 U/L '24  26  30   ' ALT 0 - 44 U/L 21  21  39   Alk Phosphatase 38 - 126  U/L 100  122  161   Total Bilirubin 0.3 - 1.2 mg/dL 0.5  0.9  0.3     Lab Results  Component Value Date/Time   TSH 2.10 07/21/2021 09:55 AM   TSH 1.240 10/18/2020 10:21 AM   FREET4 0.86 07/21/2021 09:55 AM       Latest Ref Rng & Units 07/17/2021    1:46 PM 05/26/2020    3:17 PM 10/20/2019   10:22 AM  CBC  WBC 4.0 - 10.5 K/uL 10.8  10.6  12.8   Hemoglobin 12.0 - 15.0 g/dL 13.5  13.4  13.5   Hematocrit 36.0 - 46.0 % 42.0  40.5  42.2   Platelets 150 - 400 K/uL 109  120  132     No results found for: "VD25OH"  Clinical ASCVD: No  The 10-year ASCVD risk score (Arnett DK, et al., 2019) is: 38.8%   Values used to calculate the score:     Age:  23 years     Sex: Female     Is Non-Hispanic African American: Yes     Diabetic: Yes     Tobacco smoker: Yes     Systolic Blood Pressure: 468 mmHg     Is BP treated: Yes     HDL Cholesterol: 47.5 mg/dL     Total Cholesterol: 149 mg/dL       10/08/2021    3:15 PM 10/08/2021    3:14 PM 08/12/2020   10:42 AM  Depression screen PHQ 2/9  Decreased Interest 0 0 0  Down, Depressed, Hopeless 0 0 0  PHQ - 2 Score 0 0 0      Social History   Tobacco Use  Smoking Status Every Day   Packs/day: 0.25   Types: Cigarettes  Smokeless Tobacco Current   BP Readings from Last 3 Encounters:  07/21/21 120/70  07/17/21 (!) 163/86  05/17/21 108/80   Pulse Readings from Last 3 Encounters:  07/21/21 63  07/17/21 82  05/17/21 84   Wt Readings from Last 3 Encounters:  07/21/21 122 lb 12.8 oz (55.7 kg)  05/17/21 122 lb 6.4 oz (55.5 kg)  03/10/21 118 lb (53.5 kg)   BMI Readings from Last 3 Encounters:  07/21/21 21.75 kg/m  05/17/21 21.68 kg/m  03/10/21 20.90 kg/m    Assessment/Interventions: Review of patient past medical history, allergies, medications, health status, including review of consultants reports, laboratory and other test data, was performed as part of comprehensive evaluation and provision of chronic care management services.   SDOH:  (Social Determinants of Health) assessments and interventions performed: No SDOH Interventions    Flowsheet Row Chronic Care Management from 09/23/2020 in [facility] Chronic Care Management from 04/28/2020 in [facility] Chronic Care Management from 09/05/2019 in [facility] Chronic Care Management from 08/04/2019 in [facility] Chronic Care Management from 07/17/2019 in [facility] Clinical Support from 07/23/2018 in [facility]  SDOH Interventions        Food Insecurity Interventions Other (Comment), [id number]  Referral  [provided with list of local food pantries] -- -- -- Intervention Not Indicated --  Housing Interventions -- -- -- -- Intervention Not Indicated --  Transportation Interventions -- -- -- -- Intervention Not Indicated --  Depression Interventions/Treatment  -- -- -- -- -- PHQ2-9 Score <4 Follow-up Not Indicated  Financial Strain Interventions -- --  [Patient assistance application for Fiasp] Other (Comment)  [Will assist patient with patient assistance  for Fiasp if needed] Other (Comment)  [Assisted with coordination of refills for Januvia from [employer] patient assistance program] -- --      SDOH Screenings   Food Insecurity: No Food Insecurity (10/08/2021)  Housing: Low Risk  (10/08/2021)  Transportation Needs: No Transportation Needs (10/08/2021)  Utilities: Not At Risk (10/08/2021)  Alcohol Screen: Low Risk  (10/08/2021)  Depression (PHQ2-9): Low Risk  (10/08/2021)  Financial Resource Strain: Low Risk  (10/08/2021)  Physical Activity: Sufficiently Active (10/08/2021)  Social Connections: Moderately Isolated (10/08/2021)  Stress: No Stress Concern Present (10/08/2021)  Tobacco Use: High Risk (10/08/2021)    CCM Care Plan  No Known Allergies  Medications Reviewed Today     Reviewed by Mayford Knife, [place] (Pharmacist) on 10/28/21 at 1353  Med List Status: <None>   Medication Order Taking? Sig Documenting Provider Last Dose Status Informant  amLODipine (NORVASC) 5 MG tablet [id number]  TAKE ONE TABLET BY MOUTH BEFORE Evelena Peat, FNP  Active   atorvastatin (LIPITOR) 80 MG tablet [id number]  TAKE ONE TABLET BY MOUTH AT BEDTIME [name], FNP  Active   Blood Glucose Monitoring Suppl DEVI [id number] No Check blood sugars twice daily. E11.9 [name], FNP Taking Active Self  dicyclomine (BENTYL) 20 MG tablet [id number] No Take 1 tablet (20 mg total) by mouth 2 (two) times daily. Kommor, Madison, MD Taking Active   empagliflozin (JARDIANCE) 25 MG TABS tablet [id number]   Take 1 tablet (25 mg total) by mouth daily before breakfast. [name], FNP  Active   HYDROcodone-acetaminophen (NORCO/VICODIN) 5-325 MG tablet [id number] No Take 2 tablets by mouth every 4 (four) hours as needed. Kommor, Madison, MD Taking Active   Injection Device for Insulin [facility] SIMPLICITY INSERTER) MISC [id number] No 1 Device by Does not apply route daily in the afternoon. Shamleffer, Melanie Crazier, MD Taking Active   insulin glargine (LANTUS SOLOSTAR) 100 UNIT/ML Solostar Pen [id number] No Inject 35 Units into the skin daily. Shamleffer, Melanie Crazier, MD Taking Active   Insulin Pen Needle 32G X 6 MM MISC [id number] No 1 each by Does not apply route in the morning, at noon, in the evening, and at bedtime. [name], FNP Taking Active   lidocaine (LIDODERM) 5 % [id number] No Place 1 patch onto the skin daily. Remove & Discard patch within 12 hours or as directed by MD Kommor, Debe Coder, MD Taking Active   losartan (COZAAR) 50 MG tablet [id number]  Take 1 tablet (50 mg total) by mouth daily. [name], FNP  Active   magnesium 30 MG tablet [id number] No Take 30 mg by mouth once a week. [provider] Taking Active Self  Multiple Vitamins-Minerals (MULTIVITAMIN WITH MINERALS) tablet [id number] No Take 1 tablet by mouth daily. [provider] Taking Active Self  pioglitazone (ACTOS) 15 MG tablet [id number] No TAKE 1 TABLET(15 MG) BY MOUTH DAILY Shamleffer, Melanie Crazier, MD Taking Active   potassium chloride (KLOR-CON M) 10 MEQ tablet [id number]  Take 1 tablet (10 mEq total) by mouth daily for 180 doses. [name], FNP  Active   VASCEPA 1 g capsule [id number] No TAKE 2 CAPSULES(2 GRAMS) BY MOUTH TWICE DAILY Debara Pickett Nadean Corwin, MD Taking Active             Patient Active Problem List   Diagnosis Date Noted   Dyslipidemia 12/09/2020   Other insomnia 10/18/2020   Uncontrolled type 2 diabetes mellitus with hyperglycemia ([place]) 10/18/2020   Tobacco abuse 07/22/2020    Type 2 diabetes mellitus ([place]) 07/22/2020  Cognitive deficits 11/07/2018   ETOH abuse 07/30/2013   Anxiety 07/30/2013   Essential hypertension 01/31/2011    Immunization History  Administered Date(s) Administered   Fluad Quad(high Dose 65+) 10/20/2019   Alphonsa Overall (J&J) SARS-COV-2 Vaccination 03/17/2019   PFIZER(Purple Top)SARS-COV-2 Vaccination 01/05/2020, 07/16/2020   Pfizer Covid-19 Vaccine Bivalent Booster 5y-11y 01/11/2021   Tdap 01/25/2013, 07/29/2013    Conditions to be addressed/monitored:  Hypertension and Diabetes  Care Plan : [place]  Updates made by Mayford Knife, RPH since 11/04/2021 12:00 AM     Problem: HTN, DM II   Priority: High     Long-Range Goal: Disease Management   Recent Progress: On track  Priority: High  Note:   Current Barriers:  Unable to independently monitor therapeutic efficacy  Pharmacist Clinical Goal(s):  Patient will achieve adherence to monitoring guidelines and medication adherence to achieve therapeutic efficacy through collaboration with PharmD and provider.   Interventions: 1:1 collaboration with [name], FNP regarding development and update of comprehensive plan of care as evidenced by provider attestation and co-signature Inter-disciplinary care team collaboration (see longitudinal plan of care) Comprehensive medication review performed; medication list updated in electronic medical record  Hypertension (BP goal <130/80) -Controlled -Current treatment: Losartan 50 mg tablet taking 1 tablet by mouth daily Appropriate, Effective, Safe, Accessible Amlodipine 5 mg tablet once per day Appropriate, Effective, Safe, Accessible -Denies hypotensive/hypertensive symptoms -Educated on Daily salt intake goal < 2300 mg; Importance of home blood pressure monitoring; Proper BP monitoring technique; -Counseled to monitor BP at home at least once per month, document, and provide log at future appointments -Recommended  to continue current medication  Diabetes (A1c goal <7%) -Uncontrolled -Current medications: Actos 15 mg tablet once per day Appropriate, Effective, Safe, Accessible Jardiance 25 mg tablet once per Appropriate, Effective, Safe, Accessible Lantus 100 unit/ml - inject 35 units daily Appropriate, Effective, Safe, Accessible Jardiance 25 mg tablet once per Appropriate, Effective, Safe, Accessible -Current home glucose readings fasting glucose: 128, not daily but sometimes when she thinks about it during the day  -Denies hypoglycemic/hyperglycemic symptoms -Current meal patterns:  Hero, salad, sausage biscuit  -Educated on A1c and blood sugar goals; Complications of diabetes including kidney damage, retinal damage, and cardiovascular disease; -Congratulate patient on taking her medication on a consistent basis -Counseled to check feet daily and get yearly eye exams -Recommended to continue current medication  Health Maintenance -Vaccine gaps: COVID-19 Booster, Shingles, Influenza -Educated on the importance of vaccinations   Patient Goals/Self-Care Activities Patient will:  - take medications as prescribed as evidenced by patient report and record review  Follow Up Plan: The patient has been provided with contact information for the care management team and has been advised to call with any health related questions or concerns.       Medication Assistance: None required.  Patient affirms current coverage meets needs.  Compliance/Adherence/Medication fill history: Care Gaps: DEXA scan COVID-19 Vaccine Booster Shingrix Vaccine Colonoscopy  Foot Exam   Star-Rating Drugs: Atorvastatin 80 mg tablet Jardiance 25 mg tablet   Losartan 50 mg tablet  Pioglitazone 50 mg tablet   Patient's preferred pharmacy is:  [facility] [place], [place] [address] Phone: [phone]  Fax: [phone]  [facility] - [place], [place] - [address] [place] [address] Phone: [phone] Fax: [phone]  [facility] - [place], [address] [place] [address] Phone: [phone] Fax: [phone]  Uses pill box? No - patient keeps medication in vials Pt endorses 85% compliance  We discussed: Benefits of medication synchronization, packaging and delivery as well as enhanced pharmacist oversight with [facility]. Patient decided to: Continue current medication management strategy  Care Plan and Follow Up Patient Decision:  Patient agrees to Care Plan and Follow-up.  Plan: The patient has been provided with contact information for the care management team and has been advised to call with any health related questions or concerns.   [name], CPP, PharmD Clinical Pharmacist Practitioner [facility] [phone]

## 2021-11-01 ENCOUNTER — Telehealth: Payer: Self-pay

## 2021-11-01 NOTE — Chronic Care Management (AMB) (Signed)
Chronic Care Management Pharmacy Assistant   Name: Gabriella Acosta  MRN: [id number] DOB: 1945-01-24  Reason for Encounter: Medication Review/ Medication coordination  Recent office visits:  None  Recent consult visits:  None  Hospital visits:  Medication Reconciliation was completed by comparing discharge summary, patient's EMR and Pharmacy list, and upon discussion with patient.   Admitted to the hospital on 07-17-2021 due to abdominal pain. Discharge date was 07-17-2021. Discharged from [facility]?Medications Started at [facility] Discharge:?? Cefadroxil 500 mg twice daily dicyclomine 20 mg twice daily HYDROcodone-acetaminophen 5-325 mg 2 tablets every 4 hours PRN lidocaine 5% Place 1 patch onto the skin daily. Remove & Discard patch within 12 hours  valACYclovir 1,000 mg 3 times daily   Medication Changes at Hospital Discharge: None   Medications Discontinued at Hospital Discharge: None   Medications that remain the same after Hospital Discharge:??  -All other medications will remain the same.    Medications: Outpatient Encounter Medications as of 11/01/2021  Medication Sig   amLODipine (NORVASC) 5 MG tablet TAKE ONE TABLET BY MOUTH BEFORE BREAKFAST   atorvastatin (LIPITOR) 80 MG tablet TAKE ONE TABLET BY MOUTH AT BEDTIME   Blood Glucose Monitoring Suppl DEVI Check blood sugars twice daily. E11.9   dicyclomine (BENTYL) 20 MG tablet Take 1 tablet (20 mg total) by mouth 2 (two) times daily.   empagliflozin (JARDIANCE) 25 MG TABS tablet Take 1 tablet (25 mg total) by mouth daily before breakfast.   HYDROcodone-acetaminophen (NORCO/VICODIN) 5-325 MG tablet Take 2 tablets by mouth every 4 (four) hours as needed.   Injection Device for Insulin (CEQUR SIMPLICITY INSERTER) MISC 1 Device by Does not apply route daily in the afternoon.   insulin glargine (LANTUS SOLOSTAR) 100 UNIT/ML Solostar Pen Inject 35 Units into the skin daily.   Insulin Pen Needle  32G X 6 MM MISC 1 each by Does not apply route in the morning, at noon, in the evening, and at bedtime.   lidocaine (LIDODERM) 5 % Place 1 patch onto the skin daily. Remove & Discard patch within 12 hours or as directed by MD   losartan (COZAAR) 50 MG tablet Take 1 tablet (50 mg total) by mouth daily.   magnesium 30 MG tablet Take 30 mg by mouth once a week.   Multiple Vitamins-Minerals (MULTIVITAMIN WITH MINERALS) tablet Take 1 tablet by mouth daily.   pioglitazone (ACTOS) 15 MG tablet TAKE 1 TABLET(15 MG) BY MOUTH DAILY   potassium chloride (KLOR-CON M) 10 MEQ tablet Take 1 tablet (10 mEq total) by mouth daily for 180 doses.   VASCEPA 1 g capsule TAKE 2 CAPSULES(2 GRAMS) BY MOUTH TWICE DAILY   No facility-administered encounter medications on file as of 11/01/2021.  Reviewed chart for medication changes ahead of medication coordination call.  No OVs, Consults, or hospital visits since last care coordination call/Pharmacist visit.   No medication changes indicated   BP Readings from Last 3 Encounters:  07/21/21 120/70  07/17/21 (!) 163/86  05/17/21 108/80    Lab Results  Component Value Date   HGBA1C 9.3 (A) 07/21/2021     Patient obtains medications through Adherence Packaging  90 Days   Last adherence delivery included:  Losartan 50 mg daily at breakfast Atorvastatin 80 mg daily before breakfast Potassium Chloride 10 mg daily before breakfast Jardiance 25 mg daily at breakfast Amlodipine 5 mg daily before breakfast  Patient declined (meds) last delivery: Unable to reach patient  Patient is due  for next adherence delivery on: 11-032023  Called patient and reviewed medications and coordinated delivery.  This delivery to include: Potassium Chloride 10 mg daily before breakfast Jardiance 25 mg daily at breakfast Amlodipine 5 mg daily before breakfast Losartan 50 mg daily at breakfast Atorvastatin 80 mg daily before breakfast Actos 15 mg at breakfast- New med unable to  confirm time.   Patient declined the following medications: Unable to reach patient  Patient needs refills for: None  Unable to Confirmed delivery date of 11-11-2021 pharmacy will contact them the morning of delivery.  11-01-2021: 1st attempt left VM 11-02-2021: 2nd attempt left VM 11-03-2021: 3rd attempt unable to LVM  Care Gaps: Colonoscopy overdue Covid booster overdue Shingrix overdue Flu vaccine overdue Yearly foot exam overdue Dexa scan overdue Yearly ophthalmology overdue  Star Rating Drugs: Actos 15 mg- Last filled 10-13-2021 [facility] Losartan 50 mg- Last filled 08-11-2021 [facility] Atorvastatin 80 mg- Last filled 08-11-2021 [facility] Jardiance 25 mg- Last filled 08-11-2021 [facility]  [place] Clinical Pharmacist Assistant [phone]

## 2021-11-04 NOTE — Patient Instructions (Signed)
Visit Information It was great speaking with you today!  Please let me know if you have any questions about our visit.   Goals Addressed             This Visit's Progress    Manage My Medicine       Timeframe:  Long-Range Goal Priority:  High Start Date: 05/05/2020                            Expected End Date: 10/2021                     Follow Up Date: 12/14/2021  In Progress:  - call for medicine refill 2 or 3 days before it runs out - keep a list of all the medicines I take; vitamins and herbals too - use a pillbox to sort medicine - use an alarm clock or phone to remind me to take my medicine    Why is this important?   These steps will help you keep on track with your medicines.          Patient Care Plan: CCM Pharmacy Care Plan     Problem Identified: HTN, DM II   Priority: High     Long-Range Goal: Disease Management   Recent Progress: On track  Priority: High  Note:   Current Barriers:  Unable to independently monitor therapeutic efficacy  Pharmacist Clinical Goal(s):  Patient will achieve adherence to monitoring guidelines and medication adherence to achieve therapeutic efficacy through collaboration with PharmD and provider.   Interventions: 1:1 collaboration with [name], FNP regarding development and update of comprehensive plan of care as evidenced by provider attestation and co-signature Inter-disciplinary care team collaboration (see longitudinal plan of care) Comprehensive medication review performed; medication list updated in electronic medical record  Hypertension (BP goal <130/80) -Controlled -Current treatment: Losartan 50 mg tablet taking 1 tablet by mouth daily Appropriate, Effective, Safe, Accessible Amlodipine 5 mg tablet once per day Appropriate, Effective, Safe, Accessible -Denies hypotensive/hypertensive symptoms -Educated on Daily salt intake goal < 2300 mg; Importance of home blood pressure monitoring; Proper BP  monitoring technique; -Counseled to monitor BP at home at least once per month, document, and provide log at future appointments -Recommended to continue current medication  Diabetes (A1c goal <7%) -Uncontrolled -Current medications: Actos 15 mg tablet once per day Appropriate, Effective, Safe, Accessible Jardiance 25 mg tablet once per Appropriate, Effective, Safe, Accessible Lantus 100 unit/ml - inject 35 units daily Appropriate, Effective, Safe, Accessible Jardiance 25 mg tablet once per Appropriate, Effective, Safe, Accessible -Current home glucose readings fasting glucose: 128, not daily but sometimes when she thinks about it during the day  -Denies hypoglycemic/hyperglycemic symptoms -Current meal patterns:  Hero, salad, sausage biscuit  -Educated on A1c and blood sugar goals; Complications of diabetes including kidney damage, retinal damage, and cardiovascular disease; -Congratulate patient on taking her medication on a consistent basis -Counseled to check feet daily and get yearly eye exams -Recommended to continue current medication  Health Maintenance -Vaccine gaps: COVID-19 Booster, Shingles, Influenza -Educated on the importance of vaccinations   Patient Goals/Self-Care Activities Patient will:  - take medications as prescribed as evidenced by patient report and record review  Follow Up Plan: The patient has been provided with contact information for the care management team and has been advised to call with any health related questions or concerns.         Patient agreed  to services and verbal consent obtained.   The patient verbalized understanding of instructions, educational materials, and care plan provided today and agreed to receive a mailed copy of patient instructions, educational materials, and care plan.   [name], PharmD Clinical Pharmacist [facility] [phone]

## 2021-11-07 ENCOUNTER — Encounter: Payer: Self-pay | Admitting: Nurse Practitioner

## 2021-11-07 ENCOUNTER — Ambulatory Visit: Payer: Medicare PPO | Admitting: Nurse Practitioner

## 2021-11-07 VITALS — BP 118/74 | HR 75 | Temp 98.3°F | Ht 63.0 in | Wt 116.0 lb

## 2021-11-07 DIAGNOSIS — E782 Mixed hyperlipidemia: Secondary | ICD-10-CM | POA: Diagnosis not present

## 2021-11-07 DIAGNOSIS — Z1211 Encounter for screening for malignant neoplasm of colon: Secondary | ICD-10-CM

## 2021-11-07 DIAGNOSIS — R634 Abnormal weight loss: Secondary | ICD-10-CM

## 2021-11-07 DIAGNOSIS — E1165 Type 2 diabetes mellitus with hyperglycemia: Secondary | ICD-10-CM

## 2021-11-07 DIAGNOSIS — I10 Essential (primary) hypertension: Secondary | ICD-10-CM | POA: Diagnosis not present

## 2021-11-07 NOTE — Patient Instructions (Signed)

## 2021-11-07 NOTE — Progress Notes (Signed)
I,Tianna Badgett,acting as a Education administrator for [employer], FNP.,have documented all relevant documentation on the behalf of [name], FNP,as directed by  [name], FNP while in the presence of [name], [place].  Subjective:     Patient ID: Gabriella Acosta , female    DOB: 10-26-1945 , 76 y.o.   MRN: [id number]   Chief Complaint  Patient presents with   Diabetes    HPI  Pt here today for bp & dm check (she is being followed by Dr. Charlett Lango).    Wt Readings from Last 3 Encounters: 11/07/21 : 116 lb (52.6 kg) 07/21/21 : 122 lb 12.8 oz (55.7 kg) 05/17/21 : 122 lb 6.4 oz (55.5 kg)  Weight was 126 lbs in February.   Diabetes She presents for her follow-up diabetic visit. She has type 2 diabetes mellitus. Her disease course has been worsening. Pertinent negatives for diabetes include no polydipsia, no polyphagia and no polyuria. There are no hypoglycemic complications. There are no diabetic complications. Risk factors for coronary artery disease include sedentary lifestyle. She is following a generally unhealthy diet. When asked about meal planning, she reported none. She has not had a previous visit with a dietitian. She never participates in exercise. (Blood sugars have been as low as 70's) An ACE inhibitor/angiotensin II receptor blocker is being taken. She does not see a podiatrist.Eye exam is not current (she has not had a diabetic eye exam).  Hypertension This is a chronic problem. The current episode started more than 1 year ago. The problem has been gradually improving since onset. Condition status: better controlled. Risk factors for coronary artery disease include sedentary lifestyle, dyslipidemia and diabetes mellitus. Past treatments include angiotensin blockers. There is no history of angina or kidney disease.  Hyperlipidemia This is a chronic problem. The problem is uncontrolled. Recent lipid tests were reviewed and are high. Current antihyperlipidemic treatment includes  nicotinic acid. There are no compliance problems.  Risk factors for coronary artery disease include dyslipidemia, obesity, hypertension, a sedentary lifestyle and diabetes mellitus.     Past Medical History:  Diagnosis Date   Diabetes mellitus without complication ([place])    Hypertension    Type 2 diabetes mellitus ([place]) 07/22/2020     Family History  Problem Relation Age of Onset   Diabetes Mother    Diabetes Father    Heart disease Father    Breast cancer Neg Hx      Current Outpatient Medications:    amLODipine (NORVASC) 5 MG tablet, TAKE ONE TABLET BY MOUTH BEFORE BREAKFAST, Disp: 90 tablet, Rfl: 2   atorvastatin (LIPITOR) 80 MG tablet, TAKE ONE TABLET BY MOUTH AT BEDTIME, Disp: 90 tablet, Rfl: 1   Blood Glucose Monitoring Suppl DEVI, Check blood sugars twice daily. E11.9, Disp: 1 kit, Rfl: 0   dicyclomine (BENTYL) 20 MG tablet, Take 1 tablet (20 mg total) by mouth 2 (two) times daily., Disp: 20 tablet, Rfl: 0   empagliflozin (JARDIANCE) 25 MG TABS tablet, Take 1 tablet (25 mg total) by mouth daily before breakfast., Disp: 90 tablet, Rfl: 3   HYDROcodone-acetaminophen (NORCO/VICODIN) 5-325 MG tablet, Take 2 tablets by mouth every 4 (four) hours as needed., Disp: 10 tablet, Rfl: 0   insulin glargine (LANTUS SOLOSTAR) 100 UNIT/ML Solostar Pen, Inject 35 Units into the skin daily., Disp: 30 mL, Rfl: 6   Insulin Pen Needle 32G X 6 MM MISC, 1 each by Does not apply route in the morning, at noon, in the evening, and at bedtime., Disp:  300 each, Rfl: 3   lidocaine (LIDODERM) 5 %, Place 1 patch onto the skin daily. Remove & Discard patch within 12 hours or as directed by MD, Disp: 30 patch, Rfl: 0   losartan (COZAAR) 50 MG tablet, Take 1 tablet (50 mg total) by mouth daily., Disp: 90 tablet, Rfl: 1   magnesium 30 MG tablet, Take 30 mg by mouth once a week., Disp: , Rfl:    Multiple Vitamins-Minerals (MULTIVITAMIN WITH MINERALS) tablet, Take 1 tablet by mouth daily., Disp: , Rfl:     pioglitazone (ACTOS) 15 MG tablet, TAKE 1 TABLET(15 MG) BY MOUTH DAILY, Disp: 90 tablet, Rfl: 1   potassium chloride (KLOR-CON M) 10 MEQ tablet, Take 1 tablet (10 mEq total) by mouth daily for 180 doses., Disp: 90 tablet, Rfl: 1   VASCEPA 1 g capsule, TAKE 2 CAPSULES(2 GRAMS) BY MOUTH TWICE DAILY, Disp: 120 capsule, Rfl: 3   Injection Device for Insulin (CEQUR SIMPLICITY INSERTER) MISC, 1 Device by Does not apply route daily in the afternoon., Disp: 1 each, Rfl: 0   No Known Allergies   Review of Systems  Constitutional: Negative.   Respiratory: Negative.    Cardiovascular: Negative.   Gastrointestinal: Negative.   Endocrine: Negative for polydipsia, polyphagia and polyuria.  Neurological: Negative.      Today's Vitals   11/07/21 1047  BP: 118/74  Pulse: 75  Temp: 98.3 F (36.8 C)  TempSrc: Oral  Weight: 116 lb (52.6 kg)  Height: _0  (1.6 m)   Body mass index is 20.55 kg/m.   Objective:  Physical Exam Vitals reviewed.  Constitutional:      General: She is not in acute distress.    Appearance: Normal appearance. She is well-developed and normal weight.  Cardiovascular:     Rate and Rhythm: Normal rate and regular rhythm.     Pulses: Normal pulses.     Heart sounds: Normal heart sounds. No murmur heard. Pulmonary:     Effort: Pulmonary effort is normal. No respiratory distress.     Breath sounds: Normal breath sounds. No wheezing.  Skin:    General: Skin is warm and dry.  Neurological:     General: No focal deficit present.     Mental Status: She is alert and oriented to person, place, and time.     Cranial Nerves: No cranial nerve deficit.     Motor: No weakness.  Psychiatric:        Mood and Affect: Mood normal. Mood is not anxious.        Behavior: Behavior normal. Behavior is not agitated.        Thought Content: Thought content normal.        Judgment: Judgment normal.         Assessment And Plan:     1. Uncontrolled type 2 diabetes mellitus with  hyperglycemia (HCC) Comments: Continue f/u with Dr. Charlett Lango and continue current medications - BMP8+eGFR  2. Essential hypertension Comments: Blood pressure is well controlled. Continue current medications. - BMP8+eGFR  3. Mixed hyperlipidemia Comments: Continue statin, tolerating well. - Lipid panel  4. Colon cancer screening Comments: She has not had a colonoscopy at all. Will order cologuard and have discussed risk of colon cancer after the age of 76 y/o - Cologuard  5. Weight loss Comments: Weight down 10 lbs since Feburary was 126 lbs, this is not quite 10% in the last 6 months     Patient was given opportunity to ask questions. Patient verbalized understanding  of the plan and was able to repeat key elements of the plan. All questions were answered to their satisfaction.  [name], FNP   I, [name], FNP, have reviewed all documentation for this visit. The documentation on 11/07/21 for the exam, diagnosis, procedures, and orders are all accurate and complete.   IF YOU HAVE BEEN REFERRED TO A SPECIALIST, IT MAY TAKE 1-2 WEEKS TO SCHEDULE/PROCESS THE REFERRAL. IF YOU HAVE NOT HEARD FROM US/SPECIALIST IN TWO WEEKS, PLEASE GIVE [place] A CALL AT [phone].   THE PATIENT IS ENCOURAGED TO PRACTICE SOCIAL DISTANCING DUE TO THE COVID-19 PANDEMIC.

## 2021-11-08 DIAGNOSIS — Z794 Long term (current) use of insulin: Secondary | ICD-10-CM | POA: Diagnosis not present

## 2021-11-08 DIAGNOSIS — I10 Essential (primary) hypertension: Secondary | ICD-10-CM | POA: Diagnosis not present

## 2021-11-08 DIAGNOSIS — E1159 Type 2 diabetes mellitus with other circulatory complications: Secondary | ICD-10-CM | POA: Diagnosis not present

## 2021-11-08 LAB — BMP8+EGFR
BUN/Creatinine Ratio: 12 (ref 12–28)
BUN: 8 mg/dL (ref 8–27)
CO2: 21 mmol/L (ref 20–29)
Calcium: 10.3 mg/dL (ref 8.7–10.3)
Chloride: 100 mmol/L (ref 96–106)
Creatinine, Ser: 0.68 mg/dL (ref 0.57–1.00)
Glucose: 155 mg/dL — ABNORMAL HIGH (ref 70–99)
Potassium: 4.7 mmol/L (ref 3.5–5.2)
Sodium: 139 mmol/L (ref 134–144)
eGFR: 91 mL/min/{1.73_m2} (ref >59)

## 2021-11-08 LAB — LIPID PANEL
Chol/HDL Ratio: 2.9 ratio (ref 0.0–4.4)
Cholesterol, Total: 176 mg/dL (ref 100–199)
HDL: 60 mg/dL (ref >39)
LDL Chol Calc (NIH): 90 mg/dL (ref 0–99)
Triglycerides: 154 mg/dL — ABNORMAL HIGH (ref 0–149)
VLDL Cholesterol Cal: 26 mg/dL (ref 5–40)

## 2021-11-09 ENCOUNTER — Other Ambulatory Visit: Payer: Self-pay

## 2021-11-09 MED ORDER — CEQUR SIMPLICITY INSERTER MISC: 1.0000 | Freq: Every day | 0 refills | Status: DC

## 2021-12-12 ENCOUNTER — Telehealth: Payer: Self-pay

## 2021-12-12 NOTE — Chronic Care Management (AMB) (Signed)
    Called [facility] Dunker, No answer, left message of appointment on 12-14-2021 at 3:00 via telephone visit with [name], Pharm D. Notified to have all medications, supplements, blood pressure and/or blood sugar logs available during appointment and to return call if need to reschedule.   Care Gaps: Colonoscopy overdue Shingrix overdue  Uacr overdue Dexa scan overdue Yearly ophthalmology overdue  Star Rating Drug: Actos 15 mg- Last filled 11-09-2021 90 DS upstream Losartan 50 mg- Last filled 11-08-2021 90 [facility] Atorvastatin 80 mg- Last filled 11-08-2021 [facility] Jardiance 25 mg- Last filled 11-08-2021 90 [facility]   Any gaps in medications fill history? No  Huey Romans [facility] Clinical Pharmacist Assistant [phone]

## 2021-12-14 ENCOUNTER — Telehealth: Payer: Self-pay

## 2021-12-14 NOTE — Telephone Encounter (Signed)
  Care Management   Follow Up Note   12/14/2021 Name: Gabriella Acosta MRN: [id number] DOB: [birth year]   Referred by: [name], FNP Reason for referral : No chief complaint on file.   An unsuccessful telephone outreach was attempted today. The patient was referred to the case management team for assistance with care management and care coordination.   Follow Up Plan: The patient has been provided with contact information for the care management team and has been advised to call with any health related questions or concerns.   [name], CPP, PharmD Clinical Pharmacist Practitioner [facility] [phone]

## 2022-01-23 ENCOUNTER — Ambulatory Visit: Payer: Medicare PPO | Admitting: Internal Medicine

## 2022-01-27 ENCOUNTER — Other Ambulatory Visit: Payer: Self-pay | Admitting: Nurse Practitioner

## 2022-01-30 ENCOUNTER — Telehealth: Payer: Self-pay

## 2022-01-30 NOTE — Progress Notes (Signed)
Care Management & Coordination Services Pharmacy Team  Reason for Encounter: Medication coordination and delivery  Contacted patient on 01/30/2022 to discuss medications   Recent office visits:  11-07-2021 Gabriella Acosta, [place]. Glucose 155. HDL= 154  Recent consult visits:  None  Hospital visits:  None in previous 6 months  Medications: Outpatient Encounter Medications as of 01/30/2022  Medication Sig   amLODipine (NORVASC) 5 MG tablet TAKE ONE TABLET BY MOUTH BEFORE BREAKFAST   atorvastatin (LIPITOR) 80 MG tablet TAKE ONE TABLET BY MOUTH AT BEDTIME   Blood Glucose Monitoring Suppl DEVI Check blood sugars twice daily. E11.9   dicyclomine (BENTYL) 20 MG tablet Take 1 tablet (20 mg total) by mouth 2 (two) times daily.   empagliflozin (JARDIANCE) 25 MG TABS tablet Take 1 tablet (25 mg total) by mouth daily before breakfast.   HYDROcodone-acetaminophen (NORCO/VICODIN) 5-325 MG tablet Take 2 tablets by mouth every 4 (four) hours as needed.   Injection Device for Insulin (CEQUR SIMPLICITY INSERTER) MISC 1 Device by Does not apply route daily in the afternoon.   insulin glargine (LANTUS SOLOSTAR) 100 UNIT/ML Solostar Pen Inject 35 Units into the skin daily.   Insulin Pen Needle 32G X 6 MM MISC 1 each by Does not apply route in the morning, at noon, in the evening, and at bedtime.   lidocaine (LIDODERM) 5 % Place 1 patch onto the skin daily. Remove & Discard patch within 12 hours or as directed by MD   losartan (COZAAR) 50 MG tablet Take 1 tablet (50 mg total) by mouth daily.   magnesium 30 MG tablet Take 30 mg by mouth once a week.   Multiple Vitamins-Minerals (MULTIVITAMIN WITH MINERALS) tablet Take 1 tablet by mouth daily.   pioglitazone (ACTOS) 15 MG tablet TAKE 1 TABLET(15 MG) BY MOUTH DAILY   potassium chloride (KLOR-CON M) 10 MEQ tablet TAKE ONE TABLET BY MOUTH EVERY MORNING   VASCEPA 1 g capsule TAKE 2 CAPSULES(2 GRAMS) BY MOUTH TWICE DAILY   No facility-administered encounter  medications on file as of 01/30/2022.   BP Readings from Last 3 Encounters:  11/07/21 118/74  07/21/21 120/70  07/17/21 (!) 163/86    Pulse Readings from Last 3 Encounters:  11/07/21 75  07/21/21 63  07/17/21 82    Lab Results  Component Value Date/Time   HGBA1C 9.3 (A) 07/21/2021 09:24 AM   HGBA1C 9.5 (H) 02/09/2021 12:52 PM   HGBA1C 13.8 (H) 10/18/2020 10:21 AM   Lab Results  Component Value Date   CREATININE 0.68 11/07/2021   BUN 8 11/07/2021   GFR 85.51 12/09/2020   GFRNONAA >60 07/17/2021   GFRAA 102 10/20/2019   NA 139 11/07/2021   K 4.7 11/07/2021   CALCIUM 10.3 11/07/2021   CO2 21 11/07/2021     Last adherence delivery date: 11-11-2021   Patient is due for next adherence delivery on: 02-09-2022  Multiple attempts made to reach patient. Unsuccessful outreach. Will refill based off of last adherence fill.   This delivery to include: Adherence Packaging  90 Days  Losartan 50 mg daily at breakfast Atorvastatin 80 mg daily before breakfast Potassium Chloride 10 mg daily before breakfast Jardiance 25 mg daily at breakfast Amlodipine 5 mg daily before breakfast Pioglitazone 15 mg at breakfast  Patient declined the following medications this month: Unable to reach patient  No refill request needed.  Delivery scheduled for 02-09-2022. Unable to speak with patient to confirm date.    01-30-2022: 1st attempt left VM 01-31-2022: 2nd attempt left  VM 02-01-2022: 3rd attempt left VM   Recent blood pressure readings are as follows: 118/74 11-07-2021  Recent blood glucose readings are as follows: 9.5 02-09-2021   Cycle dispensing form sent to Gabriella Acosta for review.   Gabriella Acosta
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
## 2022-02-01 ENCOUNTER — Ambulatory Visit (INDEPENDENT_AMBULATORY_CARE_PROVIDER_SITE_OTHER): Payer: Medicare PPO | Admitting: Internal Medicine

## 2022-02-01 ENCOUNTER — Encounter: Payer: Self-pay | Admitting: Internal Medicine

## 2022-02-01 VITALS — BP 120/70 | HR 83 | Ht 63.0 in | Wt 119.0 lb

## 2022-02-01 DIAGNOSIS — E1165 Type 2 diabetes mellitus with hyperglycemia: Secondary | ICD-10-CM | POA: Diagnosis not present

## 2022-02-01 LAB — POCT GLYCOSYLATED HEMOGLOBIN (HGB A1C): Hemoglobin A1C: 9.3 % — AB (ref 4.0–5.6)

## 2022-02-01 LAB — POCT GLUCOSE (DEVICE FOR HOME USE): Glucose Fasting, POC: 226 mg/dL — AB (ref 70–99)

## 2022-02-01 MED ORDER — PIOGLITAZONE HCL 30 MG PO TABS: 30.0000 mg | ORAL_TABLET | Freq: Every day | ORAL | 3 refills | Status: DC

## 2022-02-01 MED ORDER — LANTUS SOLOSTAR 100 UNIT/ML ~~LOC~~ SOPN: 40.0000 [IU] | PEN_INJECTOR | Freq: Every day | SUBCUTANEOUS | 3 refills | Status: DC

## 2022-02-01 MED ORDER — INSULIN PEN NEEDLE 32G X 6 MM MISC: 1.0000 | Freq: Every day | 3 refills | Status: DC

## 2022-02-01 NOTE — Patient Instructions (Signed)
-   Increase Lantus 40 units daily  - Continue  Jardiance 25 mg daily  - Increase Pioglitazone 30 mg daily           HOW TO TREAT LOW BLOOD SUGARS (Blood sugar LESS THAN 70 MG/DL) Please follow the RULE OF 15 for the treatment of hypoglycemia treatment (when your (blood sugars are less than 70 mg/dL)   STEP 1: Take 15 grams of carbohydrates when your blood sugar is low, which includes:  3-4 GLUCOSE TABS  OR 3-4 OZ OF JUICE OR REGULAR SODA OR ONE TUBE OF GLUCOSE GEL    STEP 2: RECHECK blood sugar in 15 MINUTES STEP 3: If your blood sugar is still low at the 15 minute recheck --> then, go back to STEP 1 and treat AGAIN with another 15 grams of carbohydrates.

## 2022-02-02 MED ORDER — EMPAGLIFLOZIN 25 MG PO TABS: 25.0000 mg | ORAL_TABLET | Freq: Every day | ORAL | 3 refills | Status: DC

## 2022-02-23 ENCOUNTER — Telehealth: Payer: Self-pay

## 2022-02-23 NOTE — Telephone Encounter (Signed)
Patient states that since the increase of Lantus her sugars have dropped 2 separate times at night . Patient was at work and didn't have the numbers.

## 2022-03-01 ENCOUNTER — Telehealth: Payer: Self-pay

## 2022-03-01 NOTE — Progress Notes (Signed)
Care Management & Coordination Services Pharmacy Team  Reason for Encounter: Medication coordination and delivery  Contacted patient to discuss medications and coordinate delivery from [facility]. Unsuccessful outreach. Left voicemail for patient to return call. Cycle dispensing form sent to [name] for review.   Last adherence delivery date: 02-09-2022  Patient is due for next adherence delivery on: 03-13-2022  This delivery to include: Adherence Packaging  30 Days  Losartan 50 mg daily at breakfast Atorvastatin 80 mg daily before breakfast Potassium Chloride 10 mg daily before breakfast Jardiance 25 mg daily at breakfast Amlodipine 5 mg daily before breakfast Pioglitazone 15 mg at breakfast  Patient declined the following medications this month: Unable to reach patient  No refill request needed.  Delivery scheduled for 03-13-2022. Unable to speak with patient to confirm date.    Chart review: Recent office visits:  11-07-2021 [name], [place]. Glucose 155. HDL= 154   Recent consult visits:  02-01-2022 Shamleffer, Melanie Crazier, MD (Endo). Glucose fasting POC= 226, POCT A1C= 9.3. INCREASE lantus 35 units daily to 40 units daily. INCREASE Actos 15 mg daily to 30 mg daily.  Hospital visits:  None in previous 6 months  Medications: Outpatient Encounter Medications as of 03/01/2022  Medication Sig   amLODipine (NORVASC) 5 MG tablet TAKE ONE TABLET BY MOUTH BEFORE BREAKFAST   atorvastatin (LIPITOR) 80 MG tablet TAKE ONE TABLET BY MOUTH AT BEDTIME   Blood Glucose Monitoring Suppl DEVI Check blood sugars twice daily. E11.9   dicyclomine (BENTYL) 20 MG tablet Take 1 tablet (20 mg total) by mouth 2 (two) times daily.   empagliflozin (JARDIANCE) 25 MG TABS tablet Take 1 tablet (25 mg total) by mouth daily before breakfast.   HYDROcodone-acetaminophen (NORCO/VICODIN) 5-325 MG tablet Take 2 tablets by mouth every 4 (four) hours as needed.   Injection Device for  Insulin (CEQUR SIMPLICITY INSERTER) MISC 1 Device by Does not apply route daily in the afternoon.   insulin glargine (LANTUS SOLOSTAR) 100 UNIT/ML Solostar Pen Inject 40 Units into the skin daily.   Insulin Pen Needle 32G X 6 MM MISC 1 each by Does not apply route daily in the afternoon.   lidocaine (LIDODERM) 5 % Place 1 patch onto the skin daily. Remove & Discard patch within 12 hours or as directed by MD   losartan (COZAAR) 50 MG tablet Take 1 tablet (50 mg total) by mouth daily.   magnesium 30 MG tablet Take 30 mg by mouth once a week.   Multiple Vitamins-Minerals (MULTIVITAMIN WITH MINERALS) tablet Take 1 tablet by mouth daily.   pioglitazone (ACTOS) 30 MG tablet Take 1 tablet (30 mg total) by mouth daily.   potassium chloride (KLOR-CON M) 10 MEQ tablet TAKE ONE TABLET BY MOUTH EVERY MORNING   VASCEPA 1 g capsule TAKE 2 CAPSULES(2 GRAMS) BY MOUTH TWICE DAILY   No facility-administered encounter medications on file as of 03/01/2022.   BP Readings from Last 3 Encounters:  02/01/22 120/70  11/07/21 118/74  07/21/21 120/70    Pulse Readings from Last 3 Encounters:  02/01/22 83  11/07/21 75  07/21/21 63    Lab Results  Component Value Date/Time   HGBA1C 9.3 (A) 02/01/2022 12:05 PM   HGBA1C 9.3 (A) 07/21/2021 09:24 AM   HGBA1C 9.5 (H) 02/09/2021 12:52 PM   HGBA1C 13.8 (H) 10/18/2020 10:21 AM   Lab Results  Component Value Date   CREATININE 0.68 11/07/2021   BUN 8 11/07/2021   GFR 85.51 12/09/2020   GFRNONAA >60 07/17/2021  GFRAA 102 10/20/2019   NA 139 11/07/2021   K 4.7 11/07/2021   CALCIUM 10.3 11/07/2021   CO2 21 11/07/2021   03-01-2022: 1st attempt left VM 03-02-2022: 2nd attempt left VM 03-03-2022: 3rd attempt left VM  [place] Clinical Pharmacist Assistant [phone]

## 2022-03-13 ENCOUNTER — Encounter: Payer: Self-pay | Admitting: Nurse Practitioner

## 2022-03-13 ENCOUNTER — Ambulatory Visit: Payer: Medicare PPO | Admitting: Nurse Practitioner

## 2022-03-13 VITALS — BP 98/42 | HR 84 | Temp 98.4°F | Ht 63.0 in | Wt 118.0 lb

## 2022-03-13 DIAGNOSIS — E782 Mixed hyperlipidemia: Secondary | ICD-10-CM | POA: Diagnosis not present

## 2022-03-13 DIAGNOSIS — E1165 Type 2 diabetes mellitus with hyperglycemia: Secondary | ICD-10-CM | POA: Diagnosis not present

## 2022-03-13 DIAGNOSIS — E2839 Other primary ovarian failure: Secondary | ICD-10-CM | POA: Diagnosis not present

## 2022-03-13 DIAGNOSIS — R058 Other specified cough: Secondary | ICD-10-CM | POA: Diagnosis not present

## 2022-03-13 DIAGNOSIS — Z720 Tobacco use: Secondary | ICD-10-CM | POA: Diagnosis not present

## 2022-03-13 DIAGNOSIS — I10 Essential (primary) hypertension: Secondary | ICD-10-CM | POA: Diagnosis not present

## 2022-03-13 DIAGNOSIS — Z1211 Encounter for screening for malignant neoplasm of colon: Secondary | ICD-10-CM

## 2022-03-13 DIAGNOSIS — Z23 Encounter for immunization: Secondary | ICD-10-CM

## 2022-03-13 LAB — CBC
Hematocrit: 44.4 % (ref 34.0–46.6)
Hemoglobin: 14.7 g/dL (ref 11.1–15.9)
MCH: 28.8 pg (ref 26.6–33.0)
MCHC: 33.1 g/dL (ref 31.5–35.7)
MCV: 87 fL (ref 79–97)
Platelets: 172 10*3/uL (ref 150–450)
RBC: 5.1 x10E6/uL (ref 3.77–5.28)
RDW: 12.8 % (ref 11.7–15.4)
WBC: 10.4 10*3/uL (ref 3.4–10.8)

## 2022-03-13 LAB — CMP14+EGFR
ALT: 14 IU/L (ref 0–32)
AST: 20 IU/L (ref 0–40)
Albumin/Globulin Ratio: 1.5 (ref 1.2–2.2)
Albumin: 4.6 g/dL (ref 3.8–4.8)
Alkaline Phosphatase: 146 IU/L — ABNORMAL HIGH (ref 44–121)
BUN/Creatinine Ratio: 21 (ref 12–28)
BUN: 15 mg/dL (ref 8–27)
Bilirubin Total: 0.3 mg/dL (ref 0.0–1.2)
CO2: 22 mmol/L (ref 20–29)
Calcium: 10.4 mg/dL — ABNORMAL HIGH (ref 8.7–10.3)
Chloride: 98 mmol/L (ref 96–106)
Creatinine, Ser: 0.7 mg/dL (ref 0.57–1.00)
Globulin, Total: 3 g/dL (ref 1.5–4.5)
Glucose: 200 mg/dL — ABNORMAL HIGH (ref 70–99)
Potassium: 4.6 mmol/L (ref 3.5–5.2)
Sodium: 139 mmol/L (ref 134–144)
Total Protein: 7.6 g/dL (ref 6.0–8.5)
eGFR: 90 mL/min/{1.73_m2} (ref >59)

## 2022-03-13 MED ORDER — SHINGRIX 50 MCG/0.5ML IM SUSR: 0.5000 mL | Freq: Once | INTRAMUSCULAR | 0 refills | Status: AC

## 2022-03-13 NOTE — Patient Instructions (Addendum)
-   [facility] will call you to send a cologuard kit for colon cancer screening - hold amlodipine for one week and call or send mychart message with your daily readings.  - breast center will about bone density - send [place] a copy of your pneumonia

## 2022-03-13 NOTE — Telephone Encounter (Signed)
Patient was advised and was only able to provide BS from this morning which was 134 not fasting

## 2022-03-13 NOTE — Progress Notes (Signed)
I,Sheena H Holbrook,acting as a Education administrator for [name], FNP.,have documented all relevant documentation on the behalf of [name], FNP,as directed by  [name], FNP while in the presence of [name], [place].    Subjective:     Patient ID: Gabriella Acosta , female    DOB: 04/29/1945 , 77 y.o.   MRN: [id number]   Chief Complaint  Patient presents with   Hypertension   Diabetes Mellitus    HPI  Patient presents today for hypertension and DM follow up. Patient reports she has not been taking her insulin, she states she feels the dose is too high, she had severe side effects. She reached out to Endo 02/23/22 but did not hear back from them. Patient reports BG today 134.   She has not taken her Lantus in the last month due to not feeling well.      Past Medical History:  Diagnosis Date   Diabetes mellitus without complication ([facility])    Hypertension    Type 2 diabetes mellitus ([place]) 07/22/2020     Family History  Problem Relation Age of Onset   Diabetes Mother    Diabetes Father    Heart disease Father    Breast cancer Neg Hx      Current Outpatient Medications:    amLODipine (NORVASC) 5 MG tablet, TAKE ONE TABLET BY MOUTH BEFORE BREAKFAST, Disp: 90 tablet, Rfl: 2   atorvastatin (LIPITOR) 80 MG tablet, TAKE ONE TABLET BY MOUTH AT BEDTIME, Disp: 90 tablet, Rfl: 1   Blood Glucose Monitoring Suppl DEVI, Check blood sugars twice daily. E11.9, Disp: 1 kit, Rfl: 0   dicyclomine (BENTYL) 20 MG tablet, Take 1 tablet (20 mg total) by mouth 2 (two) times daily., Disp: 20 tablet, Rfl: 0   empagliflozin (JARDIANCE) 25 MG TABS tablet, Take 1 tablet (25 mg total) by mouth daily before breakfast., Disp: 90 tablet, Rfl: 3   HYDROcodone-acetaminophen (NORCO/VICODIN) 5-325 MG tablet, Take 2 tablets by mouth every 4 (four) hours as needed., Disp: 10 tablet, Rfl: 0   Injection Device for Insulin (CEQUR SIMPLICITY INSERTER) MISC, 1 Device by Does not apply route daily in the afternoon.,  Disp: 1 each, Rfl: 0   Insulin Pen Needle 32G X 6 MM MISC, 1 each by Does not apply route daily in the afternoon., Disp: 100 each, Rfl: 3   lidocaine (LIDODERM) 5 %, Place 1 patch onto the skin daily. Remove & Discard patch within 12 hours or as directed by MD, Disp: 30 patch, Rfl: 0   losartan (COZAAR) 50 MG tablet, Take 1 tablet (50 mg total) by mouth daily., Disp: 90 tablet, Rfl: 1   magnesium 30 MG tablet, Take 30 mg by mouth once a week., Disp: , Rfl:    Multiple Vitamins-Minerals (MULTIVITAMIN WITH MINERALS) tablet, Take 1 tablet by mouth daily., Disp: , Rfl:    pioglitazone (ACTOS) 30 MG tablet, Take 1 tablet (30 mg total) by mouth daily., Disp: 90 tablet, Rfl: 3   potassium chloride (KLOR-CON M) 10 MEQ tablet, TAKE ONE TABLET BY MOUTH EVERY MORNING, Disp: 90 tablet, Rfl: 1   VASCEPA 1 g capsule, TAKE 2 CAPSULES(2 GRAMS) BY MOUTH TWICE DAILY, Disp: 120 capsule, Rfl: 3   insulin glargine (LANTUS SOLOSTAR) 100 UNIT/ML Solostar Pen, Inject 40 Units into the skin daily. (Patient not taking: Reported on 03/13/2022), Disp: 45 mL, Rfl: 3   No Known Allergies   Review of Systems  Constitutional: Negative.   Respiratory: Negative.    Cardiovascular:  Negative.   Gastrointestinal: Negative.   Endocrine: Negative for polydipsia, polyphagia and polyuria.  Neurological: Negative.      Today's Vitals   03/13/22 1052  BP: (!) 98/42  Pulse: 84  Temp: 98.4 F (36.9 C)  TempSrc: Oral  SpO2: 98%  Weight: 118 lb (53.5 kg)  Height: 5\' 3"  (1.6 m)   Body mass index is 20.9 kg/m.   Objective:  Physical Exam Vitals reviewed.  Constitutional:      General: She is not in acute distress.    Appearance: Normal appearance. She is well-developed and normal weight.  Cardiovascular:     Rate and Rhythm: Normal rate and regular rhythm.     Pulses: Normal pulses.     Heart sounds: Normal heart sounds. No murmur heard. Pulmonary:     Effort: Pulmonary effort is normal. No respiratory distress.      Breath sounds: Normal breath sounds. No wheezing.  Skin:    General: Skin is warm and dry.     Capillary Refill: Capillary refill takes less than 2 seconds.  Neurological:     General: No focal deficit present.     Mental Status: She is alert and oriented to person, place, and time.     Cranial Nerves: No cranial nerve deficit.     Motor: No weakness.  Psychiatric:        Mood and Affect: Mood normal. Mood is not anxious.        Behavior: Behavior normal. Behavior is not agitated.        Thought Content: Thought content normal.        Judgment: Judgment normal.         Assessment And Plan:     1. Uncontrolled type 2 diabetes mellitus with hyperglycemia (HCC) Comments: Last HgbA1c was 9.3, she was unable to tolerate the recent increase of Lantus. She is to make [place] aware if she does not hear from Endo, will then f/u.  2. Essential hypertension Comments: Blood pressure is low today.  She is to hold her blood pressure medication. - CBC - CMP14+EGFR  3. Mixed hyperlipidemia Comments: Cholesterol levels are stable.  Continue current medications.  Tolerating well - CMP14+EGFR  4. Other cough Comments: Will order CT scan of chest due to history of smoking. Having an intermittent dry cough - CT Chest Wo Contrast; Future  5. Decreased estrogen level - DG Bone Density; Future  6. Colon cancer screening She is to return the cologuard she was sent in October  7. Tobacco abuse Smoking cessation instruction/counseling given:  counseled patient on the dangers of tobacco use, advised patient to stop smoking, and reviewed strategies to maximize success  8. Encounter for herpes zoster vaccination Rx sent to pharmacy as her transrx was rejected - Zoster Vaccine Adjuvanted [facility]) injection; Inject 0.5 mLs into the muscle once for 1 dose.  Dispense: 1 each; Refill: 0    Patient was given opportunity to ask questions. Patient verbalized understanding of the plan and was able to repeat  key elements of the plan. All questions were answered to their satisfaction.  [name], FNP   I, [name], FNP, have reviewed all documentation for this visit. The documentation on 03/13/22 for the exam, diagnosis, procedures, and orders are all accurate and complete.   IF YOU HAVE BEEN REFERRED TO A SPECIALIST, IT MAY TAKE 1-2 WEEKS TO SCHEDULE/PROCESS THE REFERRAL. IF YOU HAVE NOT HEARD FROM US/SPECIALIST IN TWO WEEKS, PLEASE GIVE [place] A CALL AT [phone] X  [phone].   THE PATIENT IS ENCOURAGED TO PRACTICE SOCIAL DISTANCING DUE TO THE COVID-19 PANDEMIC.

## 2022-03-13 NOTE — Telephone Encounter (Signed)
Patient in PCP office today. States she did hear back from Endo office regarding concerns about insulin dose being too high. Patient reports it made her feel very unwell, profuse sweating, weakness with difficulty ambulating. Patient states she has not taken ANY insulin since 02/23/22. Patient reports BG today was 134.   PLEASE ADVISE PATIENT ASAP - thank you!

## 2022-03-24 ENCOUNTER — Telehealth: Payer: Self-pay

## 2022-03-24 NOTE — Progress Notes (Cosign Needed)
03-24-2022: Unable to leave a VM for patient to inform her that [facility] isn't able to fill shingrix that was sent and is it ok to send to [facility] on file. Gave Ali lynch the ok to send to [facility].  [place] Pharmacist Assistant [phone]

## 2022-03-26 ENCOUNTER — Encounter: Payer: Self-pay | Admitting: Nurse Practitioner

## 2022-03-30 ENCOUNTER — Telehealth: Payer: Self-pay

## 2022-03-30 NOTE — Progress Notes (Signed)
Care Management & Coordination Services Pharmacy Team  Reason for Encounter: Medication coordination and delivery  Contacted patient to discuss medications and coordinate delivery from [facility]. {US HC Outreach:28874} Cycle dispensing form sent to *** for review.   Last adherence delivery date: 03-13-2022  Patient is due for next adherence delivery on: 04-11-2022  This delivery to include: Adherence Packaging  30 Days  Losartan 50 mg daily at breakfast Atorvastatin 80 mg daily before breakfast Potassium Chloride 10 mg daily before breakfast Jardiance 25 mg daily at breakfast Amlodipine 5 mg daily before breakfast  Patient declined the following medications this month: ***  No refill request needed.  {Delivery BK:1911189   Any concerns about your medications? {yes/no:20286}  How often do you forget or accidentally miss a dose? {Missed doses:25554}  Do you use a pillbox? {yes/no:20286}  Is patient in packaging {yes/no:20286}  If yes  What is the date on your next pill pack?  Any concerns or issues with your packaging?   Recent blood pressure readings are as follows:***  Recent blood glucose readings are as follows:***   Chart review: Recent office visits:  03-13-2022 Gabriella Acosta, [place]. Glucose= 200, Calcium= 10.4, Alkaline Phosphatase= 146  11-07-2021 Gabriella Brine, FNP. Glucose 155. HDL= 154   Recent consult visits:  02-01-2022 Shamleffer, Melanie Crazier, MD (Endo). Glucose fasting POC= 226, POCT A1C= 9.3. INCREASE lantus 35 units daily to 40 units daily. INCREASE Actos 15 mg daily to 30 mg daily.   Hospital visits:  None in previous 6 months  Medications: Outpatient Encounter Medications as of 03/30/2022  Medication Sig   amLODipine (NORVASC) 5 MG tablet TAKE ONE TABLET BY MOUTH BEFORE BREAKFAST   atorvastatin (LIPITOR) 80 MG tablet TAKE ONE TABLET BY MOUTH AT BEDTIME   Blood Glucose Monitoring Suppl DEVI Check blood sugars twice daily. E11.9    dicyclomine (BENTYL) 20 MG tablet Take 1 tablet (20 mg total) by mouth 2 (two) times daily.   empagliflozin (JARDIANCE) 25 MG TABS tablet Take 1 tablet (25 mg total) by mouth daily before breakfast.   HYDROcodone-acetaminophen (NORCO/VICODIN) 5-325 MG tablet Take 2 tablets by mouth every 4 (four) hours as needed.   Injection Device for Insulin (CEQUR SIMPLICITY INSERTER) MISC 1 Device by Does not apply route daily in the afternoon.   insulin glargine (LANTUS SOLOSTAR) 100 UNIT/ML Solostar Pen Inject 40 Units into the skin daily. (Patient not taking: Reported on 03/13/2022)   Insulin Pen Needle 32G X 6 MM MISC 1 each by Does not apply route daily in the afternoon.   lidocaine (LIDODERM) 5 % Place 1 patch onto the skin daily. Remove & Discard patch within 12 hours or as directed by MD   losartan (COZAAR) 50 MG tablet Take 1 tablet (50 mg total) by mouth daily.   magnesium 30 MG tablet Take 30 mg by mouth once a week.   Multiple Vitamins-Minerals (MULTIVITAMIN WITH MINERALS) tablet Take 1 tablet by mouth daily.   pioglitazone (ACTOS) 30 MG tablet Take 1 tablet (30 mg total) by mouth daily.   potassium chloride (KLOR-CON M) 10 MEQ tablet TAKE ONE TABLET BY MOUTH EVERY MORNING   VASCEPA 1 g capsule TAKE 2 CAPSULES(2 GRAMS) BY MOUTH TWICE DAILY   No facility-administered encounter medications on file as of 03/30/2022.   BP Readings from Last 3 Encounters:  03/13/22 (!) 98/42  02/01/22 120/70  11/07/21 118/74    Pulse Readings from Last 3 Encounters:  03/13/22 84  02/01/22 83  11/07/21 75  Lab Results  Component Value Date/Time   HGBA1C 9.3 (A) 02/01/2022 12:05 PM   HGBA1C 9.3 (A) 07/21/2021 09:24 AM   HGBA1C 9.5 (H) 02/09/2021 12:52 PM   HGBA1C 13.8 (H) 10/18/2020 10:21 AM   Lab Results  Component Value Date   CREATININE 0.70 03/13/2022   BUN 15 03/13/2022   GFR 85.51 12/09/2020   GFRNONAA >60 07/17/2021   GFRAA 102 10/20/2019   NA 139 03/13/2022   K 4.6 03/13/2022   CALCIUM  10.4 (H) 03/13/2022   CO2 22 03/13/2022   03-30-2022: 1st attempt VM full 03-31-2022: 2nd attempt VM full  [place] Clinical Pharmacist Assistant [phone]

## 2022-05-01 ENCOUNTER — Telehealth: Payer: Self-pay

## 2022-05-01 NOTE — Progress Notes (Signed)
Care Management & Coordination Services Pharmacy Team  Reason for Encounter: Medication coordination and delivery  Contacted patient to discuss medications and coordinate delivery from [facility]. Unsuccessful outreach. Left voicemail for patient to return call. Cycle dispensing form sent to [name] for review.   Last adherence delivery date: 04-11-2022     Patient is due for next adherence delivery on: 05-11-2022  This delivery to include: Adherence Packaging  30 Days  Losartan 50 mg daily at breakfast Atorvastatin 80 mg daily before breakfast Potassium Chloride 10 mg daily before breakfast Jardiance 25 mg daily at breakfast Amlodipine 5 mg daily before breakfast Actos 30 mg daily at breakfast  Patient declined the following medications this month: Unable to reach patient  No refill request needed.  Delivery scheduled for 05-11-2022. Unable to speak with patient to confirm date.    Chart review: Recent office visits:  03-13-2022 [name], FNP. Glucose= 200, Calcium= 10.4, Alkaline Phosphatase= 146   11-07-2021 [name], FNP. Glucose 155. HDL= 154   Recent consult visits:  02-01-2022 Shamleffer, Konrad Dolores, MD (Endo). Glucose fasting POC= 226, POCT A1C= 9.3. INCREASE lantus 35 units daily to 40 units daily. INCREASE Actos 15 mg daily to 30 mg daily.   Hospital visits:  None in previous 6 months  Medications: Outpatient Encounter Medications as of 05/01/2022  Medication Sig   amLODipine (NORVASC) 5 MG tablet TAKE ONE TABLET BY MOUTH BEFORE BREAKFAST   atorvastatin (LIPITOR) 80 MG tablet TAKE ONE TABLET BY MOUTH AT BEDTIME   Blood Glucose Monitoring Suppl DEVI Check blood sugars twice daily. E11.9   dicyclomine (BENTYL) 20 MG tablet Take 1 tablet (20 mg total) by mouth 2 (two) times daily.   empagliflozin (JARDIANCE) 25 MG TABS tablet Take 1 tablet (25 mg total) by mouth daily before breakfast.   HYDROcodone-acetaminophen (NORCO/VICODIN)  5-325 MG tablet Take 2 tablets by mouth every 4 (four) hours as needed.   Injection Device for Insulin (CEQUR SIMPLICITY INSERTER) MISC 1 Device by Does not apply route daily in the afternoon.   insulin glargine (LANTUS SOLOSTAR) 100 UNIT/ML Solostar Pen Inject 40 Units into the skin daily. (Patient not taking: Reported on 03/13/2022)   Insulin Pen Needle 32G X 6 MM MISC 1 each by Does not apply route daily in the afternoon.   lidocaine (LIDODERM) 5 % Place 1 patch onto the skin daily. Remove & Discard patch within 12 hours or as directed by MD   losartan (COZAAR) 50 MG tablet Take 1 tablet (50 mg total) by mouth daily.   magnesium 30 MG tablet Take 30 mg by mouth once a week.   Multiple Vitamins-Minerals (MULTIVITAMIN WITH MINERALS) tablet Take 1 tablet by mouth daily.   pioglitazone (ACTOS) 30 MG tablet Take 1 tablet (30 mg total) by mouth daily.   potassium chloride (KLOR-CON M) 10 MEQ tablet TAKE ONE TABLET BY MOUTH EVERY MORNING   VASCEPA 1 g capsule TAKE 2 CAPSULES(2 GRAMS) BY MOUTH TWICE DAILY   No facility-administered encounter medications on file as of 05/01/2022.   BP Readings from Last 3 Encounters:  03/13/22 (!) 98/42  02/01/22 120/70  11/07/21 118/74    Pulse Readings from Last 3 Encounters:  03/13/22 84  02/01/22 83  11/07/21 75    Lab Results  Component Value Date/Time   HGBA1C 9.3 (A) 02/01/2022 12:05 PM   HGBA1C 9.3 (A) 07/21/2021 09:24 AM   HGBA1C 9.5 (H) 02/09/2021 12:52 PM   HGBA1C 13.8 (H) 10/18/2020 10:21 AM   Lab  Results  Component Value Date   CREATININE 0.70 03/13/2022   BUN 15 03/13/2022   GFR 85.51 12/09/2020   GFRNONAA >60 07/17/2021   GFRAA 102 10/20/2019   NA 139 03/13/2022   K 4.6 03/13/2022   CALCIUM 10.4 (H) 03/13/2022   CO2 22 03/13/2022  05-01-2022: 1st attempt no VM 05-02-2022: 2nd attempt VM full 05-03-2022: 3rd attempt VM Full  [place] [facility] Clinical Pharmacist Assistant [phone]

## 2022-05-15 ENCOUNTER — Other Ambulatory Visit: Payer: Medicare PPO

## 2022-05-30 ENCOUNTER — Telehealth: Payer: Self-pay

## 2022-05-30 NOTE — Progress Notes (Signed)
Care Management & Coordination Services Pharmacy Team  Reason for Encounter: Medication coordination and delivery  Contacted patient to discuss medications and coordinate delivery from [facility]. {US HC Outreach:28874} Cycle dispensing form sent to *** for review.   Last adherence delivery date: 05-08-2022  Patient is due for next adherence delivery on: 06-09-2022  This delivery to include: Adherence Packaging  30 Days  Losartan 50 mg daily at breakfast Atorvastatin 80 mg daily before breakfast Potassium Chloride 10 mg daily before breakfast Jardiance 25 mg daily at breakfast Amlodipine 5 mg daily before breakfast Actos 30 mg daily at breakfast  Patient declined the following medications this month: ***  Refills requested from providers include: Atorvastatin  {Delivery date:25786}   Any concerns about your medications? {yes/no:20286}  How often do you forget or accidentally miss a dose? {Missed doses:25554}  Do you use a pillbox? {yes/no:20286}  Is patient in packaging {yes/no:20286}  If yes  What is the date on your next pill pack?  Any concerns or issues with your packaging?   Recent blood pressure readings are as follows:***  Recent blood glucose readings are as follows:***  Chart review: Recent office visits:  03-13-2022 [name], FNP. Follow up visit for HTN and DM. Shingrix given. No changes.  Recent consult visits:  02-01-2022 Shamleffer, Konrad Dolores, MD (Endo). Glucose fasting POC= 226, POCT A1C= 9.3. INCREASE lantus 35 units daily to 40 units daily. INCREASE Actos 15 mg daily to 30 mg daily.   Hospital visits:  None in previous 6 months  Medications: Outpatient Encounter Medications as of 05/30/2022  Medication Sig   amLODipine (NORVASC) 5 MG tablet TAKE ONE TABLET BY MOUTH BEFORE BREAKFAST   atorvastatin (LIPITOR) 80 MG tablet TAKE ONE TABLET BY MOUTH AT BEDTIME   Blood Glucose Monitoring Suppl DEVI Check blood sugars twice daily.  E11.9   dicyclomine (BENTYL) 20 MG tablet Take 1 tablet (20 mg total) by mouth 2 (two) times daily.   empagliflozin (JARDIANCE) 25 MG TABS tablet Take 1 tablet (25 mg total) by mouth daily before breakfast.   HYDROcodone-acetaminophen (NORCO/VICODIN) 5-325 MG tablet Take 2 tablets by mouth every 4 (four) hours as needed.   Injection Device for Insulin (CEQUR SIMPLICITY INSERTER) MISC 1 Device by Does not apply route daily in the afternoon.   insulin glargine (LANTUS SOLOSTAR) 100 UNIT/ML Solostar Pen Inject 40 Units into the skin daily. (Patient not taking: Reported on 03/13/2022)   Insulin Pen Needle 32G X 6 MM MISC 1 each by Does not apply route daily in the afternoon.   lidocaine (LIDODERM) 5 % Place 1 patch onto the skin daily. Remove & Discard patch within 12 hours or as directed by MD   losartan (COZAAR) 50 MG tablet Take 1 tablet (50 mg total) by mouth daily.   magnesium 30 MG tablet Take 30 mg by mouth once a week.   Multiple Vitamins-Minerals (MULTIVITAMIN WITH MINERALS) tablet Take 1 tablet by mouth daily.   pioglitazone (ACTOS) 30 MG tablet Take 1 tablet (30 mg total) by mouth daily.   potassium chloride (KLOR-CON M) 10 MEQ tablet TAKE ONE TABLET BY MOUTH EVERY MORNING   VASCEPA 1 g capsule TAKE 2 CAPSULES(2 GRAMS) BY MOUTH TWICE DAILY   No facility-administered encounter medications on file as of 05/30/2022.   BP Readings from Last 3 Encounters:  03/13/22 (!) 98/42  02/01/22 120/70  11/07/21 118/74    Pulse Readings from Last 3 Encounters:  03/13/22 84  02/01/22 83  11/07/21 75  Lab Results  Component Value Date/Time   HGBA1C 9.3 (A) 02/01/2022 12:05 PM   HGBA1C 9.3 (A) 07/21/2021 09:24 AM   HGBA1C 9.5 (H) 02/09/2021 12:52 PM   HGBA1C 13.8 (H) 10/18/2020 10:21 AM   Lab Results  Component Value Date   CREATININE 0.70 03/13/2022   BUN 15 03/13/2022   GFR 85.51 12/09/2020   GFRNONAA >60 07/17/2021   GFRAA 102 10/20/2019   NA 139 03/13/2022   K 4.6 03/13/2022    CALCIUM 10.4 (H) 03/13/2022   CO2 22 03/13/2022  05-30-2022: 1st attempt no VM 05-31-2022: 2nd attempt no VM  [place] [facility] CMA Clinical Pharmacist Assistant [phone]

## 2022-05-31 ENCOUNTER — Other Ambulatory Visit: Payer: Self-pay

## 2022-05-31 DIAGNOSIS — E782 Mixed hyperlipidemia: Secondary | ICD-10-CM

## 2022-05-31 MED ORDER — ATORVASTATIN CALCIUM 80 MG PO TABS: ORAL_TABLET | ORAL | 1 refills | Status: DC

## 2022-06-02 ENCOUNTER — Other Ambulatory Visit: Payer: Self-pay

## 2022-06-02 DIAGNOSIS — E782 Mixed hyperlipidemia: Secondary | ICD-10-CM

## 2022-06-02 MED ORDER — ATORVASTATIN CALCIUM 80 MG PO TABS: ORAL_TABLET | ORAL | 1 refills | Status: DC

## 2022-06-15 IMAGING — CR DG RIBS 2V*R*
2 series · 2 of 2 positions shown · non-contrast
Comparison: 10/02/2018

CLINICAL DATA: Pt started going to gym three weeks ago no known
injury but pain started shortly after the gym, pain in lateral and
anterior right mud to lower ribs, non smoker no prior chest
conditions.

EXAM:
RIGHT RIBS - 2 VIEW

[w ribs ap lower right]
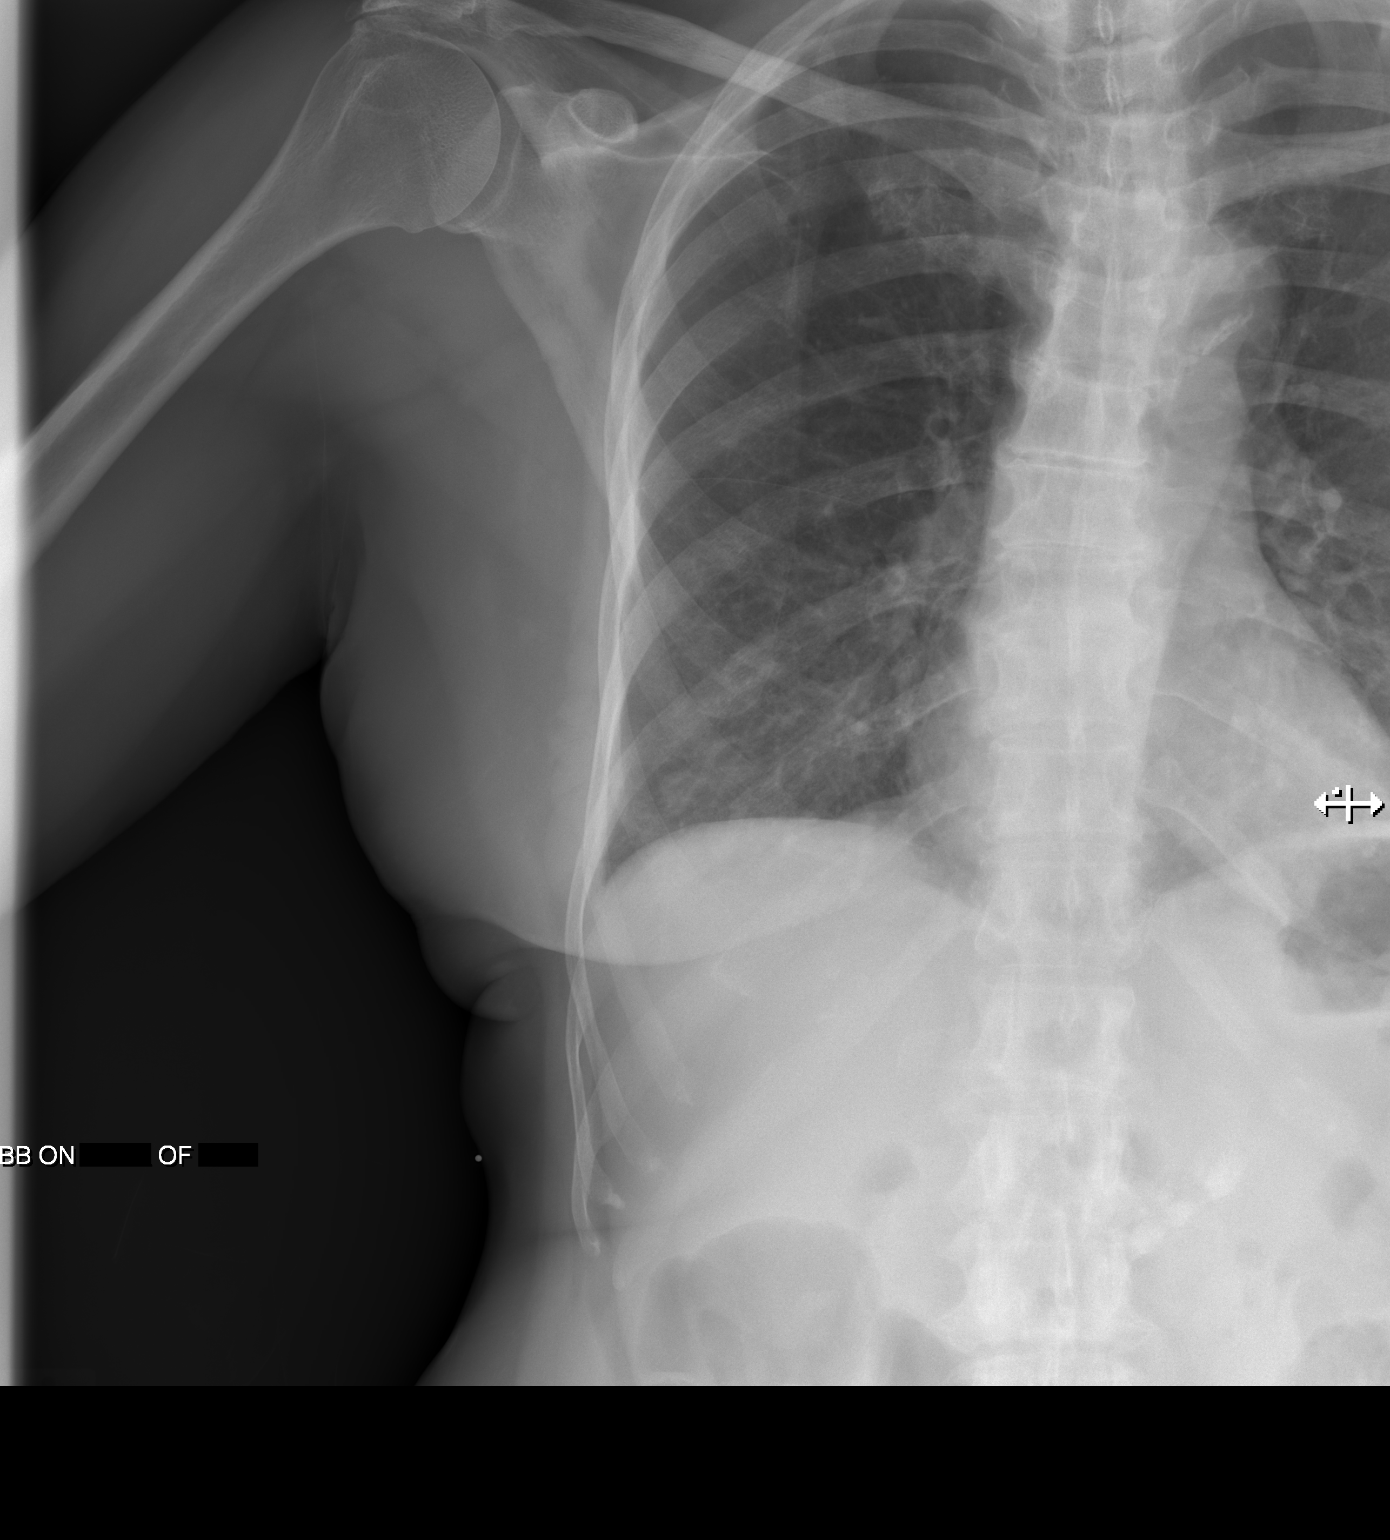

[w ribs obl right]
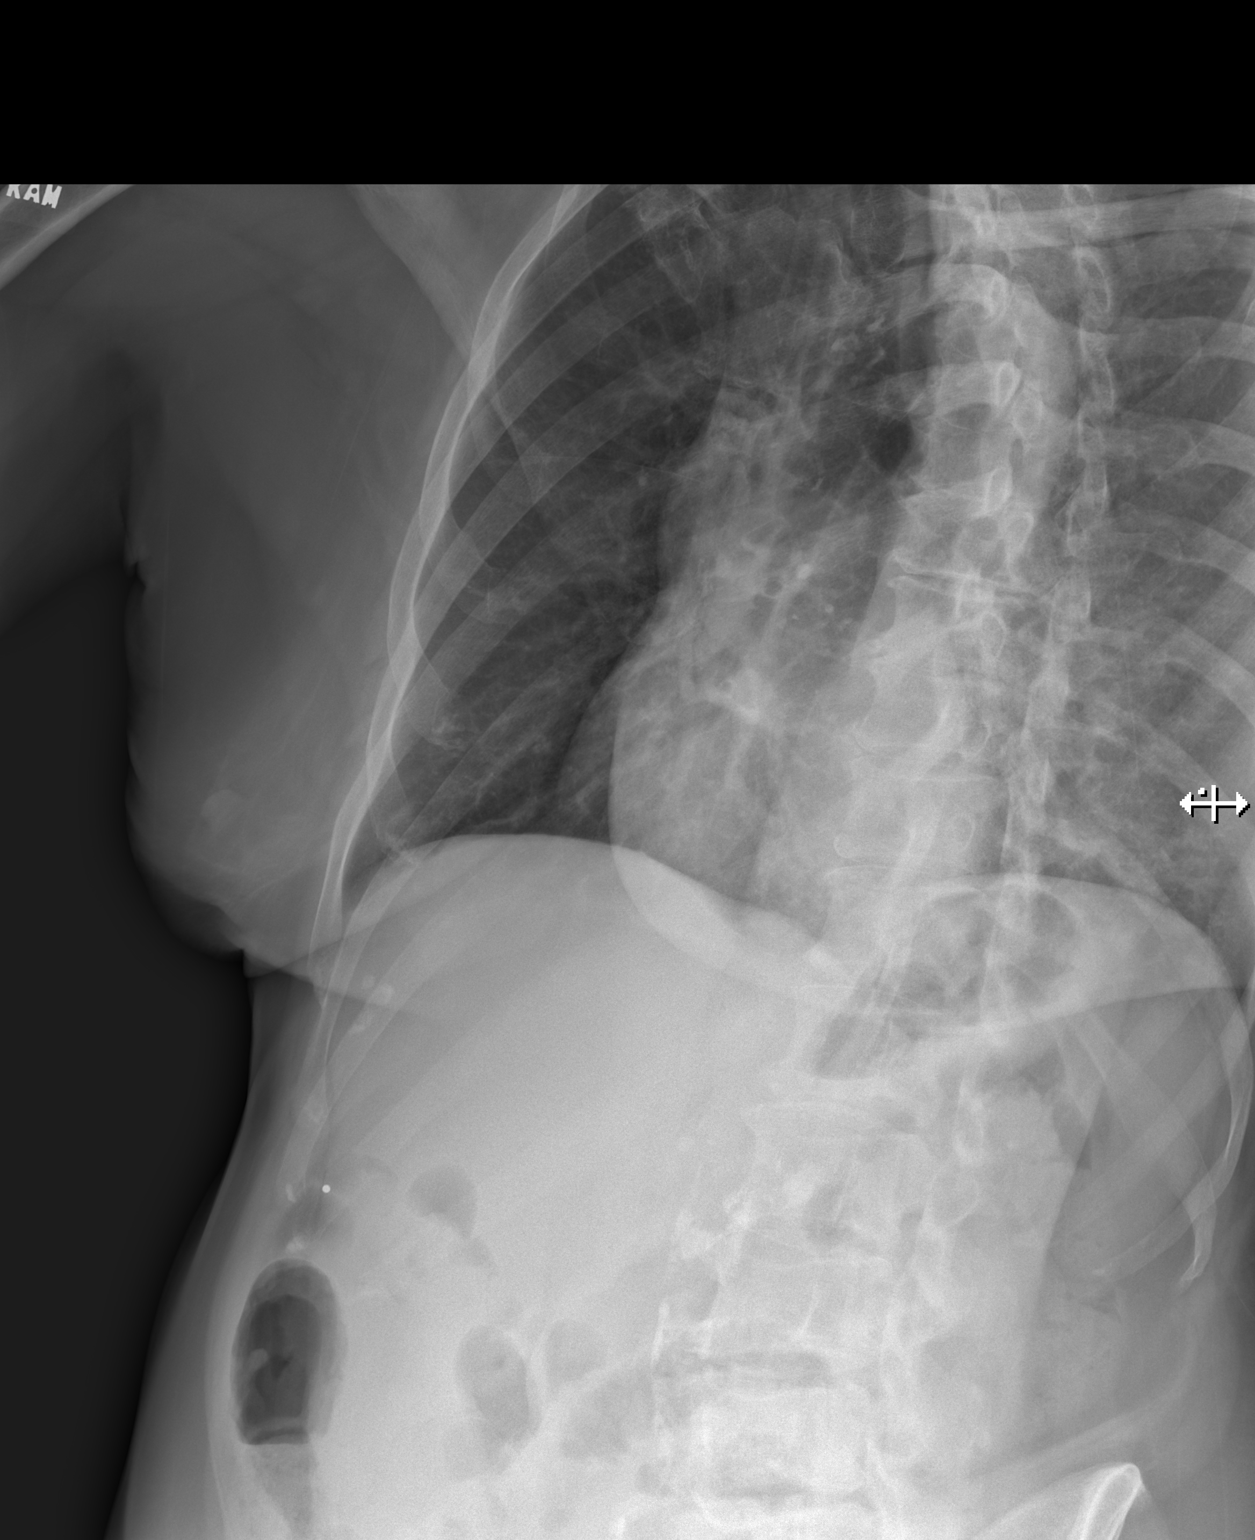

[2 of 2 positions shown; findings below may reference images not displayed]

FINDINGS: No fracture or other bone lesions are seen involving the ribs.
IMPRESSION: Negative.

## 2022-06-15 IMAGING — CR DG CHEST 2V
2 series · 2 of 2 positions shown · non-contrast
Comparison: 10/02/2018.

CLINICAL DATA: Anterior right and lateral chest wall pain. No
specific injury. Patient began going to the gym 3 weeks ago.

EXAM:
CHEST - 2 VIEW

[w chest pa]
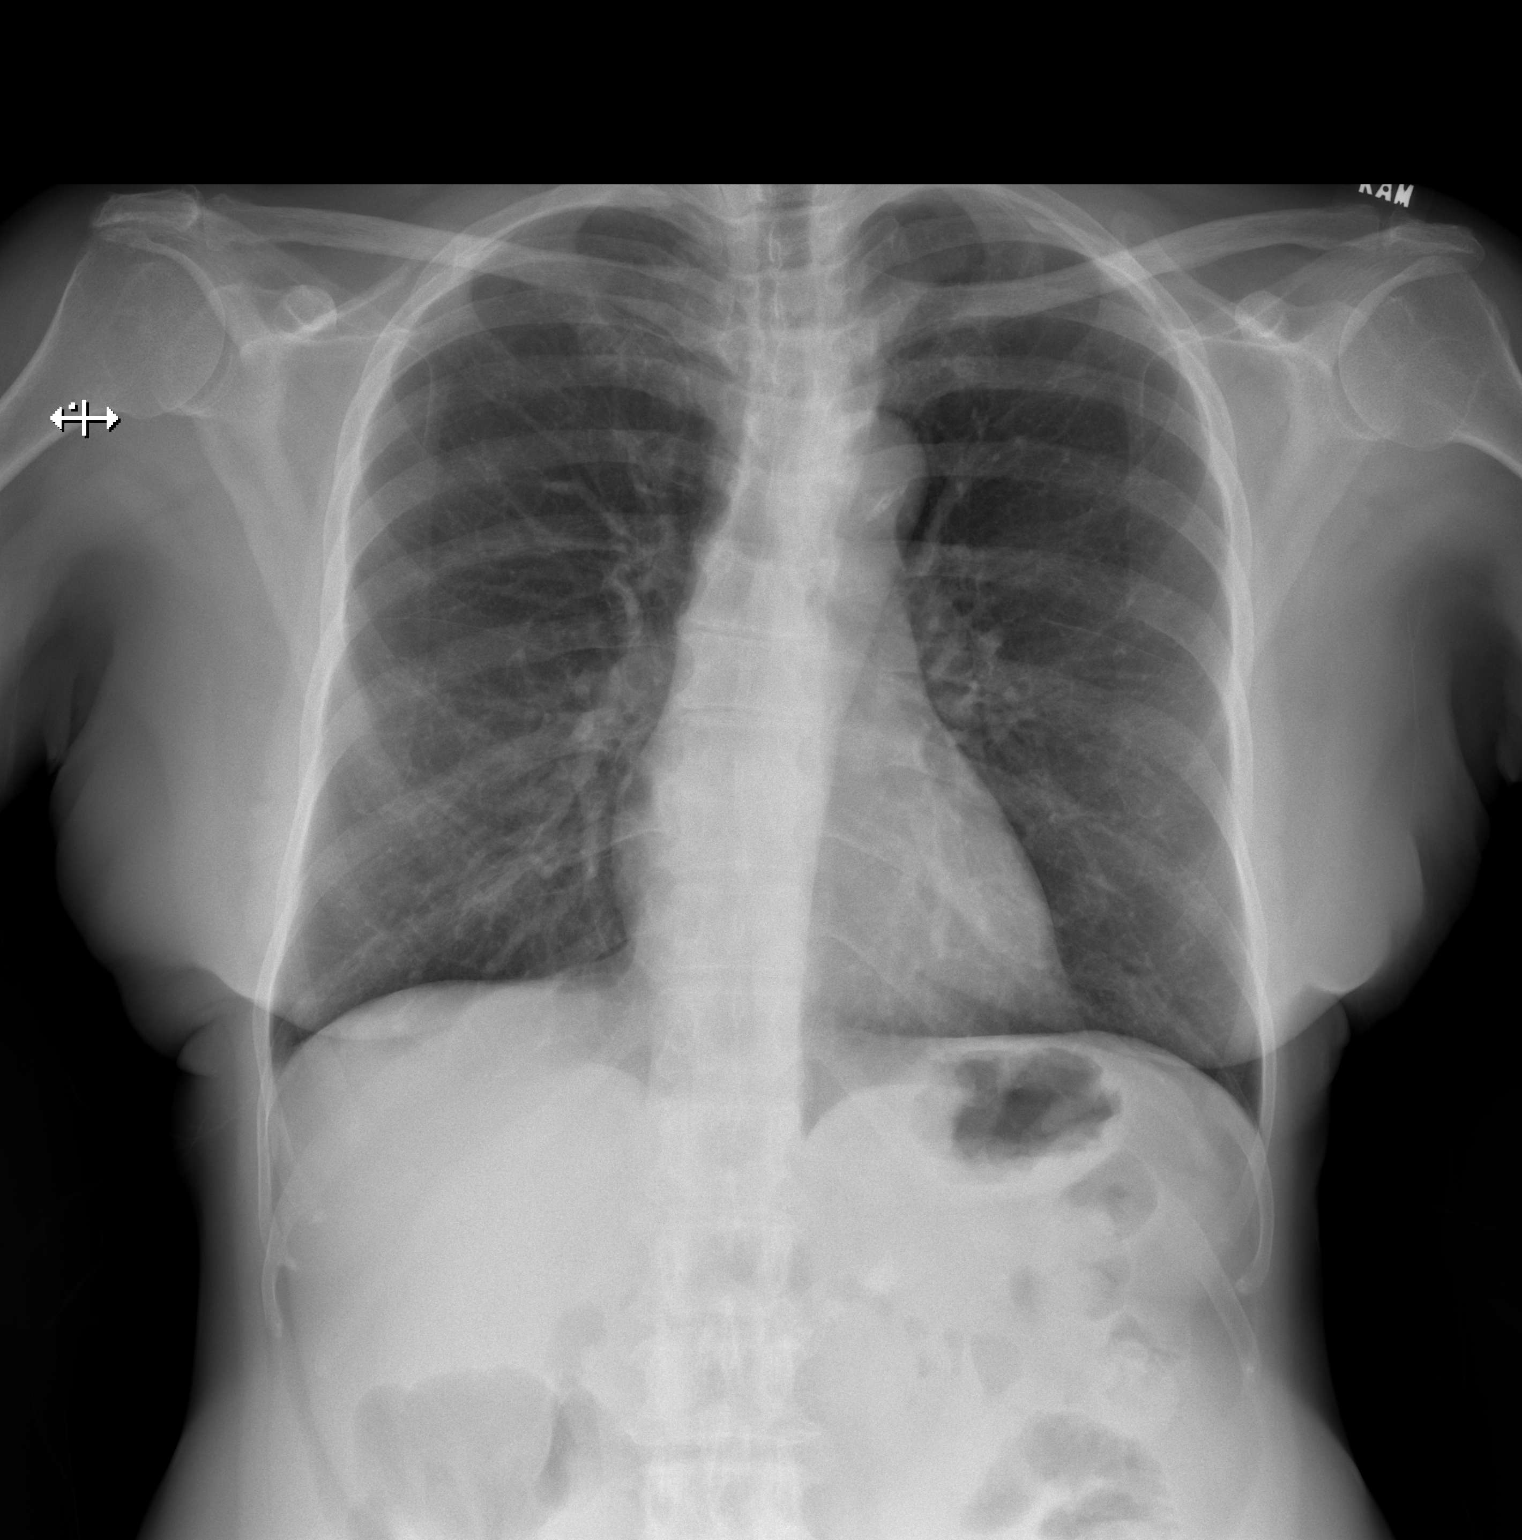

[w chest lat]
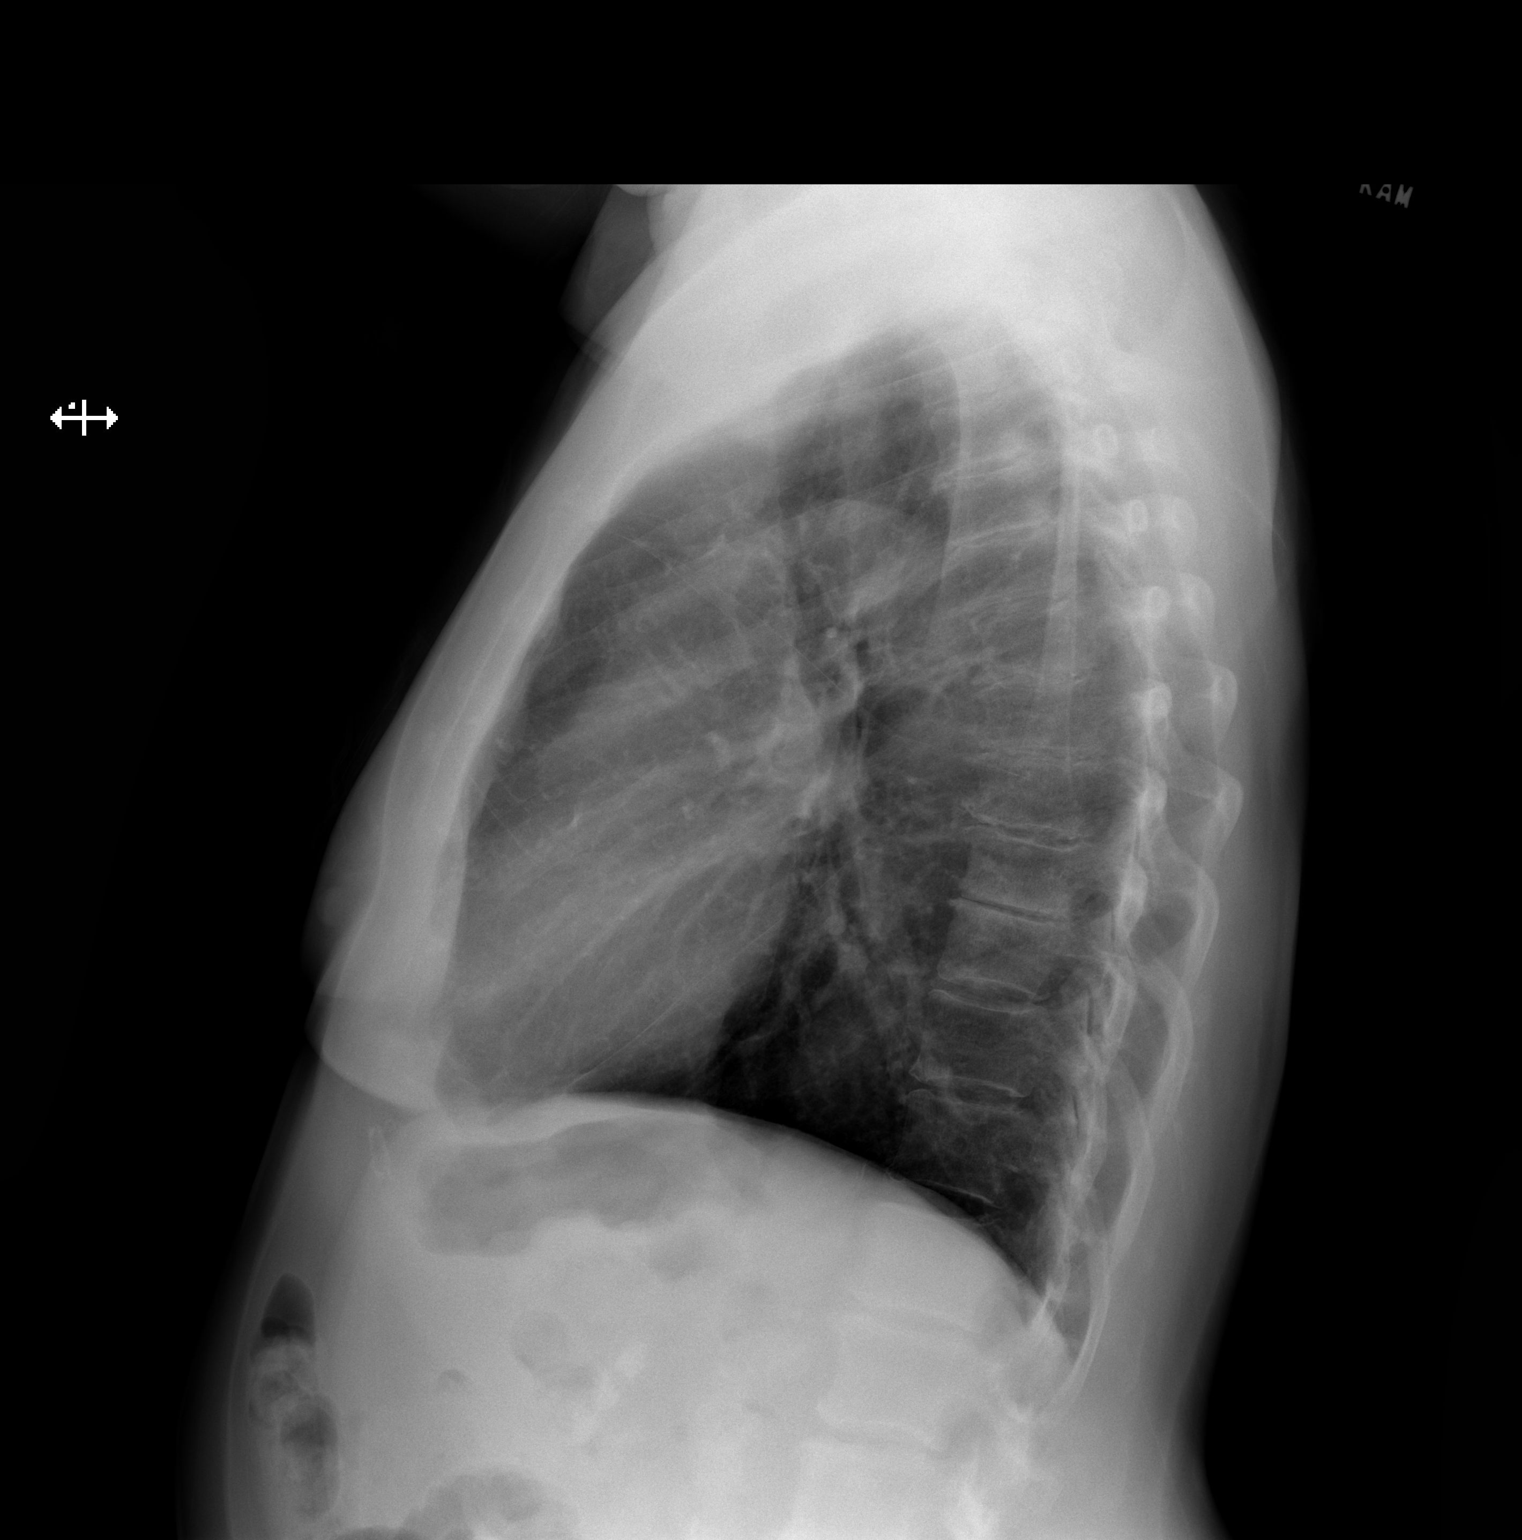

[2 of 2 positions shown; findings below may reference images not displayed]

FINDINGS: The cardiac silhouette is normal in size and configuration. Normal
mediastinal and hilar contours.

Clear lungs.  No pleural effusion or pneumothorax.

Skeletal structures are intact.
IMPRESSION: No active cardiopulmonary disease.

## 2022-07-04 ENCOUNTER — Telehealth: Payer: Self-pay

## 2022-07-04 ENCOUNTER — Other Ambulatory Visit: Payer: Self-pay

## 2022-07-04 ENCOUNTER — Emergency Department (HOSPITAL_COMMUNITY): Payer: Medicare PPO

## 2022-07-04 ENCOUNTER — Encounter (HOSPITAL_COMMUNITY): Payer: Self-pay | Admitting: Emergency Medicine

## 2022-07-04 ENCOUNTER — Emergency Department (HOSPITAL_COMMUNITY)
Admission: EM | Admit: 2022-07-04 | Discharge: 2022-07-04 | Disposition: A | Discharge status: Home / Self Care | Payer: Medicare PPO | Attending: Emergency Medicine | Admitting: Emergency Medicine

## 2022-07-04 DIAGNOSIS — E785 Hyperlipidemia, unspecified: Secondary | ICD-10-CM | POA: Insufficient documentation

## 2022-07-04 DIAGNOSIS — Z794 Long term (current) use of insulin: Secondary | ICD-10-CM | POA: Diagnosis not present

## 2022-07-04 DIAGNOSIS — E119 Type 2 diabetes mellitus without complications: Secondary | ICD-10-CM | POA: Insufficient documentation

## 2022-07-04 DIAGNOSIS — I498 Other specified cardiac arrhythmias: Secondary | ICD-10-CM | POA: Diagnosis not present

## 2022-07-04 DIAGNOSIS — I10 Essential (primary) hypertension: Secondary | ICD-10-CM | POA: Insufficient documentation

## 2022-07-04 DIAGNOSIS — J322 Chronic ethmoidal sinusitis: Secondary | ICD-10-CM | POA: Insufficient documentation

## 2022-07-04 DIAGNOSIS — Z79899 Other long term (current) drug therapy: Secondary | ICD-10-CM | POA: Insufficient documentation

## 2022-07-04 DIAGNOSIS — R42 Dizziness and giddiness: Secondary | ICD-10-CM | POA: Diagnosis not present

## 2022-07-04 LAB — CBC
HCT: 45.9 % (ref 36.0–46.0)
Hemoglobin: 15.1 g/dL — ABNORMAL HIGH (ref 12.0–15.0)
MCH: 29.2 pg (ref 26.0–34.0)
MCHC: 32.9 g/dL (ref 30.0–36.0)
MCV: 88.8 fL (ref 80.0–100.0)
Platelets: 165 10*3/uL (ref 150–400)
RBC: 5.17 MIL/uL — ABNORMAL HIGH (ref 3.87–5.11)
RDW: 13.6 % (ref 11.5–15.5)
WBC: 9.9 10*3/uL (ref 4.0–10.5)
nRBC: 0 % (ref 0.0–0.2)

## 2022-07-04 LAB — COMPREHENSIVE METABOLIC PANEL
ALT: 16 U/L (ref 0–44)
AST: 22 U/L (ref 15–41)
Albumin: 3.8 g/dL (ref 3.5–5.0)
Alkaline Phosphatase: 117 U/L (ref 38–126)
Anion gap: 11 (ref 5–15)
BUN: 9 mg/dL (ref 8–23)
CO2: 25 mmol/L (ref 22–32)
Calcium: 9.7 mg/dL (ref 8.9–10.3)
Chloride: 100 mmol/L (ref 98–111)
Creatinine, Ser: 0.65 mg/dL (ref 0.44–1.00)
GFR, Estimated: >60 mL/min (ref >60)
Glucose, Bld: 159 mg/dL — ABNORMAL HIGH (ref 70–99)
Potassium: 3.9 mmol/L (ref 3.5–5.1)
Sodium: 136 mmol/L (ref 135–145)
Total Bilirubin: 0.4 mg/dL (ref 0.3–1.2)
Total Protein: 7.8 g/dL (ref 6.5–8.1)

## 2022-07-04 LAB — DIFFERENTIAL
Abs Immature Granulocytes: 0.03 10*3/uL (ref 0.00–0.07)
Basophils Absolute: 0 10*3/uL (ref 0.0–0.1)
Basophils Relative: 0 %
Eosinophils Absolute: 0.1 10*3/uL (ref 0.0–0.5)
Eosinophils Relative: 1 %
Immature Granulocytes: 0 %
Lymphocytes Relative: 22 %
Lymphs Abs: 2.2 10*3/uL (ref 0.7–4.0)
Monocytes Absolute: 0.7 10*3/uL (ref 0.1–1.0)
Monocytes Relative: 7 %
Neutro Abs: 6.9 10*3/uL (ref 1.7–7.7)
Neutrophils Relative %: 70 %

## 2022-07-04 LAB — APTT: aPTT: 33 seconds (ref 24–36)

## 2022-07-04 LAB — PROTIME-INR
INR: 1 (ref 0.8–1.2)
Prothrombin Time: 13 seconds (ref 11.4–15.2)

## 2022-07-04 LAB — ETHANOL: Alcohol, Ethyl (B): <10 mg/dL (ref <10)

## 2022-07-04 MED ORDER — LOSARTAN POTASSIUM 25 MG PO TABS: 25.0000 mg | ORAL_TABLET | Freq: Every day | ORAL | 2 refills | Status: DC

## 2022-07-04 MED ORDER — LOSARTAN POTASSIUM 50 MG PO TABS
25.0000 mg | ORAL_TABLET | Freq: Once | ORAL | Status: AC
  Administered 2022-07-04: 25 mg via ORAL
  Filled 2022-07-04: qty 1

## 2022-07-04 MED ORDER — AMLODIPINE BESYLATE 5 MG PO TABS
5.0000 mg | ORAL_TABLET | Freq: Once | ORAL | Status: AC
  Administered 2022-07-04: 5 mg via ORAL
  Filled 2022-07-04: qty 1

## 2022-07-04 NOTE — ED Triage Notes (Signed)
Pt reports intermittent dizziness for 2 weeks. Also worried about BP, hypertensive in triage. Pt taken off losartan months ago. Reports recent falls but has not hit head.

## 2022-07-04 NOTE — Telephone Encounter (Signed)
Patient called and left message stating she was dizzy and her bp has been running over 190s over 100s- patient was advised due to her BP being so high and the dizziness she should go to hospital. Patient stated okay and she would go.

## 2022-07-04 NOTE — Discharge Instructions (Signed)
A prescription for 25 mg losartan was sent to your pharmacy.  Take this in addition to the amlodipine and continue to monitor your blood pressures at home.  Discuss further with your primary care doctor.  Return the emergency department for any new or worsening symptoms of concern.

## 2022-07-04 NOTE — ED Provider Notes (Signed)
[place] EMERGENCY DEPARTMENT AT [facility] Provider Note   CSN: [id number] Arrival date & time: 07/04/22  1302     History  Chief Complaint  Patient presents with   Dizziness   Hypertension    Martisha Toulouse is a 77 y.o. female.   Dizziness Hypertension  Patient presents for dizziness.  Medical history includes DM, HTN, HLD, anxiety, EtOH abuse.  Dizziness has been intermittent over the past 2 weeks.  She has a history of hypertension and was previously on amlodipine and losartan.  When she was seen by her PCP in March, her blood pressure was low at the time.  She was taken off of losartan.  She continues to take amlodipine daily but has not taken any today.  While at home this morning, she had an episode of dizziness.  She checked her blood pressure at the time and it was elevated in the range of 190/100.  She checked it several times and readings continue to be elevated other than 1 that read hypotensive.  Currently, she states that her dizziness has resolved.  She endorses some mild lightheadedness.  She denies any other current symptoms.      Home Medications Prior to Admission medications   Medication Sig Start Date End Date Taking? Authorizing Provider  amLODipine (NORVASC) 5 MG tablet TAKE ONE TABLET BY MOUTH BEFORE BREAKFAST 08/09/21   [name], FNP  atorvastatin (LIPITOR) 80 MG tablet TAKE ONE TABLET BY MOUTH AT BEDTIME 06/02/22   [name], FNP  Blood Glucose Monitoring Suppl DEVI Check blood sugars twice daily. E11.9 10/03/18   [name], FNP  dicyclomine (BENTYL) 20 MG tablet Take 1 tablet (20 mg total) by mouth 2 (two) times daily. 07/17/21   Kommor, Madison, MD  empagliflozin (JARDIANCE) 25 MG TABS tablet Take 1 tablet (25 mg total) by mouth daily before breakfast. 02/02/22   Shamleffer, Konrad Dolores, MD  HYDROcodone-acetaminophen (NORCO/VICODIN) 5-325 MG tablet Take 2 tablets by mouth every 4 (four) hours as needed. 07/17/21   Kommor,  Madison, MD  Injection Device for Insulin (CEQUR SIMPLICITY INSERTER) MISC 1 Device by Does not apply route daily in the afternoon. 11/09/21   Shamleffer, Konrad Dolores, MD  insulin glargine (LANTUS SOLOSTAR) 100 UNIT/ML Solostar Pen Inject 40 Units into the skin daily. Patient not taking: Reported on 03/13/2022 02/01/22   Shamleffer, Konrad Dolores, MD  Insulin Pen Needle 32G X 6 MM MISC 1 each by Does not apply route daily in the afternoon. 02/01/22   Shamleffer, Konrad Dolores, MD  lidocaine (LIDODERM) 5 % Place 1 patch onto the skin daily. Remove & Discard patch within 12 hours or as directed by MD 07/17/21   Kommor, Wyn Forster, MD  losartan (COZAAR) 50 MG tablet Take 1 tablet (50 mg total) by mouth daily. 08/09/21   [name], FNP  magnesium 30 MG tablet Take 30 mg by mouth once a week.    [provider]  Multiple Vitamins-Minerals (MULTIVITAMIN WITH MINERALS) tablet Take 1 tablet by mouth daily.    [provider]  pioglitazone (ACTOS) 30 MG tablet Take 1 tablet (30 mg total) by mouth daily. 02/01/22   Shamleffer, Konrad Dolores, MD  potassium chloride (KLOR-CON M) 10 MEQ tablet TAKE ONE TABLET BY MOUTH EVERY MORNING 01/27/22   [name], FNP  VASCEPA 1 g capsule TAKE 2 CAPSULES(2 GRAMS) BY MOUTH TWICE DAILY 03/17/21   Hilty, Lisette Abu, MD      Allergies    Patient has no known  allergies.    Review of Systems   Review of Systems  Neurological:  Positive for dizziness and light-headedness.  All other systems reviewed and are negative.   Physical Exam Updated Vital Signs BP (!) 165/71   Pulse 74   Temp 98 F (36.7 C)   Resp 16   Ht 5\' 3"  (1.6 m)   Wt 54 kg   SpO2 100%   BMI 21.09 kg/m  Physical Exam Vitals and nursing note reviewed.  Constitutional:      General: She is not in acute distress.    Appearance: Normal appearance. She is well-developed. She is not ill-appearing, toxic-appearing or diaphoretic.  HENT:     Head: Normocephalic and atraumatic.      Right Ear: External ear normal.     Left Ear: External ear normal.     Nose: Nose normal.     Mouth/Throat:     Mouth: Mucous membranes are moist.  Eyes:     Extraocular Movements: Extraocular movements intact.     Conjunctiva/sclera: Conjunctivae normal.  Cardiovascular:     Rate and Rhythm: Normal rate and regular rhythm.  Pulmonary:     Effort: Pulmonary effort is normal. No respiratory distress.     Breath sounds: No wheezing or rales.  Abdominal:     General: There is no distension.     Palpations: Abdomen is soft.     Tenderness: There is no abdominal tenderness.  Musculoskeletal:        General: No swelling. Normal range of motion.     Cervical back: Normal range of motion and neck supple.     Right lower leg: No edema.     Left lower leg: No edema.  Skin:    General: Skin is warm and dry.     Capillary Refill: Capillary refill takes less than 2 seconds.     Coloration: Skin is not jaundiced or pale.  Neurological:     General: No focal deficit present.     Mental Status: She is alert and oriented to person, place, and time.     Cranial Nerves: No cranial nerve deficit.     Sensory: No sensory deficit.     Motor: No weakness.     Coordination: Coordination normal.  Psychiatric:        Mood and Affect: Mood normal.        Behavior: Behavior normal.        Thought Content: Thought content normal.        Judgment: Judgment normal.     ED Results / Procedures / Treatments   Labs (all labs ordered are listed, but only abnormal results are displayed) Labs Reviewed  CBC - Abnormal; Notable for the following components:      Result Value   RBC 5.17 (*)    Hemoglobin 15.1 (*)    All other components within normal limits  COMPREHENSIVE METABOLIC PANEL - Abnormal; Notable for the following components:   Glucose, Bld 159 (*)    All other components within normal limits  PROTIME-INR  APTT  DIFFERENTIAL  ETHANOL  CBG MONITORING, ED    EKG None  Radiology CT  HEAD WO CONTRAST  Result Date: 07/04/2022 CLINICAL DATA:  Dizziness, vertigo EXAM: CT HEAD WITHOUT CONTRAST TECHNIQUE: Contiguous axial images were obtained from the base of the skull through the vertex without intravenous contrast. RADIATION DOSE REDUCTION: This exam was performed according to the departmental dose-optimization program which includes automated exposure control, adjustment of the mA  and/or kV according to patient size and/or use of iterative reconstruction technique. COMPARISON:  None Available. FINDINGS: Brain: No acute intracranial findings are seen. There are no signs of bleeding within the cranium. There is no focal edema or mass effect. Ventricles are not dilated. Cortical sulci are prominent. Vascular: Unremarkable. Skull: No acute findings are seen. Sinuses/Orbits: There is mucosal thickening in ethmoid sinus. Other: None. IMPRESSION: No acute intracranial findings are seen in noncontrast CT brain. Mild chronic ethmoid sinusitis. Electronically Signed   By: Ernie Avena M.D.   On: 07/04/2022 14:06    Procedures Procedures    Medications Ordered in ED Medications  amLODipine (NORVASC) tablet 5 mg (5 mg Oral Given 07/04/22 1453)  losartan (COZAAR) tablet 25 mg (25 mg Oral Given 07/04/22 1453)    ED Course/ Medical Decision Making/ A&P                             Medical Decision Making Amount and/or Complexity of Data Reviewed Labs: ordered. Radiology: ordered.  Risk Prescription drug management.   This patient presents to the ED for concern of dizziness, this involves an extensive number of treatment options, and is a complaint that carries with it a high risk of complications and morbidity.  The differential diagnosis includes symptomatic hypertension, polypharmacy, dehydration, arrhythmia, ICH, CVA, vestibular neuritis, BPPV   Co morbidities that complicate the patient evaluation  DM, HTN, HLD, anxiety, EtOH abuse   Additional history  obtained:  Additional history obtained from N/A External records from outside source obtained and reviewed including EMR   Lab Tests:  I Ordered, and personally interpreted labs.  The pertinent results include: Normal hemoglobin, no leukocytosis, normal kidney function, normal electrolytes   Imaging Studies ordered:  I ordered imaging studies including amlodipine and losartan I independently visualized and interpreted imaging which showed hypertension I agree with the radiologist interpretation   Cardiac Monitoring: / EKG:  The patient was maintained on a cardiac monitor.  I personally viewed and interpreted the cardiac monitored which showed an underlying rhythm of: Sinus rhythm  Problem List / ED Course / Critical interventions / Medication management  Patient presents for intermittent dizziness over the past 2 weeks.  She had an episode this morning which prompted her to check her blood pressure at home.  When she did, blood pressure was elevated in the range of 190/100.  On arrival in the ED, blood pressure continues to be elevated.  After being bedded in the ED, blood pressure mildly improved to SBP of 185.  She has not taken her amlodipine today.  She was taken off of her losartan 3 months ago.  She notices some mild lightheadedness at this time.  Other than her hypertension, vital signs are reassuring.  Patient is well-appearing on exam.  She has no focal neurologic deficits.  Workup was initiated to assess for endorgan damage.  Results of workup, including CT head are reassuring.  Patient was given her home amlodipine as well as losartan, which was dosed at half dose of which she was on before.  She had further improvement in her blood pressure.  She was able to stand and ambulate without any symptoms.  She was able to ambulate to the bathroom with steady gait.  She has been comfortable with discharge home.  Plan will be for reinitiation of losartan at 25 mg daily.  Patient to  follow-up with her primary care doctor for ongoing management of her  medications.  She was discharged in good condition. I ordered medication including amlodipine and losartan for hypertension Reevaluation of the patient after these medicines showed that the patient improved I have reviewed the patients home medicines and have made adjustments as needed   Social Determinants of Health:  Has PCP        Final Clinical Impression(s) / ED Diagnoses Final diagnoses:  Dizziness  Hypertension, unspecified type    Rx / DC Orders ED Discharge Orders     None         Gloris Manchester, MD 07/04/22 1553

## 2022-07-04 NOTE — ED Notes (Signed)
Patient transported to CT 

## 2022-07-06 IMAGING — CT CT ABD-PELV W/O CM
2 of 3 series · 14 of 32 positions shown, 19 images · non-contrast
Comparison: None.

CLINICAL DATA: Acute abdominal pain.

Right-sided rib and upper abdominal pain for 1 month.
Worsening intensity.
EXAM:
CT ABDOMEN AND PELVIS WITHOUT CONTRAST
TECHNIQUE: Multidetector CT imaging of the abdomen and pelvis was performed
following the standard protocol without IV contrast.

[Series 2: abd/pelvis w/(date) · axial · 0.76mm/px · z∈[-217,+83]mm · 8 of 78 slices shown, 13 images]
[im 9/78  soft-tissue]
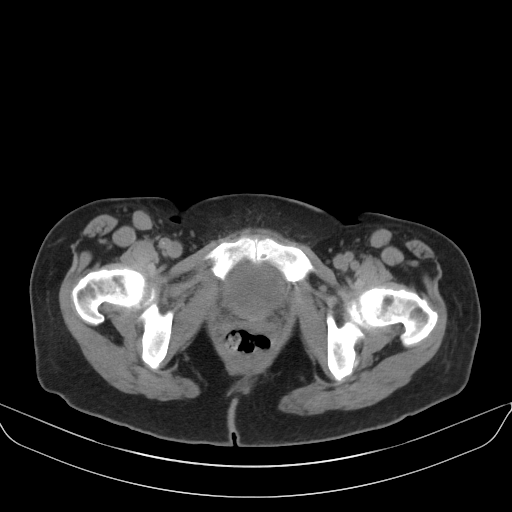
[im 9/78  bone]
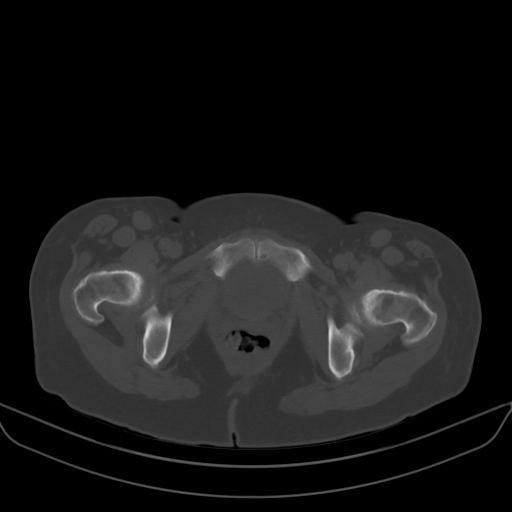
[im 18/78  soft-tissue]
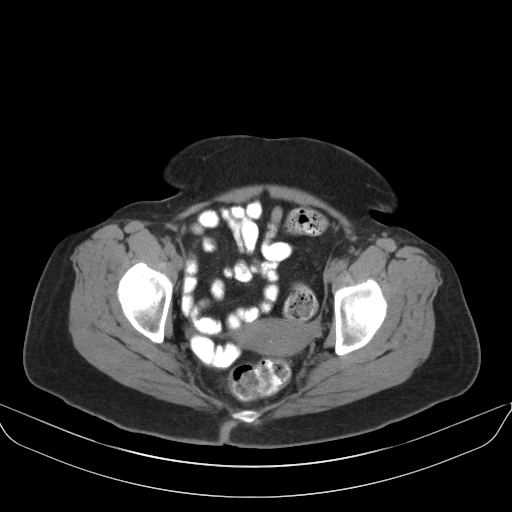
[im 26/78  soft-tissue]
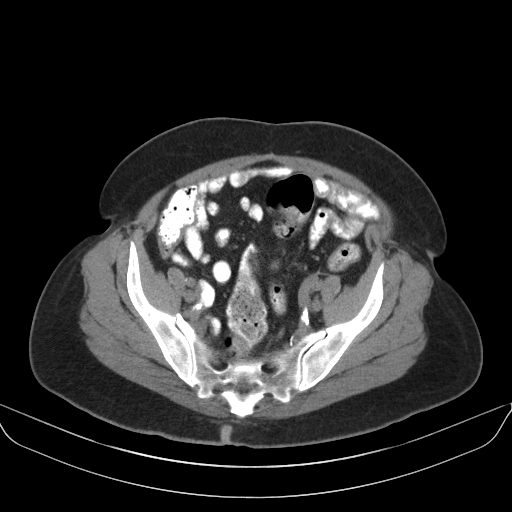
[im 35/78  soft-tissue]
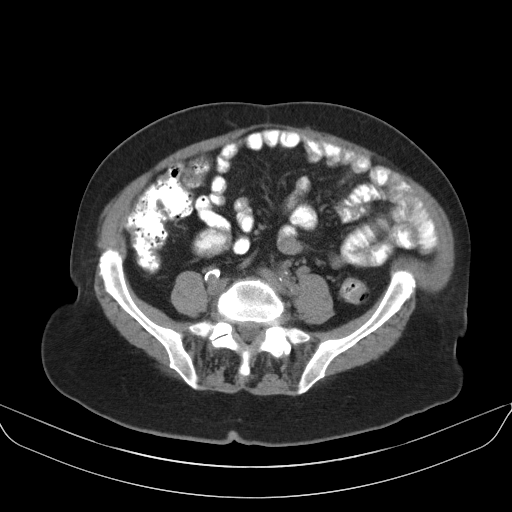
[im 43/78  soft-tissue]
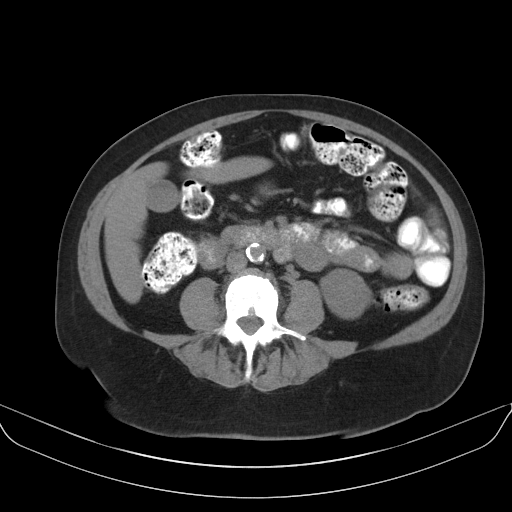
[im 43/78  lung]
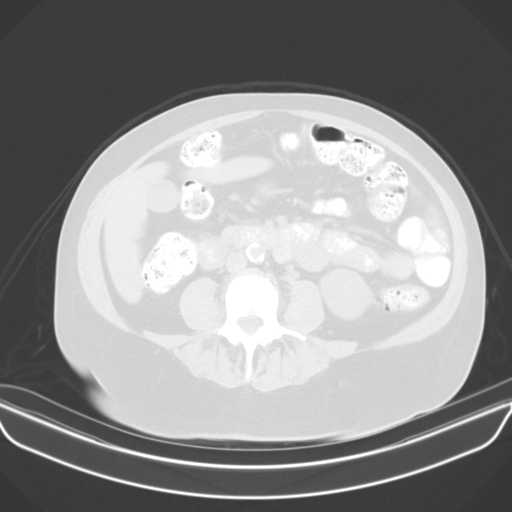
[im 52/78  soft-tissue]
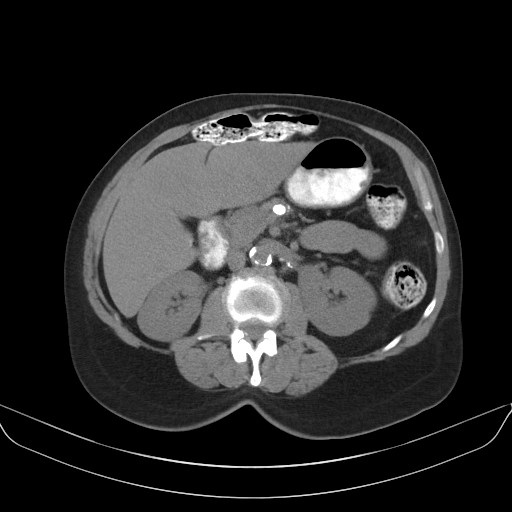
[im 52/78  lung]
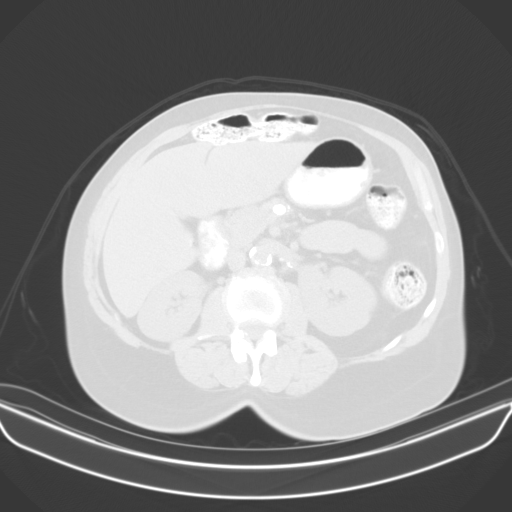
[im 60/78  soft-tissue]
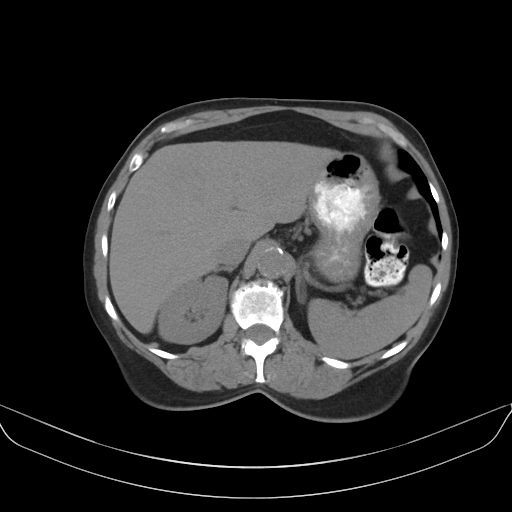
[im 60/78  lung]
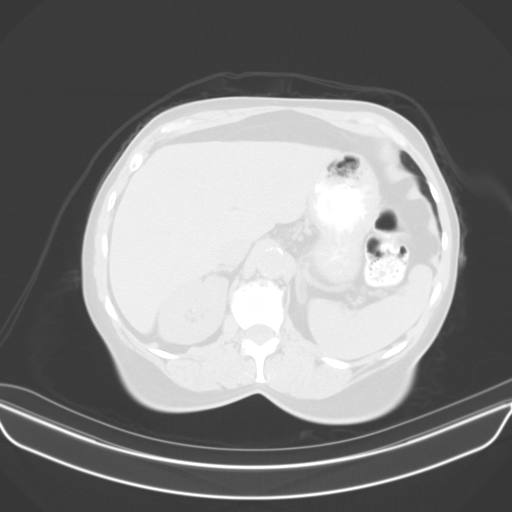
[im 69/78  soft-tissue]
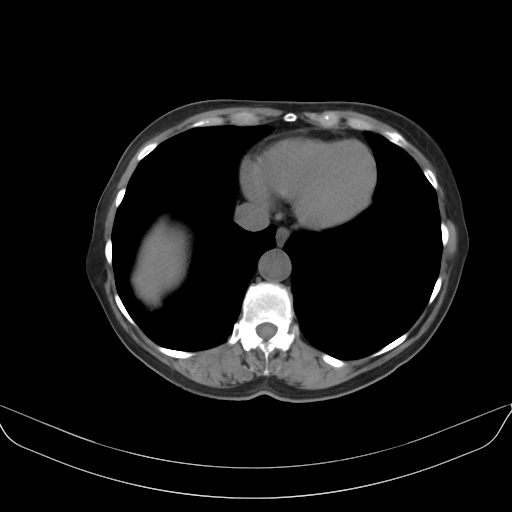
[im 69/78  lung]
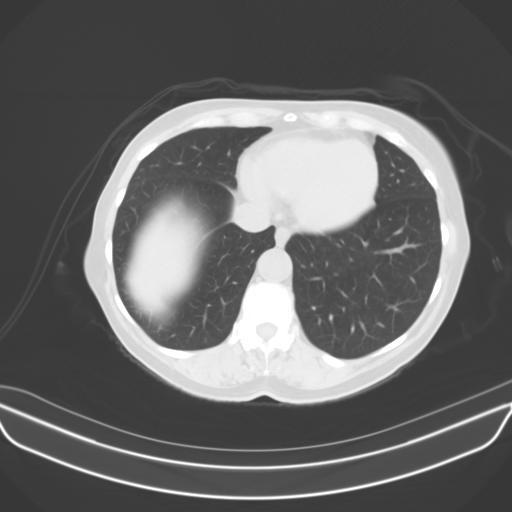

[Series 6: bone window · axial · 0.76mm/px · z∈[-54,+54]mm · 6 of 101 slices shown]
[im 10/101  bone]
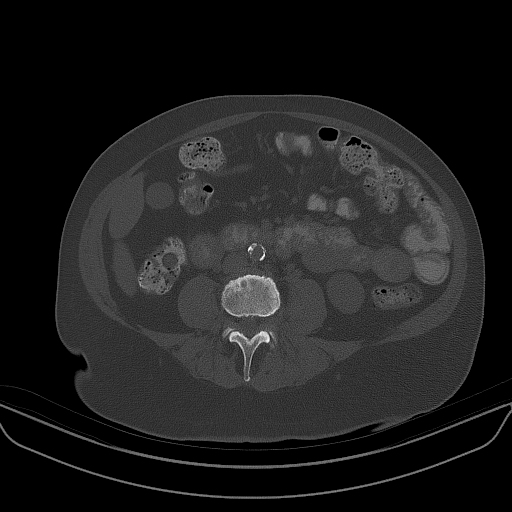
[im 19/101  bone]
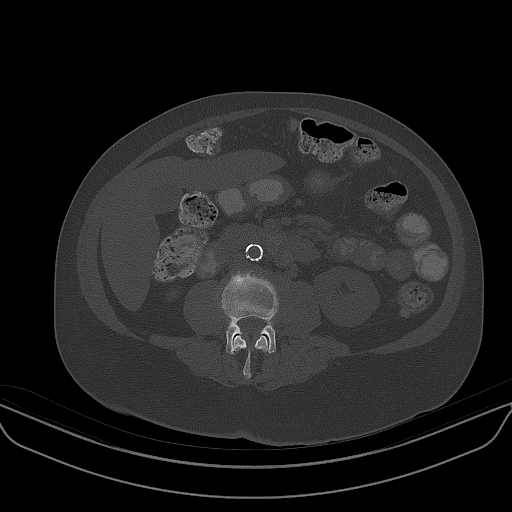
[im 37/101  bone]
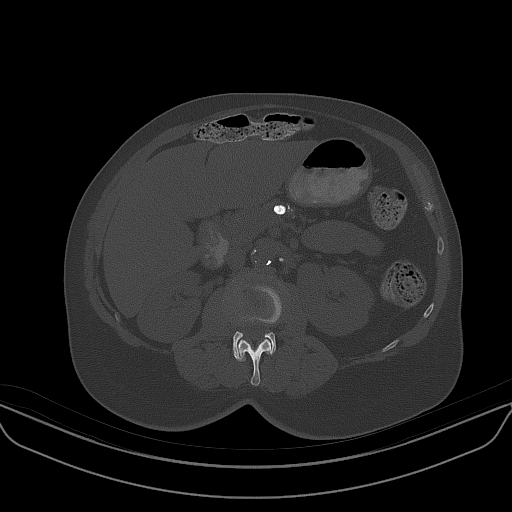
[im 46/101  bone]
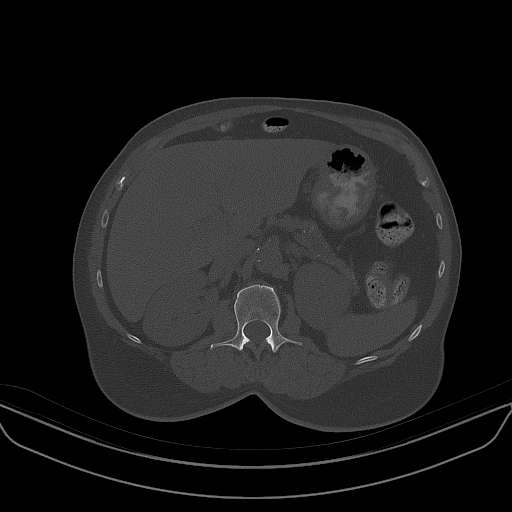
[im 55/101  bone]
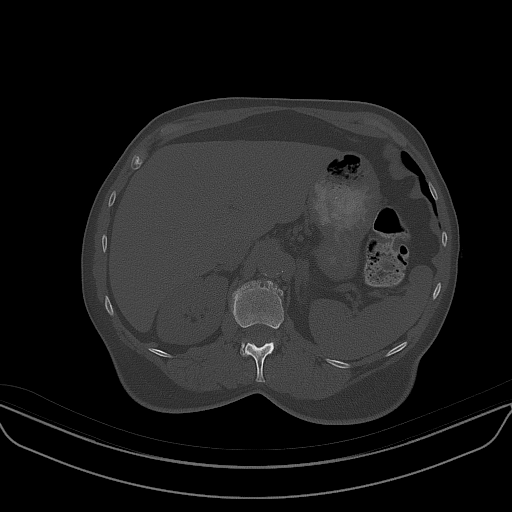
[im 64/101  bone]
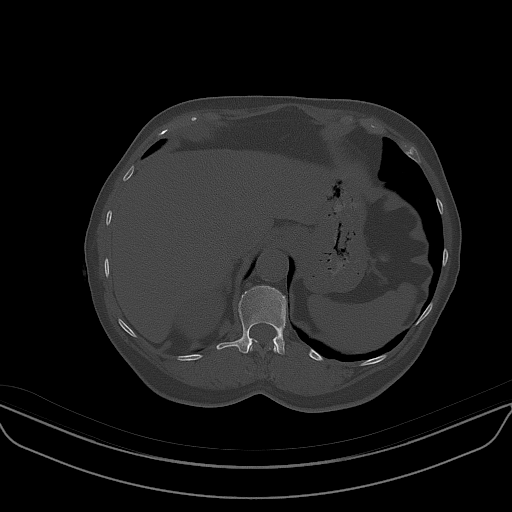

[14 of 32 positions shown; findings below may reference images not displayed]

FINDINGS: Lower chest: No acute abnormality.

Hepatobiliary: No focal liver abnormality is seen. No gallstones,
gallbladder wall thickening, or biliary dilatation.

Pancreas: Pancreatic parenchymal calcifications consistent with
prior episodes of pancreatitis. Multiple rounded calcifications in
the region of the pancreatic duct suspicious for ductal calculi.
There is mild dilatation of the distal pancreatic duct measuring up
to 7-8 mm.

Spleen: Normal in size without focal abnormality.

Adrenals/Urinary Tract: Adrenal glands are unremarkable. Kidneys are
normal, without renal calculi, focal lesion, or hydronephrosis.
Bladder is unremarkable.

Stomach/Bowel: Mild distal colonic diverticulosis without evidence
of acute diverticulitis. No bowel dilatation acute ileus or
obstruction. Appendix not identified.

Vascular/Lymphatic: Diffuse atherosclerotic changes of the abdominal
aorta and branches. No enlarged abdominal or pelvic lymph nodes.

Reproductive: Uterus and bilateral adnexa are unremarkable.

Other: No abdominal wall hernia or abnormality. No abdominopelvic
ascites.

Musculoskeletal: Focal advanced degenerative changes seen at L4-L5.
IMPRESSION: 1. No acute abnormality of the abdomen or pelvis.
2. Pancreatic parenchymal calcifications consistent with prior
episodes of pancreatitis. Multiple calculi noted within the
pancreatic duct measuring up to 8 mm in diameter. The pancreatic
duct within the tail is dilated measuring up to 8 mm.
3. Distal colonic diverticulosis without evidence of acute
diverticulitis.

## 2022-07-17 ENCOUNTER — Ambulatory Visit: Payer: Medicare PPO | Admitting: Nurse Practitioner

## 2022-08-02 ENCOUNTER — Ambulatory Visit: Payer: Medicare PPO | Admitting: Nurse Practitioner

## 2022-08-02 NOTE — Progress Notes (Deleted)
Gabriella Acosta, CMA,acting as a Neurosurgeon for Gabriella Felts, FNP.,have documented all relevant documentation on the behalf of Gabriella Felts, FNP,as directed by  Gabriella Felts, FNP while in the presence of Gabriella Felts, FNP.  Subjective:  Patient ID: Gabriella Acosta , female    DOB: 1945-06-01 , 77 y.o.   MRN: [id number]  No chief complaint on file.   HPI  Patient presents today for a DM, BP, Chol follow up, patient reports compliance with medications. Patient denies any chest pains, SOB, or headaches. Patient has no other concerns today.     Past Medical History:  Diagnosis Date  . Diabetes mellitus without complication (HCC)   . Hypertension   . Type 2 diabetes mellitus (HCC) 07/22/2020     Family History  Problem Relation Age of Onset  . Diabetes Mother   . Diabetes Father   . Heart disease Father   . Breast cancer Neg Hx      Current Outpatient Medications:  .  amLODipine (NORVASC) 5 MG tablet, TAKE ONE TABLET BY MOUTH BEFORE BREAKFAST, Disp: 90 tablet, Rfl: 2 .  atorvastatin (LIPITOR) 80 MG tablet, TAKE ONE TABLET BY MOUTH AT BEDTIME, Disp: 90 tablet, Rfl: 1 .  Blood Glucose Monitoring Suppl DEVI, Check blood sugars twice daily. E11.9, Disp: 1 kit, Rfl: 0 .  dicyclomine (BENTYL) 20 MG tablet, Take 1 tablet (20 mg total) by mouth 2 (two) times daily., Disp: 20 tablet, Rfl: 0 .  empagliflozin (JARDIANCE) 25 MG TABS tablet, Take 1 tablet (25 mg total) by mouth daily before breakfast., Disp: 90 tablet, Rfl: 3 .  HYDROcodone-acetaminophen (NORCO/VICODIN) 5-325 MG tablet, Take 2 tablets by mouth every 4 (four) hours as needed., Disp: 10 tablet, Rfl: 0 .  Injection Device for Insulin (CEQUR SIMPLICITY INSERTER) MISC, 1 Device by Does not apply route daily in the afternoon., Disp: 1 each, Rfl: 0 .  insulin glargine (LANTUS SOLOSTAR) 100 UNIT/ML Solostar Pen, Inject 40 Units into the skin daily. (Patient not taking: Reported on 03/13/2022), Disp: 45 mL, Rfl: 3 .  Insulin Pen Needle 32G X  6 MM MISC, 1 each by Does not apply route daily in the afternoon., Disp: 100 each, Rfl: 3 .  lidocaine (LIDODERM) 5 %, Place 1 patch onto the skin daily. Remove & Discard patch within 12 hours or as directed by MD, Disp: 30 patch, Rfl: 0 .  losartan (COZAAR) 25 MG tablet, Take 1 tablet (25 mg total) by mouth daily., Disp: 30 tablet, Rfl: 2 .  magnesium 30 MG tablet, Take 30 mg by mouth once a week., Disp: , Rfl:  .  Multiple Vitamins-Minerals (MULTIVITAMIN WITH MINERALS) tablet, Take 1 tablet by mouth daily., Disp: , Rfl:  .  pioglitazone (ACTOS) 30 MG tablet, Take 1 tablet (30 mg total) by mouth daily., Disp: 90 tablet, Rfl: 3 .  potassium chloride (KLOR-CON M) 10 MEQ tablet, TAKE ONE TABLET BY MOUTH EVERY MORNING, Disp: 90 tablet, Rfl: 1 .  VASCEPA 1 g capsule, TAKE 2 CAPSULES(2 GRAMS) BY MOUTH TWICE DAILY, Disp: 120 capsule, Rfl: 3   No Known Allergies   Review of Systems   There were no vitals filed for this visit. There is no height or weight on file to calculate BMI.  Wt Readings from Last 3 Encounters:  07/04/22 119 lb 0.8 oz (54 kg)  03/13/22 118 lb (53.5 kg)  02/01/22 119 lb (54 kg)    The 10-year ASCVD risk score (Arnett DK, et al., 2019) is: 65.8%  Values used to calculate the score:     Age: 36 years     Sex: Female     Is Non-Hispanic African American: Yes     Diabetic: Yes     Tobacco smoker: Yes     Systolic Blood Pressure: 165 mmHg     Is BP treated: Yes     HDL Cholesterol: 60 mg/dL     Total Cholesterol: 176 mg/dL  Objective:  Physical Exam      Assessment And Plan:  Uncontrolled type 2 diabetes mellitus with hyperglycemia (HCC)  Essential hypertension  Mixed hyperlipidemia  ETOH abuse    No follow-ups on file.  Patient was given opportunity to ask questions. Patient verbalized understanding of the plan and was able to repeat key elements of the plan. All questions were answered to their satisfaction.    Jeanell Sparrow, FNP, have reviewed all  documentation for this visit. The documentation on 08/02/22 for the exam, diagnosis, procedures, and orders are all accurate and complete.   IF YOU HAVE BEEN REFERRED TO A SPECIALIST, IT MAY TAKE 1-2 WEEKS TO SCHEDULE/PROCESS THE REFERRAL. IF YOU HAVE NOT HEARD FROM US/SPECIALIST IN TWO WEEKS, PLEASE GIVE [place] A CALL AT [phone].

## 2022-08-09 ENCOUNTER — Ambulatory Visit: Payer: Medicare PPO | Admitting: Internal Medicine

## 2022-09-16 ENCOUNTER — Other Ambulatory Visit: Payer: Self-pay | Admitting: Nurse Practitioner

## 2022-09-25 ENCOUNTER — Other Ambulatory Visit: Payer: Self-pay

## 2022-09-25 MED ORDER — AMLODIPINE BESYLATE 5 MG PO TABS: ORAL_TABLET | ORAL | 2 refills | Status: DC

## 2022-10-18 ENCOUNTER — Inpatient Hospital Stay: Admission: RE | Admit: 2022-10-18 | Payer: Medicare PPO | Source: Ambulatory Visit

## 2022-10-25 ENCOUNTER — Ambulatory Visit: Payer: Medicare PPO

## 2022-10-25 VITALS — BP 118/60 | HR 78 | Temp 97.8°F | Ht 62.6 in | Wt 133.8 lb

## 2022-10-25 DIAGNOSIS — Z0000 Encounter for general adult medical examination without abnormal findings: Secondary | ICD-10-CM | POA: Diagnosis not present

## 2022-10-25 DIAGNOSIS — Z23 Encounter for immunization: Secondary | ICD-10-CM | POA: Diagnosis not present

## 2022-10-25 NOTE — Patient Instructions (Signed)
Gabriella Acosta , Thank you for taking time to come for your Medicare Wellness Visit. I appreciate your ongoing commitment to your health goals. Please review the following plan we discussed and let me know if I can assist you in the future.   Referrals/Orders/Follow-Ups/Clinician Recommendations: none  This is a list of the screening recommended for you and due dates:  Health Maintenance  Topic Date Due   Pneumonia Vaccine (1 of 2 - PCV) Never done   Colon Cancer Screening  Never done   DEXA scan (bone density measurement)  Never done   Eye exam for diabetics  10/01/2020   Yearly kidney health urinalysis for diabetes  12/09/2021   Zoster (Shingles) Vaccine (2 of 2) 12/13/2021   Hemoglobin A1C  08/02/2022   COVID-19 Vaccine (6 - 2023-24 season) 09/10/2022   Complete foot exam   02/02/2023   Yearly kidney function blood test for diabetes  07/04/2023   DTaP/Tdap/Td vaccine (3 - Td or Tdap) 07/30/2023   Medicare Annual Wellness Visit  10/25/2023   Flu Shot  Completed   Hepatitis C Screening  Completed   HPV Vaccine  Aged Out    Advanced directives: (Provided) Advance directive discussed with you today. I have provided a copy for you to complete at home and have notarized. Once this is complete, please bring a copy in to our office so we can scan it into your chart.   Next Medicare Annual Wellness Visit scheduled for next year: No, office will schedule appointment  Insert Preventive Care attachment Insert FALL PREVENTION attachment if needed
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
## 2022-11-09 ENCOUNTER — Ambulatory Visit: Payer: Medicare PPO | Admitting: Nurse Practitioner

## 2022-11-09 ENCOUNTER — Encounter: Payer: Self-pay | Admitting: Nurse Practitioner

## 2022-11-09 VITALS — BP 132/64 | HR 77 | Temp 99.0°F | Ht 62.0 in | Wt 133.6 lb

## 2022-11-09 DIAGNOSIS — Z2821 Immunization not carried out because of patient refusal: Secondary | ICD-10-CM

## 2022-11-09 DIAGNOSIS — Z1211 Encounter for screening for malignant neoplasm of colon: Secondary | ICD-10-CM

## 2022-11-09 DIAGNOSIS — I10 Essential (primary) hypertension: Secondary | ICD-10-CM | POA: Diagnosis not present

## 2022-11-09 DIAGNOSIS — E2839 Other primary ovarian failure: Secondary | ICD-10-CM

## 2022-11-09 DIAGNOSIS — E1165 Type 2 diabetes mellitus with hyperglycemia: Secondary | ICD-10-CM

## 2022-11-09 DIAGNOSIS — E782 Mixed hyperlipidemia: Secondary | ICD-10-CM

## 2022-11-09 MED ORDER — LANTUS SOLOSTAR 100 UNIT/ML ~~LOC~~ SOPN
32.0000 [IU] | PEN_INJECTOR | Freq: Every day | SUBCUTANEOUS | Status: DC
Start: 2022-11-09 — End: 2022-11-22

## 2022-11-09 NOTE — Progress Notes (Signed)
Gabriella Acosta, CMA,acting as a Neurosurgeon for Gabriella Felts, FNP.,have documented all relevant documentation on the behalf of Gabriella Felts, FNP,as directed by  Gabriella Felts, FNP while in the presence of Gabriella Felts, FNP.  Subjective:  Patient ID: Gabriella Acosta , female    DOB: [birth year] , 77 y.o.   MRN: [id number]  Chief Complaint  Patient presents with   Hypertension   Diabetes    HPI  Patient presents today for a bp and dm follow up, Patient reports compliance with medication. Patient denies any chest pain, SOB, or headaches. Patient has no concerns today. She has her insulin decreased to 32 units (lantus) has not seen Dr. Antonieta Acosta since January. She is scheduled for her eye exam on Thursday  BP Readings from Last 3 Encounters: 11/09/22 : 132/64 10/25/22 : 118/60 07/04/22 : (!) 165/71      The 10-year ASCVD risk score (Arnett DK, et al., 2019) is: 53.9%   Values used to calculate the score:     Age: 67 years     Sex: Female     Is Non-Hispanic African American: Yes     Diabetic: Yes     Tobacco smoker: Yes     Systolic Blood Pressure: 132 mmHg     Is BP treated: Yes     HDL Cholesterol: 69 mg/dL     Total Cholesterol: 168 mg/dL  Past Medical History:  Diagnosis Date   Diabetes mellitus without complication (HCC)    Hypertension    Type 2 diabetes mellitus (HCC) 07/22/2020     Family History  Problem Relation Age of Onset   Diabetes Mother    Diabetes Father    Heart disease Father    Breast cancer Neg Hx      Current Outpatient Medications:    amLODipine (NORVASC) 5 MG tablet, TAKE ONE TABLET BY MOUTH BEFORE BREAKFAST, Disp: 90 tablet, Rfl: 2   atorvastatin (LIPITOR) 80 MG tablet, TAKE ONE TABLET BY MOUTH AT BEDTIME, Disp: 90 tablet, Rfl: 1   Blood Glucose Monitoring Suppl DEVI, Check blood sugars twice daily. E11.9, Disp: 1 kit, Rfl: 0   empagliflozin (JARDIANCE) 25 MG TABS tablet, Take 1 tablet (25 mg total) by mouth daily before breakfast., Disp: 90  tablet, Rfl: 3   Injection Device for Insulin (CEQUR SIMPLICITY INSERTER) MISC, 1 Device by Does not apply route daily in the afternoon., Disp: 1 each, Rfl: 0   Insulin Pen Needle 32G X 6 MM MISC, 1 each by Does not apply route daily in the afternoon., Disp: 100 each, Rfl: 3   magnesium 30 MG tablet, Take 30 mg by mouth once a week., Disp: , Rfl:    Multiple Vitamins-Minerals (MULTIVITAMIN WITH MINERALS) tablet, Take 1 tablet by mouth daily., Disp: , Rfl:    pioglitazone (ACTOS) 30 MG tablet, Take 1 tablet (30 mg total) by mouth daily., Disp: 90 tablet, Rfl: 3   potassium chloride (KLOR-CON M) 10 MEQ tablet, TAKE 1 TABLET BY MOUTH EVERY MORNING, Disp: 30 tablet, Rfl: 1   insulin glargine (LANTUS SOLOSTAR) 100 UNIT/ML Solostar Pen, Inject 32 Units into the skin daily., Disp: , Rfl:    losartan (COZAAR) 25 MG tablet, Take 1 tablet (25 mg total) by mouth daily., Disp: 30 tablet, Rfl: 2   VASCEPA 1 g capsule, TAKE 2 CAPSULES(2 GRAMS) BY MOUTH TWICE DAILY (Patient not taking: Reported on 10/25/2022), Disp: 120 capsule, Rfl: 3   No Known Allergies   Review of Systems  Constitutional: Negative.  HENT: Negative.    Eyes: Negative.   Respiratory: Negative.    Cardiovascular: Negative.   Gastrointestinal: Negative.   Psychiatric/Behavioral: Negative.       Today's Vitals   11/09/22 1053  BP: 132/64  Pulse: 77  Temp: 99 F (37.2 C)  TempSrc: Oral  Weight: 133 lb 9.6 oz (60.6 kg)  Height: 5\' 2"  (1.575 m)  PainSc: 0-No pain   Body mass index is 24.44 kg/m.  Wt Readings from Last 3 Encounters:  11/09/22 133 lb 9.6 oz (60.6 kg)  10/25/22 133 lb 12.8 oz (60.7 kg)  07/04/22 119 lb 0.8 oz (54 kg)     Objective:  Physical Exam Vitals reviewed.  Constitutional:      General: She is not in acute distress.    Appearance: Normal appearance. She is well-developed and normal weight.  Cardiovascular:     Rate and Rhythm: Normal rate and regular rhythm.     Pulses: Normal pulses.     Heart  sounds: Normal heart sounds. No murmur heard. Pulmonary:     Effort: Pulmonary effort is normal. No respiratory distress.     Breath sounds: Normal breath sounds. No wheezing.  Skin:    General: Skin is warm and dry.     Capillary Refill: Capillary refill takes less than 2 seconds.  Neurological:     General: No focal deficit present.     Mental Status: She is alert and oriented to person, place, and time.     Cranial Nerves: No cranial nerve deficit.     Motor: No weakness.  Psychiatric:        Mood and Affect: Mood normal. Mood is not anxious.        Behavior: Behavior normal. Behavior is not agitated.        Thought Content: Thought content normal.        Judgment: Judgment normal.         Assessment And Plan:  Essential hypertension Assessment & Plan: Blood pressure is fairly controlled.  Advised to continue current medications.  Also limit intake of salt.  She is encouraged to focus on lifestyle modifications.   Uncontrolled type 2 diabetes mellitus with hyperglycemia (HCC) Assessment & Plan: Hemoglobin A1c has remained elevated.  Will check again today.  Continue current medications as directed.  She has not had her diabetic eye exam at this time I have stressed the importance to have this done routinely.  Discussed the risk of eye damage related to poorly controlled diabetes.  Continue follow-up with endocrinology.  She has had a decrease in her insulin dose as her blood sugars have been dropping.  Orders: -     Basic metabolic panel -     Microalbumin / creatinine urine ratio -     Lantus SoloStar; Inject 32 Units into the skin daily. -     Hemoglobin A1c  Mixed hyperlipidemia Assessment & Plan: Chronic, stable.  Continue statin, tolerating well.  Orders: -     Lipid panel  COVID-19 vaccination declined Assessment & Plan: Declines covid 19 vaccine. Discussed risk of covid 33 and if she changes her mind about the vaccine to call the office. Education has been  provided regarding the importance of this vaccine but patient still declined. Advised may receive this vaccine at local pharmacy or Health Dept.or vaccine clinic. Aware to provide a copy of the vaccination record if obtained from local pharmacy or Health Dept.  Encouraged to take multivitamin, vitamin d, vitamin c and zinc  to increase immune system. Aware can call office if would like to have vaccine here at office. Verbalized acceptance and understanding.    Encounter for screening colonoscopy Assessment & Plan: According to USPTF Colorectal cancer Screening guidelines. Colonoscopy is recommended every 10 years, starting at age 16 years. Will refer to GI for colon cancer screening.   Orders: -     Ambulatory referral to Gastroenterology  Decreased estrogen level -     DG Bone Density; Future   She was to f/u with Dr. Rennis Golden for repeat Carotid doppler, she will contact to get that scheduled  Return for 4 month DM/HTN f/u and HM.  Patient was given opportunity to ask questions. Patient verbalized understanding of the plan and was able to repeat key elements of the plan. All questions were answered to their satisfaction.    Jeanell Sparrow, FNP, have reviewed all documentation for this visit. The documentation on 11/09/22 for the exam, diagnosis, procedures, and orders are all accurate and complete.   IF YOU HAVE BEEN REFERRED TO A SPECIALIST, IT MAY TAKE 1-2 WEEKS TO SCHEDULE/PROCESS THE REFERRAL. IF YOU HAVE NOT HEARD FROM US/SPECIALIST IN TWO WEEKS, PLEASE GIVE [place] A CALL AT [phone].

## 2022-11-09 NOTE — Patient Instructions (Signed)
You need to schedule an appt with Dr. Rennis Golden (manage cardiac issues) you were to have a repeat Carotid doppler in 2023 and Dr. Antonieta Iba (manage diabetes)

## 2022-11-10 LAB — BASIC METABOLIC PANEL
BUN/Creatinine Ratio: 17 (ref 12–28)
BUN: 12 mg/dL (ref 8–27)
CO2: 27 mmol/L (ref 20–29)
Calcium: 9.8 mg/dL (ref 8.7–10.3)
Chloride: 102 mmol/L (ref 96–106)
Creatinine, Ser: 0.7 mg/dL (ref 0.57–1.00)
Glucose: 101 mg/dL — ABNORMAL HIGH (ref 70–99)
Potassium: 4 mmol/L (ref 3.5–5.2)
Sodium: 144 mmol/L (ref 134–144)
eGFR: 90 mL/min/{1.73_m2} (ref >59)

## 2022-11-10 LAB — LIPID PANEL
Chol/HDL Ratio: 2.4 ratio (ref 0.0–4.4)
Cholesterol, Total: 168 mg/dL (ref 100–199)
HDL: 69 mg/dL (ref >39)
LDL Chol Calc (NIH): 81 mg/dL (ref 0–99)
Triglycerides: 103 mg/dL (ref 0–149)
VLDL Cholesterol Cal: 18 mg/dL (ref 5–40)

## 2022-11-10 LAB — HEMOGLOBIN A1C
Est. average glucose Bld gHb Est-mCnc: 134 mg/dL
Hgb A1c MFr Bld: 6.3 % — ABNORMAL HIGH (ref 4.8–5.6)

## 2022-11-14 ENCOUNTER — Telehealth: Payer: Self-pay

## 2022-11-14 NOTE — Telephone Encounter (Signed)
Patient would like a call to schedule a appointment

## 2022-11-15 NOTE — Telephone Encounter (Signed)
Patient is scheduled for 01/31/2022 and added to waitlist.

## 2022-11-16 DIAGNOSIS — E119 Type 2 diabetes mellitus without complications: Secondary | ICD-10-CM | POA: Diagnosis not present

## 2022-11-21 DIAGNOSIS — Z2821 Immunization not carried out because of patient refusal: Secondary | ICD-10-CM | POA: Insufficient documentation

## 2022-11-21 DIAGNOSIS — Z1211 Encounter for screening for malignant neoplasm of colon: Secondary | ICD-10-CM | POA: Insufficient documentation

## 2022-11-21 NOTE — Assessment & Plan Note (Signed)
Blood pressure is fairly controlled.  Advised to continue current medications.  Also limit intake of salt.  She is encouraged to focus on lifestyle modifications.

## 2022-11-21 NOTE — Assessment & Plan Note (Addendum)
Hemoglobin A1c has remained elevated.  Will check again today.  Continue current medications as directed.  She has not had her diabetic eye exam at this time I have stressed the importance to have this done routinely.  Discussed the risk of eye damage related to poorly controlled diabetes.  Continue follow-up with endocrinology.  She has had a decrease in her insulin dose as her blood sugars have been dropping.

## 2022-11-21 NOTE — Assessment & Plan Note (Signed)
 According to USPTF Colorectal cancer Screening guidelines. Colonoscopy is recommended every 10 years, starting at age 75 years. Will refer to GI for colon cancer screening.

## 2022-11-21 NOTE — Assessment & Plan Note (Signed)
Chronic, stable. Continue statin, tolerating well.

## 2022-11-21 NOTE — Assessment & Plan Note (Signed)

## 2022-11-22 ENCOUNTER — Ambulatory Visit: Payer: Medicare PPO | Admitting: Internal Medicine

## 2022-11-22 VITALS — BP 120/80 | HR 84 | Ht 62.0 in | Wt 132.0 lb

## 2022-11-22 DIAGNOSIS — Z794 Long term (current) use of insulin: Secondary | ICD-10-CM

## 2022-11-22 DIAGNOSIS — E1165 Type 2 diabetes mellitus with hyperglycemia: Secondary | ICD-10-CM

## 2022-11-22 DIAGNOSIS — E119 Type 2 diabetes mellitus without complications: Secondary | ICD-10-CM

## 2022-11-22 LAB — POCT GLUCOSE (DEVICE FOR HOME USE)
Glucose Fasting, POC: 69 mg/dL — AB (ref 70–99)
POC Glucose: 57 mg/dL — AB (ref 70–99)
POC Glucose: 107 mg/dL — AB (ref 70–99)

## 2022-11-22 MED ORDER — LANTUS SOLOSTAR 100 UNIT/ML ~~LOC~~ SOPN: 26.0000 [IU] | PEN_INJECTOR | Freq: Every day | SUBCUTANEOUS | 3 refills | Status: DC

## 2022-11-22 MED ORDER — INSULIN PEN NEEDLE 32G X 4 MM MISC: 1.0000 | Freq: Every day | 3 refills | Status: DC

## 2022-11-22 MED ORDER — PIOGLITAZONE HCL 30 MG PO TABS: 30.0000 mg | ORAL_TABLET | Freq: Every day | ORAL | 3 refills | Status: DC

## 2022-11-22 MED ORDER — EMPAGLIFLOZIN 25 MG PO TABS: 25.0000 mg | ORAL_TABLET | Freq: Every day | ORAL | 3 refills | Status: DC

## 2022-11-22 NOTE — Patient Instructions (Addendum)
-   Decrease Lantus 26 units daily  - Continue  Jardiance 25 mg daily  - Continue Pioglitazone 30 mg daily           HOW TO TREAT LOW BLOOD SUGARS (Blood sugar LESS THAN 70 MG/DL) Please follow the RULE OF 15 for the treatment of hypoglycemia treatment (when your (blood sugars are less than 70 mg/dL)   STEP 1: Take 15 grams of carbohydrates when your blood sugar is low, which includes:  3-4 GLUCOSE TABS  OR 3-4 OZ OF JUICE OR REGULAR SODA OR ONE TUBE OF GLUCOSE GEL    STEP 2: RECHECK blood sugar in 15 MINUTES STEP 3: If your blood sugar is still low at the 15 minute recheck --> then, go back to STEP 1 and treat AGAIN with another 15 grams of carbohydrates.
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
## 2022-11-28 LAB — HM DIABETES EYE EXAM

## 2022-12-06 ENCOUNTER — Encounter: Payer: Self-pay | Admitting: Internal Medicine

## 2022-12-19 ENCOUNTER — Ambulatory Visit: Payer: Medicare PPO | Admitting: Nurse Practitioner

## 2022-12-19 ENCOUNTER — Encounter: Payer: Self-pay | Admitting: Nurse Practitioner

## 2022-12-19 VITALS — BP 140/62 | HR 85 | Temp 97.8°F | Ht 62.0 in | Wt 133.4 lb

## 2022-12-19 DIAGNOSIS — M25551 Pain in right hip: Secondary | ICD-10-CM | POA: Diagnosis not present

## 2022-12-19 DIAGNOSIS — Z2821 Immunization not carried out because of patient refusal: Secondary | ICD-10-CM | POA: Diagnosis not present

## 2022-12-19 DIAGNOSIS — M25561 Pain in right knee: Secondary | ICD-10-CM

## 2022-12-19 MED ORDER — KETOROLAC TROMETHAMINE 60 MG/2ML IM SOLN
60.0000 mg | Freq: Once | INTRAMUSCULAR | Status: AC
  Administered 2022-12-19: 60 mg via INTRAMUSCULAR

## 2022-12-19 NOTE — Progress Notes (Signed)
Madelaine Bhat, CMA,acting as a Neurosurgeon for [name], FNP.,have documented all relevant documentation on the behalf of [name], FNP,as directed by  [name], FNP while in the presence of [name], FNP.  Subjective:  Patient ID: Gabriella Acosta , female    DOB: [birth year] , 77 y.o.   MRN: [id number]  Chief Complaint  Patient presents with   Knee Pain    HPI  Patient presents today for right knee and hip pain, patient reports its been ongoing for about 1 week. Patient has tried OTC pain relievers and they did not help. Reports she knows she has arthritis in her right knee.   Knee Pain  The incident occurred more than 1 week ago. There was no injury mechanism. The pain is present in the right hip and right knee. The quality of the pain is described as aching. The pain is at a severity of 7/10. The pain is moderate. The pain has been Constant since onset. Pertinent negatives include no inability to bear weight, loss of sensation, muscle weakness, numbness or tingling. The symptoms are aggravated by movement (climbing stairs). She has tried NSAIDs, acetaminophen and heat (biofreeze) for the symptoms. The treatment provided no relief.     Past Medical History:  Diagnosis Date   Diabetes mellitus without complication (HCC)    Hypertension    Type 2 diabetes mellitus (HCC) 07/22/2020     Family History  Problem Relation Age of Onset   Diabetes Mother    Diabetes Father    Heart disease Father    Breast cancer Neg Hx      Current Outpatient Medications:    amLODipine (NORVASC) 5 MG tablet, TAKE ONE TABLET BY MOUTH BEFORE BREAKFAST, Disp: 90 tablet, Rfl: 2   atorvastatin (LIPITOR) 80 MG tablet, TAKE ONE TABLET BY MOUTH AT BEDTIME, Disp: 90 tablet, Rfl: 1   Blood Glucose Monitoring Suppl DEVI, Check blood sugars twice daily. E11.9, Disp: 1 kit, Rfl: 0   empagliflozin (JARDIANCE) 25 MG TABS tablet, Take 1 tablet (25 mg total) by mouth daily before breakfast., Disp: 90  tablet, Rfl: 3   Injection Device for Insulin (CEQUR SIMPLICITY INSERTER) MISC, 1 Device by Does not apply route daily in the afternoon., Disp: 1 each, Rfl: 0   insulin glargine (LANTUS SOLOSTAR) 100 UNIT/ML Solostar Pen, Inject 26 Units into the skin daily., Disp: 30 mL, Rfl: 3   Insulin Pen Needle 32G X 4 MM MISC, 1 Device by Does not apply route daily in the afternoon., Disp: 100 each, Rfl: 3   losartan (COZAAR) 50 MG tablet, Take 50 mg by mouth daily., Disp: , Rfl:    magnesium 30 MG tablet, Take 30 mg by mouth once a week., Disp: , Rfl:    Multiple Vitamins-Minerals (MULTIVITAMIN WITH MINERALS) tablet, Take 1 tablet by mouth daily., Disp: , Rfl:    pioglitazone (ACTOS) 30 MG tablet, Take 1 tablet (30 mg total) by mouth daily., Disp: 90 tablet, Rfl: 3   potassium chloride (KLOR-CON M) 10 MEQ tablet, TAKE 1 TABLET BY MOUTH EVERY MORNING, Disp: 30 tablet, Rfl: 1   VASCEPA 1 g capsule, TAKE 2 CAPSULES(2 GRAMS) BY MOUTH TWICE DAILY, Disp: 120 capsule, Rfl: 3   losartan (COZAAR) 25 MG tablet, Take 1 tablet (25 mg total) by mouth daily., Disp: 30 tablet, Rfl: 2   meloxicam (MOBIC) 7.5 MG tablet, Take 1 tablet (7.5 mg total) by mouth daily., Disp: 30 tablet, Rfl: 0   methocarbamol (ROBAXIN) 500 MG  tablet, Take 1 tablet (500 mg total) by mouth 2 (two) times daily., Disp: 20 tablet, Rfl: 0   predniSONE (DELTASONE) 20 MG tablet, Take 2 tablets (40 mg total) by mouth daily., Disp: 10 tablet, Rfl: 0   No Known Allergies   Review of Systems  Constitutional: Negative.   Cardiovascular: Negative.   Neurological:  Negative for tingling and numbness.     Today's Vitals   12/19/22 1442  BP: (!) 140/62  Pulse: 85  Temp: 97.8 F (36.6 C)  TempSrc: Oral  Weight: 133 lb 6.4 oz (60.5 kg)  Height: 5\' 2"  (1.575 m)  PainSc: 7   PainLoc: Knee   Body mass index is 24.4 kg/m.  Wt Readings from Last 3 Encounters:  12/19/22 133 lb 6.4 oz (60.5 kg)  11/22/22 132 lb (59.9 kg)  11/09/22 133 lb 9.6 oz (60.6  kg)     Objective:  Physical Exam Vitals reviewed.  Constitutional:      General: She is not in acute distress.    Appearance: Normal appearance. She is well-developed and normal weight.  Cardiovascular:     Rate and Rhythm: Normal rate and regular rhythm.     Pulses: Normal pulses.     Heart sounds: Normal heart sounds. No murmur heard. Pulmonary:     Effort: Pulmonary effort is normal. No respiratory distress.     Breath sounds: Normal breath sounds. No wheezing.  Musculoskeletal:        General: Tenderness (anterior knee) present. No swelling, deformity or signs of injury. Normal range of motion.     Comments: Right hip tenderness and decreased range of motion.   Skin:    General: Skin is warm and dry.     Capillary Refill: Capillary refill takes less than 2 seconds.  Neurological:     General: No focal deficit present.     Mental Status: She is alert and oriented to person, place, and time.     Cranial Nerves: No cranial nerve deficit.     Motor: No weakness.  Psychiatric:        Mood and Affect: Mood normal. Mood is not anxious.        Behavior: Behavior normal. Behavior is not agitated.        Thought Content: Thought content normal.        Judgment: Judgment normal.         Assessment And Plan:  Right hip pain Assessment & Plan: Pain with range of motion. If symptoms worsen will consider referral to Orthopedics  Orders: -     Ketorolac Tromethamine  Acute pain of right knee Assessment & Plan: Tenderness to anterior knee, negative drawer test. Will treat with Toradol injection  Orders: -     Ketorolac Tromethamine  COVID-19 vaccination declined Assessment & Plan: Declines covid 19 vaccine. Discussed risk of covid 53 and if she changes her mind about the vaccine to call the office. Education has been provided regarding the importance of this vaccine but patient still declined. Advised may receive this vaccine at local pharmacy or Health Dept.or vaccine  clinic. Aware to provide a copy of the vaccination record if obtained from local pharmacy or Health Dept.  Encouraged to take multivitamin, vitamin d, vitamin c and zinc to increase immune system. Aware can call office if would like to have vaccine here at office. Verbalized acceptance and understanding.      Return for keep same next .  Patient was given opportunity to ask questions. Patient  verbalized understanding of the plan and was able to repeat key elements of the plan. All questions were answered to their satisfaction.    Jeanell Sparrow, FNP, have reviewed all documentation for this visit. The documentation on 12/19/22 for the exam, diagnosis, procedures, and orders are all accurate and complete.   IF YOU HAVE BEEN REFERRED TO A SPECIALIST, IT MAY TAKE 1-2 WEEKS TO SCHEDULE/PROCESS THE REFERRAL. IF YOU HAVE NOT HEARD FROM US/SPECIALIST IN TWO WEEKS, PLEASE GIVE [place] A CALL AT [phone] X [phone].

## 2022-12-21 ENCOUNTER — Emergency Department (HOSPITAL_COMMUNITY): Payer: Medicare PPO

## 2022-12-21 ENCOUNTER — Encounter (HOSPITAL_COMMUNITY): Payer: Self-pay

## 2022-12-21 ENCOUNTER — Emergency Department (HOSPITAL_COMMUNITY)
Admission: EM | Admit: 2022-12-21 | Discharge: 2022-12-21 | Disposition: A | Discharge status: Home / Self Care | Payer: Medicare PPO | Attending: Emergency Medicine | Admitting: Emergency Medicine

## 2022-12-21 DIAGNOSIS — Z794 Long term (current) use of insulin: Secondary | ICD-10-CM | POA: Insufficient documentation

## 2022-12-21 DIAGNOSIS — M51369 Other intervertebral disc degeneration, lumbar region without mention of lumbar back pain or lower extremity pain: Secondary | ICD-10-CM | POA: Diagnosis not present

## 2022-12-21 DIAGNOSIS — M160 Bilateral primary osteoarthritis of hip: Secondary | ICD-10-CM

## 2022-12-21 DIAGNOSIS — M1711 Unilateral primary osteoarthritis, right knee: Secondary | ICD-10-CM | POA: Diagnosis not present

## 2022-12-21 DIAGNOSIS — Z79899 Other long term (current) drug therapy: Secondary | ICD-10-CM | POA: Diagnosis not present

## 2022-12-21 DIAGNOSIS — E119 Type 2 diabetes mellitus without complications: Secondary | ICD-10-CM | POA: Insufficient documentation

## 2022-12-21 DIAGNOSIS — M79604 Pain in right leg: Secondary | ICD-10-CM

## 2022-12-21 DIAGNOSIS — M25561 Pain in right knee: Secondary | ICD-10-CM | POA: Insufficient documentation

## 2022-12-21 DIAGNOSIS — M799 Soft tissue disorder, unspecified: Secondary | ICD-10-CM | POA: Diagnosis not present

## 2022-12-21 DIAGNOSIS — I10 Essential (primary) hypertension: Secondary | ICD-10-CM | POA: Diagnosis not present

## 2022-12-21 DIAGNOSIS — M1611 Unilateral primary osteoarthritis, right hip: Secondary | ICD-10-CM | POA: Diagnosis not present

## 2022-12-21 DIAGNOSIS — M25551 Pain in right hip: Secondary | ICD-10-CM | POA: Diagnosis not present

## 2022-12-21 MED ORDER — PREDNISONE 20 MG PO TABS: 40.0000 mg | ORAL_TABLET | Freq: Every day | ORAL | 0 refills | Status: DC

## 2022-12-21 MED ORDER — KETOROLAC TROMETHAMINE 15 MG/ML IJ SOLN
15.0000 mg | Freq: Once | INTRAMUSCULAR | Status: AC
  Administered 2022-12-21: 15 mg via INTRAMUSCULAR
  Filled 2022-12-21: qty 1

## 2022-12-21 MED ORDER — METHOCARBAMOL 500 MG PO TABS: 500.0000 mg | ORAL_TABLET | Freq: Two times a day (BID) | ORAL | 0 refills | Status: AC

## 2022-12-21 MED ORDER — HYDROCODONE-ACETAMINOPHEN 5-325 MG PO TABS
1.0000 | ORAL_TABLET | Freq: Once | ORAL | Status: AC
  Administered 2022-12-21: 1 via ORAL
  Filled 2022-12-21: qty 1

## 2022-12-21 NOTE — Discharge Instructions (Addendum)
In the emergency room today for hip and knee pain.  Your x-rays show no acute fracture, however they do show arthritis in both of your hips.  Please continue taking Tylenol and ibuprofen and follow-up with primary care.  I have sent a muscle relaxer to your pharmacy, please do not operate heavy machinery or drive with this medication.  Have also sent 5 days of steroids to help decrease inflammation.  Please wait for specialist to contact you as discussed, as it appears primary care has placed referral to orthopedics - I have attached [facility] contact information if you do not hear back from PCP referral.

## 2022-12-21 NOTE — ED Provider Notes (Signed)
[place] EMERGENCY DEPARTMENT AT [facility] Provider Note   CSN: [id number] Arrival date & time: 12/21/22  4098     History  Chief Complaint  Patient presents with   Leg Pain    Gabriella Acosta is a 77 y.o. female with past medical history of diabetes, hypertension alcohol abuse presenting to emergency room with right hip pain and right knee pain for a week and a half.  Patient reports she was seen at primary care earlier this week for the same thing.  Reports pain has gotten worse and is not improving with ibuprofen and Tylenol.  Denies any recent injuries or falls.  Has history of right knee arthritis.  Patient attributes pain to change in weather.  Reports she has difficulty walking up and down stairs, pain is worse with ambulating. Requesting something for pain. Denies any other associated symptoms.   Leg Pain      Home Medications Prior to Admission medications   Medication Sig Start Date End Date Taking? Authorizing Provider  amLODipine (NORVASC) 5 MG tablet TAKE ONE TABLET BY MOUTH BEFORE BREAKFAST 09/25/22   [name], FNP  atorvastatin (LIPITOR) 80 MG tablet TAKE ONE TABLET BY MOUTH AT BEDTIME 06/02/22   [name], FNP  Blood Glucose Monitoring Suppl DEVI Check blood sugars twice daily. E11.9 10/03/18   [name], FNP  empagliflozin (JARDIANCE) 25 MG TABS tablet Take 1 tablet (25 mg total) by mouth daily before breakfast. 11/22/22   Shamleffer, Konrad Dolores, MD  Injection Device for Insulin (CEQUR SIMPLICITY INSERTER) MISC 1 Device by Does not apply route daily in the afternoon. 11/09/21   Shamleffer, Konrad Dolores, MD  insulin glargine (LANTUS SOLOSTAR) 100 UNIT/ML Solostar Pen Inject 26 Units into the skin daily. 11/22/22   Shamleffer, Konrad Dolores, MD  Insulin Pen Needle 32G X 4 MM MISC 1 Device by Does not apply route daily in the afternoon. 11/22/22   Shamleffer, Konrad Dolores, MD  losartan (COZAAR) 25 MG tablet Take 1 tablet (25 mg  total) by mouth daily. 07/04/22 10/02/22  Gloris Manchester, MD  losartan (COZAAR) 50 MG tablet Take 50 mg by mouth daily. 08/03/22   [provider]  magnesium 30 MG tablet Take 30 mg by mouth once a week.    [provider]  Multiple Vitamins-Minerals (MULTIVITAMIN WITH MINERALS) tablet Take 1 tablet by mouth daily.    [provider]  pioglitazone (ACTOS) 30 MG tablet Take 1 tablet (30 mg total) by mouth daily. 11/22/22   Shamleffer, Konrad Dolores, MD  potassium chloride (KLOR-CON M) 10 MEQ tablet TAKE 1 TABLET BY MOUTH EVERY MORNING 09/18/22   [name], FNP  VASCEPA 1 g capsule TAKE 2 CAPSULES(2 GRAMS) BY MOUTH TWICE DAILY 03/17/21   Hilty, Lisette Abu, MD      Allergies    Patient has no known allergies.    Review of Systems   Review of Systems  Musculoskeletal:  Positive for arthralgias.    Physical Exam Updated Vital Signs BP (!) 155/69 (BP Location: Left Arm)   Pulse 70   Temp 98 F (36.7 C) (Oral)   Resp 18   SpO2 96%  Physical Exam Vitals and nursing note reviewed.  Constitutional:      General: She is not in acute distress.    Appearance: She is not toxic-appearing.  HENT:     Head: Normocephalic and atraumatic.  Eyes:     General: No scleral icterus.    Conjunctiva/sclera: Conjunctivae normal.  Cardiovascular:  Rate and Rhythm: Normal rate and regular rhythm.     Pulses: Normal pulses.     Heart sounds: Normal heart sounds.  Pulmonary:     Effort: Pulmonary effort is normal. No respiratory distress.     Breath sounds: Normal breath sounds.  Abdominal:     General: Abdomen is flat. Bowel sounds are normal.     Palpations: Abdomen is soft.     Tenderness: There is no abdominal tenderness.  Musculoskeletal:     Comments: Right knee: No obvious joint effusion, no erythema or deformity.  Patient able to move this extremity without difficulty.  Strength of lower extremity equal bilaterally.  Tenderness to palpation over anterior medial and  lateral aspect of joint, near patellar tendon Right hip: No tenderness to palpation or deformity noted over femoral head.  Pain is generalized.  Patient able to move extremity without difficulty.  Skin:    General: Skin is warm and dry.     Findings: No lesion.  Neurological:     General: No focal deficit present.     Mental Status: She is alert and oriented to person, place, and time. Mental status is at baseline.     ED Results / Procedures / Treatments   Labs (all labs ordered are listed, but only abnormal results are displayed) Labs Reviewed - No data to display  EKG None  Radiology No results found.  Procedures Procedures    Medications Ordered in ED Medications  HYDROcodone-acetaminophen (NORCO/VICODIN) 5-325 MG per tablet 1 tablet (has no administration in time range)    ED Course/ Medical Decision Making/ A&P                                 Medical Decision Making Amount and/or Complexity of Data Reviewed Radiology: ordered.  Risk Prescription drug management.   Brice Schurz 77 y.o. presented today for right hip and knee pain. Working Ddx: MSK in nature, fracture, epidural hematoma/abscess, cauda equina syndrome, spinal stenosis, spinal malignancy, discitis, spinal infection, spondylitises/ spondylosis, conus medullaris, DDD of the back.  R/o DDx: No focal neurological deficits, no loss of bowel or bladder control.  Denies fever, night sweats, weight loss, h/o cancer, IVDU.  PMHX: diabetes, hypertension alcohol abuse    Review of prior external notes: 12/19/2022 for office visit for similar thing  Labs: None   Imaging: Imaging of right knee, right hip without acute abnormality.  Patient does have bilateral osteoarthritis of both hips. Also "Subcutaneous soft tissue prominence along the medial knee. Recommend correlation with physical examination in this area," patient has no tenderness to palpation over medial knee.  Predominantly pain along  anterior knee.     Problem List / ED Course / Critical interventions / Medication management  Reporting to emergency room with right hip pain and right knee pain.  Patient moving both extremities without difficulty able to ambulate with steady gait.  Patient's x-ray without acute abnormality.  X-ray consistent with bilateral arthritis of hip joint.  Patient's symptoms are aggravated with movement including climbing stairs.  Patient reports she tried topical medications, NSAIDs and acetaminophen without relief.  Discussed trying short-term steroid and muscle relaxer in addition to at home treatment and following up with orthopedics.  Patient hemodynamically stable, well-appearing.  Stable for discharge I ordered medication including Norco, Toradol  Reevaluation of the patient after these medicines showed that the patient improved Patients vitals assessed. Upon arrival patient is hemodynamically  stable.  I have reviewed the patients home medicines and have made adjustments as needed  Consult: None      Plan:  F/u w/ PCP in 2-3d to ensure resolution of sx.  RICE protocol and pain medicine discussed with patient.  Patient was given return precautions. Patient stable for discharge at this time.  Patient educated on sx/dx and verbalized understanding of plan. Return to ER with new or worsening sx.          Final Clinical Impression(s) / ED Diagnoses Final diagnoses:  Right leg pain  Primary osteoarthritis of both hips    Rx / DC Orders ED Discharge Orders     None         Smitty Knudsen, PA-C 12/21/22 1130    Royanne Foots, DO 12/24/22 2038

## 2022-12-21 NOTE — ED Triage Notes (Signed)
Pt presents with c/o right leg pain for approx a week and a half. Pt denies any injury. Pt reports the pain is in her right hip, radiates down her right thigh, and is also hurting on her right knee cap. Pt reports she has taken multiple medications and used creams with no relief. Pt did see her PCP and was given a shot for the pain with no relief still.

## 2022-12-28 ENCOUNTER — Ambulatory Visit: Payer: Medicare PPO | Admitting: Physician Assistant

## 2022-12-28 ENCOUNTER — Encounter: Payer: Self-pay | Admitting: Physician Assistant

## 2022-12-28 DIAGNOSIS — M25551 Pain in right hip: Secondary | ICD-10-CM

## 2022-12-28 DIAGNOSIS — M25561 Pain in right knee: Secondary | ICD-10-CM | POA: Diagnosis not present

## 2022-12-28 MED ORDER — MELOXICAM 7.5 MG PO TABS: 7.5000 mg | ORAL_TABLET | Freq: Every day | ORAL | 0 refills | Status: DC
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
## 2023-01-01 ENCOUNTER — Ambulatory Visit: Payer: Medicare PPO | Admitting: Internal Medicine

## 2023-01-07 ENCOUNTER — Encounter: Payer: Self-pay | Admitting: Nurse Practitioner

## 2023-01-07 DIAGNOSIS — M25551 Pain in right hip: Secondary | ICD-10-CM | POA: Insufficient documentation

## 2023-01-07 NOTE — Assessment & Plan Note (Signed)
Pain with range of motion. If symptoms worsen will consider referral to Orthopedics

## 2023-01-07 NOTE — Assessment & Plan Note (Signed)
Tenderness to anterior knee, negative drawer test. Will treat with Toradol injection

## 2023-01-07 NOTE — Assessment & Plan Note (Signed)

## 2023-01-08 ENCOUNTER — Other Ambulatory Visit: Payer: Self-pay | Admitting: Nurse Practitioner

## 2023-01-15 ENCOUNTER — Ambulatory Visit: Payer: Medicare PPO | Attending: Physician Assistant | Admitting: Physical Therapy

## 2023-03-07 ENCOUNTER — Other Ambulatory Visit: Payer: Self-pay | Admitting: Internal Medicine

## 2023-03-07 DIAGNOSIS — Z794 Long term (current) use of insulin: Secondary | ICD-10-CM

## 2023-03-09 ENCOUNTER — Other Ambulatory Visit: Payer: Self-pay | Admitting: Physician Assistant

## 2023-03-14 ENCOUNTER — Encounter: Payer: Medicare PPO | Admitting: Nurse Practitioner

## 2023-03-14 ENCOUNTER — Other Ambulatory Visit: Payer: Self-pay

## 2023-03-14 MED ORDER — BD PEN NEEDLE MICRO U/F 32G X 6 MM MISC: 3 refills | Status: AC

## 2023-04-18 ENCOUNTER — Encounter: Payer: Self-pay | Admitting: Nurse Practitioner

## 2023-04-18 ENCOUNTER — Ambulatory Visit (INDEPENDENT_AMBULATORY_CARE_PROVIDER_SITE_OTHER): Admitting: Nurse Practitioner

## 2023-04-18 VITALS — BP 120/80 | HR 57 | Temp 98.7°F | Ht 62.0 in | Wt 131.4 lb

## 2023-04-18 DIAGNOSIS — E1165 Type 2 diabetes mellitus with hyperglycemia: Secondary | ICD-10-CM | POA: Diagnosis not present

## 2023-04-18 DIAGNOSIS — Z0000 Encounter for general adult medical examination without abnormal findings: Secondary | ICD-10-CM

## 2023-04-18 DIAGNOSIS — Z2821 Immunization not carried out because of patient refusal: Secondary | ICD-10-CM | POA: Diagnosis not present

## 2023-04-18 DIAGNOSIS — E782 Mixed hyperlipidemia: Secondary | ICD-10-CM

## 2023-04-18 DIAGNOSIS — I10 Essential (primary) hypertension: Secondary | ICD-10-CM

## 2023-04-18 NOTE — Assessment & Plan Note (Signed)
 Last HgbA1C 6.4, will check today and forward results to Dr Lonzo Cloud.  Continue current medications as directed.  She has not completed a diabetic eye exam at this time an is encouraged to do so as previously discussed.

## 2023-04-18 NOTE — Progress Notes (Signed)
 Del Favia, CMA,acting as a Neurosurgeon for Gabriella Epley, FNP.,have documented all relevant documentation on the behalf of Gabriella Epley, FNP,as directed by  Gabriella Epley, FNP while in the presence of Gabriella Epley, FNP.  Subjective:    Patient ID: Gabriella Acosta , female    DOB: 1945/12/27 , 78 y.o.   MRN: [id number]  Chief Complaint  Patient presents with   Annual Exam    HPI  Patient presents today for HM, Patient reports compliance with medication. Patient denies any chest pain, SOB, or headaches. Patient has no concerns today.   Takes blood pressure medications as ordered, no issues or concerns.  Only checks BP if she is dizzy or has a headache, which is not often, less than every 6 months.  She continues to smoke 0.3 PPD, is not interested in smoking cession information at this time.   She is not checking her blood sugars at home, hasn't experienced any episodes where she has felt her bp was low or elevated.  She is taking jaurdience and lantus  as ordered and has no concerns.        Past Medical History:  Diagnosis Date   Diabetes mellitus without complication (HCC)    Hypertension    Type 2 diabetes mellitus (HCC) 07/22/2020     Family History  Problem Relation Age of Onset   Diabetes Mother    Diabetes Father    Heart disease Father    Breast cancer Neg Hx      Current Outpatient Medications:    amLODipine  (NORVASC ) 5 MG tablet, TAKE ONE TABLET BY MOUTH BEFORE BREAKFAST, Disp: 90 tablet, Rfl: 2   atorvastatin  (LIPITOR) 80 MG tablet, TAKE ONE TABLET BY MOUTH AT BEDTIME, Disp: 90 tablet, Rfl: 1   Blood Glucose Monitoring Suppl DEVI, Check blood sugars twice daily. E11.9, Disp: 1 kit, Rfl: 0   empagliflozin  (JARDIANCE ) 25 MG TABS tablet, Take 1 tablet (25 mg total) by mouth daily before breakfast., Disp: 90 tablet, Rfl: 3   Injection Device for Insulin  (CEQUR SIMPLICITY INSERTER) MISC, 1 Device by Does not apply route daily in the afternoon., Disp: 1 each, Rfl: 0    insulin  glargine (LANTUS  SOLOSTAR) 100 UNIT/ML Solostar Pen, INJECT 40 UNITS INTO THE SKIN DAILY, Disp: 45 mL, Rfl: 0   Insulin  Pen Needle (BD PEN NEEDLE MICRO U/F) 32G X 6 MM MISC, USE AS DIRECTED ONCE DAILY EVERY EVENING, Disp: 100 each, Rfl: 3   Insulin  Pen Needle 32G X 4 MM MISC, 1 Device by Does not apply route daily in the afternoon., Disp: 100 each, Rfl: 3   losartan  (COZAAR ) 50 MG tablet, Take 50 mg by mouth daily., Disp: , Rfl:    magnesium 30 MG tablet, Take 30 mg by mouth once a week., Disp: , Rfl:    methocarbamol  (ROBAXIN ) 500 MG tablet, Take 1 tablet (500 mg total) by mouth 2 (two) times daily., Disp: 20 tablet, Rfl: 0   Multiple Vitamins-Minerals (MULTIVITAMIN WITH MINERALS) tablet, Take 1 tablet by mouth daily., Disp: , Rfl:    pioglitazone  (ACTOS ) 30 MG tablet, Take 1 tablet (30 mg total) by mouth daily., Disp: 90 tablet, Rfl: 3   potassium chloride  (KLOR-CON  M) 10 MEQ tablet, TAKE 1 TABLET BY MOUTH EVERY MORNING, Disp: 30 tablet, Rfl: 1   VASCEPA  1 g capsule, TAKE 2 CAPSULES(2 GRAMS) BY MOUTH TWICE DAILY, Disp: 120 capsule, Rfl: 3   losartan  (COZAAR ) 25 MG tablet, Take 1 tablet (25 mg total) by mouth daily., Disp:  30 tablet, Rfl: 2   meloxicam  (MOBIC ) 7.5 MG tablet, TAKE 1 TABLET(7.5 MG) BY MOUTH DAILY, Disp: 30 tablet, Rfl: 0   No Known Allergies    The patient states she uses post menopausal status for birth control. No LMP recorded. Patient is postmenopausal.. Negative for Dysmenorrhea and Negative for Menorrhagia. Negative for: breast discharge, breast lump(s), breast pain and breast self exam. Associated symptoms include abnormal vaginal bleeding. Pertinent negatives include abnormal bleeding (hematology), anxiety, decreased libido, depression, difficulty falling sleep, dyspareunia, history of infertility, nocturia, sexual dysfunction, sleep disturbances, urinary incontinence, urinary urgency, vaginal discharge and vaginal itching. Diet regular.The patient states her exercise  level is    . The patient's tobacco use is:  Social History   Tobacco Use  Smoking Status Every Day   Current packs/day: 0.33   Average packs/day: 0.3 packs/day for 30.0 years (9.9 ttl pk-yrs)   Types: Cigarettes  Smokeless Tobacco Current  . She has been exposed to passive smoke. The patient's alcohol use is:  Social History   Substance and Sexual Activity  Alcohol Use Not Currently   Comment: very little    Review of Systems  All other systems reviewed and are negative.    Today's Vitals   04/18/23 1514  BP: 120/80  Pulse: (!) 57  Temp: 98.7 F (37.1 C)  TempSrc: Oral  Weight: 131 lb 6.4 oz (59.6 kg)  Height: 5\' 2"  (1.575 m)  PainSc: 0-No pain   Body mass index is 24.03 kg/m.  Wt Readings from Last 3 Encounters:  04/18/23 131 lb 6.4 oz (59.6 kg)  12/19/22 133 lb 6.4 oz (60.5 kg)  11/22/22 132 lb (59.9 kg)     Objective:  Physical Exam Constitutional:      Appearance: Normal appearance. She is normal weight.  HENT:     Head: Normocephalic and atraumatic.     Right Ear: External ear normal.     Left Ear: External ear normal.     Nose: Nose normal.     Mouth/Throat:     Mouth: Mucous membranes are moist.     Pharynx: Oropharynx is clear.  Eyes:     Pupils: Pupils are equal, round, and reactive to light.  Cardiovascular:     Rate and Rhythm: Normal rate and regular rhythm.     Pulses: Normal pulses.     Heart sounds: Normal heart sounds.  Pulmonary:     Effort: Pulmonary effort is normal.     Breath sounds: Normal breath sounds.  Musculoskeletal:     Cervical back: Normal range of motion.  Skin:    General: Skin is warm and dry.     Capillary Refill: Capillary refill takes 2 to 3 seconds.  Neurological:     General: No focal deficit present.     Mental Status: She is alert and oriented to person, place, and time.  Psychiatric:        Mood and Affect: Mood normal.        Thought Content: Thought content normal.        Judgment: Judgment normal.       Diabetic foot exam was performed with the following findings:   No deformities, ulcerations, or other skin breakdown Normal sensation of 10g monofilament Intact posterior tibialis and dorsalis pedis pulses All surfaces of feet examined, no wounds, swelling,  excessive dryness.  Patient is able to visualize the bottom of her feet        Assessment And Plan:  Encounter for annual health examination Assessment & Plan: Behavior modifications discussed and diet history reviewed.   Pt will continue to exercise regularly and modify diet with low GI, plant based foods and decrease intake of processed foods.  Recommend intake of daily multivitamin, Vitamin D, and calcium .  Recommend mammogram and colonoscopy for preventive screenings, as well as recommend immunizations that include influenza, TDAP, and Shingles    Essential hypertension Assessment & Plan: Blood pressure stable, continue amiodipine and losartan  as prescribed.  Advised follow low salt diet and consider lifestyle modifications. EKG done with SR HR 57  Orders: -     EKG 12-Lead -     CBC with Differential/Platelet -     CMP14+EGFR -     Lipid panel  Uncontrolled type 2 diabetes mellitus with hyperglycemia (HCC) Assessment & Plan: Last HgbA1C 6.4, will check today and forward results to Dr Rosalea Collin.  Continue current medications as directed.  She has not completed a diabetic eye exam at this time an is encouraged to do so as previously discussed.    Orders: -     Hemoglobin A1c  Mixed hyperlipidemia Assessment & Plan: Chronic, stable.  Continue statin, tolerating well.   COVID-19 vaccination declined Assessment & Plan: Declines covid 19 vaccine. Discussed risk of covid 57 and if she changes her mind about the vaccine to call the office. Education has been provided regarding the importance of this vaccine but patient still declined. Advised may receive this vaccine at local pharmacy or Health Dept.or  vaccine clinic. Aware to provide a copy of the vaccination record if obtained from local pharmacy or Health Dept.  Encouraged to take multivitamin, vitamin d, vitamin c and zinc to increase immune system. Aware can call office if would like to have vaccine here at office. Verbalized acceptance and understanding.     Return for uncontrolled dm check 3 months, 1 year physical. Patient was given opportunity to ask questions. Patient verbalized understanding of the plan and was able to repeat key elements of the plan. All questions were answered to their satisfaction.   Gabriella Epley, FNP  I, Gabriella Epley, FNP, have reviewed all documentation for this visit. The documentation on 04/18/23 for the exam, diagnosis, procedures, and orders are all accurate and complete.

## 2023-04-18 NOTE — Assessment & Plan Note (Addendum)
 Blood pressure stable, continue amiodipine and losartan  as prescribed.  Advised follow low salt diet and consider lifestyle modifications. EKG done with SR HR 57

## 2023-04-19 ENCOUNTER — Other Ambulatory Visit: Payer: Self-pay | Admitting: Nurse Practitioner

## 2023-04-19 LAB — CMP14+EGFR
ALT: 17 IU/L (ref 0–32)
AST: 24 IU/L (ref 0–40)
Albumin: 4.4 g/dL (ref 3.8–4.8)
Alkaline Phosphatase: 135 IU/L — ABNORMAL HIGH (ref 44–121)
BUN/Creatinine Ratio: 17 (ref 12–28)
BUN: 9 mg/dL (ref 8–27)
Bilirubin Total: 0.3 mg/dL (ref 0.0–1.2)
CO2: 22 mmol/L (ref 20–29)
Calcium: 9.3 mg/dL (ref 8.7–10.3)
Chloride: 100 mmol/L (ref 96–106)
Creatinine, Ser: 0.52 mg/dL — ABNORMAL LOW (ref 0.57–1.00)
Globulin, Total: 2.6 g/dL (ref 1.5–4.5)
Glucose: 129 mg/dL — ABNORMAL HIGH (ref 70–99)
Potassium: 3.9 mmol/L (ref 3.5–5.2)
Sodium: 142 mmol/L (ref 134–144)
Total Protein: 7 g/dL (ref 6.0–8.5)
eGFR: 96 mL/min/{1.73_m2} (ref >59)

## 2023-04-19 LAB — CBC WITH DIFFERENTIAL/PLATELET
Basophils Absolute: 0 10*3/uL (ref 0.0–0.2)
Basos: 0 %
EOS (ABSOLUTE): 0.1 10*3/uL (ref 0.0–0.4)
Eos: 1 %
Hematocrit: 45.2 % (ref 34.0–46.6)
Hemoglobin: 15.2 g/dL (ref 11.1–15.9)
Immature Grans (Abs): 0 10*3/uL (ref 0.0–0.1)
Immature Granulocytes: 0 %
Lymphocytes Absolute: 2 10*3/uL (ref 0.7–3.1)
Lymphs: 22 %
MCH: 30.3 pg (ref 26.6–33.0)
MCHC: 33.6 g/dL (ref 31.5–35.7)
MCV: 90 fL (ref 79–97)
Monocytes Absolute: 0.5 10*3/uL (ref 0.1–0.9)
Monocytes: 5 %
Neutrophils Absolute: 6.6 10*3/uL (ref 1.4–7.0)
Neutrophils: 72 %
Platelets: 132 10*3/uL — ABNORMAL LOW (ref 150–450)
RBC: 5.02 x10E6/uL (ref 3.77–5.28)
RDW: 13.7 % (ref 11.7–15.4)
WBC: 9.2 10*3/uL (ref 3.4–10.8)

## 2023-04-19 LAB — LIPID PANEL
Chol/HDL Ratio: 3 ratio (ref 0.0–4.4)
Cholesterol, Total: 214 mg/dL — ABNORMAL HIGH (ref 100–199)
HDL: 72 mg/dL (ref >39)
LDL Chol Calc (NIH): 114 mg/dL — ABNORMAL HIGH (ref 0–99)
Triglycerides: 163 mg/dL — ABNORMAL HIGH (ref 0–149)
VLDL Cholesterol Cal: 28 mg/dL (ref 5–40)

## 2023-04-19 LAB — HEMOGLOBIN A1C
Est. average glucose Bld gHb Est-mCnc: 186 mg/dL
Hgb A1c MFr Bld: 8.1 % — ABNORMAL HIGH (ref 4.8–5.6)

## 2023-04-29 ENCOUNTER — Encounter: Payer: Self-pay | Admitting: Nurse Practitioner

## 2023-04-29 DIAGNOSIS — Z0000 Encounter for general adult medical examination without abnormal findings: Secondary | ICD-10-CM | POA: Insufficient documentation

## 2023-04-29 NOTE — Assessment & Plan Note (Signed)

## 2023-04-29 NOTE — Assessment & Plan Note (Signed)
Chronic, stable. Continue statin, tolerating well.

## 2023-04-29 NOTE — Assessment & Plan Note (Signed)

## 2023-05-12 ENCOUNTER — Other Ambulatory Visit: Payer: Self-pay | Admitting: Internal Medicine

## 2023-06-02 ENCOUNTER — Other Ambulatory Visit: Payer: Self-pay | Admitting: Nurse Practitioner

## 2023-06-06 ENCOUNTER — Encounter: Payer: Self-pay | Admitting: Nurse Practitioner

## 2023-06-06 ENCOUNTER — Ambulatory Visit (INDEPENDENT_AMBULATORY_CARE_PROVIDER_SITE_OTHER): Payer: Self-pay | Admitting: Nurse Practitioner

## 2023-06-06 VITALS — BP 120/70 | HR 85 | Temp 98.8°F | Ht 62.0 in | Wt 126.6 lb

## 2023-06-06 DIAGNOSIS — E1165 Type 2 diabetes mellitus with hyperglycemia: Secondary | ICD-10-CM

## 2023-06-06 DIAGNOSIS — Z2821 Immunization not carried out because of patient refusal: Secondary | ICD-10-CM

## 2023-06-06 DIAGNOSIS — Z720 Tobacco use: Secondary | ICD-10-CM

## 2023-06-06 DIAGNOSIS — M25571 Pain in right ankle and joints of right foot: Secondary | ICD-10-CM | POA: Diagnosis not present

## 2023-06-06 NOTE — Assessment & Plan Note (Signed)
 Smoking cessation instruction/counseling given:  counseled patient on the dangers of tobacco use, advised patient to stop smoking, and reviewed strategies to maximize success

## 2023-06-06 NOTE — Assessment & Plan Note (Signed)
 She has a firm raised area to her lateral foot near the plantar surface of her foot. She also has high arches. She has on converse which have no arch, this may be affecting her pain and ankle "turning"

## 2023-06-06 NOTE — Addendum Note (Signed)
 Addended by: Alexis Anger on: 06/06/2023 04:33 PM   Modules accepted: Orders

## 2023-06-06 NOTE — Progress Notes (Addendum)
 Del Favia, CMA,acting as a Neurosurgeon for [name], FNP.,have documented all relevant documentation on the behalf of [name], FNP,as directed by  [name], FNP while in the presence of [name], FNP.  Subjective:  Patient ID: Gabriella Acosta , female    DOB: 1945/06/16 , 78 y.o.   MRN: [id number]  Chief Complaint  Patient presents with   Ankle Pain    Patient presents today for right ankle pain, she reports her ankle goes outward when she walks. Patient reports she first noticed it months ago. She reports it is getting worse. She reports it is sometimes swollen.    Hypertension    She reports she can't take lostartan because it makes her dizzy.     HPI  She reports having pain to her right ankle, reports "my ankle turns outwards when I walk". This has been ongoing for several months. She has not seen an orthopedic or foot doctor in the last few years.   She is no longer taking losartan  due to it making her lightheaded.      Past Medical History:  Diagnosis Date   Diabetes mellitus without complication (HCC)    Hypertension    Type 2 diabetes mellitus (HCC) 07/22/2020     Family History  Problem Relation Age of Onset   Diabetes Mother    Diabetes Father    Heart disease Father    Breast cancer Neg Hx      Current Outpatient Medications:    amLODipine  (NORVASC ) 5 MG tablet, TAKE 1 TABLET BY MOUTH BEFORE BREAKFAST, Disp: 90 tablet, Rfl: 2   atorvastatin  (LIPITOR) 80 MG tablet, TAKE ONE TABLET BY MOUTH AT BEDTIME, Disp: 90 tablet, Rfl: 1   Blood Glucose Monitoring Suppl DEVI, Check blood sugars twice daily. E11.9, Disp: 1 kit, Rfl: 0   empagliflozin  (JARDIANCE ) 25 MG TABS tablet, TAKE 1 TABLET(25 MG) BY MOUTH DAILY BEFORE BREAKFAST, Disp: 90 tablet, Rfl: 1   Injection Device for Insulin  (CEQUR SIMPLICITY INSERTER) MISC, 1 Device by Does not apply route daily in the afternoon., Disp: 1 each, Rfl: 0   insulin  glargine (LANTUS  SOLOSTAR) 100 UNIT/ML Solostar  Pen, INJECT 40 UNITS INTO THE SKIN DAILY, Disp: 45 mL, Rfl: 0   Insulin  Pen Needle (BD PEN NEEDLE MICRO U/F) 32G X 6 MM MISC, USE AS DIRECTED ONCE DAILY EVERY EVENING, Disp: 100 each, Rfl: 3   Insulin  Pen Needle 32G X 4 MM MISC, 1 Device by Does not apply route daily in the afternoon., Disp: 100 each, Rfl: 3   magnesium 30 MG tablet, Take 30 mg by mouth once a week., Disp: , Rfl:    meloxicam  (MOBIC ) 7.5 MG tablet, TAKE 1 TABLET(7.5 MG) BY MOUTH DAILY, Disp: 30 tablet, Rfl: 0   methocarbamol  (ROBAXIN ) 500 MG tablet, Take 1 tablet (500 mg total) by mouth 2 (two) times daily., Disp: 20 tablet, Rfl: 0   Multiple Vitamins-Minerals (MULTIVITAMIN WITH MINERALS) tablet, Take 1 tablet by mouth daily., Disp: , Rfl:    pioglitazone  (ACTOS ) 30 MG tablet, Take 1 tablet (30 mg total) by mouth daily., Disp: 90 tablet, Rfl: 3   potassium chloride  (KLOR-CON  M) 10 MEQ tablet, TAKE 1 TABLET BY MOUTH EVERY MORNING, Disp: 30 tablet, Rfl: 1   VASCEPA  1 g capsule, TAKE 2 CAPSULES(2 GRAMS) BY MOUTH TWICE DAILY, Disp: 120 capsule, Rfl: 3   losartan  (COZAAR ) 25 MG tablet, Take 1 tablet (25 mg total) by mouth daily. (Patient not taking: Reported on 06/06/2023), Disp:  30 tablet, Rfl: 2   losartan  (COZAAR ) 50 MG tablet, Take 50 mg by mouth daily. (Patient not taking: Reported on 06/06/2023), Disp: , Rfl:    No Known Allergies   Review of Systems  Constitutional: Negative.   HENT: Negative.    Eyes: Negative.   Respiratory: Negative.    Cardiovascular: Negative.   Gastrointestinal: Negative.   Psychiatric/Behavioral: Negative.       Today's Vitals   06/06/23 1458  BP: 120/70  Pulse: 85  Temp: 98.8 F (37.1 C)  TempSrc: Oral  Weight: 126 lb 9.6 oz (57.4 kg)  Height: 5\' 2"  (1.575 m)  PainSc: 0-No pain   Body mass index is 23.16 kg/m.  Wt Readings from Last 3 Encounters:  06/06/23 126 lb 9.6 oz (57.4 kg)  04/18/23 131 lb 6.4 oz (59.6 kg)  12/19/22 133 lb 6.4 oz (60.5 kg)    Objective:  Physical  Exam Vitals and nursing note reviewed.  Constitutional:      General: She is not in acute distress.    Appearance: Normal appearance. She is well-developed and normal weight.  Cardiovascular:     Rate and Rhythm: Normal rate and regular rhythm.     Pulses: Normal pulses.     Heart sounds: Normal heart sounds. No murmur heard. Pulmonary:     Effort: Pulmonary effort is normal. No respiratory distress.     Breath sounds: Normal breath sounds. No wheezing.  Musculoskeletal:        General: Tenderness (right lateral foot tenderness, there is a firm raised area to right lateral foot near plantar surface. She does have high arches.) present.  Skin:    General: Skin is warm and dry.     Capillary Refill: Capillary refill takes less than 2 seconds.  Neurological:     General: No focal deficit present.     Mental Status: She is alert and oriented to person, place, and time.     Cranial Nerves: No cranial nerve deficit.     Motor: No weakness.  Psychiatric:        Mood and Affect: Mood normal. Mood is not anxious.        Behavior: Behavior normal. Behavior is not agitated.        Thought Content: Thought content normal.        Judgment: Judgment normal.      Assessment And Plan:  Acute right ankle pain Assessment & Plan: She has a firm raised area to her lateral foot near the plantar surface of her foot. She also has high arches. She has on converse which have no arch, this may be affecting her pain and ankle "turning"  Orders: -     Ambulatory referral to Podiatry  COVID-19 vaccination declined Assessment & Plan: Declines covid 19 vaccine. Discussed risk of covid 77 and if she changes her mind about the vaccine to call the office. Education has been provided regarding the importance of this vaccine but patient still declined. Advised may receive this vaccine at local pharmacy or Health Dept.or vaccine clinic. Aware to provide a copy of the vaccination record if obtained from local  pharmacy or Health Dept.  Encouraged to take multivitamin, vitamin d, vitamin c and zinc to increase immune system. Aware can call office if would like to have vaccine here at office. Verbalized acceptance and understanding.    Tobacco abuse Assessment & Plan: Smoking cessation instruction/counseling given:  counseled patient on the dangers of tobacco use, advised patient to stop smoking,  and reviewed strategies to maximize success    Uncontrolled type 2 diabetes mellitus with hyperglycemia (HCC) Assessment & Plan: She is being followed by Endocrinology, will check urine micro   Orders: -     Microalbumin / creatinine urine ratio   Return for keep same next.  Patient was given opportunity to ask questions. Patient verbalized understanding of the plan and was able to repeat key elements of the plan. All questions were answered to their satisfaction.    Inge Mangle, FNP, have reviewed all documentation for this visit. The documentation on 06/17/23 for the exam, diagnosis, procedures, and orders are all accurate and complete.   IF YOU HAVE BEEN REFERRED TO A SPECIALIST, IT MAY TAKE 1-2 WEEKS TO SCHEDULE/PROCESS THE REFERRAL. IF YOU HAVE NOT HEARD FROM US /SPECIALIST IN TWO WEEKS, PLEASE GIVE US  A CALL AT [phone].

## 2023-06-06 NOTE — Assessment & Plan Note (Signed)

## 2023-06-07 LAB — MICROALBUMIN / CREATININE URINE RATIO
Creatinine, Urine: 123.2 mg/dL
Microalb/Creat Ratio: 41 mg/g{creat} — ABNORMAL HIGH (ref 0–29)
Microalbumin, Urine: 50 ug/mL

## 2023-06-17 NOTE — Assessment & Plan Note (Signed)
 She is being followed by Endocrinology, will check urine micro

## 2023-06-17 NOTE — Progress Notes (Signed)
 Let me know if I need to add anything else

## 2023-06-25 ENCOUNTER — Ambulatory Visit (INDEPENDENT_AMBULATORY_CARE_PROVIDER_SITE_OTHER)

## 2023-06-25 ENCOUNTER — Encounter: Payer: Self-pay | Admitting: Podiatry

## 2023-06-25 ENCOUNTER — Ambulatory Visit

## 2023-06-25 ENCOUNTER — Ambulatory Visit: Payer: Self-pay | Admitting: Nurse Practitioner

## 2023-06-25 ENCOUNTER — Ambulatory Visit (INDEPENDENT_AMBULATORY_CARE_PROVIDER_SITE_OTHER): Admitting: Podiatry

## 2023-06-25 DIAGNOSIS — Q6671 Congenital pes cavus, right foot: Secondary | ICD-10-CM | POA: Diagnosis not present

## 2023-06-25 DIAGNOSIS — M7671 Peroneal tendinitis, right leg: Secondary | ICD-10-CM | POA: Diagnosis not present

## 2023-06-25 MED ORDER — DICLOFENAC SODIUM 75 MG PO TBEC: 75.0000 mg | DELAYED_RELEASE_TABLET | Freq: Two times a day (BID) | ORAL | 0 refills | Status: DC

## 2023-06-25 NOTE — Patient Instructions (Signed)
 Peroneal Tendinopathy Rehab Ask your health care provider which exercises are safe for you. Do exercises exactly as told by your health care provider and adjust them as directed. It is normal to feel mild stretching, pulling, tightness, or discomfort as you do these exercises. Stop right away if you feel sudden pain or your pain gets worse. Do not begin these exercises until told by your health care provider. Stretching and range-of-motion exercises These exercises warm up your muscles and joints. They can help improve the movement and flexibility of your ankle. They may also help to relieve pain and stiffness. Gastrocnemius and soleus stretch, standing This is an exercise in which you stand on a step and use your body weight to stretch your calf muscles. To do this exercise: Stand on the edge of a step on the ball of your left / right foot. The ball of your foot is on the walking surface, right under your toes. Keep your other foot firmly on the same step. Hold on to the wall, a railing, or a chair for balance. Slowly lift your other foot, allowing your body weight to press your left / right heel down over the edge of the step. You should feel a stretch in your left / right calf (gastrocnemius and soleus). Hold this position for __________ seconds. Return both feet to the step. Repeat this exercise with a slight bend in your left / right knee. Repeat __________ times with your left / right knee straight and __________ times with your left / right knee bent. Complete this exercise __________ times a day. Strengthening exercises These exercises build strength and endurance in your foot and ankle. Endurance is the ability to use your muscles for a long time, even after they get tired. Ankle dorsiflexion with band  Secure a rubber exercise band or tube to an object, such as a table leg, that will not move when the band is pulled. Secure the other end of the band around your left / right foot. Sit on  the floor. Face the object with your left / right leg extended. The band or tube should be slightly tense when your foot is relaxed. Slowly flex your left / right ankle and toes to bring your foot toward you (dorsiflexion). Hold this position for __________ seconds. Let the band or tube slowly pull your foot back to the starting position. Repeat __________ times. Complete this exercise __________ times a day. Ankle eversion  Sit on the floor with your legs straight out in front of you. Loop a rubber exercise band or tube around the ball of your left / right foot. The ball of your foot is on the walking surface, right under your toes. Hold the ends of the band in your hands. You can also secure the band to a stable object. The band or tube should be slightly tense when your foot is relaxed. Slowly push your foot outward, away from your other leg (eversion). Hold this position for __________ seconds. Slowly return your foot to the starting position. Repeat __________ times. Complete this exercise __________ times a day. Plantar flexion, standing This exercise is sometimes called a standing heel raise. Stand with your feet shoulder-width apart. Place your hands on a wall or table to steady yourself as needed. Try not to use it for support. Keep your weight spread evenly over the width of your feet while you slowly rise up on your toes (plantar flexion). If told by your health care provider: Shift your weight  toward your left / right leg until you feel challenged. Stand on your left / right leg only. Hold this position for __________ seconds. Repeat __________ times. Complete this exercise __________ times a day. Single leg stand  Without shoes, stand near a railing or in a doorway. You may hold on to the railing or doorframe as needed. Stand on your left / right foot. Keep your big toe down on the floor and try to keep your arch lifted. Do not roll to the outside of your foot. If this  exercise is too easy, you can try it with your eyes closed or while standing on a pillow. Hold this position for __________ seconds. Repeat __________ times. Complete this exercise __________ times a day. This information is not intended to replace advice given to you by your health care provider. Make sure you discuss any questions you have with your health care provider. Document Revised: 04/21/2021 Document Reviewed: 04/21/2021 Elsevier Patient Education  2024 [employer].

## 2023-06-25 NOTE — Progress Notes (Signed)
  Subjective:  Patient ID: Gabriella Acosta, female    DOB: 11-Aug-1945,   MRN: [id number]  Chief Complaint  Patient presents with   Ankle Pain    Rm6 / right ankle / pain/ 2-3 months/ support brace no relief/diabetic/     78 y.o. female presents for concern of right ankle pain that has been ongoing for about 2-3 months. Relates she has tried a support brace but this has not helped. Relates pain on the outside of the ankle and pain when walking. She denies any other treatments Relates burning and tingling in their feet. Patient is diabetic and last A1c was  Lab Results  Component Value Date   HGBA1C 8.1 (H) 04/18/2023   .   PCP:  [name], FNP    . Denies any other pedal complaints. Denies n/v/f/c.   Past Medical History:  Diagnosis Date   Diabetes mellitus without complication (HCC)    Hypertension    Type 2 diabetes mellitus (HCC) 07/22/2020    Objective:  Physical Exam: Vascular: DP/PT pulses 2/4 bilateral. CFT <3 seconds. Normal hair growth on digits. No edema.  Skin. No lacerations or abrasions bilateral feet.  Musculoskeletal: MMT 5/5 bilateral lower extremities in DF, PF, Inversion and Eversion. Deceased ROM in DF of ankle joint. Pes cavus noted severe ont he right foot. Tender to distal peroneal tendon just proximal to insertion and some pain tracking proximally along the tendon. Some mild tenderness to the ATFL and CFL areas as well.  Neurological: Sensation intact to light touch.   Assessment:   1. Pes cavus of right foot   2. Peroneal tendonitis, right      Plan:  Patient was evaluated and treated and all questions answered. X-rays reviewed and discussed with patient. No acute fractures or dislocations noted. Severe pes cavus noted.  Discussed peroneal tendinitis and treatment options at length with patient Discussed stretching exercises and provided handout. Prescription for diclofenac  provided. Has had no help with meloxicam  in the past. Cr has been low  in the past will only plan for one month of NSAID.  Dispensed Tri-Lock ankle brace. Discussed that if the symptoms do not improve can consider PT/MRI. Patient to return in 6 to 8 weeks or sooner if symptoms fail to improve or worsen.   Jennefer Moats, DPM

## 2023-06-26 ENCOUNTER — Other Ambulatory Visit: Payer: Medicare PPO

## 2023-07-10 ENCOUNTER — Other Ambulatory Visit (HOSPITAL_BASED_OUTPATIENT_CLINIC_OR_DEPARTMENT_OTHER)

## 2023-07-16 ENCOUNTER — Other Ambulatory Visit: Payer: Self-pay | Admitting: Nurse Practitioner

## 2023-07-16 DIAGNOSIS — E782 Mixed hyperlipidemia: Secondary | ICD-10-CM

## 2023-07-19 ENCOUNTER — Telehealth: Payer: Self-pay

## 2023-07-19 DIAGNOSIS — E119 Type 2 diabetes mellitus without complications: Secondary | ICD-10-CM

## 2023-07-19 MED ORDER — LANTUS SOLOSTAR 100 UNIT/ML ~~LOC~~ SOPN: PEN_INJECTOR | SUBCUTANEOUS | 0 refills | Status: DC

## 2023-07-19 NOTE — Telephone Encounter (Signed)
 Contact patient to scheduled follow up.

## 2023-07-24 ENCOUNTER — Ambulatory Visit: Admitting: Nurse Practitioner

## 2023-07-24 NOTE — Progress Notes (Deleted)
 LILLETTE Kristeen JINNY Gladis, CMA,acting as a Neurosurgeon for Gaines Ada, FNP.,have documented all relevant documentation on the behalf of Gaines Ada, FNP,as directed by  Gaines Ada, FNP while in the presence of Gaines Ada, FNP.  Subjective:  Patient ID: Gabriella Acosta , female    DOB: 1945-06-20 , 78 y.o.   MRN: [id number]  No chief complaint on file.   HPI  HPI   Past Medical History:  Diagnosis Date   Diabetes mellitus without complication (HCC)    Hypertension    Type 2 diabetes mellitus (HCC) 07/22/2020     Family History  Problem Relation Age of Onset   Diabetes Mother    Diabetes Father    Heart disease Father    Breast cancer Neg Hx      Current Outpatient Medications:    amLODipine  (NORVASC ) 5 MG tablet, TAKE 1 TABLET BY MOUTH BEFORE BREAKFAST, Disp: 90 tablet, Rfl: 2   atorvastatin  (LIPITOR) 80 MG tablet, TAKE 1 TABLET BY MOUTH EVERY DAY BEFORE BREAKFAST, Disp: 30 tablet, Rfl: 1   Blood Glucose Monitoring Suppl DEVI, Check blood sugars twice daily. E11.9, Disp: 1 kit, Rfl: 0   diclofenac  (VOLTAREN ) 75 MG EC tablet, Take 1 tablet (75 mg total) by mouth 2 (two) times daily., Disp: 60 tablet, Rfl: 0   empagliflozin  (JARDIANCE ) 25 MG TABS tablet, TAKE 1 TABLET(25 MG) BY MOUTH DAILY BEFORE BREAKFAST, Disp: 90 tablet, Rfl: 1   Injection Device for Insulin  (CEQUR SIMPLICITY INSERTER) MISC, 1 Device by Does not apply route daily in the afternoon., Disp: 1 each, Rfl: 0   insulin  glargine (LANTUS  SOLOSTAR) 100 UNIT/ML Solostar Pen, INJECT 40 UNITS INTO THE SKIN DAILY, Disp: 15 mL, Rfl: 0   Insulin  Pen Needle (BD PEN NEEDLE MICRO U/F) 32G X 6 MM MISC, USE AS DIRECTED ONCE DAILY EVERY EVENING, Disp: 100 each, Rfl: 3   Insulin  Pen Needle 32G X 4 MM MISC, 1 Device by Does not apply route daily in the afternoon., Disp: 100 each, Rfl: 3   losartan  (COZAAR ) 25 MG tablet, Take 1 tablet (25 mg total) by mouth daily. (Patient not taking: Reported on 06/25/2023), Disp: 30 tablet, Rfl: 2    losartan  (COZAAR ) 50 MG tablet, Take 50 mg by mouth daily. (Patient not taking: Reported on 06/25/2023), Disp: , Rfl:    magnesium 30 MG tablet, Take 30 mg by mouth once a week., Disp: , Rfl:    meloxicam  (MOBIC ) 7.5 MG tablet, TAKE 1 TABLET(7.5 MG) BY MOUTH DAILY, Disp: 30 tablet, Rfl: 0   methocarbamol  (ROBAXIN ) 500 MG tablet, Take 1 tablet (500 mg total) by mouth 2 (two) times daily., Disp: 20 tablet, Rfl: 0   Multiple Vitamins-Minerals (MULTIVITAMIN WITH MINERALS) tablet, Take 1 tablet by mouth daily., Disp: , Rfl:    pioglitazone  (ACTOS ) 30 MG tablet, Take 1 tablet (30 mg total) by mouth daily., Disp: 90 tablet, Rfl: 3   potassium chloride  (KLOR-CON  M) 10 MEQ tablet, TAKE 1 TABLET BY MOUTH EVERY MORNING, Disp: 30 tablet, Rfl: 1   VASCEPA  1 g capsule, TAKE 2 CAPSULES(2 GRAMS) BY MOUTH TWICE DAILY, Disp: 120 capsule, Rfl: 3   No Known Allergies   Review of Systems   There were no vitals filed for this visit. There is no height or weight on file to calculate BMI.  Wt Readings from Last 3 Encounters:  06/06/23 126 lb 9.6 oz (57.4 kg)  04/18/23 131 lb 6.4 oz (59.6 kg)  12/19/22 133 lb 6.4 oz (60.5 kg)  The 10-year ASCVD risk score (Arnett DK, et al., 2019) is: 60.1%   Values used to calculate the score:     Age: 11 years     Clincally relevant sex: Female     Is Non-Hispanic African American: Yes     Diabetic: Yes     Tobacco smoker: Yes     Systolic Blood Pressure: 120 mmHg     Is BP treated: Yes     HDL Cholesterol: 72 mg/dL     Total Cholesterol: 214 mg/dL  Objective:  Physical Exam      Assessment And Plan:  There are no diagnoses linked to this encounter.  No follow-ups on file.  Patient was given opportunity to ask questions. Patient verbalized understanding of the plan and was able to repeat key elements of the plan. All questions were answered to their satisfaction.    LILLETTE Gaines Ada, FNP, have reviewed all documentation for this visit. The documentation on  07/24/23 for the exam, diagnosis, procedures, and orders are all accurate and complete.   IF YOU HAVE BEEN REFERRED TO A SPECIALIST, IT MAY TAKE 1-2 WEEKS TO SCHEDULE/PROCESS THE REFERRAL. IF YOU HAVE NOT HEARD FROM US /SPECIALIST IN TWO WEEKS, PLEASE GIVE US  A CALL AT [phone].

## 2023-07-30 ENCOUNTER — Telehealth: Payer: Self-pay | Admitting: Pharmacist

## 2023-07-30 DIAGNOSIS — I10 Essential (primary) hypertension: Secondary | ICD-10-CM

## 2023-07-30 NOTE — Progress Notes (Signed)
   07/30/2023  Patient ID: Gabriella Acosta, female   DOB: [birth year], 78 y.o.   MRN: [id number]  Pharmacy Quality Measure Review  This patient is appearing on a report for being at risk of failing the adherence measure for hypertension (ACEi/ARB) medications this calendar year.   Medication: Losartan   Last fill date: 05/15/2023 for 30 day supply  Left voicemail for patient to return my call at their convenience.  Reviewed chart, patient stopped taking Losartan  because it caused light headednesss.  She does not have an appointment to see her PCP until 04/26.  Will not pass measure.  Will follow up with Patient in 3 weeks on her blood pressure and to see if she needs to schedule another appointment with PCP as she missed her last one.   Cassius DOROTHA Brought, PharmD, BCACP Clinical Pharmacist [phone]

## 2023-07-31 ENCOUNTER — Encounter: Payer: Self-pay | Admitting: Pharmacist

## 2023-07-31 NOTE — Progress Notes (Signed)
   07/31/2023  Patient ID: Gabriella Acosta, female   DOB: October 12, 1945, 78 y.o.   MRN: [id number]  Pharmacy Quality Measure Review  This patient is appearing on a report for being at risk of failing the adherence measure for cholesterol (statin) medications this calendar year.   Medication: atorvastatin  80 mg Last fill date: 07/16/23 for 30 day supply  Insurance report was not up to date. No action needed at this time.  Will call Patient back in 1 month about getting 90 day supplies (left message for the Patient yesterday.)  Cassius DOROTHA Brought, PharmD, [facility] Clinical Pharmacist [phone]

## 2023-08-06 ENCOUNTER — Ambulatory Visit (INDEPENDENT_AMBULATORY_CARE_PROVIDER_SITE_OTHER): Admitting: Podiatry

## 2023-08-06 DIAGNOSIS — Z91199 Patient's noncompliance with other medical treatment and regimen due to unspecified reason: Secondary | ICD-10-CM

## 2023-08-06 NOTE — Progress Notes (Signed)
 No show

## 2023-08-07 ENCOUNTER — Encounter: Payer: Self-pay | Admitting: Internal Medicine

## 2023-08-07 ENCOUNTER — Ambulatory Visit (INDEPENDENT_AMBULATORY_CARE_PROVIDER_SITE_OTHER): Admitting: Internal Medicine

## 2023-08-07 VITALS — BP 120/70 | HR 81 | Ht 62.0 in | Wt 125.0 lb

## 2023-08-07 DIAGNOSIS — Z7984 Long term (current) use of oral hypoglycemic drugs: Secondary | ICD-10-CM

## 2023-08-07 DIAGNOSIS — Z794 Long term (current) use of insulin: Secondary | ICD-10-CM

## 2023-08-07 DIAGNOSIS — E1165 Type 2 diabetes mellitus with hyperglycemia: Secondary | ICD-10-CM

## 2023-08-07 LAB — POCT GLYCOSYLATED HEMOGLOBIN (HGB A1C): Hemoglobin A1C: 7.6 % — AB (ref 4.0–5.6)

## 2023-08-07 LAB — POCT GLUCOSE (DEVICE FOR HOME USE): POC Glucose: 131 mg/dL — AB (ref 70–99)

## 2023-08-07 MED ORDER — GLIPIZIDE 5 MG PO TABS: 5.0000 mg | ORAL_TABLET | Freq: Every day | ORAL | 3 refills | Status: AC

## 2023-08-07 MED ORDER — LANTUS SOLOSTAR 100 UNIT/ML ~~LOC~~ SOPN: 26.0000 [IU] | PEN_INJECTOR | Freq: Every day | SUBCUTANEOUS | 3 refills | Status: DC

## 2023-08-07 MED ORDER — EMPAGLIFLOZIN 25 MG PO TABS: 25.0000 mg | ORAL_TABLET | Freq: Every day | ORAL | 3 refills | Status: AC

## 2023-08-07 MED ORDER — PIOGLITAZONE HCL 30 MG PO TABS: 30.0000 mg | ORAL_TABLET | Freq: Every day | ORAL | 3 refills | Status: AC

## 2023-08-07 MED ORDER — INSULIN PEN NEEDLE 32G X 4 MM MISC: 1.0000 | Freq: Every day | 3 refills | Status: AC
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
## 2023-08-07 NOTE — Patient Instructions (Signed)
-   Start Glipizide  5 mg, 1 tablet before the first meal of the day  - Continue Lantus  26 units daily  - Continue  Jardiance  25 mg daily  - Continue Pioglitazone  30 mg daily           HOW TO TREAT LOW BLOOD SUGARS (Blood sugar LESS THAN 70 MG/DL) Please follow the RULE OF 15 for the treatment of hypoglycemia treatment (when your (blood sugars are less than 70 mg/dL)   STEP 1: Take 15 grams of carbohydrates when your blood sugar is low, which includes:  3-4 GLUCOSE TABS  OR 3-4 OZ OF JUICE OR REGULAR SODA OR ONE TUBE OF GLUCOSE GEL    STEP 2: RECHECK blood sugar in 15 MINUTES STEP 3: If your blood sugar is still low at the 15 minute recheck --> then, go back to STEP 1 and treat AGAIN with another 15 grams of carbohydrates.

## 2023-08-08 ENCOUNTER — Other Ambulatory Visit: Payer: Self-pay

## 2023-08-08 DIAGNOSIS — E119 Type 2 diabetes mellitus without complications: Secondary | ICD-10-CM

## 2023-08-08 MED ORDER — LANTUS SOLOSTAR 100 UNIT/ML ~~LOC~~ SOPN: PEN_INJECTOR | SUBCUTANEOUS | 3 refills | Status: AC

## 2023-08-09 ENCOUNTER — Other Ambulatory Visit: Payer: Self-pay | Admitting: Nurse Practitioner

## 2023-08-09 DIAGNOSIS — E782 Mixed hyperlipidemia: Secondary | ICD-10-CM

## 2023-08-20 ENCOUNTER — Telehealth: Payer: Self-pay | Admitting: Pharmacist

## 2023-08-20 DIAGNOSIS — E785 Hyperlipidemia, unspecified: Secondary | ICD-10-CM

## 2023-08-20 DIAGNOSIS — E782 Mixed hyperlipidemia: Secondary | ICD-10-CM

## 2023-08-20 MED ORDER — ATORVASTATIN CALCIUM 80 MG PO TABS: ORAL_TABLET | ORAL | 2 refills | Status: DC

## 2023-08-20 NOTE — Progress Notes (Addendum)
   08/20/2023  Patient ID: Gabriella Acosta, female   DOB: 1945-08-09, 78 y.o.   MRN: [id number]  Pharmacy Quality Measure Review  This patient is appearing on a report for being at risk of failing the adherence measure for cholesterol (statin) medications this calendar year.   Medication: Atorvastatin  80 mg  Last fill date: 08/09/23 for 30 day supply  Will collaborate with provider to facilitate refill needs.  Refill reorder to be sent co-signature required for a 90 day supply.   Called Patient to discuss her other medications.  HIPAA identifiers were obtained.  Patient confirmed she is not taking losartan .  She is taking amlodipine  for blood pressure.  Her last B/P was 120/70 at her Endocrinology visit.   New HgA1c 7.6%--She was started on glipizide  5 mg daily but stopped it because she said it caused her blood sugar to go too low. She reported her blood sugar was 60 mg/dl and that she treated the blood sugar by drinking orange juice and then rechecking hr blood sugar when.  Patient said she reported the low blood sugar to Dr. Kris office. She reported taking the glipizide  with food.   An attempt was made to get the Patient scheduled to come in for a visit but she declined.     Plan:  Follow up with the Patient in 2 weeks to check on blood sugars.   Cassius DOROTHA Brought, PharmD, BCACP Clinical Pharmacist [phone]

## 2023-09-04 ENCOUNTER — Telehealth: Payer: Self-pay | Admitting: Pharmacist

## 2023-09-04 DIAGNOSIS — G8929 Other chronic pain: Secondary | ICD-10-CM

## 2023-09-04 DIAGNOSIS — M25551 Pain in right hip: Secondary | ICD-10-CM

## 2023-09-04 DIAGNOSIS — E1165 Type 2 diabetes mellitus with hyperglycemia: Secondary | ICD-10-CM

## 2023-09-04 MED ORDER — MELOXICAM 7.5 MG PO TABS: 7.5000 mg | ORAL_TABLET | Freq: Every day | ORAL | 0 refills | Status: DC

## 2023-09-04 NOTE — Progress Notes (Signed)
 09/04/2023  Patient ID: Gabriella Acosta, female   DOB: [birth year], 77 y.o.   MRN: [id number]  Patient was called to follow up on her blood sugars as she reported stopping glipizide  due to a low blood sugar of 60 mg/dl right when she started it. HIPAA identifiers were obtained.  Gabriella Acosta is a 78 year old female with multiple medical conditions including but not limited to:  type 2 diabetes,hyperlipidemia, hypertension, and chronic pain in right knee and hip.  Her medications were reviewed via telephone:  Medications Reviewed Today     Reviewed by Jolee Cassius PARAS, [facility] (Pharmacist) on 09/04/23 at 1403  Med List Status: <None>   Medication Order Taking? Sig Documenting Provider Last Dose Status Informant  amLODipine  (NORVASC ) 5 MG tablet [id number] Yes TAKE 1 TABLET BY MOUTH BEFORE [name], FNP  Active   atorvastatin  (LIPITOR) 80 MG tablet [id number] Yes TAKE 1 TABLET BY MOUTH EVERY DAY BEFORE [name], FNP  Active   Blood Glucose Monitoring Suppl DEVI [id number] Yes Check blood sugars twice daily. E11.9 [name], FNP  Active Self  empagliflozin  (JARDIANCE ) 25 MG TABS tablet [id number] Yes Take 1 tablet (25 mg total) by mouth daily. Shamleffer, Ibtehal Jaralla, MD  Active   glipiZIDE  (GLUCOTROL ) 5 MG tablet [id number]  Take 1 tablet (5 mg total) by mouth daily before breakfast.  Patient not taking: Reported on 09/04/2023   Shamleffer, Ibtehal Jaralla, MD  Active   insulin  glargine (LANTUS  SOLOSTAR) 100 UNIT/ML Solostar Pen [id number] Yes Inject 26 Units into the skin daily. Shamleffer, Ibtehal Jaralla, MD  Active   Insulin  Pen Needle (BD PEN NEEDLE MICRO U/F) 32G X 6 MM MISC [id number] Yes USE AS DIRECTED ONCE DAILY EVERY EVENING Shamleffer, Ibtehal Jaralla, MD  Active   Insulin  Pen Needle 32G X 4 MM MISC [id number] Yes 1 Device by Does not apply route daily in the afternoon. Shamleffer, Donell Cardinal, MD  Active    Patient not taking:   Discontinued  09/04/23 1400 (Patient Preference)    Patient not taking:   Discontinued 09/04/23 1400 (Patient Preference)   magnesium 30 MG tablet [id number] Yes Take 30 mg by mouth once a week. [provider]  Active Self  meloxicam  (MOBIC ) 7.5 MG tablet [id number] Yes TAKE 1 TABLET(7.5 MG) BY MOUTH DAILY [name], FNP  Active   methocarbamol  (ROBAXIN ) 500 MG tablet [id number] Yes Take 1 tablet (500 mg total) by mouth 2 (two) times daily.  Patient taking differently: Take 500 mg by mouth 2 (two) times daily. PRN   Barrett, Jamie N, PA-C  Active   Multiple Vitamins-Minerals (MULTIVITAMIN WITH MINERALS) tablet [id number] Yes Take 1 tablet by mouth daily. [provider]  Active Self  pioglitazone  (ACTOS ) 30 MG tablet [id number] Yes Take 1 tablet (30 mg total) by mouth daily. Shamleffer, Donell Cardinal, MD  Active   potassium chloride  (KLOR-CON  M) 10 MEQ tablet [id number] Yes TAKE 1 TABLET BY MOUTH EVERY MORNING [name], FNP  Active   VASCEPA  1 g capsule [id number]  TAKE 2 CAPSULES(2 GRAMS) BY MOUTH TWICE DAILY  Patient not taking: Reported on 09/04/2023   [name], MD  Active            Patient's HgA1c- 7.6% 08/07/23  Does not check her blood sugar daily--only checks if there is a problem.  Stopped taking Glipizide  because she said it caused her blood sugar to be too low.  On: Jardiance  25 mg 1 tablet daily -last  filled 08/07/23 90 day supply with 62% adherence Lantus   26 units daily   Pioglitazone  30 mg 1 tablet daily #90 last filled 07/21/23 other than this...not filled this year.  HTN- B/P at Endo visit 120/70 Amlodipine  5 mg 1 tablet daily Not taking Losartan  due to it making her feel light headed but patient's microalbumin/Creat Ratio was elevated (41)   LDL -114  TG 163--On Atorvastatin  80 mg - last filled 09/04/23 for a 90 day supply but this was a late refill as it was filled prior to this date on 07/16/23-new prescription for 90 day supply should be sent  to the Patient's Pharmacy.  Not taking Asclepia  Lipid Panel     Component Value Date/Time   CHOL 214 (H) 04/18/2023 1603   TRIG 163 (H) 04/18/2023 1603   HDL 72 04/18/2023 1603   CHOLHDL 3.0 04/18/2023 1603   CHOLHDL 3 12/09/2020 0856   VLDL 34.8 12/09/2020 0856   LDLCALC 114 (H) 04/18/2023 1603   LABVLDL 28 04/18/2023 1603    Patient asked for a refill on meloxicam  due to continued inflammation of knee and hip.  I reached out to Gaines Ada, FNP and she approved the refill which will be sent cosignature required.   Plan: Follow up with Patient in 3-4 weeks.   Cassius DOROTHA Brought, PharmD, BCACP Clinical Pharmacist [phone]

## 2023-10-23 ENCOUNTER — Other Ambulatory Visit: Payer: Self-pay | Admitting: Nurse Practitioner

## 2023-10-23 ENCOUNTER — Other Ambulatory Visit: Payer: Self-pay | Admitting: Podiatry

## 2023-10-23 DIAGNOSIS — G8929 Other chronic pain: Secondary | ICD-10-CM

## 2023-10-29 ENCOUNTER — Telehealth: Payer: Self-pay | Admitting: Pharmacist

## 2023-10-29 DIAGNOSIS — E1100 Type 2 diabetes mellitus with hyperosmolarity without nonketotic hyperglycemic-hyperosmolar coma (NKHHC): Secondary | ICD-10-CM

## 2023-10-29 NOTE — Progress Notes (Signed)
 10/29/2023  Patient ID: Gabriella Acosta, female   DOB: 1945/05/03, 78 y.o.   MRN: [id number]  Patient was called to follow up.  HIPAA identifiers were obtained.  Patient is a 78 year old female with multiple medical conditions including: hyperlipidemia, hypertension, type 2 diabetes, and anxiety.    Medications were reviewed:  Medications Reviewed Today     Reviewed by Gabriella Acosta, [facility] (Pharmacist) on 10/29/23 at 1551  Med List Status: <None>   Medication Order Taking? Sig Documenting Provider Last Dose Status Informant  amLODipine  (NORVASC ) 5 MG tablet [id number] Yes TAKE 1 TABLET BY MOUTH BEFORE Gabriella [name], FNP  Active   atorvastatin  (LIPITOR) 80 MG tablet [id number] Yes TAKE 1 TABLET BY MOUTH EVERY DAY BEFORE Gabriella [name], FNP  Active   Blood Glucose Monitoring Suppl DEVI [id number] Yes Check blood sugars twice daily. E11.9 [name], FNP  Active Self  diclofenac  (VOLTAREN ) 75 MG EC tablet [id number] Yes TAKE 1 TABLET(75 MG) BY MOUTH TWICE DAILY Acosta, Gabriella, DPM  Active   empagliflozin  (JARDIANCE ) 25 MG TABS tablet [id number] Yes Take 1 tablet (25 mg total) by mouth daily. Acosta, Gabriella Jaralla, MD  Active   glipiZIDE  (GLUCOTROL ) 5 MG tablet [id number] Yes Take 1 tablet (5 mg total) by mouth daily before breakfast. Acosta, Gabriella Jaralla, MD  Active   insulin  glargine (LANTUS  SOLOSTAR) 100 UNIT/ML Solostar Pen [id number] Yes Inject 26 Units into the skin daily. Acosta, Gabriella Cardinal, MD  Active   Insulin  Pen Needle (BD PEN NEEDLE MICRO U/F) 32G X 6 MM MISC [id number] Yes USE AS DIRECTED ONCE DAILY EVERY EVENING Acosta, Gabriella Jaralla, MD  Active   Insulin  Pen Needle 32G X 4 MM MISC [id number] Yes 1 Device by Does not apply route daily in the afternoon. Acosta, Gabriella Jaralla, MD  Active   magnesium 30 MG tablet [id number]  Take 30 mg by mouth once a week. [provider]  Active Self  meloxicam  (MOBIC ) 7.5 MG tablet [id number]  Yes TAKE 1 TABLET(7.5 MG) BY MOUTH DAILY [name], FNP  Active   methocarbamol  (ROBAXIN ) 500 MG tablet [id number] Yes Take 1 tablet (500 mg total) by mouth 2 (two) times daily. Acosta, Gabriella SAILOR, PA-C  Active   Multiple Vitamins-Minerals (MULTIVITAMIN WITH MINERALS) tablet [id number] Yes Take 1 tablet by mouth daily. [provider]  Active Self  pioglitazone  (ACTOS ) 30 MG tablet [id number] Yes Take 1 tablet (30 mg total) by mouth daily. Acosta, Gabriella Cardinal, MD  Active   potassium chloride  (KLOR-CON  M) 10 MEQ tablet [id number]  TAKE 1 TABLET BY MOUTH EVERY MORNING  Patient not taking: Reported on 10/29/2023   [name], FNP  Active   VASCEPA  1 g capsule [id number]  TAKE 2 CAPSULES(2 GRAMS) BY MOUTH TWICE DAILY  Patient not taking: Reported on 10/29/2023   Gabriella Vinie BROCKS, MD  Active            Lab Results  Component Value Date   HGBA1C 7.6 (A) 08/07/2023   HGBA1C 8.1 (H) 04/18/2023   HGBA1C 6.3 (H) 11/09/2022       08/07/2023   12:19 PM 06/06/2023    2:58 PM 04/18/2023    3:14 PM  Vitals with BMI  Height 5' 2 5' 2 5' 2  Weight 125 lbs 126 lbs 10 oz 131 lbs 6 oz  BMI 22.86 23.15 24.03  Systolic 120 120 879  Diastolic 70 70 80  Pulse 81 85 57     Lipid Panel  Component Value Date/Time   CHOL 214 (H) 04/18/2023 1603   TRIG 163 (H) 04/18/2023 1603   HDL 72 04/18/2023 1603   CHOLHDL 3.0 04/18/2023 1603   CHOLHDL 3 12/09/2020 0856   VLDL 34.8 12/09/2020 0856   LDLCALC 114 (H) 04/18/2023 1603   LABVLDL 28 04/18/2023 1603    On statin therapy:  Atorvastatin  80 mg 1 tablet daily   Upcoming appointment 11/07/23.  Will need labs.     Gabriella Acosta, PharmD, BCACP Clinical Pharmacist [phone]   .

## 2023-11-06 ENCOUNTER — Telehealth: Payer: Self-pay

## 2023-11-06 NOTE — Telephone Encounter (Signed)
 Patient called and left a message, stating her ankle pain is worsening. She was last seen in June, and no-showed in July. She needs an appointment . Please call to schedule. thanks

## 2023-11-07 ENCOUNTER — Ambulatory Visit: Payer: Medicare PPO

## 2023-11-07 ENCOUNTER — Ambulatory Visit: Admitting: Podiatry

## 2023-11-07 ENCOUNTER — Ambulatory Visit (INDEPENDENT_AMBULATORY_CARE_PROVIDER_SITE_OTHER): Admitting: Nurse Practitioner

## 2023-11-07 ENCOUNTER — Encounter: Payer: Self-pay | Admitting: Podiatry

## 2023-11-07 ENCOUNTER — Encounter: Payer: Self-pay | Admitting: Nurse Practitioner

## 2023-11-07 VITALS — BP 130/60 | HR 79 | Temp 98.4°F | Ht 62.0 in | Wt 129.6 lb

## 2023-11-07 DIAGNOSIS — Q6671 Congenital pes cavus, right foot: Secondary | ICD-10-CM | POA: Diagnosis not present

## 2023-11-07 DIAGNOSIS — I10 Essential (primary) hypertension: Secondary | ICD-10-CM

## 2023-11-07 DIAGNOSIS — R202 Paresthesia of skin: Secondary | ICD-10-CM | POA: Diagnosis not present

## 2023-11-07 DIAGNOSIS — Z23 Encounter for immunization: Secondary | ICD-10-CM | POA: Diagnosis not present

## 2023-11-07 DIAGNOSIS — E782 Mixed hyperlipidemia: Secondary | ICD-10-CM

## 2023-11-07 DIAGNOSIS — E1165 Type 2 diabetes mellitus with hyperglycemia: Secondary | ICD-10-CM | POA: Diagnosis not present

## 2023-11-07 DIAGNOSIS — R200 Anesthesia of skin: Secondary | ICD-10-CM

## 2023-11-07 DIAGNOSIS — Z720 Tobacco use: Secondary | ICD-10-CM

## 2023-11-07 DIAGNOSIS — M7671 Peroneal tendinitis, right leg: Secondary | ICD-10-CM | POA: Diagnosis not present

## 2023-11-07 NOTE — Progress Notes (Signed)
  Subjective:  Patient ID: Gabriella Acosta, female    DOB: January 22, 1945,   MRN: [id number]  Chief Complaint  Patient presents with   Ankle Pain    R ankle pain has not improved with brace. ankle is turning outwards with brace.  Diabetic A1c 7.6. no anti coag    78 y.o. female presents for follow-up of right peroneal tendinitis.  Relates the ankle pain has not improved.  She did miss her last follow-up and has been dealing with the pain.  She relates her ankle still turning out and is not comfortable in the brace.  Patient is diabetic and last A1c was  Lab Results  Component Value Date   HGBA1C 7.6 (A) 08/07/2023   .   PCP:  [name], FNP    . Denies any other pedal complaints. Denies n/v/f/c.   Past Medical History:  Diagnosis Date   Diabetes mellitus without complication (HCC)    Hypertension    Type 2 diabetes mellitus (HCC) 07/22/2020    Objective:  Physical Exam: Vascular: DP/PT pulses 2/4 bilateral. CFT <3 seconds. Normal hair growth on digits. No edema.  Skin. No lacerations or abrasions bilateral feet.  Musculoskeletal: MMT 5/5 bilateral lower extremities in DF, PF, Inversion and Eversion. Deceased ROM in DF of ankle joint. Pes cavus noted severe ont he right foot. Tender to distal peroneal tendon just proximal to insertion and some pain tracking proximally along the tendon. Some mild tenderness to the ATFL and CFL areas as well.  Neurological: Sensation intact to light touch.   Assessment:   1. Peroneal tendonitis, right   2. Pes cavus of right foot       Plan:  Patient was evaluated and treated and all questions answered. X-rays reviewed and discussed with patient. No acute fractures or dislocations noted. Severe pes cavus noted.  Discussed peroneal tendinitis and treatment options at length with patient Continue stretching.  Ambulatory referral to PT placed Continue diclofenac  as needed sparingly. Cam boot dispensed to offload the area and reduce  inflammation. Discussed long-term looking into an [place]  brace.  Will schedule appointment with Sueanne to evaluate. Discussed that if the symptoms do not improve can consider PT/MRI. Patient to return in 6 to 8 weeks or sooner if symptoms fail to improve or worsen.   Asberry Failing, DPM

## 2023-11-07 NOTE — Progress Notes (Signed)
 LILLETTE Kristeen JINNY Gladis, CMA,acting as a neurosurgeon for Gaines Ada, FNP.,have documented all relevant documentation on the behalf of Gaines Ada, FNP,as directed by  Gaines Ada, FNP while in the presence of Gaines Ada, FNP.  Subjective:  Patient ID: Gabriella Acosta , female    DOB: [birth year] , 78 y.o.   MRN: [id number]  Chief Complaint  Patient presents with   Hypertension    Patient presents today for a bp and dm follow up, Patient reports compliance with medication. Patient denies any chest pain, SOB, or headaches. Patient has no concerns today.    Numbness    Patient reports both her hands and numb and tingling for the past few weeks.     HPI Discussed the use of AI scribe software for clinical note transcription with the patient, who gave verbal consent to proceed.  History of Present Illness Gabriella Acosta is a 78 year old female with hypertension and diabetes who presents for follow-up on blood pressure management and diabetes care.  She did not take her blood pressure medication today due to an early doctor's appointment for her ankle, resulting in elevated blood pressure during the visit.  For diabetes management, she last saw her endocrinologist in July and is scheduled to follow up in December. She is currently taking Lantus  26 units, Jardiance  25 mg daily, and pioglitazone  30 mg. She was prescribed glipizide  5 mg in July but discontinued it due to adverse effects, stating it made her sick.  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 She has a history of smoking, currently smoking less than four cigarettes daily due to pain.  She mentions having a business in alterations, which involves repetitive hand movements, potentially contributing to her symptoms. She also reports having a boot placed on her foot today, which she removed because it interfered with her ability to drive.   She has  not taken her blood pressure medications today yet. She seen the orthopedic this morning for her right ankle. She just got a boot placed to her right ankle.     Past Medical History:  Diagnosis Date   Diabetes mellitus without complication (HCC)    Hypertension    Type 2 diabetes mellitus (HCC) 07/22/2020     Family History  Problem Relation Age of Onset   Diabetes Mother    Diabetes Father    Heart disease Father    Breast cancer Neg Hx      Current Outpatient Medications:    amLODipine  (NORVASC ) 5 MG tablet, TAKE 1 TABLET BY MOUTH BEFORE BREAKFAST, Disp: 90 tablet, Rfl: 2   atorvastatin  (LIPITOR) 80 MG tablet, TAKE 1 TABLET BY MOUTH EVERY DAY BEFORE BREAKFAST, Disp: 90 tablet, Rfl: 2   Blood Glucose Monitoring Suppl DEVI, Check blood sugars twice daily. E11.9, Disp: 1 kit, Rfl: 0   diclofenac  (VOLTAREN ) 75 MG EC tablet, TAKE 1 TABLET(75 MG) BY MOUTH TWICE DAILY, Disp: 60 tablet, Rfl: 0   empagliflozin  (JARDIANCE ) 25 MG TABS tablet, Take 1 tablet (25 mg total) by mouth daily., Disp: 90 tablet, Rfl: 3   glipiZIDE  (GLUCOTROL ) 5 MG tablet, Take 1 tablet (5 mg total) by mouth daily before breakfast., Disp: 90 tablet, Rfl: 3   insulin  glargine (LANTUS  SOLOSTAR) 100 UNIT/ML Solostar Pen, Inject 26 Units into the skin daily., Disp: 30 mL, Rfl: 3   Insulin  Pen Needle (BD PEN NEEDLE MICRO U/F) 32G X 6 MM  MISC, USE AS DIRECTED ONCE DAILY EVERY EVENING, Disp: 100 each, Rfl: 3   Insulin  Pen Needle 32G X 4 MM MISC, 1 Device by Does not apply route daily in the afternoon., Disp: 100 each, Rfl: 3   magnesium 30 MG tablet, Take 30 mg by mouth once a week., Disp: , Rfl:    meloxicam  (MOBIC ) 7.5 MG tablet, TAKE 1 TABLET(7.5 MG) BY MOUTH DAILY, Disp: 30 tablet, Rfl: 0   methocarbamol  (ROBAXIN ) 500 MG tablet, Take 1 tablet (500 mg total) by mouth 2 (two) times daily., Disp: 20 tablet, Rfl: 0   Multiple Vitamins-Minerals (MULTIVITAMIN WITH MINERALS) tablet, Take 1 tablet by mouth daily., Disp: , Rfl:     pioglitazone  (ACTOS ) 30 MG tablet, Take 1 tablet (30 mg total) by mouth daily., Disp: 90 tablet, Rfl: 3   potassium chloride  (KLOR-CON  M) 10 MEQ tablet, TAKE 1 TABLET BY MOUTH EVERY MORNING, Disp: 30 tablet, Rfl: 1   VASCEPA  1 g capsule, TAKE 2 CAPSULES(2 GRAMS) BY MOUTH TWICE DAILY (Patient not taking: Reported on 11/07/2023), Disp: 120 capsule, Rfl: 3   No Known Allergies   Review of Systems  Constitutional: Negative.   Respiratory: Negative.    Cardiovascular: Negative.   Gastrointestinal: Negative.   Endocrine: Negative for polydipsia, polyphagia and polyuria.  Neurological: Negative.      Today's Vitals   11/07/23 1051 11/07/23 1132  BP: (!) 150/60 130/60  Pulse: 79   Temp: 98.4 F (36.9 C)   TempSrc: Oral   Weight: 129 lb 9.6 oz (58.8 kg)   Height: 5' 2 (1.575 m)   PainSc: 8    PainLoc: Ankle    Body mass index is 23.7 kg/m.  Wt Readings from Last 3 Encounters:  11/07/23 129 lb 9.6 oz (58.8 kg)  08/07/23 125 lb (56.7 kg)  06/06/23 126 lb 9.6 oz (57.4 kg)      Objective:  Physical Exam Vitals and nursing note reviewed.  Constitutional:      General: She is not in acute distress.    Appearance: Normal appearance. She is well-developed and normal weight.  Cardiovascular:     Rate and Rhythm: Normal rate and regular rhythm.     Pulses: Normal pulses.     Heart sounds: Normal heart sounds. No murmur heard. Pulmonary:     Effort: Pulmonary effort is normal. No respiratory distress.     Breath sounds: Normal breath sounds. No wheezing.  Musculoskeletal:        General: No swelling or tenderness.     Right hand: Normal range of motion. Normal sensation.     Left hand: Normal range of motion. Normal sensation.  Skin:    General: Skin is warm and dry.     Capillary Refill: Capillary refill takes less than 2 seconds.  Neurological:     General: No focal deficit present.     Mental Status: She is alert and oriented to person, place, and time.     Cranial Nerves:  No cranial nerve deficit.     Motor: No weakness.     Comments: Positive tinel's worse on left   Psychiatric:        Mood and Affect: Mood normal. Mood is not anxious.        Behavior: Behavior normal. Behavior is not agitated.        Thought Content: Thought content normal.        Judgment: Judgment normal.         Assessment And Plan:  Mixed hyperlipidemia Assessment & Plan: Chronic, stable.  Continue statin, tolerating well.  Orders: -     Lipid panel  Uncontrolled type 2 diabetes mellitus with hyperglycemia (HCC) Assessment & Plan: On Lantus , Jardiance , and pioglitazone . Discontinued glipizide  due to adverse effects. Endocrinologist follow-up recommended in December. - Ensure follow-up with endocrinologist in December.  Orders: -     Hemoglobin A1c  Essential hypertension Assessment & Plan: Blood pressure is controlled.  Advised to continue current medications.  Also limit intake of salt.  She is encouraged to focus on lifestyle modifications.   Need for influenza vaccination Assessment & Plan: Influenza vaccine administered Encouraged to take Tylenol  as needed for fever or muscle aches.   Orders: -     Flu vaccine HIGH DOSE PF(Fluzone Trivalent)  Numbness and tingling in both hands Assessment & Plan: Daily numbness and tingling in left hand and arm, occasional in right. Possible carpal tunnel syndrome or diabetic neuropathy. - Refer to hand specialist for evaluation. - Consider EMG nerve conduction study. - Discuss potential steroid injections, caution advised due to blood sugar impact.  Orders: -     BMP8+eGFR -     Vitamin B12 -     Ambulatory referral to Hand Surgery  Tobacco abuse Assessment & Plan: Smoking less than four cigarettes daily, reduced due to pain. - Encourage continued reduction with goal of cessation.    Return for keep same next, needs AWV.  Patient was given opportunity to ask questions. Patient verbalized understanding of the  plan and was able to repeat key elements of the plan. All questions were answered to their satisfaction.    LILLETTE Gaines Ada, FNP, have reviewed all documentation for this visit. The documentation on 11/07/23 for the exam, diagnosis, procedures, and orders are all accurate and complete.   IF YOU HAVE BEEN REFERRED TO A SPECIALIST, IT MAY TAKE 1-2 WEEKS TO SCHEDULE/PROCESS THE REFERRAL. IF YOU HAVE NOT HEARD FROM US /SPECIALIST IN TWO WEEKS, PLEASE GIVE US  A CALL AT [phone].

## 2023-11-08 LAB — LIPID PANEL
Chol/HDL Ratio: 2.3 ratio (ref 0.0–4.4)
Cholesterol, Total: 143 mg/dL (ref 100–199)
HDL: 61 mg/dL (ref >39)
LDL Chol Calc (NIH): 63 mg/dL (ref 0–99)
Triglycerides: 103 mg/dL (ref 0–149)
VLDL Cholesterol Cal: 19 mg/dL (ref 5–40)

## 2023-11-08 LAB — VITAMIN B12: Vitamin B-12: 441 pg/mL (ref 232–1245)

## 2023-11-08 LAB — BMP8+EGFR
BUN/Creatinine Ratio: 22 (ref 12–28)
BUN: 15 mg/dL (ref 8–27)
CO2: 21 mmol/L (ref 20–29)
Calcium: 9.8 mg/dL (ref 8.7–10.3)
Chloride: 105 mmol/L (ref 96–106)
Creatinine, Ser: 0.67 mg/dL (ref 0.57–1.00)
Glucose: 116 mg/dL — ABNORMAL HIGH (ref 70–99)
Potassium: 4.2 mmol/L (ref 3.5–5.2)
Sodium: 144 mmol/L (ref 134–144)
eGFR: 91 mL/min/1.73 (ref >59)

## 2023-11-08 LAB — HEMOGLOBIN A1C
Est. average glucose Bld gHb Est-mCnc: 192 mg/dL
Hgb A1c MFr Bld: 8.3 % — ABNORMAL HIGH (ref 4.8–5.6)

## 2023-11-09 ENCOUNTER — Ambulatory Visit: Payer: Self-pay | Admitting: Nurse Practitioner

## 2023-11-09 DIAGNOSIS — R200 Anesthesia of skin: Secondary | ICD-10-CM | POA: Insufficient documentation

## 2023-11-09 DIAGNOSIS — Z23 Encounter for immunization: Secondary | ICD-10-CM | POA: Insufficient documentation

## 2023-11-09 NOTE — Assessment & Plan Note (Signed)
 Smoking less than four cigarettes daily, reduced due to pain. - Encourage continued reduction with goal of cessation.

## 2023-11-09 NOTE — Assessment & Plan Note (Signed)
 Blood pressure is controlled.  Advised to continue current medications.  Also limit intake of salt.  She is encouraged to focus on lifestyle modifications.

## 2023-11-09 NOTE — Assessment & Plan Note (Signed)
Chronic, stable. Continue statin, tolerating well.

## 2023-11-09 NOTE — Assessment & Plan Note (Signed)
 Influenza vaccine administered Encouraged to take Tylenol as needed for fever or muscle aches.

## 2023-11-09 NOTE — Assessment & Plan Note (Signed)
 Daily numbness and tingling in left hand and arm, occasional in right. Possible carpal tunnel syndrome or diabetic neuropathy. - Refer to hand specialist for evaluation. - Consider EMG nerve conduction study. - Discuss potential steroid injections, caution advised due to blood sugar impact.

## 2023-11-09 NOTE — Assessment & Plan Note (Signed)
 On Lantus , Jardiance , and pioglitazone . Discontinued glipizide  due to adverse effects. Endocrinologist follow-up recommended in December. - Ensure follow-up with endocrinologist in December.

## 2023-11-12 ENCOUNTER — Encounter: Payer: Self-pay | Admitting: Radiology

## 2023-11-14 DIAGNOSIS — M79641 Pain in right hand: Secondary | ICD-10-CM | POA: Diagnosis not present

## 2023-11-14 DIAGNOSIS — G5603 Carpal tunnel syndrome, bilateral upper limbs: Secondary | ICD-10-CM | POA: Diagnosis not present

## 2023-11-14 DIAGNOSIS — M79642 Pain in left hand: Secondary | ICD-10-CM | POA: Diagnosis not present

## 2023-11-17 DIAGNOSIS — G5603 Carpal tunnel syndrome, bilateral upper limbs: Secondary | ICD-10-CM | POA: Diagnosis not present

## 2023-11-22 ENCOUNTER — Ambulatory Visit: Attending: Podiatry

## 2023-11-22 ENCOUNTER — Other Ambulatory Visit: Payer: Self-pay

## 2023-11-22 DIAGNOSIS — M6281 Muscle weakness (generalized): Secondary | ICD-10-CM | POA: Diagnosis not present

## 2023-11-22 DIAGNOSIS — R2689 Other abnormalities of gait and mobility: Secondary | ICD-10-CM | POA: Insufficient documentation

## 2023-11-22 DIAGNOSIS — M25571 Pain in right ankle and joints of right foot: Secondary | ICD-10-CM | POA: Diagnosis not present

## 2023-11-22 DIAGNOSIS — M7671 Peroneal tendinitis, right leg: Secondary | ICD-10-CM | POA: Insufficient documentation
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
## 2023-11-23 ENCOUNTER — Ambulatory Visit: Payer: Self-pay

## 2023-11-23 VITALS — BP 130/60 | HR 79 | Temp 98.4°F | Ht 62.0 in | Wt 129.0 lb

## 2023-11-23 DIAGNOSIS — Z0000 Encounter for general adult medical examination without abnormal findings: Secondary | ICD-10-CM

## 2023-11-23 DIAGNOSIS — Z0001 Encounter for general adult medical examination with abnormal findings: Secondary | ICD-10-CM | POA: Diagnosis not present

## 2023-11-23 DIAGNOSIS — F17210 Nicotine dependence, cigarettes, uncomplicated: Secondary | ICD-10-CM | POA: Diagnosis not present

## 2023-11-23 DIAGNOSIS — E1165 Type 2 diabetes mellitus with hyperglycemia: Secondary | ICD-10-CM

## 2023-11-23 DIAGNOSIS — I10 Essential (primary) hypertension: Secondary | ICD-10-CM

## 2023-11-23 DIAGNOSIS — E782 Mixed hyperlipidemia: Secondary | ICD-10-CM

## 2023-11-23 DIAGNOSIS — Z720 Tobacco use: Secondary | ICD-10-CM
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
## 2023-11-23 NOTE — Patient Instructions (Addendum)
 Ms. Brandel,  Thank you for taking the time for your Medicare Wellness Visit. I appreciate your continued commitment to your health goals. Please review the care plan we discussed, and feel free to reach out if I can assist you further.  Please note that Annual Wellness Visits do not include a physical exam. Some assessments may be limited, especially if the visit was conducted virtually. If needed, we may recommend an in-person follow-up with your provider.  Ongoing Care Seeing your primary care provider every 3 to 6 months helps us  monitor your health and provide consistent, personalized care.   Referrals If a referral was made during today's visit and you haven't received any updates within two weeks, please contact the referred provider directly to check on the status.  Recommended Screenings:  Health Maintenance  Topic Date Due   DEXA scan (bone density measurement)  Never done   DTaP/Tdap/Td vaccine (3 - Td or Tdap) 07/30/2023   COVID-19 Vaccine (7 - 2025-26 season) 09/10/2023   Complete foot exam   11/23/2023   Eye exam for diabetics  11/28/2023   Hemoglobin A1C  05/07/2024   Yearly kidney health urinalysis for diabetes  06/05/2024   Yearly kidney function blood test for diabetes  11/06/2024   Medicare Annual Wellness Visit  11/22/2024   Pneumococcal Vaccine for age over 66  Completed   Flu Shot  Completed   Hepatitis C Screening  Completed   Zoster (Shingles) Vaccine  Completed   Meningitis B Vaccine  Aged Out   Breast Cancer Screening  Discontinued       11/23/2023   10:59 AM  Advanced Directives  Does Patient Have a Medical Advance Directive? No  Would patient like information on creating a medical advance directive? No - Patient declined    Vision: Annual vision screenings are recommended for early detection of glaucoma, cataracts, and diabetic retinopathy. These exams can also reveal signs of chronic conditions such as diabetes and high blood pressure.  Dental:  Annual dental screenings help detect early signs of oral cancer, gum disease, and other conditions linked to overall health, including heart disease and diabetes.  Please see the attached documents for additional preventive care recommendations.

## 2023-11-26 DIAGNOSIS — G5601 Carpal tunnel syndrome, right upper limb: Secondary | ICD-10-CM | POA: Diagnosis not present

## 2023-11-26 DIAGNOSIS — G5603 Carpal tunnel syndrome, bilateral upper limbs: Secondary | ICD-10-CM | POA: Diagnosis not present

## 2023-11-29 ENCOUNTER — Telehealth: Payer: Self-pay | Admitting: Physical Therapy

## 2023-11-30 ENCOUNTER — Ambulatory Visit: Admitting: Physical Therapy

## 2023-12-04 ENCOUNTER — Other Ambulatory Visit: Payer: Self-pay

## 2023-12-10 ENCOUNTER — Ambulatory Visit: Attending: Podiatry
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
## 2023-12-12 ENCOUNTER — Ambulatory Visit

## 2023-12-17 ENCOUNTER — Ambulatory Visit: Admitting: Physical Therapy

## 2023-12-19 ENCOUNTER — Ambulatory Visit

## 2023-12-27 ENCOUNTER — Ambulatory Visit

## 2024-01-07 ENCOUNTER — Encounter: Payer: Self-pay | Admitting: Podiatry

## 2024-01-07 ENCOUNTER — Ambulatory Visit (INDEPENDENT_AMBULATORY_CARE_PROVIDER_SITE_OTHER): Admitting: Podiatry

## 2024-01-07 DIAGNOSIS — M7671 Peroneal tendinitis, right leg: Secondary | ICD-10-CM | POA: Diagnosis not present

## 2024-01-07 DIAGNOSIS — Q6671 Congenital pes cavus, right foot: Secondary | ICD-10-CM | POA: Diagnosis not present

## 2024-01-07 NOTE — Progress Notes (Signed)
"  °  Subjective:  Patient ID: Gabriella Acosta, female    DOB: 10-Jul-1945,   MRN: [id number]  Chief Complaint  Patient presents with   Foot Pain    It's no better.    78 y.o. female presents for follow-up of right peroneal tendinitis.  Relates the ankle pain has not improved.  She has been in PT but has only done 2 sessions so far.  She has continued in the boot which helps when she is in the boot but anytime she is out of the boot she gets pain back.  Patient is diabetic and last A1c was  Lab Results  Component Value Date   HGBA1C 8.3 (H) 11/07/2023   .   PCP:  [name], FNP    . Denies any other pedal complaints. Denies n/v/f/c.   Past Medical History:  Diagnosis Date   Diabetes mellitus without complication (HCC)    Hypertension    Type 2 diabetes mellitus (HCC) 07/22/2020    Objective:  Physical Exam: Vascular: DP/PT pulses 2/4 bilateral. CFT <3 seconds. Normal hair growth on digits. No edema.  Skin. No lacerations or abrasions bilateral feet.  Musculoskeletal: MMT 5/5 bilateral lower extremities in DF, PF, Inversion and Eversion. Deceased ROM in DF of ankle joint. Pes cavus noted severe ont he right foot. Tender to distal peroneal tendon just proximal to insertion and some pain tracking proximally along the tendon. Some mild tenderness to the ATFL and CFL areas as well.  Neurological: Sensation intact to light touch.   Assessment:   1. Peroneal tendonitis, right   2. Pes cavus of right foot        Plan:  Patient was evaluated and treated and all questions answered. X-rays reviewed and discussed with patient. No acute fractures or dislocations noted. Severe pes cavus noted.  Discussed peroneal tendinitis and treatment options at length with patient Continue stretching.  Continue physical therapy Continue diclofenac  as needed sparingly. Continue with cam boot as needed Discussed long-term looking into an [place]  brace.  Referral to [facility] provided. Discussed  that if the symptoms do not improve can consider PT/MRI. Patient to return in 6 to 8 weeks or sooner if symptoms fail to improve or worsen.   Asberry Failing, DPM    "

## 2024-01-22 ENCOUNTER — Other Ambulatory Visit: Payer: Self-pay | Admitting: Nurse Practitioner

## 2024-01-22 ENCOUNTER — Other Ambulatory Visit: Payer: Self-pay | Admitting: Podiatry

## 2024-01-22 DIAGNOSIS — G8929 Other chronic pain: Secondary | ICD-10-CM

## 2024-01-31 ENCOUNTER — Telehealth: Payer: Self-pay | Admitting: Pharmacist

## 2024-01-31 DIAGNOSIS — E782 Mixed hyperlipidemia: Secondary | ICD-10-CM

## 2024-01-31 DIAGNOSIS — E785 Hyperlipidemia, unspecified: Secondary | ICD-10-CM

## 2024-01-31 MED ORDER — ATORVASTATIN CALCIUM 80 MG PO TABS: ORAL_TABLET | ORAL | 2 refills | Status: AC

## 2024-01-31 NOTE — Progress Notes (Signed)
" ° °  01/31/2024 Name: Gabriella Acosta MRN: [id number] DOB: 1945/05/14  Chief Complaint  Patient presents with   Medication Adherence    Atorvastatin    Pharmacy Quality Measure Review  This patient is appearing on a report for being at risk of failing the adherence measure for cholesterol (statin) medications this calendar year.   Medication: Atorvastatin  80 mg  Last fill date: 01/26 for 30 day supply  Will collaborate with provider to facilitate refill needs.  Refill for 100 day supply sent to [facility].   Cassius DOROTHA Brought, PharmD, BCACP Clinical Pharmacist [phone]  "

## 2024-03-11 ENCOUNTER — Ambulatory Visit: Admitting: Podiatry

## 2024-04-21 ENCOUNTER — Encounter: Payer: Self-pay | Admitting: Nurse Practitioner
